# Patient Record
Sex: Female | Born: 1935 | Race: White | Hispanic: No | Smoking: Never smoker
Health system: Southern US, Community
[De-identification: ages and names within clinical notes are randomized; demographics above are authoritative.]

## PROBLEM LIST (undated history)

## (undated) DIAGNOSIS — I350 Nonrheumatic aortic (valve) stenosis: Secondary | ICD-10-CM

## (undated) DIAGNOSIS — R55 Syncope and collapse: Secondary | ICD-10-CM

## (undated) DIAGNOSIS — S72009A Fracture of unspecified part of neck of unspecified femur, initial encounter for closed fracture: Secondary | ICD-10-CM

## (undated) DIAGNOSIS — G609 Hereditary and idiopathic neuropathy, unspecified: Secondary | ICD-10-CM

## (undated) DIAGNOSIS — W19XXXA Unspecified fall, initial encounter: Secondary | ICD-10-CM

## (undated) DIAGNOSIS — F411 Generalized anxiety disorder: Secondary | ICD-10-CM

## (undated) DIAGNOSIS — R001 Bradycardia, unspecified: Secondary | ICD-10-CM

## (undated) DIAGNOSIS — K5900 Constipation, unspecified: Secondary | ICD-10-CM

## (undated) DIAGNOSIS — H547 Unspecified visual loss: Secondary | ICD-10-CM

## (undated) DIAGNOSIS — S72109A Unspecified trochanteric fracture of unspecified femur, initial encounter for closed fracture: Secondary | ICD-10-CM

## (undated) DIAGNOSIS — R5381 Other malaise: Secondary | ICD-10-CM

## (undated) DIAGNOSIS — I6529 Occlusion and stenosis of unspecified carotid artery: Secondary | ICD-10-CM

## (undated) DIAGNOSIS — N390 Urinary tract infection, site not specified: Secondary | ICD-10-CM

## (undated) DIAGNOSIS — F0390 Unspecified dementia without behavioral disturbance: Secondary | ICD-10-CM

## (undated) DIAGNOSIS — R5383 Other fatigue: Secondary | ICD-10-CM

## (undated) DIAGNOSIS — S72112A Displaced fracture of greater trochanter of left femur, initial encounter for closed fracture: Secondary | ICD-10-CM

## (undated) DIAGNOSIS — I10 Essential (primary) hypertension: Secondary | ICD-10-CM

## (undated) DIAGNOSIS — M810 Age-related osteoporosis without current pathological fracture: Secondary | ICD-10-CM

## (undated) DIAGNOSIS — E113419 Type 2 diabetes mellitus with severe nonproliferative diabetic retinopathy with macular edema, unspecified eye: Secondary | ICD-10-CM

## (undated) DIAGNOSIS — K625 Hemorrhage of anus and rectum: Secondary | ICD-10-CM

## (undated) DIAGNOSIS — S72102A Unspecified trochanteric fracture of left femur, initial encounter for closed fracture: Secondary | ICD-10-CM

## (undated) DIAGNOSIS — I4891 Unspecified atrial fibrillation: Secondary | ICD-10-CM

## (undated) DIAGNOSIS — E039 Hypothyroidism, unspecified: Secondary | ICD-10-CM

## (undated) DIAGNOSIS — Z950 Presence of cardiac pacemaker: Secondary | ICD-10-CM

## (undated) DIAGNOSIS — R627 Adult failure to thrive: Secondary | ICD-10-CM

## (undated) DIAGNOSIS — H548 Legal blindness, as defined in USA: Secondary | ICD-10-CM

## (undated) DIAGNOSIS — Z9181 History of falling: Secondary | ICD-10-CM

## (undated) DIAGNOSIS — R142 Eructation: Secondary | ICD-10-CM

## (undated) DIAGNOSIS — E785 Hyperlipidemia, unspecified: Secondary | ICD-10-CM

## (undated) DIAGNOSIS — F329 Major depressive disorder, single episode, unspecified: Secondary | ICD-10-CM

## (undated) DIAGNOSIS — K117 Disturbances of salivary secretion: Secondary | ICD-10-CM

## (undated) DIAGNOSIS — I495 Sick sinus syndrome: Secondary | ICD-10-CM

## (undated) DIAGNOSIS — S32009A Unspecified fracture of unspecified lumbar vertebra, initial encounter for closed fracture: Secondary | ICD-10-CM

## (undated) DIAGNOSIS — R634 Abnormal weight loss: Secondary | ICD-10-CM

## (undated) DIAGNOSIS — N6019 Diffuse cystic mastopathy of unspecified breast: Secondary | ICD-10-CM

## (undated) DIAGNOSIS — E507 Other ocular manifestations of vitamin A deficiency: Secondary | ICD-10-CM

## (undated) DIAGNOSIS — I341 Nonrheumatic mitral (valve) prolapse: Secondary | ICD-10-CM

## (undated) DIAGNOSIS — R143 Flatulence: Secondary | ICD-10-CM

## (undated) DIAGNOSIS — R269 Unspecified abnormalities of gait and mobility: Secondary | ICD-10-CM

## (undated) DIAGNOSIS — H3530 Unspecified macular degeneration: Secondary | ICD-10-CM

## (undated) DIAGNOSIS — R609 Edema, unspecified: Secondary | ICD-10-CM

## (undated) DIAGNOSIS — R32 Unspecified urinary incontinence: Secondary | ICD-10-CM

## (undated) DIAGNOSIS — R141 Gas pain: Secondary | ICD-10-CM

## (undated) HISTORY — DX: Unspecified urinary incontinence: R32

## (undated) HISTORY — DX: Bradycardia, unspecified: R00.1

## (undated) HISTORY — DX: Syncope and collapse: R55

## (undated) HISTORY — DX: Major depressive disorder, single episode, unspecified: F32.9

## (undated) HISTORY — DX: Unspecified abnormalities of gait and mobility: R26.9

## (undated) HISTORY — DX: Unspecified atrial fibrillation: I48.91

## (undated) HISTORY — DX: Displaced fracture of greater trochanter of left femur, initial encounter for closed fracture: S72.112A

## (undated) HISTORY — DX: Hyperlipidemia, unspecified: E78.5

## (undated) HISTORY — DX: Disturbances of salivary secretion: K11.7

## (undated) HISTORY — PX: TONSILLECTOMY: SHX5217

## (undated) HISTORY — DX: Unspecified dementia without behavioral disturbance: F03.90

## (undated) HISTORY — DX: Hypothyroidism, unspecified: E03.9

## (undated) HISTORY — DX: Other ocular manifestations of vitamin A deficiency: E50.7

## (undated) HISTORY — DX: Unspecified visual loss: H54.7

## (undated) HISTORY — DX: History of falling: Z91.81

## (undated) HISTORY — DX: Occlusion and stenosis of unspecified carotid artery: I65.29

## (undated) HISTORY — DX: Edema, unspecified: R60.9

## (undated) HISTORY — DX: Hemorrhage of anus and rectum: K62.5

## (undated) HISTORY — DX: Unspecified trochanteric fracture of left femur, initial encounter for closed fracture: S72.102A

## (undated) HISTORY — DX: Unspecified macular degeneration: H35.30

## (undated) HISTORY — DX: Diffuse cystic mastopathy of unspecified breast: N60.19

## (undated) HISTORY — DX: Other malaise: R53.81

## (undated) HISTORY — DX: Unspecified fall, initial encounter: W19.XXXA

## (undated) HISTORY — DX: Legal blindness, as defined in USA: H54.8

## (undated) HISTORY — DX: Unspecified fracture of unspecified lumbar vertebra, initial encounter for closed fracture: S32.009A

## (undated) HISTORY — DX: Other fatigue: R53.83

## (undated) HISTORY — DX: Sick sinus syndrome: I49.5

## (undated) HISTORY — DX: Essential (primary) hypertension: I10

## (undated) HISTORY — DX: Presence of cardiac pacemaker: Z95.0

## (undated) HISTORY — DX: Generalized anxiety disorder: F41.1

## (undated) HISTORY — DX: Abnormal weight loss: R63.4

## (undated) HISTORY — DX: Unspecified trochanteric fracture of unspecified femur, initial encounter for closed fracture: S72.109A

## (undated) HISTORY — DX: Gas pain: R14.1

## (undated) HISTORY — DX: Adult failure to thrive: R62.7

## (undated) HISTORY — DX: Type 2 diabetes mellitus with severe nonproliferative diabetic retinopathy with macular edema, unspecified eye: E11.3419

## (undated) HISTORY — DX: Urinary tract infection, site not specified: N39.0

## (undated) HISTORY — DX: Flatulence: R14.3

## (undated) HISTORY — DX: Eructation: R14.2

## (undated) HISTORY — DX: Age-related osteoporosis without current pathological fracture: M81.0

## (undated) HISTORY — DX: Nonrheumatic aortic (valve) stenosis: I35.0

## (undated) HISTORY — DX: Nonrheumatic mitral (valve) prolapse: I34.1

## (undated) HISTORY — DX: Constipation, unspecified: K59.00

## (undated) HISTORY — DX: Hereditary and idiopathic neuropathy, unspecified: G60.9

## (undated) HISTORY — DX: Fracture of unspecified part of neck of unspecified femur, initial encounter for closed fracture: S72.009A

---

## 1962-08-01 HISTORY — PX: TOTAL ABDOMINAL HYSTERECTOMY W/ BILATERAL SALPINGOOPHORECTOMY: SHX83

## 1968-08-01 HISTORY — PX: BREAST BIOPSY: SHX20

## 1999-03-20 DIAGNOSIS — H548 Legal blindness, as defined in USA: Secondary | ICD-10-CM

## 1999-03-20 HISTORY — DX: Legal blindness, as defined in USA: H54.8

## 2000-12-13 DIAGNOSIS — M810 Age-related osteoporosis without current pathological fracture: Secondary | ICD-10-CM

## 2000-12-13 HISTORY — DX: Age-related osteoporosis without current pathological fracture: M81.0

## 2001-12-12 DIAGNOSIS — H3530 Unspecified macular degeneration: Secondary | ICD-10-CM

## 2001-12-12 HISTORY — DX: Unspecified macular degeneration: H35.30

## 2002-11-05 ENCOUNTER — Emergency Department (HOSPITAL_COMMUNITY): Admission: EM | Admit: 2002-11-05 | Discharge: 2002-11-05 | Payer: Self-pay | Admitting: Emergency Medicine

## 2002-11-05 ENCOUNTER — Encounter: Payer: Self-pay | Admitting: Emergency Medicine

## 2003-08-02 HISTORY — PX: OTHER SURGICAL HISTORY: SHX169

## 2003-08-08 ENCOUNTER — Inpatient Hospital Stay (HOSPITAL_COMMUNITY): Admission: EM | Admit: 2003-08-08 | Discharge: 2003-08-13 | Payer: Self-pay | Admitting: Emergency Medicine

## 2003-08-18 DIAGNOSIS — R609 Edema, unspecified: Secondary | ICD-10-CM

## 2003-08-18 HISTORY — DX: Edema, unspecified: R60.9

## 2003-09-08 DIAGNOSIS — R269 Unspecified abnormalities of gait and mobility: Secondary | ICD-10-CM

## 2003-09-08 HISTORY — DX: Unspecified abnormalities of gait and mobility: R26.9

## 2004-06-10 ENCOUNTER — Ambulatory Visit: Payer: Self-pay | Admitting: Cardiology

## 2004-08-01 HISTORY — PX: PACEMAKER INSERTION: SHX728

## 2004-08-05 ENCOUNTER — Ambulatory Visit: Payer: Self-pay | Admitting: Internal Medicine

## 2004-08-18 ENCOUNTER — Ambulatory Visit (HOSPITAL_COMMUNITY): Admission: RE | Admit: 2004-08-18 | Discharge: 2004-08-19 | Payer: Self-pay | Admitting: Internal Medicine

## 2004-08-18 ENCOUNTER — Ambulatory Visit: Payer: Self-pay | Admitting: Internal Medicine

## 2004-09-01 ENCOUNTER — Ambulatory Visit: Payer: Self-pay | Admitting: Internal Medicine

## 2004-09-01 ENCOUNTER — Ambulatory Visit: Payer: Self-pay

## 2004-09-12 DIAGNOSIS — Z950 Presence of cardiac pacemaker: Secondary | ICD-10-CM

## 2004-09-12 HISTORY — DX: Presence of cardiac pacemaker: Z95.0

## 2004-12-08 ENCOUNTER — Ambulatory Visit: Payer: Self-pay | Admitting: Internal Medicine

## 2005-02-22 DIAGNOSIS — R32 Unspecified urinary incontinence: Secondary | ICD-10-CM

## 2005-02-22 HISTORY — DX: Unspecified urinary incontinence: R32

## 2005-09-08 ENCOUNTER — Ambulatory Visit: Payer: Self-pay

## 2005-09-26 ENCOUNTER — Ambulatory Visit: Payer: Self-pay | Admitting: Internal Medicine

## 2005-09-29 HISTORY — PX: OTHER SURGICAL HISTORY: SHX169

## 2005-10-29 ENCOUNTER — Inpatient Hospital Stay (HOSPITAL_COMMUNITY): Admission: EM | Admit: 2005-10-29 | Discharge: 2005-11-02 | Payer: Self-pay | Admitting: Emergency Medicine

## 2007-05-16 ENCOUNTER — Ambulatory Visit: Payer: Self-pay | Admitting: Internal Medicine

## 2007-09-13 ENCOUNTER — Ambulatory Visit: Payer: Self-pay

## 2007-10-19 ENCOUNTER — Ambulatory Visit: Payer: Self-pay | Admitting: Gastroenterology

## 2007-10-29 ENCOUNTER — Ambulatory Visit: Payer: Self-pay | Admitting: Gastroenterology

## 2007-10-29 LAB — CONVERTED CEMR LAB
ALT: 21 U/L
AST: 29 U/L
Albumin: 3.9 g/dL
Alkaline Phosphatase: 64 U/L
BUN: 16 mg/dL
Basophils Absolute: 0.1 K/uL
Basophils Relative: 0.7 %
Bilirubin, Direct: 0.1 mg/dL
CO2: 33 meq/L — ABNORMAL HIGH
Calcium: 9.5 mg/dL
Chloride: 103 meq/L
Creatinine, Ser: 0.8 mg/dL
Eosinophils Absolute: 0.2 K/uL
Eosinophils Relative: 2.1 %
GFR calc Af Amer: 87 mL/min
GFR calc non Af Amer: 72 mL/min
Glucose, Bld: 155 mg/dL — ABNORMAL HIGH
HCT: 45.1 %
Hemoglobin: 14.7 g/dL
Lymphocytes Relative: 20.8 %
MCHC: 32.6 g/dL
MCV: 90.1 fL
Monocytes Absolute: 0.5 K/uL
Monocytes Relative: 6.2 %
Neutro Abs: 5.1 K/uL
Neutrophils Relative %: 70.2 %
Platelets: 277 K/uL
Potassium: 3.7 meq/L
RBC: 5 M/uL
RDW: 11.8 %
Sodium: 143 meq/L
Total Bilirubin: 0.9 mg/dL
Total Protein: 7.2 g/dL
WBC: 7.4 10*3/microliter

## 2007-11-14 ENCOUNTER — Ambulatory Visit: Payer: Self-pay | Admitting: Gastroenterology

## 2007-11-28 DIAGNOSIS — R269 Unspecified abnormalities of gait and mobility: Secondary | ICD-10-CM

## 2007-12-06 DIAGNOSIS — R5381 Other malaise: Secondary | ICD-10-CM

## 2007-12-06 HISTORY — DX: Other malaise: R53.81

## 2007-12-12 ENCOUNTER — Ambulatory Visit: Payer: Self-pay | Admitting: Internal Medicine

## 2007-12-19 DIAGNOSIS — K649 Unspecified hemorrhoids: Secondary | ICD-10-CM | POA: Insufficient documentation

## 2007-12-19 DIAGNOSIS — E039 Hypothyroidism, unspecified: Secondary | ICD-10-CM

## 2007-12-19 DIAGNOSIS — E113419 Type 2 diabetes mellitus with severe nonproliferative diabetic retinopathy with macular edema, unspecified eye: Secondary | ICD-10-CM

## 2007-12-19 DIAGNOSIS — S72009A Fracture of unspecified part of neck of unspecified femur, initial encounter for closed fracture: Secondary | ICD-10-CM | POA: Insufficient documentation

## 2007-12-19 DIAGNOSIS — E785 Hyperlipidemia, unspecified: Secondary | ICD-10-CM | POA: Insufficient documentation

## 2007-12-19 DIAGNOSIS — I10 Essential (primary) hypertension: Secondary | ICD-10-CM

## 2007-12-19 DIAGNOSIS — H547 Unspecified visual loss: Secondary | ICD-10-CM

## 2007-12-19 HISTORY — DX: Type 2 diabetes mellitus with severe nonproliferative diabetic retinopathy with macular edema, unspecified eye: E11.3419

## 2007-12-19 HISTORY — DX: Hypothyroidism, unspecified: E03.9

## 2007-12-21 ENCOUNTER — Encounter (INDEPENDENT_AMBULATORY_CARE_PROVIDER_SITE_OTHER): Payer: Self-pay | Admitting: Emergency Medicine

## 2007-12-21 ENCOUNTER — Ambulatory Visit: Payer: Self-pay | Admitting: Surgery

## 2007-12-21 ENCOUNTER — Ambulatory Visit: Payer: Self-pay | Admitting: Gastroenterology

## 2007-12-21 ENCOUNTER — Ambulatory Visit: Payer: Self-pay | Admitting: Internal Medicine

## 2007-12-21 ENCOUNTER — Inpatient Hospital Stay (HOSPITAL_COMMUNITY): Admission: EM | Admit: 2007-12-21 | Discharge: 2007-12-23 | Payer: Self-pay | Admitting: Emergency Medicine

## 2007-12-21 DIAGNOSIS — K625 Hemorrhage of anus and rectum: Secondary | ICD-10-CM

## 2007-12-25 ENCOUNTER — Telehealth (INDEPENDENT_AMBULATORY_CARE_PROVIDER_SITE_OTHER): Payer: Self-pay | Admitting: *Deleted

## 2007-12-27 DIAGNOSIS — R55 Syncope and collapse: Secondary | ICD-10-CM

## 2007-12-27 DIAGNOSIS — I6529 Occlusion and stenosis of unspecified carotid artery: Secondary | ICD-10-CM

## 2007-12-27 HISTORY — DX: Syncope and collapse: R55

## 2007-12-27 HISTORY — DX: Occlusion and stenosis of unspecified carotid artery: I65.29

## 2007-12-31 ENCOUNTER — Telehealth: Payer: Self-pay | Admitting: Gastroenterology

## 2008-01-22 ENCOUNTER — Ambulatory Visit: Payer: Self-pay | Admitting: Gastroenterology

## 2008-03-12 ENCOUNTER — Ambulatory Visit: Payer: Self-pay | Admitting: Internal Medicine

## 2008-06-11 ENCOUNTER — Ambulatory Visit: Payer: Self-pay | Admitting: Internal Medicine

## 2008-09-12 ENCOUNTER — Ambulatory Visit: Payer: Self-pay | Admitting: Internal Medicine

## 2008-11-07 ENCOUNTER — Encounter: Payer: Self-pay | Admitting: Internal Medicine

## 2008-12-10 ENCOUNTER — Ambulatory Visit: Payer: Self-pay | Admitting: Internal Medicine

## 2008-12-19 ENCOUNTER — Encounter: Payer: Self-pay | Admitting: Internal Medicine

## 2009-01-20 ENCOUNTER — Encounter: Payer: Self-pay | Admitting: Gastroenterology

## 2009-01-29 DIAGNOSIS — Z9181 History of falling: Secondary | ICD-10-CM

## 2009-01-29 HISTORY — DX: History of falling: Z91.81

## 2009-03-11 ENCOUNTER — Ambulatory Visit: Payer: Self-pay | Admitting: Internal Medicine

## 2009-03-16 ENCOUNTER — Encounter: Payer: Self-pay | Admitting: Internal Medicine

## 2009-06-10 ENCOUNTER — Ambulatory Visit: Payer: Self-pay | Admitting: Internal Medicine

## 2009-06-24 DIAGNOSIS — S32009A Unspecified fracture of unspecified lumbar vertebra, initial encounter for closed fracture: Secondary | ICD-10-CM

## 2009-06-24 HISTORY — DX: Unspecified fracture of unspecified lumbar vertebra, initial encounter for closed fracture: S32.009A

## 2009-06-26 ENCOUNTER — Encounter: Payer: Self-pay | Admitting: Internal Medicine

## 2009-07-13 ENCOUNTER — Encounter: Payer: Self-pay | Admitting: Internal Medicine

## 2009-07-14 ENCOUNTER — Ambulatory Visit: Payer: Self-pay | Admitting: Internal Medicine

## 2009-07-14 DIAGNOSIS — I4891 Unspecified atrial fibrillation: Secondary | ICD-10-CM

## 2009-07-14 HISTORY — DX: Unspecified atrial fibrillation: I48.91

## 2009-09-01 ENCOUNTER — Telehealth: Payer: Self-pay | Admitting: Gastroenterology

## 2009-09-04 ENCOUNTER — Telehealth: Payer: Self-pay | Admitting: Gastroenterology

## 2009-10-14 ENCOUNTER — Ambulatory Visit: Payer: Self-pay | Admitting: Internal Medicine

## 2009-10-27 ENCOUNTER — Encounter: Payer: Self-pay | Admitting: Internal Medicine

## 2009-12-03 ENCOUNTER — Encounter: Payer: Self-pay | Admitting: Gastroenterology

## 2009-12-03 DIAGNOSIS — R141 Gas pain: Secondary | ICD-10-CM

## 2009-12-03 HISTORY — DX: Gas pain: R14.1

## 2009-12-23 DIAGNOSIS — N6019 Diffuse cystic mastopathy of unspecified breast: Secondary | ICD-10-CM

## 2009-12-23 HISTORY — DX: Diffuse cystic mastopathy of unspecified breast: N60.19

## 2010-01-13 ENCOUNTER — Ambulatory Visit: Payer: Self-pay | Admitting: Internal Medicine

## 2010-02-11 ENCOUNTER — Encounter: Payer: Self-pay | Admitting: Internal Medicine

## 2010-03-18 DIAGNOSIS — E039 Hypothyroidism, unspecified: Secondary | ICD-10-CM

## 2010-03-18 HISTORY — DX: Hypothyroidism, unspecified: E03.9

## 2010-04-15 ENCOUNTER — Ambulatory Visit: Payer: Self-pay | Admitting: Internal Medicine

## 2010-05-12 ENCOUNTER — Encounter: Payer: Self-pay | Admitting: Internal Medicine

## 2010-07-22 ENCOUNTER — Ambulatory Visit: Payer: Self-pay | Admitting: Internal Medicine

## 2010-07-22 ENCOUNTER — Encounter: Payer: Self-pay | Admitting: Internal Medicine

## 2010-08-06 ENCOUNTER — Ambulatory Visit
Admission: RE | Admit: 2010-08-06 | Discharge: 2010-08-06 | Payer: Self-pay | Source: Home / Self Care | Attending: Gastroenterology | Admitting: Gastroenterology

## 2010-08-19 DIAGNOSIS — F411 Generalized anxiety disorder: Secondary | ICD-10-CM

## 2010-08-19 HISTORY — DX: Generalized anxiety disorder: F41.1

## 2010-08-24 ENCOUNTER — Encounter
Admission: RE | Admit: 2010-08-24 | Discharge: 2010-08-24 | Payer: Self-pay | Source: Home / Self Care | Attending: Internal Medicine | Admitting: Internal Medicine

## 2010-08-31 NOTE — Progress Notes (Signed)
Summary: Medication  Medications Added ANALPRAM-HC 1-2.5 % CREA (HYDROCORTISONE ACE-PRAMOXINE) apply 1-2 times per day to rectum       Phone Note Call from Patient Call back at Home Phone [phone]   Caller: Patient Call For: Dr. [name] Reason for Call: Talk to Nurse Summary of Call: Pt did not want the Hydrocortisone that was called in. She is wanting Analpram for her hemorroids. Initial call taken by: Karna Christmas,  September 04, 2009 3:37 PM  Follow-up for Phone Call        analpram sent pt aware Follow-up by: Chales Abrahams CMA Duncan Dull),  September 04, 2009 3:49 PM    New/Updated Medications: ANALPRAM-HC 1-2.5 % CREA (HYDROCORTISONE ACE-PRAMOXINE) apply 1-2 times per day to rectum Prescriptions: ANALPRAM-HC 1-2.5 % CREA (HYDROCORTISONE ACE-PRAMOXINE) apply 1-2 times per day to rectum  #1 tube x 6   Entered by:   Chales Abrahams CMA (AAMA)   Authorized by:   Rachael Fee MD   Signed by:   Chales Abrahams CMA (AAMA) on 09/04/2009   Method used:   Electronically to        [facility]* (retail)       [address]       [place], [place]  [id number]       Ph: [phone]       Fax: [phone]   RxID:   [id number]

## 2010-08-31 NOTE — Letter (Signed)
Summary: [facility]   Imported By: Lanelle Bal 12/11/2009 09:04:37  _____________________________________________________________________  External Attachment:    Type:   Image     Comment:   External Document

## 2010-08-31 NOTE — Letter (Signed)
Summary: Remote Device Check  [facility], Main Office  [address]   [place], [place] [address]   Phone: [phone]  Fax: [phone]     October 27, 2009 MRN: [id number]   [facility] [address] [place], [place]  [address]   Dear Ms. Tantillo,   Your remote transmission was recieved and reviewed by your physician.  All diagnostics were within normal limits for you.  __X___Your next transmission is scheduled for:  January 13, 2010.  Please transmit at any time this day.  If you have a wireless device your transmission will be sent automatically.     Sincerely,  Proofreader

## 2010-08-31 NOTE — Progress Notes (Signed)
Summary: Medication Refill   Phone Note Call from Patient Call back at Home Phone [phone]   Caller: Patient Call For: Dr. [name] Reason for Call: Refill Medication Summary of Call: Pt is needing her Analpram refilled Initial call taken by: Karna Christmas,  September 01, 2009 10:37 AM    Prescriptions: HYDROCORTISONE 2.5 % RECTAL CREA (HYDROCORTISONE) apply to rectum 2 times daily  #30 gm x 2   Entered by:   Chales Abrahams CMA (AAMA)   Authorized by:   Rachael Fee MD   Signed by:   Chales Abrahams CMA (AAMA) on 09/01/2009   Method used:   Electronically to        [facility]* (retail)       [address]       [place], [place]  [id number]       Ph: [phone]       Fax: [phone]   RxID:   [id number]

## 2010-08-31 NOTE — Letter (Signed)
Summary: Remote Device Check  [facility], Main Office  [address]   [place], [place] [address]   Phone: [phone]  Fax: [phone]     May 12, 2010 MRN: [id number]   [facility] [place] [address] [place], [place]  [address]   Dear Ms. Cotugno,   Your remote transmission was recieved and reviewed by your physician.  All diagnostics were within normal limits for you.   __X____Your next office visit is scheduled for:  December 2011 with Dr [name]. Please call our office to schedule an appointment.    Sincerely,  Vella Kohler

## 2010-08-31 NOTE — Cardiovascular Report (Signed)
Summary: Office Visit Remote   Office Visit Remote   Imported By: Roderic Ovens 05/13/2010 11:04:00  _____________________________________________________________________  External Attachment:    Type:   Image     Comment:   External Document

## 2010-08-31 NOTE — Cardiovascular Report (Signed)
Summary: Office Visit Remote   Office Visit Remote   Imported By: Roderic Ovens 02/15/2010 12:29:03  _____________________________________________________________________  External Attachment:    Type:   Image     Comment:   External Document

## 2010-08-31 NOTE — Cardiovascular Report (Signed)
Summary: Office Visit Remote   Office Visit Remote   Imported By: Roderic Ovens 10/29/2009 11:51:13  _____________________________________________________________________  External Attachment:    Type:   Image     Comment:   External Document

## 2010-08-31 NOTE — Letter (Signed)
Summary: Remote Device Check  [facility], Main Office  [address]   [place], [place] [address]   Phone: [phone]  Fax: [phone]     February 11, 2010 MRN: [id number]   [facility] [address] [place], [place]  [address]   Dear Ms. Doolen,   Your remote transmission was recieved and reviewed by your physician.  All diagnostics were within normal limits for you.  __X___Your next transmission is scheduled for:  04-15-2010.  Please transmit at any time this day.  If you have a wireless device your transmission will be sent automatically.    Sincerely,  Vella Kohler

## 2010-09-02 NOTE — Assessment & Plan Note (Signed)
Review of gastrointestinal problems: 1. 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 Requesting Provider: n/a Chief Complaint: abdominal pain & bloating History of Present Illness:     very pleasant 77 year old woman whom I last saw 2 1/2 years ago.    she fell backwards 2-3 nights ago, had a knot on her back.  Put ice on it.  she has intermittent pains in epig and ruq.  Also has a lot of flatus.  She stopped citrucel, changed to miralax.  Then tried gas-ex.  She normally eats a lot of fruits, veggies, milk.  a little cheese and milk, not much bread.  the bulk of her diet, calories is from fruit and vegetables  No diarrhea.  no bleeding.  no weight changes.           Current Medications (verified): 1)  Adult Aspirin Low Strength 81 Mg  Tbdp (Aspirin) .Marland Kitchen.. 1 Once Daily 2)  Zocor 10 Mg Tabs (Simvastatin) .... Take One Tablet Once Daily 3)  Toprol Xl 50 Mg Xr24h-Tab (Metoprolol Succinate) .... Take One Tablet Every Morning 4)  Multi-Vitamin Menopausal  Tabs (Multiple Vitamins-Minerals) .... Once Daily 5)  Milk of Magnesia 400 Mg/41ml Susp (Magnesium Hydroxide) .... 30 Ml Every Other Day As Needed 6)  Promensil 500 Mg Tabs (Red Clover Leaf Extract) .... 200mg  Two Times A Day 7)  Vitacel  Tabs (Multiple Vitamins-Minerals) 8)  Miralax  Powd (Polyethylene Glycol 3350) .... As Needed 9)  Preservision Areds  Caps (Multiple Vitamins-Minerals) .... Two Times A Day 10)  Black Cohosh 540 Mg Caps (Black Cohosh) .... Once Daily 11)  Klb6 3.5-25-100-40 Mg Caps (Pyridoxine-Kelp-Lect-Vinegar) .... 3 Tablets Two Times A Day 12)  Soy Protein Shake  Powd (Nutritional Supplements) .... Once Daily 13)  Trust [employer] Dha 29-1-200 & 250 Mg Misc (Prenat-Febis-Fepro-Fa-Ca-Omega) .... Once Daily 14)  Levothroid 25 Mcg Tabs (Levothyroxine  Sodium) .... 1/2 Tablet Once Daily 15)  Oxybutynin Chloride 5 Mg Tabs (Oxybutynin Chloride) .... Once Daily  Allergies: 1)  ! Pcn 2)  ! Ibuprofen 3)  ! * Llisinopril  Vital Signs:  Patient profile:   77 year old female Height:      61 inches Weight:      145 pounds BMI:     27.50 Pulse rate:   84 / minute Pulse rhythm:   regular BP sitting:   126 / 60  (left arm) Cuff size:   regular  Vitals Entered By: June McMurray CMA Duncan Dull) (August 06, 2010 1:52 PM)  Physical Exam  Additional Exam:  Constitutional: generally well appearing Psychiatric: alert and oriented times 3 Abdomen: soft, non-tender, non-distended, normal bowel sounds    Impression & Recommendations:  Problem # 1:  excessive flatulence, intermittent abdominal pains the bulk of her diet is fruits and vegetables. These are much more gas-forming then other types of food. We went through her usual daily diet and I recommended she try cutting out her usual morning prunes and prune juice. She will call the office in 2-3 weeks to report on her symptoms  Patient Instructions: 1)  Cut out prunes and prune juice from your morning meal.  Replace this with more toast in the morning. 2)  Call Dr. [name]' office in 2 weeks to report on your symptoms. 3)  The medication list was reviewed and reconciled.  All changed /  newly prescribed medications were explained.  A complete medication list was provided to the patient / caregiver.

## 2010-09-02 NOTE — Assessment & Plan Note (Signed)
Summary: F1Y MEDTRONIC  Medications Added MILK OF MAGNESIA 400 MG/5ML SUSP (MAGNESIUM HYDROXIDE) 30 ml every other day as needed PROMENSIL 500 MG TABS (RED CLOVER LEAF EXTRACT) 200mg  two times a day VITACEL  TABS (MULTIPLE VITAMINS-MINERALS)  MIRALAX  POWD (POLYETHYLENE GLYCOL 3350) as needed PRESERVISION AREDS  CAPS (MULTIPLE VITAMINS-MINERALS) two times a day BLACK COHOSH 540 MG CAPS (BLACK COHOSH) once daily KLB6 3.5-25-100-40 MG CAPS (PYRIDOXINE-KELP-LECT-VINEGAR) 3 tablets two times a day SOY PROTEIN SHAKE  POWD (NUTRITIONAL SUPPLEMENTS) once daily TRUST NATAL DHA 29-1-200 & 250 MG MISC (PRENAT-FEBIS-FEPRO-FA-CA-OMEGA) once daily LEVOTHROID 25 MCG TABS (LEVOTHYROXINE SODIUM) 1/2 tablet once daily OXYBUTYNIN CHLORIDE 5 MG TABS (OXYBUTYNIN CHLORIDE) once daily      Allergies Added:    Visit Type:  medtronic Primary Provider:  Murray Hodgkins, M.D.  CC:  shortness of breath and some dizziness.  History of Present Illness:  .  [name] is seen in followup for sinus node dysfunction chronotropic incompetence which has a pacemaker implanted.  She is doing quite well    She does have a history of a cryptogenic stroke, she does have atri al fibrillation identified by her device..  She also has macular degeneration and she is concerned about the potential of anticoagulation to affect her vision  Anticoagulation Management History:      Positive risk factors for bleeding include an age of 75 years or older and presence of serious comorbidities.  The bleeding index is 'intermediate risk'.  Positive CHADS2 values include History of HTN, Age > 75 years old, and History of Diabetes.    Problems Prior to Update: 1)  Atrial Fibrillation  (ICD-427.31) 2)  Sinus Node Dysfunction  (ICD-427.81) 3)  Pacemaker, Permanent-medtronic In-pulse E2dr01  (ICD-V45.01) 4)  Hypertension  (ICD-401.9) 5)  Other and Unspecified Hyperlipidemia  (ICD-272.4) 6)  Dm  (ICD-250.00) 7)  Unspecified  Hypothyroidism  (ICD-244.9) 8)  Hip Fracture  (ICD-820.8) 9)  Hemorrhoids  (ICD-455.6) 10)  Rectal Bleeding  (ICD-569.3)  Current Medications (verified): 1)  Adult Aspirin Low Strength 81 Mg  Tbdp (Aspirin) .Marland Kitchen.. 1 Once Daily 2)  Zocor 10 Mg Tabs (Simvastatin) .... Take One Tablet Once Daily 3)  Toprol Xl 50 Mg Xr24h-Tab (Metoprolol Succinate) .... Take One Tablet Every Morning 4)  Multi-Vitamin Menopausal  Tabs (Multiple Vitamins-Minerals) .... Once Daily 5)  Milk of Magnesia 400 Mg/14ml Susp (Magnesium Hydroxide) .... 30 Ml Every Other Day As Needed 6)  Promensil 500 Mg Tabs (Red Clover Leaf Extract) .... 200mg  Two Times A Day 7)  Vitacel  Tabs (Multiple Vitamins-Minerals) 8)  Miralax  Powd (Polyethylene Glycol 3350) .... As Needed 9)  Preservision Areds  Caps (Multiple Vitamins-Minerals) .... Two Times A Day 10)  Black Cohosh 540 Mg Caps (Black Cohosh) .... Once Daily 11)  Klb6 3.5-25-100-40 Mg Caps (Pyridoxine-Kelp-Lect-Vinegar) .... 3 Tablets Two Times A Day 12)  Soy Protein Shake  Powd (Nutritional Supplements) .... Once Daily 13)  [employer] Dha 29-1-200 & 250 Mg Misc (Prenat-Febis-Fepro-Fa-Ca-Omega) .... Once Daily 14)  Levothroid 25 Mcg Tabs (Levothyroxine Sodium) .... 1/2 Tablet Once Daily 15)  Oxybutynin Chloride 5 Mg Tabs (Oxybutynin Chloride) .... Once Daily  Allergies (verified): 1)  ! Pcn 2)  ! 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  Alcohol Use - no Drug Use  - no  Risk Factors: Smoking Status: never (07/13/2009)  Vital Signs:  Patient profile:   77 year old female Height:      61 inches Weight:      139.25 pounds BMI:     26.41 Pulse rate:   88 / minute BP sitting:   179 / 76  (left arm) Cuff size:   regular  Vitals Entered By: Caralee Ates CMA (July 22, 2010 11:45 AM)  Physical Exam  General:  The patient was alert and oriented in no acute distress. HEENT Normal.  Neck veins were flat, carotids were brisk.  Lungs were clear.  Heart sounds were regular without murmurs or gallops.  Abdomen was soft with active bowel sounds. There is no clubbing cyanosis or edema. Skin Warm and dry \\par   EKG  Procedure date:  07/22/2010  Findings:      atrial paced at 60 Intervals 0.21/0.13/0.44 Right bundle branch block Otherwise normal  PPM Specifications Following MD:  Sherryl Manges, MD     PPM Vendor:  Medtronic     PPM Model Number:  [id number]     PPM Serial Number:  [id number] PPM DOI:  08/18/2004     PPM Implanting MD:  Sherryl Manges, MD  Lead 1    Location: RA     DOI: 08/18/2004     Model #: [id number]     Serial #: [id number]     Status: active Lead 2    Location: RV     DOI: 08/18/2004     Model #: 1646T     Serial #: [id number]     Status: active  Magnet Response Rate:  BOL 85 ERI 65  Indications:  SINUS NODE DYSFUNCTION   PPM Follow Up Remote Check?  No Battery Voltage:  2.77 V     Battery Est. Longevity:  5 YEARS     Pacer Dependent:  No       PPM Device Measurements Atrium  Amplitude: 1.4 mV, Impedance: 537 ohms, Threshold: 0.375 V at 0.4 msec Right Ventricle  Amplitude: 11.20 mV, Impedance: 703 ohms, Threshold: 0.625 V at 0.4 msec  Episodes MS Episodes:  3     Percent Mode Switch:  <0.1%     Coumadin:  No Atrial Pacing:  96.2%     Ventricular Pacing:  0.3%  Parameters Mode:  DDDR     Lower Rate Limit:  60     Upper Rate Limit:  110 Paced AV Delay:  280     Sensed AV Delay:  230 Next Remote Date:  10/21/2010     Next  Cardiology Appt Due:  07/02/2011 Tech Comments:  No parameter changes. Device function normal.  3 mode switch episode with the longest 2:29 hours.  Carelink transmissions every 3 months.   ROV 1 year with Dr. [name]. Altha Harm, LPN  July 22, 2010 12:12 PM   Impression & Recommendations:  Problem # 1:  ATRIAL FIBRILLATION (ICD-427.31) thethe patient had intermittent atrial fibrillation. She has high CHADS VASC score. Her concern has been any potential deleterious impact of anticoagulation on her vision which is quite poor. I have asked her to discuss with her ophthalmologist next month his risk assessment of anticoagulation. We will make the month after that to review this.  I have noted to her that aspirin is of little benefit in the elderly this regard  and at oral anticoagulation with warfarin would be preferred from an arrhythmia point of view Her updated medication list for this problem includes:    Adult Aspirin Low Strength 81 Mg Tbdp (Aspirin) .Marland Kitchen... 1 once daily    Toprol Xl 50 Mg Xr24h-tab (Metoprolol succinate) .Marland Kitchen... Take one tablet every morning  Problem # 2:  SINUS NODE DYSFUNCTION (ICD-427.81) stable post pacing Her updated medication list for this problem includes:    Adult Aspirin Low Strength 81 Mg Tbdp (Aspirin) .Marland Kitchen... 1 once daily    Toprol Xl 50 Mg Xr24h-tab (Metoprolol succinate) .Marland Kitchen... Take one tablet every morning  Problem # 3:  HYPERTENSION (ICD-401.9) modestly elevated. We'll hold off on further medications at this point however as she be followed weekly by Dr. Chilton Si The following medications were removed from the medication list:    Hydrochlorothiazide 25 Mg Tabs (Hydrochlorothiazide) .Marland Kitchen... 1 once daily Her updated medication list for this problem includes:    Adult Aspirin Low Strength 81 Mg Tbdp (Aspirin) .Marland Kitchen... 1 once daily    Toprol Xl 50 Mg Xr24h-tab (Metoprolol succinate) .Marland Kitchen... Take one tablet every morning  Problem # 4:  PACEMAKER, PERMANENT-[employer]TR[employer]IC  IN-PULSE E2DR01 (ICD-V45.01) Device parameters and data were reviewed and no changes were made  Anticoagulation Management Assessment/Plan:            Patient Instructions: 1)  Your physician recommends that you continue on your current medications as directed. Please refer to the Current Medication list given to you today. 2)  Your physician recommends that you schedule a follow-up appointment in: 2 months to discuss Coumadin

## 2010-09-27 ENCOUNTER — Encounter: Payer: Self-pay | Admitting: Internal Medicine

## 2010-09-27 ENCOUNTER — Encounter (INDEPENDENT_AMBULATORY_CARE_PROVIDER_SITE_OTHER): Payer: Medicare Other | Admitting: Internal Medicine

## 2010-09-27 DIAGNOSIS — I495 Sick sinus syndrome: Secondary | ICD-10-CM

## 2010-09-27 DIAGNOSIS — I4891 Unspecified atrial fibrillation: Secondary | ICD-10-CM

## 2010-09-27 DIAGNOSIS — I10 Essential (primary) hypertension: Secondary | ICD-10-CM

## 2010-10-07 ENCOUNTER — Telehealth: Payer: Self-pay | Admitting: Internal Medicine

## 2010-10-07 NOTE — Cardiovascular Report (Signed)
Summary: Office Visit   Office Visit   Imported By: Roderic Ovens 09/30/2010 16:10:30  _____________________________________________________________________  External Attachment:    Type:   Image     Comment:   External Document

## 2010-10-07 NOTE — Assessment & Plan Note (Signed)
Summary: f54m  Medications Added FISH OIL   OIL (FISH OIL) once daily SERTRALINE HCL 25 MG TABS (SERTRALINE HCL) once daily CLINDAMYCIN HCL 150 MG CAPS (CLINDAMYCIN HCL) 4 caps prior to dental appts      Allergies Added:   Visit Type:  Follow-up Primary Provider:  Murray Hodgkins, M.D.  CC:  no complaints.  History of Present Illness:  .  [name] is seen in followup for sinus node dysfunction chronotropic incompetence which has a pacemaker implanted.  She is doing quite well    She does have a history of a cryptogenic stroke, she does have atri al fibrillation identified by her device..  She also has macular degeneration and she is concerned about the potential of anticoagulation to affect her vision; she spoke with her ophthalmologist who felt that anticoagulation would put her other eye at significantly increased risk of loss of vision. She is blind in one eye  and her good eye is 20/800;  She had a fall a few weeks ago. She underwent scanning at the hospital that was apparently normal  Current Medications (verified): 1)  Adult Aspirin Low Strength 81 Mg  Tbdp (Aspirin) .Marland Kitchen.. 1 Once Daily 2)  Zocor 10 Mg Tabs (Simvastatin) .... Take One Tablet Once Daily 3)  Toprol Xl 50 Mg Xr24h-Tab (Metoprolol Succinate) .... Take One Tablet Every Morning 4)  Milk of Magnesia 400 Mg/77ml Susp (Magnesium Hydroxide) .... 30 Ml Every Other Day As Needed 5)  Promensil 500 Mg Tabs (Red Clover Leaf Extract) .... 200mg  Two Times A Day 6)  Vitacel  Tabs (Multiple Vitamins-Minerals) 7)  Miralax  Powd (Polyethylene Glycol 3350) .... As Needed 8)  Preservision Areds  Caps (Multiple Vitamins-Minerals) .... Two Times A Day 9)  Black Cohosh 540 Mg Caps (Black Cohosh) .... Once Daily 10)  Klb6 3.5-25-100-40 Mg Caps (Pyridoxine-Kelp-Lect-Vinegar) .... 3 Tablets Two Times A Day 11)  Soy Protein Shake  Powd (Nutritional Supplements) .... Once Daily 12)  Levothroid 25 Mcg Tabs (Levothyroxine Sodium) .... 1/2  Tablet Once Daily 13)  Oxybutynin Chloride 5 Mg Tabs (Oxybutynin Chloride) .... Once Daily 14)  Fish Oil   Oil (Fish Oil) .... Once Daily 15)  Sertraline Hcl 25 Mg Tabs (Sertraline Hcl) .... Once Daily 16)  Clindamycin Hcl 150 Mg Caps (Clindamycin Hcl) .... 4 Caps Prior To Dental Appts  Allergies (verified): 1)  ! Pcn 2)  ! Ibuprofen 3)  ! * Llisinopril  Vital Signs:  Patient profile:   77 year old female Height:      61 inches Weight:      136 pounds BMI:     25.79 Pulse rate:   80 / minute BP sitting:   174 / 66  (left arm) Cuff size:   regular  Vitals Entered By: Hardin Negus, RMA (September 27, 2010 10:10 AM)  Physical Exam  General:  The patient was alert and oriented in no acute distress. HEENT Normal.   Lungs were clear.  Heart sounds were regular without murmurs or gallops.  Abdomen was soft  There is no clubbing cyanosis or edema. Skin Warm and dry    PPM Specifications Following MD:  Sherryl Manges, MD     PPM Vendor:  Medtronic     PPM Model Number:  [id number]     PPM Serial Number:  [id number] PPM DOI:  08/18/2004     PPM Implanting MD:  Sherryl Manges, MD  Lead 1    Location: RA     DOI: 08/18/2004  Model #: [id number]     Serial #: [id number]     Status: active Lead 2    Location: RV     DOI: 08/18/2004     Model #: [id number]     Serial #: [id number]     Status: active  Magnet Response Rate:  BOL 85 ERI 65  Indications:  SINUS NODE DYSFUNCTION   PPM Follow Up Pacer Dependent:  No      Episodes Coumadin:  No  Parameters Mode:  DDDR     Lower Rate Limit:  60     Upper Rate Limit:  110 Paced AV Delay:  280     Sensed AV Delay:  230  Impression & Recommendations:  Problem # 1:  ATRIAL FIBRILLATION (ICD-427.31) Patient has had no intercurrent atrial fibrillation. The issue of thromboembolic risk reduction is very challenging given her high risk of thromboembolism but also the high risk to her vision. I think for her the latter is paramount. I would ask her to further  inquire of her ophthalmologist about the potential risks associated with aspirin. Recent data are quite disheartening about the risk benefit of aspirin. Her updated medication list for this problem includes:    Adult Aspirin Low Strength 81 Mg Tbdp (Aspirin) .Marland Kitchen... 1 once daily    Toprol Xl 50 Mg Xr24h-tab (Metoprolol succinate) .Marland Kitchen... Take one tablet every morning  Problem # 2:  PACEMAKER, PERMANENT-MEDTRONIC IN-PULSE E2DR01 (ICD-V45.01) Device parameters and data were reviewed and no changes were made  Problem # 3:  HYPERTENSION (ICD-401.9) her blood pressure is poorly controlled. I've asked her to follow up with Dr. Chilton Si regarding augmented therapy. Her updated medication list for this problem includes:    Adult Aspirin Low Strength 81 Mg Tbdp (Aspirin) .Marland Kitchen... 1 once daily    Toprol Xl 50 Mg Xr24h-tab (Metoprolol succinate) .Marland Kitchen... Take one tablet every morning  Patient Instructions: 1)  Your physician recommends that you continue on your current medications as directed. Please refer to the Current Medication list given to you today. 2)  Your physician wants you to follow-up in:  YEAR WITH DR Despina Hidden will receive a reminder letter in the mail two months in advance. If you don't receive a letter, please call our office to schedule the follow-up appointment.

## 2010-10-19 NOTE — Progress Notes (Signed)
Summary: stop asa   Phone Note Call from Patient Call back at [facility] Phone [phone]   Caller: Patient Reason for Call: Talk to Nurse Summary of Call: can pt stop taken asa.  Initial call taken by: Lorne Skeens,  October 07, 2010 10:58 AM  Follow-up for Phone Call        Ms. Crill spoke with her eye doctor regarding ASA and he said that ASA had only a small effect regarding the scar tissue in her eyes. She is wondering if she needs to continue the baby ASA. Will forward to Dr. [name] to review since they discussed this at her last OV. Whitney Maeola Sarah RN  October 07, 2010 11:14 AM  Follow-up by: Whitney Maeola Sarah RN,  October 07, 2010 11:15 AM  Additional Follow-up for Phone Call Additional follow up Details #1::        if safe to take asa it is our only protection against stroke with her af Additional Follow-up by: Nathen May, MD, [facility],  October 13, 2010 11:33 AM     Appended Document: stop asa PT AWARE./CY

## 2010-12-14 NOTE — Assessment & Plan Note (Signed)
[facility]                         GASTROENTEROLOGY OFFICE NOTE   ZIYAN, HILLMER                        MRN:          [id number]  DATE:10/19/2007                            DOB:          [birth year]    REASON FOR REFERRAL:  Dr. Chilton Si asked me to evaluate Ms. [name] in  consultation regarding recent rectal bleeding, family history of colon  cancer.   HISTORY OF PRESENT ILLNESS:  Ms. Shinsky is a very pleasant 77 year old  woman whose mother had colon cancer.  She herself had a colonoscopy in  the 1980s and tells me that it was normal.  She has not had a  colonoscopy since then.  She has rather chronic constipation and uses  magnesium pills to move her bowels at least every other day.  She  believes if she did not take that she would probably go several days  without moving her bowels.  In the past two to three months she has  noticed red blood on the he tissue paper as well as some spotting of  blood on her underwear.  She discussed this with Dr. Chilton Si who arranged  for her to be evaluated here.  Her last CBC was in October of 2008.  That showed a normal hemoglobin.  Recent basic metabolic profile last  month was normal.   REVIEW OF SYSTEMS:  Notable for stable weight and otherwise is  essentially normal, and is available on our history intake sheet.   PAST MEDICAL HISTORY:  1. Pacemaker.  2. Bilateral hip fractures.  3. Hypothyroidism.  4. Diabetes.  5. Elevated lipids.  6. Hypertension.   CURRENT MEDICATIONS:  Hydrochlorothiazide, aspirin, potassium, vitamin C  and E, PreserVision, lecithin, __________ , calcium, magnesium, enzymes,  B12, folic acid.   ALLERGIES:  PENICILLIN AND IBUPROFEN.   SOCIAL HISTORY:  Widowed.  Lives at extended care facility.  Retired  Insurance account manager.  Nonsmoker, nondrinker.  Two children.   FAMILY HISTORY:  Mother died of colon cancer.   PHYSICAL EXAMINATION:  5 feet 2 inches, 138 pounds.  Blood  pressure  134/62, pulse 68.  CONSTITUTIONAL:  Generally well appearing.  NEUROLOGIC:  Alert and oriented x3.  EYES:  She is legally blind but has good sense of macro vision.  MOUTH:  Oropharynx moist, no lesions.  NECK:  Supple.  No lymphadenopathy.  CARDIOVASCULAR:  Heart regular rate and rhythm.  LUNGS:  Clear to auscultation bilaterally.  ABDOMEN:  Soft, nontender, nondistended.  Normal bowel sounds.  EXTREMITIES:  No lower extremity edema.  SKIN:  No rashes or lesions or visible extremities.   ASSESSMENT/PLAN:  77 year old woman with family history of colon  cancer, recent rectal bleeding.   First, I think it is reasonable we proceed with a full colonoscopy at  her soonest convenience.  She does not appear to be anemic clinically,  but I will arrange for her also to have a CBC done to check to see if  that is indeed the case as she has not had a CBC since she began  bleeding.  I see no reason for  any further imaging studies or labs other  than that mentioned above.     Rachael Fee, MD  Electronically Signed    DPJ/MedQ  DD: 10/19/2007  DT: 10/19/2007  Job #: [id number]   cc:   [name], M.D.

## 2010-12-14 NOTE — Assessment & Plan Note (Signed)
[facility]                            CARDIOLOGY OFFICE NOTE   NAME:Farley, Crystal HOFFMEISTER                        MRN:          [id number]  DATE:05/16/2007                            DOB:          1915/07/25    [name] is status post pacemaker implantation for bradycardia and  chronotropic incompetence.  Her major complaint today is shortness of  breath with exertion, which has been stable or so over the last year or  two.  It is unaccompanied by chest pain.  She also has episodes where  she becomes abruptly dyspneic.  This lasts for just a second or two and  then passes.   Her medications are reviewed, and they are unchanged.   PHYSICAL EXAMINATION:  Blood pressure 145/59.  LUNGS:  Clear.  CARDIAC:  Heart sounds were regular.  EXTREMITIES:  Without edema.  SKIN:  Warm and dry.   Interrogation of her Medtronic pulse generator demonstrates a P wave of  1.4 with an impedance of 561, a threshold of 0.37 at 0.4.  The R wave  was 11 with an impedance of 764 and a threshold of 0.5 at 0.4.  She does  have approximately 0.5%  PVCs.   IMPRESSION:  1. Sinus node dysfunction with chronotropic incompetence.  2. Status post pacemaker for the above.  3. Dyspneic episodes that are brief, likely related to the premature      ventricular contractions.  4. Macular degeneration.   [name] is stable.  We will plan to see her again in one year's time.     Duke Salvia, MD, [facility]  Electronically Signed    SCK/MedQ  DD: 05/16/2007  DT: 05/17/2007  Job #: [id number]

## 2010-12-14 NOTE — Discharge Summary (Signed)
[name], [name]                  ACCOUNT NO.:  [id number]   MEDICAL RECORD NO.:  [id number]          PATIENT TYPE:  INP   LOCATION:  3703                         FACILITY:  [facility]   PHYSICIAN:  [name], M.D.    DATE OF BIRTH:  01-17-1915   DATE OF ADMISSION:  12/21/2007  DATE OF DISCHARGE:  12/23/2007                               DISCHARGE SUMMARY   DISCHARGE DIAGNOSES:  1. Right-sided paresthesias and presyncope.  2. Mild carotid artery stenosis with 40-60% proximal right internal      carotid artery stenosis and loss of diastolic flow of the right      vertebral artery per carotid Doppler study.  No internal carotid      artery stenosis on the left.  3. Uncontrolled hypertension.  4. Hyperlipidemia.  5. History of bradycardia, status post pacemaker in January 2006.  6. Limited code blue.   MEDICATIONS:  1. Zocor 5 mg q.h.s.  2. Norvasc 2.5 mg q.h.s.  3. Toprol-XL 25 mg each morning.  4. Hydrochlorothiazide 25 mg each morning.  5. Potassium chloride 20 mEq daily.  6. Multivitamin with iron once daily.  7. Calcium with vitamin D one tablet b.i.d.  8. Aspirin 81 mg daily.   CONSULTATIONS:  Pacemaker technician.   PROCEDURE PERFORMED:  1. Dual-chamber pacemaker check on Dec 21, 2007.  All program values      within normal limits.  2. CT scan of the head without contrast on Dec 21, 2007.  The results      revealed no intracranial hemorrhage.  Small-vessel-disease type      changes.  No CT evidence of large acute infarct.  Opacification of      the right sphenoid sinus air cell with air-fluid level, possibly      representing acute sinusitis.  3. Chest x-ray on Dec 21, 2007.  The results revealed no acute      cardiopulmonary process.  4. A 2-D echocardiogram performed on Dec 21, 2007.  The results      revealed an ejection fraction estimated to be 60-70%.  No      abnormalities with the exception of mild tricuspid valvular      regurgitation.  5. Carotid Doppler  study on Dec 21, 2007.  The results revealed      bilateral mild heterogeneous plaque noted at the bifurcation      extending into the proximal ICA and ECA.  Right 40-60% proximal ICA      stenosis.  Bilateral tortuosity of the mid to distal ICA and ECA.      Loss of diastolic flow in the right vertebral artery consistent      with a more distal obstruction.  Vertebral artery flow antegrade      bilaterally.  No significant left ICA stenosis noted.   HISTORY OF PRESENT ILLNESS:  The patient is a 77 year old woman with a  past medical history significant for status post pacemaker,  hypertension, hyperlipidemia, and legal blindness secondary to macular  degeneration.  She presented to the emergency department with a chief  complaint of right-sided  numbness, particularly of the face and arm, and  feelings as if she was going to pass out.  This episode occurred as she  was leaving Dr. Christella Farley' office.  It lasted for a few minutes.  Apparently, one of the nurses took her blood pressure during this  episode and the systolic blood pressure was above 190.  When the patient  presented to the emergency department, she was alert and oriented and  neurologically intact.  Her blood pressure was 169/66.  Her EKG revealed  normal sinus rhythm with a heart rate of 60 beats per minute and a right  bundle branch block.  The CT scan of her head revealed no acute  intracranial abnormalities.  The patient was admitted for further  evaluation and management.   For additional details, please see the dictated history and physical.   HOSPITAL COURSE:  1. Right-sided paresthesias/presyncope.  The patient was      neurologically intact at the time of the initial hospital      assessment.  She was hemodynamically stable although she was mildly      to moderately hypertensive.  The CT scan of her head revealed no      acute intracranial findings.  However, there was evidence of      chronic sinusitis.  The patient  gave no complaints of facial pain,      rhinorrhea, or subjective fever and chills.  Her urinalysis      revealed a few wbc's.  However, the patient had no complaints of      painful urination or urinary hesitancy/frequency.  The patient was      restarted on aspirin at 81 mg daily as well as hydrochlorothiazide      and potassium supplementation.  For further evaluation, a 2-D      echocardiogram, carotid Dopplers, TSH, vitamin B12, cardiac      enzymes, and a fasting lipid panel were ordered.  The patient's 2-D      echocardiogram revealed preserved LV function and no significant      abnormalities.  Her carotid Doppler study revealed 40-60% stenosis      of the right ICA and a loss of diastolic flow in the right      vertebral artery.  There was no significant ICA stenosis on the      left.  Her cardiac enzymes were within normal limits.  Her TSH was      within normal limits at 2.507.  Her vitamin B12 was greater than      2000.  Her fasting lipid panel revealed a total cholesterol of 179,      triglycerides of 205, HDL of 36, and LDL of 102.  The patient was      started on Zocor 5 mg each night.  The results of the studies were      discussed with the patient and the patient's son.  The patient was      given the option of medical treatment for the mild carotid stenosis      versus further evaluation with a vascular surgeon.  The patient did      not want aggressive intervention, particularly surgery and/or a      carotid endarterectomy given her age.  I agreed with the patient in      that it does not appear that there is an immediate intervention      needed beyond medical therapy.  The patient is treated chronically  with aspirin.  Starting Plavix was entertained.  However, it was      felt that adding Zocor and attempting to control the patient's      blood pressure better would probably decrease her symptoms and      improve mortality.  Because the patient's blood pressure  was      somewhat uncontrolled during the hospitalization, Toprol-XL and      Norvasc were added.  The patient was evaluated by the physical and      occupational therapists.  On the second hospital day, when she      ambulated with the therapists it was noted that her systolic blood      pressure went up to approximately 200.  The patient became      symptomatic, complaining of right arm numbness.  When she sat down,      her blood pressure was rechecked and it was recorded as 151/64.      Her symptoms quickly resolved when she sat down.  Orthostatic vital      signs were ordered and revealed that the patient was not      orthostatic.  It is unclear whether or not the hypertension with      ambulation is the cause or the effect of the patient's right-sided      paresthesias.  Nevertheless, the patient's hypertension was a      concern.  As stated above, Toprol-XL and Norvasc were added to      hydrochlorothiazide for better blood pressure management.  Today,      the patient ambulated with the nursing staff.  She had no      complaints of dizziness, right-sided facial numbness, or right arm      numbness.  Her blood pressure today following ambulation was      154/71.  The patient will therefore be discharged to home on a      combination of Zocor, hydrochlorothiazide, Toprol-XL, Norvasc, and      potassium chloride supplementation.  The decision to start Plavix      will be deferred to Dr. Chilton Si.  Of note, the patient was advised to      maintain antiplatelet therapy with aspirin.  2. Status post pacemaker in January 2006.  The patient's pacemaker was      interrogated.  There were no      abnormalities.  The patient's heart rate ranged from 60-86 beats      per minute during the hospital course.  3. Diet-controlled diabetes mellitus.  The patient's capillary blood      glucose was well controlled during the hospital course.  Her      hemoglobin A1c was 6.1.      [name],  M.D.  Electronically Signed     DF/MEDQ  D:  12/23/2007  T:  12/23/2007  Job:  [id number]   cc:   [name], M.D.  Duke Salvia, MD, [facility]

## 2010-12-14 NOTE — H&P (Signed)
[name], MONTANTE NO.:  [id number]   MEDICAL RECORD NO.:  [id number]          PATIENT TYPE:  EMS   LOCATION:  MAJO                         FACILITY:  [facility]   PHYSICIAN:  Elliot Cousin, M.D.    DATE OF BIRTH:  1915/07/16   DATE OF ADMISSION:  12/21/2007  DATE OF DISCHARGE:                              HISTORY & PHYSICAL   PRIMARY CARE PHYSICIAN:  [name], MD.   PRIMARY CARDIOLOGIST:  Duke Salvia, MD.   CHIEF COMPLAINT:  Right-sided facial numbness, right sided arm numbness,  I almost passed out.   HISTORY OF PRESENT ILLNESS:  The patient is a 77 year old woman with a  past medical history significant for hypertension, hyperlipidemia and  status post pacemaker.  She was visiting Dr. [name] today for follow-up  of a recent colonoscopy, which apparently revealed internal and external  hemorrhoids.  The patient was in her usual state of health; however,  when she left his office to walk down the hall to leave, she felt  strange.  The right side of her face became numb, followed by right arm  numbness.  She had no numbness of her right leg or the left side of her  body.  She began to walk; however, she thought that she was going to  pass out.  Her daughter-in-law was close by and as the patient was  beginning to sit in a chair, she almost fell.  The patient's daughter-in-  law braced the patient's fall and was able to get her into the chair  without any trauma.  There was no complete loss of consciousness.  The  patient says that she was aware of her surroundings at the time;  however, she just felt strange.  The patient also states that over the  past 4-5 weeks she has had a number of episodes when she felt as if she  was going to pass out but she did not.  She became lightheaded and off  balanced transiently became more alert and stronger shortly thereafter.  She has had a mild left-sided intermittent headache which she describes  as no big deal.   She is legally blind; however, she has not had any  worsening vision and no double vision.  She denies outright weakness on  the right side.  She denies any bladder incontinence or fecal  incontinence.  She denies any recent palpitations, chest pain or  shortness of breath.  She has not had any nausea or vomiting, fever,  chills or abdominal pain.   During the evaluation in the emergency department the patient is noted  to be hemodynamically stable although her blood pressure is moderately  elevated at 169/66.  CT scan of her head reveals no acute intracranial  findings although there is evidence of opacification of the right  sphenoid sinus.  Her lab data are virtually unremarkable.  Her EKG  reveals normal sinus rhythm with a heart rate of 60 beats per minute and  a right bundle branch block.  The patient will be admitted for further  evaluation and management.  PAST MEDICAL HISTORY:  1. Rectal bleeding secondary to internal and external hemorrhoids per      colonoscopy in April 2009 by Dr. [name].  2. Status post pacemaker in January 2006 by Dr. [name] secondary to      bradycardia.  3. Hypertension.  4. Hyperlipidemia (no pharmacological treatment yet).  5. History of mitral valve prolapse.  6. Prediabetes.  7. Legal blindness secondary to macular degeneration.  8. Hearing loss.  The patient wears hearing aids bilaterally.  9. Status post ORIF of the left hip in March 2007 by Dr. [name].  10.Status post right hip surgery in January 2005 by Dr. [name].  11.Status post hysterectomy and bilateral salpingo-oophorectomy at 77      years of age.   MEDICATIONS:  1. Hydrochlorothiazide 25 mg daily.  2. Potassium chloride (unknown dose) daily.  3. Multivitamin once daily.  4. Gingko supplement once daily.  5. Lecithin supplement once daily.  6. Red clover supplement once daily.  7. Black cohosh supplement once daily.  8. Cod liver oil once or twice daily.  9. Calcium with  vitamin D twice daily.  10.Aspirin 81 mg daily.   ALLERGIES:  The patient has allergies to IBUPROFEN and PENICILLINS.   SOCIAL HISTORY:  The patient is widowed.  She lives at [facility]  [place] (independent living).  She ambulates with a walker.  She has  two children.  She is a retired Runner, broadcasting/film/video.  She has no history of tobacco  or alcohol use.   FAMILY HISTORY:  Noncontributory secondary to her age.   REVIEW OF SYSTEMS:  The patient's review of systems is positive for  occasional lightheadedness as if she is going to pass out, difficulty  hearing, difficulty seeing, constipation.  Otherwise review of systems  is negative.   EXAM:  Temperature 97.7, blood pressure 169/66, pulse 69, respiratory  rate 18, oxygen saturation 94% on room air.  GENERAL:  The patient is a pleasant, alert, youthful-appearing 77-year-  old Caucasian woman who is currently sitting up in bed in no acute  distress.  HEENT:  Head is normocephalic, nontraumatic.  Pupils equal, round and  reactive to light.  Extraocular muscles are intact.  Conjunctivae are  clear.  Sclerae are white.  Tympanic membranes not examined.  There are,  however, bilateral hearing aids, not removed.  Nasal mucosa is dry.  No  sinus tenderness.  Oropharynx reveals fairly good dentition.  Mucous  membranes are mildly dry.  No posterior exudates or erythema.  NECK:  Supple.  No adenopathy, no thyromegaly, no bruit,  no JVD.  LUNGS:  Clear to auscultation bilaterally.  HEART:  S1 and S2 with a 2/6 systolic murmur.  CHEST WALL:  There is a palpable left upper chest wall pacemaker,  nontender.  ABDOMEN:  Positive bowel sounds, soft, nontender, nondistended.  No  hepatosplenomegaly, no masses palpated.  EXTREMITIES:  Pedal pulses 2+ bilaterally.  No pretibial edema and no  pedal edema.  GU:  Deferred.  RECTAL:  Deferred.  NEUROLOGIC:  The patient is alert and oriented x3.  Cranial nerves II-  XII are intact with the exception of a  decrease in hearing acuity and a  decrease in gross vision secondary to macular degeneration.  Gag reflex  is present.  Facial sensation is intact.  No facial droop.  Strength in  the sitting position is 5/5 of the upper and lower extremities  symmetrically.  No pronator drift.  Plantar reflexes questionable  downgoing bilaterally.  Sensation is grossly intact of the upper and  lower extremities.  Cerebellar with finger-to-nose intact.  Gait not  assessed.   ADMISSION LABORATORY DATA:  EKG reveals normal sinus rhythm with a heart  rate of 60 beats per minute and a right bundle branch block.  Question  presence of pacemaker spikes.  CT scan of the head reveals no  intracranial hemorrhage.  Small vessel disease-type changes.  No CT  evidence of large acute infarct.  Opacification of the right sphenoid  sinus air cell with air fluid-level, possibly representing acute  sinusitis.  Chest x-ray reveals no acute cardiopulmonary process.  Urinalysis negative except small leukocytes.  Sodium 140, potassium 3.6,  chloride 101, CO2 31, glucose 142, BUN 17, creatinine 0.73, total  bilirubin 0.4, alkaline phosphatase 78, SGOT 26, SGPT 19, total protein  6.8, albumin 3.8, calcium 9.5.  PTT 26, PT 12.4.  WBC 8.1, hemoglobin  14.1, platelets 268.   ASSESSMENT:  1. Right-sided paresthesias associated with presyncope.  The      differential diagnoses include transient ischemic attack, acute      resolving stroke, arrhythmia, orthostatic hypotension.  2. Mild pyuria without dysuria.  The patient has no complaints of      painful urination.  She is currently afebrile and her white blood      cell count is within normal limits.  3. Possible right sphenoid sinusitis.  The patient denies facial pain,      rhinorrhea or fever and chills.  Therefore, antibiotic therapy will      not be started.  4. Status post pacemaker secondary to bradycardia.  I do not detect      pacemaker spikes; however, Dr. [name] or  his colleagues will be      consulted to evaluate the patient's pacemaker.  5. Hypertension.  The patient's blood pressure is moderately elevated.      She is chronically treated with hydrochlorothiazide.  Would rather      monitor the patient's blood pressure over the next 24-48 hours      before modifying antihypertensive therapy.  6. Prediabetes.  The patient is treated with diet and lifestyle      changes alone.  Her venous glucose is currently 142.   PLAN:  1. The patient will be admitted for further evaluation and management.  2. Cannot obtain an MRI of the brain because of the pacemaker.      Therefore, will check bilateral carotid Dopplers, cardiac enzymes      and a 2-D echocardiogram.  3. We will also assess the patient's fasting lipid profile, thyroid      function and vitamin B12.  4. We will check orthostatic vital signs.  5. We will ask [facility] to assess the patient's pacemaker.  6. We will continue hydrochlorothiazide and potassium chloride.  We      will watch the patient's blood pressure for the next 1-2 days and      the assess whether or not she needs additional treatment.  7. We will start gentle IV fluids.      Elliot Cousin, M.D.  Electronically Signed     DF/MEDQ  D:  12/21/2007  T:  12/21/2007  Job:  [id number]   cc:   [name], M.D.  Rachael Fee, MD  Duke Salvia, MD, [facility]

## 2010-12-14 NOTE — Assessment & Plan Note (Signed)
[facility]                         ELECTROPHYSIOLOGY OFFICE NOTE   DAYLEN, HACK                        MRN:          [id number]  DATE:09/12/2008                            DOB:          [birth year]    Ms. [name] is seen in followup for bradycardia and chronotropic  incompetence.  She is status post pacemaker implantation.  Her ER  intercurrently has been notable for stroke that occurred last summer.  Interestingly, she has had no episodes of atrial fibrillation on her  pacemaker interrogation.  She was noted to be hypertensive and her  medications were adjusted (see below).  It was attributed to a right-  sided ICA stenosis in the setting of a left-sided cerebral perfusion  defects.  There are some discordance there.  I am not quite sure  understand the mechanism of her stroke.   She had no complaints of chest pain or shortness of breath.  She is  tired.  She has significant postprandial tiredness; this is particularly  after lunch which is a warm meal.   MEDICATIONS:  1. Hydrochlorothiazide 25.  2. Aspirin 81.  3. Potassium.  4. Amlodipine 2.5.  5. Metoprolol 25.  6. Simvastatin 10.   On examination, her blood pressure is 145/60 with a pulse of 69.  Her  weight was 134 which is stable.  Her lungs were clear.  Her heart sounds  were regular.  The abdomen was soft.  The extremities had no edema.   Interrogation of her Medtronic pulse generator demonstrates no atrial  fibrillation as noted.  She is 95% atrial paced.  Her P-wave was 1 with  impedance of 470, threshold 0.375 at 0.4.  The R-wave was 8 with  impendence of 766, a threshold of 0.65 at 0.4.  The battery voltage was  2.78.   IMPRESSION:  1. Sinus node dysfunction.  2. Status post pacemaker for the above.  3. Intercurrent stroke, question mechanism.  4. Hypertension with multiple medications.   I have taken the liberty of discontinuing her amlodipine and doubling  her  metoprolol.  We will get a prescription to her at [facility].  In the  event that she notices more fatigue, we can switch over the metoprolol  back to amlodipine at 5 and see how she does on that regime.   As relates to her postprandial sleepiness, this is to be related to  increase splanchnic blood flow.   I have just told her to be careful after meals.     Duke Salvia, MD, [facility]  Electronically Signed    SCK/MedQ  DD: 09/12/2008  DT: 09/13/2008  Job #: [id number]   cc:   [name], M.D.

## 2010-12-15 ENCOUNTER — Telehealth: Payer: Self-pay | Admitting: Internal Medicine

## 2010-12-17 NOTE — Discharge Summary (Signed)
NAMETIMOTHY, TOWNSEL NO.:  [id number]   MEDICAL RECORD NO.:  [id number]                   PATIENT TYPE:  INP   LOCATION:  0480                                 FACILITY:  [facility]   PHYSICIAN:  Elana Alm. [name], M.D.              DATE OF BIRTH:  [birth year]   DATE OF ADMISSION:  08/08/2003  DATE OF DISCHARGE:  08/12/2003                                 DISCHARGE SUMMARY   ADMISSION DIAGNOSES:  1. Right hip femoral neck fracture.  2. Uncontrolled hypertension.  3. Heart murmur.  4. Diet controlled diabetes.  5. Macular degeneration.   DISCHARGE DIAGNOSES:  1. Right hip femoral neck fracture status post right hemiarthroplasty.  2. Hypertension now under better control.  3. Heart murmur.  4. Diet controlled diabetes.  5. Uncontrolled macular degeneration with dry eyes.   HISTORY OF PRESENT ILLNESS:  The patient is an 77 year old female who is an  independent living resident of [facility]. She fell today at home  when the rubber of her shoe got caught on her carpet. She denies any loss of  consciousness, any dizziness and just complains of right hip pain.  Brought  to [facility] Emergency Room where x-rays showed right hip femoral neck  fracture.  Her blood pressure on admission was 220/80, after pain medicine  it was 197/78.  Dr. Elliot Gurney was consulted for medical management  and medical clearance before surgery.  He started her on Toprol XL 50 mg x1  dose and then clonidine 0.1 mg.  On August 08, 2002 the patient underwent a  right hip hemiarthroplasty bipolar prosthesis placement by Dr. [name], she  tolerated the procedure well.  Postoperatively she had a significant high  fever, temperature of 101, blood cultures were drawn, a UA was done and  chest x-ray was done.  Chest x-ray was clear, UA was clear and her blood  cultures still have no growth as of this date but they are still growing.  Physical therapy day one she was 2 plus  maximum assist bed to chair.  Postop  day two, we started Vicodin 5/500, 1 tablet q. 6h. scheduled. She was  significantly better on physical therapy postop day two ambulating 20 feet,  minimal to moderate assist. She still spiked a temperature of 102.  Her  surgical wound is well approximated, she had a small amount of serous  drainage.  Postoperative day three, the patient had been afebrile x24 hours.  Her blood pressure was 138/56, respirations 20, pulse 69.  Her CBG's had  been running between 116 and 227.  Her INR is 1.9.  She is being transferred  to the Skilled Nursing Facility at [facility] today in stable  condition.   DISCHARGE MEDICATIONS:  1. Toprol XL 50 mg, 1 tablet 1 time a day.  2. Colace 100 mg 1 tablet twice a day.  3. Senokot  1 tablet twice a day.  4. Trinsicon 1 caplet t.i.d.  5. Coumadin 5 mg alternating by 2.5 mg.  She had 2.5 mg on August 11, 2003.  6. She will discontinue her Lovenox today as she is almost therapeutic on     her Coumadin.  7. She is getting Vicodin 5/500 one tablet scheduled q. 6h.  She also can     get one tablet q. 6h. p.r.n. if she needs a second Vicodin to control her     pain.   She will need hemoglobin A1C to evaluate her diabetes. She will need CBG's  a.c. and h.s. to monitor her blood sugar to see if she needs p.o. medication  for this.  She will need to see Molly Maduro A. [name], M.D. back in the office on  August 25, 2003. Please call [phone] today to scheduled that appointment.  She is weightbearing as tolerated.  She needs to be on a diabetic diet.  She  needs to get physical therapy daily for ambulation. She needs to get  occupational therapy for bathing and dressing.  She needs to have daily  dressing changes, swab the wound with Betadine each day and apply dry 4 x  4's taped in place.  She will need to get her eye drops from her independent  living apartment so that she can have some moisture in her eyes so her  macular  degeneration does not bother her as much.     Kirstin Shepperson, P.A.                  Robert A. [name], M.D.    KS/MEDQ  D:  08/12/2003  T:  08/12/2003  Job:  [id number]   cc:   Lenon Curt Chilton Si, M.D.  [address]  [place]  [place] [address]  Fax: [phone]   [facility]

## 2010-12-17 NOTE — Discharge Summary (Signed)
[name], [name]                  ACCOUNT NO.:  [id number]   MEDICAL RECORD NO.:  [id number]          PATIENT TYPE:  OIB   LOCATION:  6531                         FACILITY:  [facility]   PHYSICIAN:  Duke Salvia, M.D.  DATE OF BIRTH:  1915-02-21   DATE OF ADMISSION:  08/18/2004  DATE OF DISCHARGE:  08/19/2004                                 DISCHARGE SUMMARY   DISCHARGE DIAGNOSES:  1.  Status post implantation of St. Jude EnPulse pacemaker.  2.  Sinus pauses.  3.  Chronotropic incompetence.  4.  Symptomatic premature ventricular contractions.  5.  History of right hip fracture after fall.  6.  Macular degeneration.  7.  Mild hypertension.  8.  Tonsillectomy.  9.  Hysterectomy.  10. Left breast biopsy.  11. Mild hypokalemia on hydrochlorothiazide.   PROCEDURE:  On August 18, 2004, implantation of St. Jude EnPulse pacemaker  for sinus node dysfunction.  The pacemaker has been interrogated post  procedure day #1, all within normal limits.  There is high impedance in the  right ventricular lead of 1049 ohms.  Chest x-ray has been examined.  Leads  are appropriate.  No pneumothorax.  The patient discharging post procedure  day #1.   DISPOSITION:  [name] is ready for discharge post procedure day #1.  She is in a pacing rhythm at the time of discharge, A pacing, V sensing.  She has no complaints.  She is alert and oriented.  She is achieving 96%  oxygen saturations on room air.  Blood pressure is 158/48 this morning.  She  is alert and oriented x3.  Her incision is healing nicely without swelling,  erythema or drainage.  Mobility issues have been discussed with the patient.  She has also been asked to keep her incision dry for the next 7 days.   DISCHARGE MEDICATIONS:  1.  Vitamin C 500 mg daily.  2.  Ocuvite two tablets daily.  3.  Os-Cal with Vitamin D 500 mg daily.  4.  Hydrochlorothiazide 25 mg daily.  5.  Potassium chloride 10 mEq daily.  This is a new medication and  she will      have a prescription for this.  6.  The patient is part of the [facility] trial and she will be seen by [facility]      research associates here at [facility] before discharge.  7.  Tylenol 325 mg one to two tablets every 4-6 hours as needed for pain.   DIET:  Low sodium, low cholesterol diet.   FOLLOW UP:  Follow-up appointment in clinic on Wednesday, September 01, 2004,  at 9:30 a.m.  She sees Dr. [name] on Wednesday, April 26, at 9:30 a.m.   HISTORY OF PRESENT ILLNESS:  The patient is an 77 year old female.  She has  a bradycardia.  This was largely asymptomatic except for exercise  intolerance.  She also may have some orthostatic intolerance.  She had  Holter monitor with a mean heart rate of 49, minimum heart rates in the  waking hours of 40.  There were a  number of pauses in the 2.5-3.0 second  range with occasional PVCs.  She also has an exercise protocol on August 05, 2004, to look at chronotropic incompetence.  She was only able to achieve a  maximum heart rate of 75 beats per minute.  This was a 65% of a chronotropic  minimum.  Now with these chronotropic incompetence and sinus node pauses, it  is thought exercise intolerance may be improved by atrial pacing.  There is  also some benefit that it may prevent pauses which may lead to falls.  The  patient will be electively admitted to [facility] on January 18.   HOSPITAL COURSE:  On admission to [facility], she underwent  successful implantation of St. Jude pacemaker by Dr. [name] that day.  She  has had no post procedure complications.  She was found to be mildly  hypokalemic on admission and started on low dose of potassium chloride.  She  has been given a prescription for this.  The patient discharges on January  19, with medications and followup as dictated.       GM/MEDQ  D:  08/19/2004  T:  08/19/2004  Job:  [id number]   cc:   [name], M.D.  [address]  [place]  [place] [address]  Fax:  [phone]   Jesse Sans. Wall, M.D.

## 2010-12-17 NOTE — Discharge Summary (Signed)
NAME:  [name], [name] NO.:  [id number]   MEDICAL RECORD NO.:  [id number]                   PATIENT TYPE:  INP   LOCATION:  0480                                 FACILITY:  [facility]   PHYSICIAN:  Kirstin Shepperson, P.A.            DATE OF BIRTH:  1914-11-19   DATE OF ADMISSION:  08/08/2003  DATE OF DISCHARGE:                                 DISCHARGE SUMMARY   ADDENDUM TO STAT DISCHARGE #244010:  Discontinue the clonidine and add  Accupril 10 mg 1 p.o. q.d. to her list of medications.                                               Julien Girt, P.A.    KS/MEDQ  D:  08/12/2003  T:  08/12/2003  Job:  [id number]

## 2010-12-17 NOTE — Op Note (Signed)
NAMEMERANDA, DECHAINE NO.:  [id number]   MEDICAL RECORD NO.:  [id number]                   PATIENT TYPE:  INP   LOCATION:  0480                                 FACILITY:  [facility]   PHYSICIAN:  Elana Alm. [name], M.D.              DATE OF BIRTH:  10-22-1914   DATE OF PROCEDURE:  08/09/2003  DATE OF DISCHARGE:                                 OPERATIVE REPORT   PREOPERATIVE DIAGNOSIS:  Right hip femoral neck fracture.   POSTOPERATIVE DIAGNOSIS:  Right hip femoral neck fracture.   PROCEDURE:  Right hip Osteonics bipolar prosthesis using #6 cemented femoral  component with +5 x 26 mm femoral head with 46 mm bipolar cup.   SURGEON:  Elana Alm. [name], M.D.   ASSISTANT:  Julien Girt, P.A.   ANESTHESIA:  General.   OPERATIVE TIME:  One hour.   ESTIMATED BLOOD LOSS:  250 mL.   COMPLICATIONS:  None.   DESCRIPTION OF PROCEDURE:  Ms. Kallstrom was brought to the operating room on  August 09, 2003, placed under general anesthesia on her own bed, and  carefully transferred to the operative table.  She was then placed in the  right lateral up decubitus position and secured on the bed with a Mark  frame.  She received vancomycin 1 g IV preoperatively for prophylaxis.  The  right hip and leg were prepped using sterile Duraprep and draped using  sterile technique.  Initially through a 15 mm posterolateral bridge  trochanteric incision, initial exposure was made.  The underlying  subcutaneous tissues were incised in line with the skin incision.  The  iliotibial band and gluteus maximus fascia was incised longitudinally  revealing the underlying sciatic nerve, which was carefully protected.  The  short external rotators of the hip and hip capsule were released off their  femoral neck insertion and tagged, and then the hip fracture was easily  identified and the femoral head was removed.  There were found to be minimal  degenerative changes.  The femoral head  measured 46 mm in diameter and a 46  mm trial was placed in the acetabulum and found to give an excellent fit.  It was then removed and the femoral neck was cut 1.5-2 cm above the lesser  trochanter in the appropriate amount of anteversion and abduction and  inclination.  Sequential axial reamers were then used to ream the femur up  to a 6.5 with broaches up to a 6.5 and with the #6 broach in place with a +5  neck length and a 46 mm bipolar head trial, the hip was reduced and taken  through a full range of motion and found to be stable and leg lengths equal.  It was then removed.  The cement plug was trialled.  A #3 was found to be  the appropriate trial, and this was then placed.  The femoral canal was then  jet lavage irrigated with 3 L of saline solution and then filled with  cement, and then the #6 femoral component with an 11 mm centralizer was  placed down the femoral canal with excess cement being removed from around  the edges.  After the cement hardened, a +5 x 28 mm head was hammered on to  the femoral neck with an excellent Morse taper fit, and then the 46 mm  bipolar cut was locked into position and then the hip reduced, taken through  a full range of motion and found to be stable and leg lengths equal.  The  wound was then irrigated and then the hip capsule and short external  rotators of the hip were reattached to their femoral neck insertion through  two drill holes in the greater trochanter.  The iliotibial band and gluteus  maximus fascia was closed with #1 Ethibond suture.  Subcutaneous tissue was  closed with 0 and 2-0 Vicryl, skin closed with skin staples, sterile  dressings were applied.  Hip abduction pillow applied.  The patient turned  supine, checked for leg lengths that were equal, rotation was equal,  vascular status was normal.  The patient then awakened, extubated, and taken  to the recovery room in stable condition.  Needle and sponge count was  correct x2 at  the end of the case.                                               Robert A. [name], M.D.    RAW/MEDQ  D:  08/09/2003  T:  08/09/2003  Job:  [id number]

## 2010-12-17 NOTE — Op Note (Signed)
[name], [name]                  ACCOUNT NO.:  [id number]   MEDICAL RECORD NO.:  [id number]          PATIENT TYPE:  OIB   LOCATION:  2899                         FACILITY:  [facility]   PHYSICIAN:  Duke Salvia, M.D.  DATE OF BIRTH:  10/27/1914   DATE OF PROCEDURE:  08/18/2004  DATE OF DISCHARGE:                                 OPERATIVE REPORT   PREOPERATIVE DIAGNOSIS:  Sinus node dysfunction with chronic tropic  incompetence.   POSTOPERATIVE DIAGNOSIS:  Sinus node dysfunction with chronic tropic  incompetence.   PROCEDURE:  Dual-chamber pacemaker implantation with contrast venography.   OPERATOR:  Duke Salvia, M.D.   DESCRIPTION OF PROCEDURE:  Following obtaining informed consent, the patient  was brought to the electrophysiology laboratory and placed on the  fluoroscopic  table in the supine position. After routine prep and drape of the left  upper chest, Lidocaine was infiltrated in the prepectoral, subclavicular  region.  Incision was made and carried down to the layer of the prepectoral  fascia using electrocautery and sharp dissection. A pocket was formed  similarly.  Hemostasis was achieved.   Thereafter, attention was turned to gaining access to the extrathoracic left  subclavian vein. This was accomplished without difficulty without the  aspiration of air or puncture of the artery, a contrast venogram having been  accomplished.  Two separate venepunctures were accomplished,  7 French sheaths were placed, and sequentially, Pacesetter passive fixation  leads, 1646T, 52 cm ventricular lead, serial #[id number], and a 1642, T-46 cm  passive fixation atrial lead, serial #[id number].   Under fluoroscopic guidance, they were manipulated into the right  ventricular apex and the right atrial appendage respectively where a bipolar  R-wave was 9.1 millivolts with a pacing impedence of 786 ohms and a  threshold of 0.5 msec and 0.4 volts. Current threshold was 0.3 MA and there  was no diaphragmatic pacing at 10 volts. The bipolar P wave was 2.7  millivolts, with a pacing impedance of 560 ohms and threshold of 0.5 volts,  and 0.5 msc, and current threshold of 1.0 MA.   With these acceptable parameters recorded, the leads were secured to the  prepectoral fascia and attached to a Medtronic In-Pulse E2DR01 pulse  generator, serial #[id number].  The leads in the pulse generator were then  placed in the pocket, secured to the prepectoral fascia. The pocket was  copiously irrigated with antibiotic containing saline solution.  Hemostasis  was assured.  The wound was closed in 3 layers in normal fashion. The wound  was  washed, dried and a Benzoin and Steri-Strip  dressing was applied.  Sponge, instrument and needle counts were correct at  the end of the procedure according to the staff. The patient tolerated the  procedure without apparent complication.       SCK/MEDQ  D:  08/18/2004  T:  08/18/2004  Job:  [id number]   cc:   electrophysiology laboratory   [facility]   [name], M.D.  [address]  [place]  [place] [address]  Fax: [phone]

## 2010-12-17 NOTE — Discharge Summary (Signed)
[name], [name]                  ACCOUNT NO.:  [id number]   MEDICAL RECORD NO.:  [id number]          PATIENT TYPE:  INP   LOCATION:  1511                         FACILITY:  [facility]   PHYSICIAN:  Dyke Brackett, M.D.    DATE OF BIRTH:  1915-08-01   DATE OF ADMISSION:  10/29/2005  DATE OF DISCHARGE:  11/02/2005                                 DISCHARGE SUMMARY   ADMITTING DIAGNOSES:  1.  Left intertrochanteric femur fracture.  2.  Hypertension.  3.  Mitral valve prolapse.  4.  History of pacemaker placement.  5.  Macular degeneration.  6.  Legally blind.  7.  Pre diabetes mellitus.   DISCHARGE DIAGNOSES:  1.  Status post left hip compression screw.  2.  Hypokalemia, resolved.  3.  Mild leukocytosis, resolved.  Patient afebrile.  4.  Acute blood loss anemia, asymptomatic.  5.  Slow progress with physical therapy.  6.  Macular degeneration.  7.  Legally blind.  8.  Hypertension.  9.  Mild mitral valve prolapse.  10. History of pacemaker placement.  11. Pre diabetes mellitus.   HISTORY OF PRESENT ILLNESS:  [name] is an 77 year old white female who  presented with left hip pain after tripping over a raised log.  Patient  landed on left side.  Denied any chest pain, shortness of breath, loss of  consciousness, or injury to her head.  Patient with immediately onset of  left leg pain.  No hypesthesia.  Patient was brought to [facility] ER where  she was found to have a left intertrochanteric femur fracture.   HOME MEDICATIONS:  1.  Multivitamin one p.o. daily.  2.  Calcium daily.  3.  Potassium 10 mEq p.o. daily.  4.  Cascara Sodara dosage unknown.  5.  Melatonin dosage unknown.  6.  Hydrochlorothiazide 25 mg p.o. daily.   SURGICAL PROCEDURE:  Patient was taken to the operating room on October 29, 2005 by Dr. [name], assisted by Arnoldo Morale, P.A.-C.  Patient was placed  under spinal anesthesia and underwent a left hip compression screw.  Patient  tolerated the procedure  well and returned to recovery in good stable  condition.   CONSULTS:  PT, OT, clinical social work, case management   HOSPITAL COURSE:  Postoperative day one patient afebrile.  Vital signs  stable.  H&H is 11.2 and 32.1.  White count was 13,200.  Patient without  shortness of breath, chest pain, calf pain.  Some left hip pain with  movement.  Postoperative day two patient denies chest pain, shortness of  breath, nausea, vomiting.  Denied any dizziness, lightheadedness.  H&H is  9.8, 29.2.  Tmax 100.8.  Vital signs stable.  White count was 9000.  Very  mild hypokalemia with a potassium of 3.4.  Patient was given 20 mEq of K-  Dur.  Postoperative day three patient without any complaints.  Tmax was  100.5.  Vital signs were stable.  H&H remained stable at 9.7 and 28.4.  Patient had no symptoms of anemia.  White count was 10,800.  Potassium had  dropped to 3.2 and therefore patient was placed on K-Dur 40 mEq p.o. b.i.d.  This was in addition to her regular scheduled potassium replacement.  Patient continued slow progress with physical therapy.  Was at [facility] assist  with transfers and ambulation.  Postoperative day four patient afebrile.  Vital signs stable.  H&H is stable at 10.1 and 29.7.  Patient without  symptoms of anemia.  White count was 9100.  Potassium was 3.9.  Patient  remained mod assist with transfers and ambulation.  Otherwise, patient  without any dizziness, shortness of breath, chest pain.  Only complaint was  left hip pain when out of bed or with any motion of the hip.  It was felt  that the patient could be discharged back to [facility] in stable  condition.   X-rays left hip four views shows acute nondisplaced fracture base of the  left femoral hip intertrochanteric region. One view chest x-ray dated October 29, 2005 showed pacemaker with no acute findings.  Left hip October 29, 2005  four intraoperative spot images showed placement of dynamic hip screw across  the  left femoral neck fracture.  Near anatomic alignment.  EKG on October 29, 2005 showed normal sinus rhythm with a right bundle branch block.  Heart  rate was 78 beats per minute, P-R interval 188 milliseconds, __________.   LABORATORIES:  Routine laboratories on admission:  CBC:  White count was  10,300, hemoglobin 13.9, hematocrit 41.5, platelets were low at 145.  Coags  on admission:  All values within normal limits.  Routine chemistries on  admission:  Sodium was 137, potassium was slightly low at 3.4, chloride 99,  bicarbonate 29, glucose 125 (high), BUN 21, creatinine 0.7, calcium 9.  Hepatic enzymes on admission were all within normal limits.  Urinalysis  dated October 29, 2005 showed small leukocyte esterase with 3-6 white blood  cells, otherwise negative.  Urine culture showed 70,000 colonies per mL,  multiple bacterial morphocytes present.  Suggest recollection if clinically  indicated.   MEDICATIONS ON THE FLOOR:  1.  Hydrochlorothiazide 25 mg p.o. daily.  2.  Multivitamin mineral one capsule p.o. daily.  3.  Lovenox 40 mg subcutaneous q.24h.  Start day was October 30, 2005 and      should be continued for total of 14 days.  4.  Colace 100 mg p.o. b.i.d.  5.  Senokot one tablet p.o. b.i.d. a.c.  6.  Phenergan 12.5-25 mg q.4h. p.o. p.r.n.  7.  Robaxin 500 mg p.o. q.6h. p.r.n. spasm.  8.  Percocet one tablet p.o. q.4-6h. p.r.n. pain.  9.  Tylenol 325-650 mg p.o. q.4h. p.r.n.  10. Reglan 10 mg p.o. q.8h. p.r.n.  11. Potassium chloride 40 mEq p.o. b.i.d., last dose on November 01, 2005.  12. Vitamin C daily.  13. Vitamin E daily.  14. Black cohosh daily.   DISCHARGE INSTRUCTIONS:   MEDICATIONS:  Patient may resume home medications.  Floor medications need  to be adjusted by skilled nursing facility physician.  Patient's PCP is Dr.  Murray Hodgkins.   DIET:  Regular diet.  Advance as tolerated.  WEIGHTBEARING ACTIVITY:  Patient is partial weightbearing 25-50% body weight  left leg  with walker.   SPECIAL INSTRUCTIONS:  Patient needs PT/OT.  Please evaluation and treat.   WOUND CARE:  Patient is to keep wound clean and dry.  Dressings to be  changed daily.  May shower after two days of no drainage from the wound  site.  FOLLOW-UP:  Patient needs follow up with Dr. [name] in the office  approximately 14 days from surgery for staple removal and x-rays.  Please  contact office at [phone] to make follow-up appointment.   SPECIAL INSTRUCTIONS:  Wound have CBC and BMET drawn to follow up on  patient's acute blood loss anemia and hypokalemia.   CONDITION ON DISCHARGE:  Patient was discharged back to skilled nursing  facility in good, stable condition.      Richardean Canal, Arnetha Courser, M.D.  Electronically Signed    GC/MEDQ  D:  11/02/2005  T:  11/02/2005  Job:  [id number]   cc:   [name], M.D.  Fax: [phone]

## 2010-12-17 NOTE — Op Note (Signed)
[name], [name]                  ACCOUNT NO.:  [id number]   MEDICAL RECORD NO.:  [id number]          PATIENT TYPE:  INP   LOCATION:  0102                         FACILITY:  [facility]   PHYSICIAN:  Dyke Brackett, M.D.    DATE OF BIRTH:  June 27, 1915   DATE OF PROCEDURE:  10/29/2005  DATE OF DISCHARGE:                                 OPERATIVE REPORT   PREOPERATIVE DIAGNOSIS:  Left basilar neck/high intertrochanteric hip  fracture.   POSTOPERATIVE DIAGNOSIS:  Left basilar neck/high intertrochanteric hip  fracture.   OPERATION:  Open reduction and internal fixation, left intertrochanteric hip  fracture, with four-hole 135-degree PK-2 DePuy compression screw, 135 degree  angle with 100 mm screw.   SURGEON:  Dyke Brackett, M.D.   ASSISTANT:  Legrand Pitts. Duffy, P.A.   ESTIMATED BLOOD LOSS:  Approximately 200.   DESCRIPTION OF PROCEDURE:  Sterile prep and drape on the fracture table with  slight internal rotation of the leg.  Lateral skin incision, splitting of  the iliotibial band, vastus lateralis, exposure of the vastus ridge,  insertion of a guide pin in the center of the head on the lateral and  slightly inferior on the AP, 135-degree angle.  Bone was actually of  reasonably good quality given the patient's age of 35.  A four-hole side  plate was placed on the side with excellent purchase of three out of the  four screws.  The compression screw was inserted and later removed with good  compression at the fracture site.  The wound was irrigated and closed with 0  Vicryl on the vastus lateralis, iliotibial band, 2-0 Vicryl on the  subcutaneous tissues and skin with a stapling device.  Marcaine without  epinephrine infiltrated in the skin.  A lightly compressive sterile dressing  applied.  Taken to the recovery room in stable condition.      Dyke Brackett, M.D.  Electronically Signed     WDC/MEDQ  D:  10/29/2005  T:  11/01/2005  Job:  [id number]

## 2010-12-30 ENCOUNTER — Other Ambulatory Visit: Payer: Self-pay

## 2010-12-30 ENCOUNTER — Ambulatory Visit (INDEPENDENT_AMBULATORY_CARE_PROVIDER_SITE_OTHER): Payer: Medicare Other | Admitting: *Deleted

## 2010-12-30 DIAGNOSIS — I495 Sick sinus syndrome: Secondary | ICD-10-CM

## 2011-01-05 NOTE — Progress Notes (Signed)
Pacer remote 

## 2011-01-30 ENCOUNTER — Encounter: Payer: Self-pay | Admitting: *Deleted

## 2011-03-31 ENCOUNTER — Other Ambulatory Visit: Payer: Self-pay | Admitting: Internal Medicine

## 2011-03-31 ENCOUNTER — Ambulatory Visit (INDEPENDENT_AMBULATORY_CARE_PROVIDER_SITE_OTHER): Payer: Medicare Other | Admitting: *Deleted

## 2011-03-31 DIAGNOSIS — I495 Sick sinus syndrome: Secondary | ICD-10-CM

## 2011-04-01 LAB — REMOTE PACEMAKER DEVICE
AL IMPEDENCE PM: 526 Ohm
BAMS-0001: 175 {beats}/min
BATTERY VOLTAGE: 2.77 V
RV LEAD AMPLITUDE: 22.4 mv
RV LEAD IMPEDENCE PM: 792 Ohm
VENTRICULAR PACING PM: 0

## 2011-04-15 NOTE — Progress Notes (Signed)
Pacer checked by remote 

## 2011-04-27 LAB — CK TOTAL AND CKMB (NOT AT ARMC)
CK, MB: 1
Relative Index: INVALID
Total CK: 33
Total CK: 35

## 2011-04-27 LAB — APTT: aPTT: 26

## 2011-04-27 LAB — VITAMIN B12: Vitamin B-12: >2000 — ABNORMAL HIGH (ref 211–911)

## 2011-04-27 LAB — COMPREHENSIVE METABOLIC PANEL
ALT: 19
Alkaline Phosphatase: 78
BUN: 17
CO2: 31
Calcium: 9.5
GFR calc non Af Amer: >60
Glucose, Bld: 142 — ABNORMAL HIGH
Potassium: 3.6
Sodium: 140
Total Protein: 6.8

## 2011-04-27 LAB — TROPONIN I: Troponin I: <0.01

## 2011-04-27 LAB — CBC
HCT: 40.8
Hemoglobin: 14.1
MCHC: 34.7
RBC: 4.62
RDW: 12.2

## 2011-04-27 LAB — URINALYSIS, ROUTINE W REFLEX MICROSCOPIC
Bilirubin Urine: NEGATIVE
Glucose, UA: NEGATIVE
Hgb urine dipstick: NEGATIVE
Specific Gravity, Urine: 1.016
pH: 7.5

## 2011-04-27 LAB — DIFFERENTIAL
Lymphs Abs: 1.6
Monocytes Relative: 7
Neutro Abs: 5.8
Neutrophils Relative %: 72

## 2011-04-27 LAB — URINE MICROSCOPIC-ADD ON

## 2011-04-27 LAB — HEMOGLOBIN A1C: Mean Plasma Glucose: 140

## 2011-04-27 LAB — LIPID PANEL
Cholesterol: 179
Total CHOL/HDL Ratio: 5

## 2011-04-27 LAB — URINE CULTURE

## 2011-04-27 LAB — PROTIME-INR: INR: 0.9

## 2011-04-28 ENCOUNTER — Encounter: Payer: Self-pay | Admitting: *Deleted

## 2011-05-02 ENCOUNTER — Other Ambulatory Visit: Payer: Self-pay | Admitting: Gastroenterology

## 2011-05-24 ENCOUNTER — Emergency Department (HOSPITAL_COMMUNITY): Payer: Medicare Other

## 2011-05-24 ENCOUNTER — Inpatient Hospital Stay (HOSPITAL_COMMUNITY)
Admission: EM | Admit: 2011-05-24 | Discharge: 2011-05-30 | DRG: 536 | Disposition: A | Discharge status: Skilled Nursing Facility | Payer: Medicare Other | Attending: Internal Medicine | Admitting: Internal Medicine

## 2011-05-24 DIAGNOSIS — H3530 Unspecified macular degeneration: Secondary | ICD-10-CM | POA: Diagnosis present

## 2011-05-24 DIAGNOSIS — I059 Rheumatic mitral valve disease, unspecified: Secondary | ICD-10-CM | POA: Diagnosis present

## 2011-05-24 DIAGNOSIS — Y9210 Unspecified residential institution as the place of occurrence of the external cause: Secondary | ICD-10-CM | POA: Diagnosis present

## 2011-05-24 DIAGNOSIS — E119 Type 2 diabetes mellitus without complications: Secondary | ICD-10-CM | POA: Diagnosis present

## 2011-05-24 DIAGNOSIS — W010XXA Fall on same level from slipping, tripping and stumbling without subsequent striking against object, initial encounter: Secondary | ICD-10-CM | POA: Diagnosis present

## 2011-05-24 DIAGNOSIS — E876 Hypokalemia: Secondary | ICD-10-CM | POA: Diagnosis present

## 2011-05-24 DIAGNOSIS — S0003XA Contusion of scalp, initial encounter: Secondary | ICD-10-CM | POA: Diagnosis present

## 2011-05-24 DIAGNOSIS — E039 Hypothyroidism, unspecified: Secondary | ICD-10-CM | POA: Diagnosis present

## 2011-05-24 DIAGNOSIS — H548 Legal blindness, as defined in USA: Secondary | ICD-10-CM | POA: Diagnosis present

## 2011-05-24 DIAGNOSIS — Z950 Presence of cardiac pacemaker: Secondary | ICD-10-CM

## 2011-05-24 DIAGNOSIS — I10 Essential (primary) hypertension: Secondary | ICD-10-CM | POA: Diagnosis present

## 2011-05-24 DIAGNOSIS — Z66 Do not resuscitate: Secondary | ICD-10-CM | POA: Diagnosis present

## 2011-05-24 DIAGNOSIS — B952 Enterococcus as the cause of diseases classified elsewhere: Secondary | ICD-10-CM | POA: Diagnosis present

## 2011-05-24 DIAGNOSIS — S72109A Unspecified trochanteric fracture of unspecified femur, initial encounter for closed fracture: Principal | ICD-10-CM | POA: Diagnosis present

## 2011-05-24 DIAGNOSIS — N390 Urinary tract infection, site not specified: Secondary | ICD-10-CM | POA: Diagnosis present

## 2011-05-24 LAB — URINALYSIS, ROUTINE W REFLEX MICROSCOPIC
Bilirubin Urine: NEGATIVE
Glucose, UA: NEGATIVE mg/dL
Ketones, ur: NEGATIVE mg/dL
Protein, ur: NEGATIVE mg/dL
pH: 7.5 (ref 5.0–8.0)

## 2011-05-24 LAB — DIFFERENTIAL
Basophils Absolute: 0 10*3/uL (ref 0.0–0.1)
Basophils Relative: 0 % (ref 0–1)
Eosinophils Absolute: 0 10*3/uL (ref 0.0–0.7)
Monocytes Absolute: 0.9 10*3/uL (ref 0.1–1.0)
Neutro Abs: 14.9 10*3/uL — ABNORMAL HIGH (ref 1.7–7.7)
Neutrophils Relative %: 88 % — ABNORMAL HIGH (ref 43–77)

## 2011-05-24 LAB — URINE MICROSCOPIC-ADD ON

## 2011-05-24 LAB — CBC
Hemoglobin: 14.5 g/dL (ref 12.0–15.0)
MCHC: 34.3 g/dL (ref 30.0–36.0)
Platelets: 269 10*3/uL (ref 150–400)
RDW: 12.2 % (ref 11.5–15.5)

## 2011-05-24 LAB — BASIC METABOLIC PANEL
BUN: 19 mg/dL (ref 6–23)
CO2: 27 mEq/L (ref 19–32)
Calcium: 10.1 mg/dL (ref 8.4–10.5)
Creatinine, Ser: 0.69 mg/dL (ref 0.50–1.10)
Glucose, Bld: 147 mg/dL — ABNORMAL HIGH (ref 70–99)

## 2011-05-24 LAB — POCT I-STAT TROPONIN I: Troponin i, poc: 0.02 ng/mL (ref 0.00–0.08)

## 2011-05-24 LAB — APTT: aPTT: 29 seconds (ref 24–37)

## 2011-05-24 LAB — CARDIAC PANEL(CRET KIN+CKTOT+MB+TROPI)
Relative Index: 3 — ABNORMAL HIGH (ref 0.0–2.5)
Troponin I: <0.3 ng/mL (ref <0.30)

## 2011-05-24 LAB — GLUCOSE, CAPILLARY: Glucose-Capillary: 156 mg/dL — ABNORMAL HIGH (ref 70–99)

## 2011-05-25 ENCOUNTER — Inpatient Hospital Stay (HOSPITAL_COMMUNITY): Payer: Medicare Other

## 2011-05-25 DIAGNOSIS — I369 Nonrheumatic tricuspid valve disorder, unspecified: Secondary | ICD-10-CM

## 2011-05-25 LAB — HEMOGLOBIN A1C
Hgb A1c MFr Bld: 6.4 % — ABNORMAL HIGH (ref <5.7)
Mean Plasma Glucose: 137 mg/dL — ABNORMAL HIGH (ref <117)

## 2011-05-25 LAB — CARDIAC PANEL(CRET KIN+CKTOT+MB+TROPI)
CK, MB: 3.6 ng/mL (ref 0.3–4.0)
Total CK: 116 U/L (ref 7–177)
Troponin I: <0.3 ng/mL (ref <0.30)
Troponin I: <0.3 ng/mL (ref <0.30)

## 2011-05-25 LAB — CBC
MCV: 87.1 fL (ref 78.0–100.0)
Platelets: 212 10*3/uL (ref 150–400)
RDW: 12.3 % (ref 11.5–15.5)
WBC: 10.4 10*3/uL (ref 4.0–10.5)

## 2011-05-25 LAB — BASIC METABOLIC PANEL
BUN: 14 mg/dL (ref 6–23)
Calcium: 9.7 mg/dL (ref 8.4–10.5)
Chloride: 104 mEq/L (ref 96–112)
Creatinine, Ser: 0.71 mg/dL (ref 0.50–1.10)
GFR calc Af Amer: 84 mL/min — ABNORMAL LOW (ref >90)

## 2011-05-25 LAB — MRSA PCR SCREENING: MRSA by PCR: NEGATIVE

## 2011-05-25 NOTE — H&P (Signed)
[name], [name] NO.:  [id number]  MEDICAL RECORD NO.:  [id number]  LOCATION:  WLED                         FACILITY:  [facility]  PHYSICIAN:  Crystal Blower, MD       DATE OF BIRTH:  1915-07-20  DATE OF ADMISSION:  05/24/2011 DATE OF DISCHARGE:                             HISTORY & PHYSICAL   PRIMARY CARE PHYSICIAN:  [name], M.D.  CHIEF COMPLAINT:  Fall at the assisted living facility.  HISTORY OF PRESENT ILLNESS:  [name] is a 77 year old Caucasian female with history of prediabetes/diabetes; macular degeneration leading to the patient being legally blind; aortic stenosis; mitral valve prolapse; hypertension; and history of bradycardia, status post pacemaker placement, who presents with the above complaints.  The patient reports that she had been in usual state of health; however, she has had increased number of falls recently.  Today at 10:00 am, she was getting ready to go to the bathroom and she reported that she fell.  She is not uncertain how long she fell.  She is uncertain if she tripped on anything.  She is not sure of the circumstances of the fall. She did report that she had landed on her right side and hit her head. She was brought to the emergency department with right forehead and scalp hematoma and right hip pain.  She had a CT of the head, which showed hematoma, no intracranial hemorrhage.  The x-ray of the hip did not show any acute osseous abnormality.  Given the patient has a significant pain on her head and her right hip, the hospitalist service was asked to admit the patient for further care and management as she is weak and can no longer take care of herself.  The patient denies any recent fevers or chills.  Denies any chest pain or shortness of breath. Denies any abdominal pain.  Did note diarrhea a few days ago.  Did have nausea earlier today, which resolved.  Currently does complain of a right-sided headache from the trauma  and denies any new vision changes.  REVIEW OF SYSTEMS:  All systems were reviewed with the patient, was positive as per HPI, otherwise all other systems are negative.  PAST MEDICAL HISTORY: 1. Diabetes/prediabetes, currently not on any antidiabetic     medications, may be diet controlled. 2. History of aortic stenosis. 3. Legally blind from macular degeneration. 4. Uncontrolled hypertension. 5. History of bradycardia, status post pacemaker placement in January,     2006. 6. History of mitral valve prolapse. 7. Hypothyroidism.  SOCIAL HISTORY:  The patient has never smoked.  Does not drink any alcohol.  Currently lives at [facility].  FAMILY HISTORY:  Significant for mother having colon cancer and father had a heart attack.  HOME MEDICATIONS: 1. Milk of magnesia 30 mL every other day as needed for constipation. 2. Black cohosh over-the-counter 1 tablet p.o. daily. 3. Total protein over-the-counter 1 tablet p.o. daily. 4. Os-Cal 2 tablets daily at bedtime. 5. Simvastatin 10 mg p.o. q.p.m. 6. Lecithin 1200 mg p.o. daily at bedtime. 7. AREDS Softgel p.o. 1 capsule p.o. twice daily. 8. Sertraline 25 mg p.o. q.a.m.  9. Polyethylene glycol 3350 17 g p.o. q.a.m. 10.Oxybutynin XL 5 mg p.o. q.a.m. 11.Metoprolol-XL 50 mg p.o. q.a.m. 12.Levothyroxine 12.5 mcg p.o. daily.  PHYSICAL EXAMINATION:  VITAL SIGNS:  Temperature 98.2, blood pressure 186/79, heart rate 61, respiration 14, and satting at 96% on room air. GENERAL:  The patient was alert and oriented.  Did not appear to be in acute distress, was lying in bed comfortably. HEENT:  Extraocular motions are intact.  Pupils equal and round.  Had moist mucous membranes.  The patient had right scalp and forehead hematoma with some induration and was painful to touch. NECK:  Supple. LUNGS:  Clear to auscultation bilaterally. HEART:  Regular with S1 and S2. ABDOMEN:  Soft, nontender, and nondistended.  Positive  bowel sounds. EXTREMITIES: The patient with good peripheral pulses with trace edema. Had pain with hip flexion.  No overt ecchymoses or bruising noted on right hip. NEUROLOGIC:  Cranial nerves II through XII grossly intact.  Had 5/5 motor strength in upper as well as lower extremities.  RADIOLOGIST/IMAGING:  The patient had a CT of the head without contrast, which showed a large right frontal subcutaneous hematoma without underlying fracture or intracranial hemorrhage.  Moderate small-vessel disease type changes.  Atrophy noted.  The patient had x-ray of the right hip, which shows no definite acute osseous abnormality.  Cortical thinning and irregularity in the right obturator ring most likely represents healed obturator ring fracture. No acute displaced fracture is identified.  Uncomplicated right bipolar hip hemiarthroplasty and left dynamic hip screw.  LABORATORY DATA:  CBC shows a white count of 17.0, hemoglobin 14.5, hematocrit 42.3, and platelet count 269.  Electrolytes normal except potassium is 3.4 with a creatinine of 0.69.  UA is negative for nitrites, had trace leukocytes and many bacteria noted.  ASSESSMENT AND PLAN:1. Fall with right scalp hematoma and right hip pain.  Uncertain if     the patient had a near syncope or it was a mechanical fall.  We     will admit the patient to tele and trend her troponins.  Not     complaining of any chest pain.  The patient does report that she     did not lose any consciousness.  We will have Physical Therapy and     Occupational Therapy evaluated the patient and given the fall to     see if the patient has any underlying weakness that contributed to     her fall. 2. Leukocytosis, likely reactive.  Urinalysis is negative for     nitrites, but has many bacteria.  Will start antibiotics for     possible urinary tract infection. 3. Hypokalemia replaced as needed. 4. Near syncope as indicated above.  We will have the patient on      telemetry and trend her troponins.  Electrocardiogram  shows right     bundle branch block.  No prior electrocardiograms for comparison.     The patient not complaining of any chest pain. 5. Possible urinary tract infection.  Urinalysis is negative for     nitrites.  There are trace leukocytes and many bacteria are noted     with rare squamous epithelial cells.  We will treat her for a short     course of Cipro.  We will send for a urine culture and if it grows     positive, may need a longer course of antibiotics. 6. Prediabetes versus possible diabetes, currently not on any diabetic     medications.  The patient may be diet controlled.  We will send for     hemoglobin A1c. 7. Malignant hypertension, not well controlled.  May be due to pain     from the fall.  We will add amlodipine to her regimen.  We will     have p.r.n. hydralazine for systolic greater than 160 as needed     q 6 hours. 8. Prophylaxis.  We will have the patient on sequential compression     devices for deep venous thrombosis prophylaxis. 9. Hypothyroidism, continue levothyroxine.  CODE STATUS:  The patient is DNR/DNI, this was discussed with the patient's daughter at the time of admission.  DISPOSITION:  Pending PT/OT evaluation and clinical course  Time spent on admission talking to the patient, family, and coordinating care was 50 minutes.     Crystal Blower, MD     SR/MEDQ  D:  05/24/2011  T:  05/24/2011  Job:  [id number]  Electronically Signed by Wardell Heath Benjermin Korber  on 05/25/2011 12:10:08 AM

## 2011-05-26 ENCOUNTER — Inpatient Hospital Stay (HOSPITAL_COMMUNITY): Payer: Medicare Other

## 2011-05-26 LAB — GLUCOSE, CAPILLARY: Glucose-Capillary: 118 mg/dL — ABNORMAL HIGH (ref 70–99)

## 2011-05-26 LAB — CBC
MCHC: 32.8 g/dL (ref 30.0–36.0)
Platelets: 202 10*3/uL (ref 150–400)
RDW: 12.2 % (ref 11.5–15.5)

## 2011-05-27 LAB — URINE CULTURE
Colony Count: 40000
Culture  Setup Time: 201210240148

## 2011-05-27 LAB — CBC
Hemoglobin: 12.1 g/dL (ref 12.0–15.0)
MCHC: 33.9 g/dL (ref 30.0–36.0)
RBC: 4.06 MIL/uL (ref 3.87–5.11)

## 2011-05-27 LAB — GLUCOSE, CAPILLARY: Glucose-Capillary: 121 mg/dL — ABNORMAL HIGH (ref 70–99)

## 2011-05-28 LAB — COMPREHENSIVE METABOLIC PANEL
AST: 19 U/L (ref 0–37)
Alkaline Phosphatase: 70 U/L (ref 39–117)
BUN: 17 mg/dL (ref 6–23)
CO2: 28 mEq/L (ref 19–32)
Chloride: 103 mEq/L (ref 96–112)
Creatinine, Ser: 0.67 mg/dL (ref 0.50–1.10)
GFR calc non Af Amer: 73 mL/min — ABNORMAL LOW (ref >90)
Total Bilirubin: 0.8 mg/dL (ref 0.3–1.2)

## 2011-05-28 LAB — GLUCOSE, CAPILLARY
Glucose-Capillary: 145 mg/dL — ABNORMAL HIGH (ref 70–99)
Glucose-Capillary: 141 mg/dL — ABNORMAL HIGH (ref 70–99)

## 2011-05-29 DIAGNOSIS — S72109A Unspecified trochanteric fracture of unspecified femur, initial encounter for closed fracture: Secondary | ICD-10-CM

## 2011-05-29 HISTORY — DX: Unspecified trochanteric fracture of unspecified femur, initial encounter for closed fracture: S72.109A

## 2011-05-29 LAB — GLUCOSE, CAPILLARY: Glucose-Capillary: 123 mg/dL — ABNORMAL HIGH (ref 70–99)

## 2011-05-30 LAB — GLUCOSE, CAPILLARY: Glucose-Capillary: 133 mg/dL — ABNORMAL HIGH (ref 70–99)

## 2011-05-30 LAB — BASIC METABOLIC PANEL
BUN: 21 mg/dL (ref 6–23)
CO2: 26 mEq/L (ref 19–32)
Calcium: 10.3 mg/dL (ref 8.4–10.5)
Creatinine, Ser: 0.69 mg/dL (ref 0.50–1.10)
Glucose, Bld: 130 mg/dL — ABNORMAL HIGH (ref 70–99)

## 2011-06-03 NOTE — Discharge Summary (Signed)
  NAMECARRIE, USERY                  ACCOUNT NO.:  [id number]  MEDICAL RECORD NO.:  [id number]  LOCATION:  1445                         FACILITY:  [facility]  PHYSICIAN:  Richarda Overlie, MD       DATE OF BIRTH:  03/11/1915  DATE OF ADMISSION:  05/24/2011 DATE OF DISCHARGE:  05/30/2011                              DISCHARGE SUMMARY   Kindly see the discharge summary from May 26, 2011, for further details of the patient's current discharge diagnoses, discharge medications, and hospital course.  ADDITIONAL DISCHARGE MEDICATION:  Lovenox 40 mg subcutaneously daily. This can be discontinued once the patient is fully ambulating.  INTERIM HOSPITAL COURSE:  The patient unfortunately did not have transportation on Friday and therefore could not be discharged.  She has been hemodynamically stable throughout the weekend.  She has been somewhat hypokalemic and her potassium was repleted yesterday.  Today, the patient's potassium is within the normal range of 4.3.  She would need close outpatient monitoring of her electrolytes and a CBC.  The patient is otherwise hemodynamically stable to be discharged.  PHYSICAL EXAMINATION:  VITAL SIGNS:  Prior to discharge blood pressure 153/71, pulse 65, respirations 18, temperature 98, and 96% on room air. GENERAL:  Currently comfortable, in no acute cardiopulmonary distress. HEENT:  Pupils are equal and reactive.  Extraocular movements intact. NECK:  Supple.  No JVD. LUNGS:  Clear to auscultation bilaterally.  No wheezes.  No crackles. No rhonchi. CARDIOVASCULAR:  Regular rate and rhythm.  No murmurs, rubs, or gallops. ABDOMEN:  Soft, nontender, and nondistended. NEUROLOGIC:  Cranial nerves II through XII grossly intact.     Richarda Overlie, MD     NA/MEDQ  D:  05/30/2011  T:  05/30/2011  Job:  [id number]  Electronically Signed by Richarda Overlie MD on 06/03/2011 10:42:14 AM

## 2011-06-03 NOTE — Discharge Summary (Signed)
NAMELACHELL, [name]                  ACCOUNT NO.:  [id number]  MEDICAL RECORD NO.:  [id number]  LOCATION:  1445                         FACILITY:  [facility]  PHYSICIAN:  Richarda Overlie, MD       DATE OF BIRTH:  1914-09-20  DATE OF ADMISSION:  05/24/2011 DATE OF DISCHARGE:  05/27/2011                        DISCHARGE SUMMARY - REFERRING   PRIMARY CARE PHYSICIAN:  [name], M.D.  DISCHARGE DIAGNOSES: 1. Fall resulting in a nondisplaced right greater trochanteric     fracture with good range of motion per Orthopedics. 2. Borderline diabetes. 3. Legally blind secondary to macular degeneration. 4. Uncontrolled hypertension. 5. History of bradycardia status post pacemaker placement in January     2006, with interrogation. 6. History of mitral valve prolapse. 7. Hypothyroidism. 8. Enterococcal urinary tract infection.  DISCHARGE MEDICATIONS: 1. Tylenol 650 p.o. q.4 p.r.n. 2. Norvasc 10 mg p.o. daily. 3. Doxycycline 100 mg p.o. twice daily for 7 days. 4. Zofran 4 mg p.o. q.6 p.r.n. 5. OxyIR 5 mg p.o. q.4 p.r.n. 6. Ultram 50 mg p.o. q.6 p.r.n. 7. Black cohosh 1 capsule p.o. daily. 8. Lecithin 1200 mg p.o. daily. 9. Levothyroxine 12.5 p.o. daily. 10.Metoprolol-XL 50 mg p.o. every morning. 11.Milk of magnesia 30 mL p.o. every other day. 12.Os-Cal 500 two tablets p.o. at bedtime. 13.Oxybutynin XL 5 mg 1 tablet p.o. in the morning. 14.Softgel 1 capsule p.o. twice daily. 15.MiraLAX 17 g p.o. in the morning. 16.Zoloft 25 mg p.o. daily. 17.Simvastatin 10 mg p.o. in the morning. 18.Soy Protein 1 tablet p.o. daily at breakfast.  CONSULTATIONS:  Dyke Brackett, M.D. because of hairline fracture.  PROCEDURES:  CT of the head without contrast shows a large right frontal subcutaneous hematoma without any underlying fracture or intracranial hemorrhage.  Chest x-ray shows no acute cardiopulmonary disease.  CT scan of the abdomen and pelvis shows proximal right femur fracture, but repeat  x-rays of the femur shows right hip hemiarthroplasty anatomically aligned without any evidence of breakage or loosening of hardware.  SUBJECTIVE:  A 77 year old female, who presented after a fall at around 10 a.m. when the patient was getting up to go to the bathroom.  The patient does not recall the circumstances of the fall.  She has fallen before.  The patient initially had a CT of the head without contrast that showed a subcutaneous hematoma, but no intracranial bleed or CVA. The patient had difficulty weightbearing that prompted further testing, including a CT scan of the abdomen and pelvis that was suggestive of a right proximal femur fracture.  The patient has already had a right hip hemiarthroplasty by Dr. Deniece Portela in 2005.  Dr. [name] was consulted.  Repeat films did not show any evidence of fracture.  Per Dr. [name], the patient does have a hairline fracture and is okay to bear weight as tolerated, and needs to work with physical and occupational therapy to heal from this.  No surgical intervention is required.  Fall could be secondary to enterococcal UTI.  Based on the culture and sensitivity, the patient will be started on doxycycline, which she will continue for 7 days.  Other causes of the fall have been evaluated.  The  pacemaker will be interrogated on the Telemetry.  She has a pacemaker and seems to be functioning without any pauses or bradycardia.  There is no obvious documented arrhythmias on telemetry.  We have cycled the cardiac enzymes.  We have also done a 2-D echo to evaluate her for her aortic stenosis/mitral valve prolapse.  The patient has mild LVH with an EF of 60%.  There is no significant sclerosis or regurgitation.  PLAN:  The patient will be discharged to a skilled nursing facility when bed is available.  PHYSICAL EXAMINATION:  VITAL SIGNS:  Blood pressure 176/69, respirations 24, pulse 64, temperature 98.0, 97% on room air. GENERAL:  Currently  comfortable in no acute cardiopulmonary distress. HEENT:  Pupils equal and reactive.  Extraocular movements intact. NECK:  Supple.  No JVD. LUNGS:  Clear to auscultation bilaterally.  No wheezes, no crackles, no rhonchi. CARDIOVASCULAR:  Regular rate and rhythm.  No murmurs, rubs, or gallops. ABDOMEN:  Soft, nontender, nondistended. EXTREMITIES:  Without cyanosis, clubbing, or edema. NEUROLOGIC:  Cranial nerves II through XII grossly intact. PSYCHIATRIC:  Appropriate mood and affect.  FOLLOW UP APPOINTMENT:  With Dr. Tamala Bari in 10-14 days, with PCP in 5-7 days.     Richarda Overlie, MD     NA/MEDQ  D:  05/27/2011  T:  05/27/2011  Job:  [id number]  Electronically Signed by Richarda Overlie MD on 06/03/2011 10:42:10 AM

## 2011-06-30 ENCOUNTER — Encounter: Payer: Medicare Other | Admitting: *Deleted

## 2011-07-05 ENCOUNTER — Encounter: Payer: Self-pay | Admitting: *Deleted

## 2011-07-25 ENCOUNTER — Encounter: Payer: Self-pay | Admitting: Internal Medicine

## 2011-08-02 DIAGNOSIS — F0390 Unspecified dementia without behavioral disturbance: Secondary | ICD-10-CM

## 2011-08-02 HISTORY — DX: Unspecified dementia, unspecified severity, without behavioral disturbance, psychotic disturbance, mood disturbance, and anxiety: F03.90

## 2011-09-06 ENCOUNTER — Encounter: Payer: Self-pay | Admitting: Internal Medicine

## 2011-09-07 ENCOUNTER — Encounter: Payer: Self-pay | Admitting: Internal Medicine

## 2011-09-07 ENCOUNTER — Ambulatory Visit (INDEPENDENT_AMBULATORY_CARE_PROVIDER_SITE_OTHER): Payer: Medicare Other | Admitting: Internal Medicine

## 2011-09-07 VITALS — BP 118/52 | HR 60 | Ht 62.0 in | Wt 119.0 lb

## 2011-09-07 DIAGNOSIS — I4891 Unspecified atrial fibrillation: Secondary | ICD-10-CM

## 2011-09-07 DIAGNOSIS — Z950 Presence of cardiac pacemaker: Secondary | ICD-10-CM

## 2011-09-07 DIAGNOSIS — I495 Sick sinus syndrome: Secondary | ICD-10-CM

## 2011-09-07 HISTORY — DX: Presence of cardiac pacemaker: Z95.0

## 2011-09-07 LAB — PACEMAKER DEVICE OBSERVATION
ATRIAL PACING PM: 97.2
BAMS-0001: 175 {beats}/min
BAMS-0002: 20 ms
BATTERY VOLTAGE: 2.77 V
RV LEAD THRESHOLD: 0.75 V
VENTRICULAR PACING PM: 0.3

## 2011-09-07 NOTE — Progress Notes (Signed)
  HPI  [name]. [name] is a 78 y.o. female seen in followup for sinus node dysfunction chronotropic incompetence which has a pacemaker implanted.    She is doing quite well  She is in a wheel chair She hurt her back.  She also has macular degeneration and she is concerned about the potential of anticoagulation to affect her vision  The patient denies chest pain, shortness of breath, nocturnal dyspnea, orthopnea or peripheral edema.  There have been no palpitations, lightheadedness or syncope.    Past Medical History  Diagnosis Date  . HTN (hypertension)   . Other and unspecified hyperlipidemia   . DM (diabetes mellitus)   . Hyperthyroidism   . Hemorrhoids   . Legally blind     secondary to  macular degeneration  . AF (atrial fibrillation)   . Rectal bleeding   . Sinus node dysfunction   . Hip fracture   . Bradycardia   . Aortic stenosis   . Mitral valve prolapse   . Hearing loss     Past Surgical History  Procedure Date  . Pacemaker insertion 2006    Medtronic In-Pulse  . Left hip surgery 09/2005  . Right hip surgery 08/2003  . Total abdominal hysterectomy w/ bilateral salpingoophorectomy     age 12    Current Outpatient Prescriptions  Medication Sig Dispense Refill  . acetaminophen (TYLENOL) 325 MG tablet Take 650 mg by mouth every 6 (six) hours as needed.      Marland Kitchen amLODipine (NORVASC) 10 MG tablet Take 10 mg by mouth Daily.      . calcium-vitamin D (OSCAL WITH D) 500-200 MG-UNIT per tablet Take 1 tablet by mouth 2 (two) times daily.      . Lecithin 1200 MG CAPS Take by mouth daily.      Marland Kitchen levothyroxine (SYNTHROID, LEVOTHROID) 25 MCG tablet Take 12.5 mg by mouth Daily.      . magnesium hydroxide (MILK OF MAGNESIA) 400 MG/5ML suspension Take by mouth daily as needed.      . metoprolol succinate (TOPROL-XL) 50 MG 24 hr tablet Take 50 mg by mouth Daily.      . Multiple Vitamins-Minerals (ICAPS AREDS FORMULA PO) Take by mouth 2 (two) times daily.      Marland Kitchen oxybutynin  (DITROPAN-XL) 5 MG 24 hr tablet Take 5 mg by mouth Daily.      Marland Kitchen oxycodone (OXY-IR) 5 MG capsule Take 5 mg by mouth every 4 (four) hours as needed.      . polyethylene glycol powder (GLYCOLAX/MIRALAX) powder Take 17 g by mouth Daily.      . sertraline (ZOLOFT) 25 MG tablet Take 25 mg by mouth Daily.      . simvastatin (ZOCOR) 10 MG tablet Take 10 mg by mouth Daily.        Allergies  Allergen Reactions  . Ibuprofen   . Lisinopril   . Penicillins   . [place] (Trospium Chloride)     Review of Systems negative except from HPI and PMH  Physical Exam BP 118/52  Pulse 60  Ht 5\' 2"  (1.575 m)  Wt 119 lb (53.978 kg)  BMI 21.77 kg/m2 Well developed and well nourished sitting in wheel chair no acute distress HENT normal Neck Supple Clear to ausculation Regular rate and rhythm, no murmurs gallops or rub Soft with active bowel sounds No clubbing cyanosis none Edema Alert and oriented, grossly normal motor and sensory function Skin Warm and Dry   Assessment and  Plan

## 2011-09-07 NOTE — Patient Instructions (Addendum)
Your physician wants you to follow-up in: 6 months with device clinic & 1 year with Dr. Klein. You will receive a reminder letter in the mail two months in advance. If you don't receive a letter, please call our office to schedule the follow-up appointment.  Your physician recommends that you continue on your current medications as directed. Please refer to the Current Medication list given to you today.   

## 2011-09-07 NOTE — Assessment & Plan Note (Signed)
Stable post pacing 

## 2011-09-07 NOTE — Assessment & Plan Note (Signed)
The patient's device was interrogated.  The information was reviewed. No changes were made in the programming.    

## 2012-02-11 LAB — CBC AND DIFFERENTIAL
HCT: 43 % (ref 36–46)
Hemoglobin: 14.5 g/dL (ref 12.0–16.0)
Platelets: 279 10*3/uL (ref 150–399)
WBC: 7.8 10^3/mL

## 2012-02-11 LAB — TSH: TSH: 2.93 u[IU]/mL (ref 0.41–5.90)

## 2012-02-11 LAB — LIPID PANEL
Cholesterol: 157 mg/dL (ref 0–200)
HDL: 52 mg/dL (ref 35–70)
LDL Cholesterol: 77 mg/dL
Triglycerides: 141 mg/dL (ref 40–160)

## 2012-06-07 ENCOUNTER — Telehealth: Payer: Self-pay | Admitting: Internal Medicine

## 2012-06-07 NOTE — Telephone Encounter (Signed)
06-07-12 lmm at 433pm for pt to set up past due pacer ck with device, was due in august/mt

## 2012-06-20 ENCOUNTER — Encounter: Payer: Self-pay | Admitting: Internal Medicine

## 2012-06-20 ENCOUNTER — Ambulatory Visit (INDEPENDENT_AMBULATORY_CARE_PROVIDER_SITE_OTHER): Payer: Medicare Other | Admitting: *Deleted

## 2012-06-20 DIAGNOSIS — I495 Sick sinus syndrome: Secondary | ICD-10-CM

## 2012-06-20 LAB — PACEMAKER DEVICE OBSERVATION
AL AMPLITUDE: 1.4 mv
BAMS-0001: 175 {beats}/min
RV LEAD AMPLITUDE: 11.2 mv
RV LEAD IMPEDENCE PM: 803 Ohm
RV LEAD THRESHOLD: 0.75 V

## 2012-06-20 NOTE — Progress Notes (Signed)
PPM check 

## 2012-12-19 ENCOUNTER — Non-Acute Institutional Stay: Payer: 59 | Admitting: Nurse Practitioner

## 2012-12-19 DIAGNOSIS — W19XXXA Unspecified fall, initial encounter: Secondary | ICD-10-CM

## 2012-12-19 DIAGNOSIS — S0093XA Contusion of unspecified part of head, initial encounter: Secondary | ICD-10-CM

## 2012-12-19 DIAGNOSIS — I4891 Unspecified atrial fibrillation: Secondary | ICD-10-CM

## 2012-12-19 DIAGNOSIS — S1093XA Contusion of unspecified part of neck, initial encounter: Secondary | ICD-10-CM

## 2012-12-19 DIAGNOSIS — E785 Hyperlipidemia, unspecified: Secondary | ICD-10-CM

## 2012-12-19 DIAGNOSIS — E039 Hypothyroidism, unspecified: Secondary | ICD-10-CM

## 2012-12-19 DIAGNOSIS — K5900 Constipation, unspecified: Secondary | ICD-10-CM | POA: Insufficient documentation

## 2012-12-19 DIAGNOSIS — N318 Other neuromuscular dysfunction of bladder: Secondary | ICD-10-CM

## 2012-12-19 DIAGNOSIS — I10 Essential (primary) hypertension: Secondary | ICD-10-CM

## 2012-12-19 DIAGNOSIS — Z66 Do not resuscitate: Secondary | ICD-10-CM

## 2012-12-19 HISTORY — DX: Unspecified fall, initial encounter: W19.XXXA

## 2012-12-19 NOTE — Assessment & Plan Note (Signed)
Right parietal hematoma presents. No focal neurological deficit noted. May CT head if AMS or focal neurological symptoms develop. Continue Neuro check.

## 2012-12-19 NOTE — Assessment & Plan Note (Signed)
Rate controlled, has Visual merchandiser.

## 2012-12-19 NOTE — Progress Notes (Signed)
Patient ID: [name]. Leugers, female   DOB: 1914/12/26, 79 y.o.   MRN: [id number]  Chief Complaint:  Chief Complaint  Patient presents with  . Medical Managment of Chronic Issues    s/p fall, right parietal hematoma.      HPI:   Problem List Items Addressed This Visit   UNSPECIFIED HYPOTHYROIDISM     Takes Levothyroxine 12.64mcg, last TSH 2.926 02/20/13, update TSH     OTHER AND UNSPECIFIED HYPERLIPIDEMIA     Takes Simvastatin, last LDL 77 02/20/13, update lipid panel.     HYPERTENSION     Controlled on Metoprolol 50mg  bid and Amlodipine 10mg  daily, check CMP    ATRIAL FIBRILLATION     Rate controlled, has Visual merchandiser.     Head contusion - Primary     Right parietal hematoma presents. No focal neurological deficit noted. May CT head if AMS or focal neurological symptoms develop. Continue Neuro check.     Fall     Stated she felt weak then fell in her room, right parietal hematoma, no focal neurological deficit presently, under neuro check in AL setting, UA and CBC pending. May CT head if AMS develops.     DNR (do not resuscitate)   Hypertonicity of bladder     Better with Oxybutynin 5mg  daily.     Unspecified constipation     Stable on MiraLax daily. Prn MOM       Review of Systems:  Review of Systems  Constitutional: Positive for malaise/fatigue. Negative for fever, chills, weight loss and diaphoresis.  HENT: Positive for hearing loss (severe with hearing aids. ). Negative for ear pain, nosebleeds, congestion, sore throat, neck pain, tinnitus and ear discharge.   Eyes: Positive for blurred vision (macular degeneration ). Negative for pain, discharge and redness.  Respiratory: Negative for cough, hemoptysis, sputum production, shortness of breath, wheezing and stridor.   Cardiovascular: Negative for chest pain, palpitations, orthopnea, claudication, leg swelling and PND.  Gastrointestinal: Negative for heartburn, nausea, vomiting, abdominal pain, diarrhea, constipation, blood  in stool and melena.  Genitourinary: Positive for frequency (chronic). Negative for dysuria, urgency, hematuria and flank pain.  Musculoskeletal: Positive for falls. Negative for myalgias, back pain and joint pain.  Skin: Negative for itching and rash.       Right parietal hematoma.   Neurological: Negative for dizziness, tingling, tremors, sensory change, speech change, focal weakness, seizures, loss of consciousness, weakness and headaches.  Endo/Heme/Allergies: Negative for environmental allergies and polydipsia. Does not bruise/bleed easily.  Psychiatric/Behavioral: Positive for depression and memory loss. Negative for hallucinations. The patient is not nervous/anxious and does not have insomnia.      Medications: Patient's Medications  New Prescriptions   No medications on file  Previous Medications   ACETAMINOPHEN (TYLENOL) 325 MG TABLET    Take 650 mg by mouth every 6 (six) hours as needed.   AMLODIPINE (NORVASC) 10 MG TABLET    Take 10 mg by mouth Daily.   CALCIUM-VITAMIN D (OSCAL WITH D) 500-200 MG-UNIT PER TABLET    Take 1 tablet by mouth 2 (two) times daily.   LECITHIN 1200 MG CAPS    Take by mouth daily.   LEVOTHYROXINE (SYNTHROID, LEVOTHROID) 25 MCG TABLET    Take 12.5 mg by mouth Daily.   MAGNESIUM HYDROXIDE (MILK OF MAGNESIA) 400 MG/5ML SUSPENSION    Take by mouth daily as needed.   METOPROLOL SUCCINATE (TOPROL-XL) 50 MG 24 HR TABLET    Take 50 mg by mouth Daily.  MULTIPLE VITAMINS-MINERALS (ICAPS AREDS FORMULA PO)    Take by mouth 2 (two) times daily.   OXYBUTYNIN (DITROPAN-XL) 5 MG 24 HR TABLET    Take 5 mg by mouth Daily.   OXYCODONE (OXY-IR) 5 MG CAPSULE    Take 5 mg by mouth every 4 (four) hours as needed.   POLYETHYLENE GLYCOL POWDER (GLYCOLAX/MIRALAX) POWDER    Take 17 g by mouth Daily.   SERTRALINE (ZOLOFT) 25 MG TABLET    Take 25 mg by mouth Daily.   SIMVASTATIN (ZOCOR) 10 MG TABLET    Take 10 mg by mouth Daily.  Modified Medications   No medications on file   Discontinued Medications   No medications on file     Physical Exam: Physical Exam  Constitutional: She is oriented to person, place, and time. She appears well-developed and well-nourished.  HENT:  Head: Normocephalic and atraumatic.  Right Ear: External ear normal.  Left Ear: External ear normal.  Nose: Nose normal.  Mouth/Throat: Oropharynx is clear and moist. No oropharyngeal exudate.  Eyes: Conjunctivae and EOM are normal. Pupils are equal, round, and reactive to light. Right eye exhibits no discharge. Left eye exhibits no discharge. No scleral icterus.  Neck: Normal range of motion. Neck supple. No JVD present. No thyromegaly present.  Cardiovascular: Normal rate.   No murmur heard. Pacemaker.   Pulmonary/Chest: Effort normal and breath sounds normal. No respiratory distress. She has no wheezes. She has no rales. She exhibits no tenderness.  Abdominal: Soft. Bowel sounds are normal. She exhibits no distension. There is no tenderness. There is no rebound.  Musculoskeletal: Normal range of motion. She exhibits no edema and no tenderness.  Lymphadenopathy:    She has no cervical adenopathy.  Neurological: She is alert and oriented to person, place, and time. She has normal reflexes. She displays normal reflexes. No cranial nerve deficit. She exhibits normal muscle tone. Coordination normal.  Skin: Skin is warm and dry. No rash noted. She is not diaphoretic. No erythema. No pallor.  Right parietal hematoma.   Psychiatric: Thought content normal. Her mood appears not anxious. Her affect is not angry, not blunt, not labile and not inappropriate. Her speech is not rapid and/or pressured, not delayed and not slurred. She is slowed. She is not agitated, not aggressive, not hyperactive, not withdrawn, not actively hallucinating and not combative. Thought content is not paranoid and not delusional. Cognition and memory are impaired. She expresses impulsivity and inappropriate judgment. She  exhibits a depressed mood. She exhibits abnormal recent memory.  Flat affect She is attentive.     Filed Vitals:   12/19/12 1535  BP: 120/60  Pulse: 60  Temp: 98.1 F (36.7 C)  TempSrc: Tympanic  Resp: 20      Labs reviewed: Basic Metabolic Panel:  Recent Labs  19/14/78  NA 141  K 3.7  BUN 26*  CREATININE 0.8  TSH 2.93    Liver Function Tests: No results found for this basename: AST, ALT, ALKPHOS, BILITOT, PROT, ALBUMIN,  in the last 8760 hours  CBC:  Recent Labs  02/11/12  WBC 7.8  HGB 14.5  HCT 43  PLT 279    Anemia Panel: No results found for this basename: IRON, FOLATE, VITAMINB12,  in the last 8760 hours  Significant Diagnostic Results:     Assessment/Plan Fall Stated she felt weak then fell in her room, right parietal hematoma, no focal neurological deficit presently, under neuro check in AL setting, UA and CBC pending. May CT head  if AMS develops.   Head contusion Right parietal hematoma presents. No focal neurological deficit noted. May CT head if AMS or focal neurological symptoms develop. Continue Neuro check.   UNSPECIFIED HYPOTHYROIDISM Takes Levothyroxine 12.57mcg, last TSH 2.926 02/20/13, update TSH   HYPERTENSION Controlled on Metoprolol 50mg  bid and Amlodipine 10mg  daily, check CMP  ATRIAL FIBRILLATION Rate controlled, has Visual merchandiser.   Hypertonicity of bladder Better with Oxybutynin 5mg  daily.   Unspecified constipation Stable on MiraLax daily. Prn MOM  OTHER AND UNSPECIFIED HYPERLIPIDEMIA Takes Simvastatin, last LDL 77 02/20/13, update lipid panel.       Family/ staff Communication: safety   Goals of care: AL   Labs/tests ordered CBC, UA, CMP, TSH, lipid panel.

## 2012-12-19 NOTE — Assessment & Plan Note (Signed)
Stable on MiraLax daily. Prn MOM   

## 2012-12-19 NOTE — Assessment & Plan Note (Signed)
Controlled on Metoprolol 50mg  bid and Amlodipine 10mg  daily, check CMP

## 2012-12-19 NOTE — Assessment & Plan Note (Addendum)
Stated she felt weak then fell in her room, right parietal hematoma, no focal neurological deficit presently, under neuro check in AL setting, UA and CBC pending. May CT head if AMS develops.

## 2012-12-19 NOTE — Assessment & Plan Note (Signed)
Takes Simvastatin, last LDL 77 02/20/13, update lipid panel.

## 2012-12-19 NOTE — Assessment & Plan Note (Signed)
Takes Levothyroxine 12.53mcg, last TSH 2.926 02/20/13, update TSH

## 2012-12-19 NOTE — Assessment & Plan Note (Signed)
Better with Oxybutynin 5mg daily.    

## 2012-12-20 LAB — LIPID PANEL
Cholesterol: 140 mg/dL (ref 0–200)
HDL: 38 mg/dL (ref 35–70)
LDL CALC: 57 mg/dL
TRIGLYCERIDES: 227 mg/dL — AB (ref 40–160)

## 2012-12-20 LAB — BASIC METABOLIC PANEL
BUN: 22 mg/dL — AB (ref 4–21)
Creatinine: 0.9 mg/dL (ref 0.5–1.1)
GLUCOSE: 163 mg/dL
Potassium: 4 mmol/L (ref 3.4–5.3)
Sodium: 138 mmol/L (ref 137–147)

## 2012-12-20 LAB — HEPATIC FUNCTION PANEL
ALK PHOS: 88 U/L (ref 25–125)
ALT: 20 U/L (ref 7–35)
AST: 24 U/L (ref 13–35)
BILIRUBIN, TOTAL: 0.8 mg/dL

## 2012-12-20 LAB — CBC AND DIFFERENTIAL
HEMATOCRIT: 41 % (ref 36–46)
Hemoglobin: 14.1 g/dL (ref 12.0–16.0)
PLATELETS: 273 10*3/uL (ref 150–399)
WBC: 9.5 10*3/mL

## 2012-12-20 LAB — TSH: TSH: 2.99 u[IU]/mL (ref 0.41–5.90)

## 2013-01-24 ENCOUNTER — Encounter: Payer: Self-pay | Admitting: Internal Medicine

## 2013-01-24 DIAGNOSIS — H542X22 Low vision right eye category 2, low vision left eye category 2: Secondary | ICD-10-CM | POA: Insufficient documentation

## 2013-01-24 DIAGNOSIS — H3530 Unspecified macular degeneration: Secondary | ICD-10-CM

## 2013-01-24 HISTORY — DX: Unspecified macular degeneration: H35.30

## 2013-02-26 ENCOUNTER — Encounter: Payer: Self-pay | Admitting: *Deleted

## 2013-05-13 ENCOUNTER — Encounter: Payer: Self-pay | Admitting: Internal Medicine

## 2013-05-13 ENCOUNTER — Telehealth: Payer: Self-pay | Admitting: Internal Medicine

## 2013-05-13 NOTE — Telephone Encounter (Signed)
05-13-13 sent past due letter, pt not seen [name] since 09-2011 and device since 06-2012/mt

## 2013-08-02 NOTE — Telephone Encounter (Signed)
No other info °

## 2013-08-22 ENCOUNTER — Encounter: Payer: Self-pay | Admitting: Nurse Practitioner

## 2013-08-22 ENCOUNTER — Non-Acute Institutional Stay: Payer: 59 | Admitting: Nurse Practitioner

## 2013-08-22 DIAGNOSIS — I10 Essential (primary) hypertension: Secondary | ICD-10-CM

## 2013-08-22 DIAGNOSIS — Z950 Presence of cardiac pacemaker: Secondary | ICD-10-CM

## 2013-08-22 DIAGNOSIS — K5900 Constipation, unspecified: Secondary | ICD-10-CM

## 2013-08-22 DIAGNOSIS — E785 Hyperlipidemia, unspecified: Secondary | ICD-10-CM

## 2013-08-22 DIAGNOSIS — R413 Other amnesia: Secondary | ICD-10-CM

## 2013-08-22 DIAGNOSIS — I4891 Unspecified atrial fibrillation: Secondary | ICD-10-CM

## 2013-08-22 DIAGNOSIS — E039 Hypothyroidism, unspecified: Secondary | ICD-10-CM

## 2013-08-22 DIAGNOSIS — N318 Other neuromuscular dysfunction of bladder: Secondary | ICD-10-CM

## 2013-08-22 NOTE — Assessment & Plan Note (Signed)
Stable on MiraLax daily. Prn MOM

## 2013-08-22 NOTE — Assessment & Plan Note (Signed)
Left upper chest-f/u device clinic.

## 2013-08-22 NOTE — Assessment & Plan Note (Signed)
Takes Levothyroxine 12.505mcg, last TSH 2.991 12/20/12, update TSH

## 2013-08-22 NOTE — Assessment & Plan Note (Signed)
Better with Oxybutynin 5mg daily.    

## 2013-08-22 NOTE — Assessment & Plan Note (Signed)
Rate controlled 

## 2013-08-22 NOTE — Assessment & Plan Note (Signed)
Confused time, place, person, will obtain MMSE

## 2013-08-22 NOTE — Progress Notes (Signed)
Patient ID: [name]. [place], female   DOB: [birth year], 80 y.o.   MRN: [id number]     Chief Complaint:  Chief Complaint  Patient presents with  . Medical Managment of Chronic Issues    memory.      HPI:   Problem List Items Addressed This Visit   ATRIAL FIBRILLATION     Rate controlled.     HYPERTENSION     Controlled on Metoprolol 50mg  daily and Amlodipine 10mg  daily, check CMP      Hypertonicity of bladder     Better with Oxybutynin 5mg  daily.       Memory deficits - Primary     Confused time, place, person, will obtain MMSE    OTHER AND UNSPECIFIED HYPERLIPIDEMIA     Takes Simvastatin, last LDL 77 02/20/13       Pacemaker-dual  medtronic     Left upper chest-f/u device clinic.     Unspecified constipation     Stable on MiraLax daily. Prn MOM      UNSPECIFIED HYPOTHYROIDISM     Takes Levothyroxine 12.53mcg, last TSH 2.991 12/20/12, update TSH       Review of Systems:  Review of Systems  Constitutional: Positive for malaise/fatigue. Negative for fever, chills, weight loss and diaphoresis.  HENT: Positive for hearing loss (severe with hearing aids. ). Negative for congestion, ear discharge, ear pain, nosebleeds, sore throat and tinnitus.   Eyes: Positive for blurred vision (macular degeneration ). Negative for pain, discharge and redness.  Respiratory: Negative for cough, hemoptysis, sputum production, shortness of breath, wheezing and stridor.   Cardiovascular: Negative for chest pain, palpitations, orthopnea, claudication, leg swelling and PND.  Gastrointestinal: Negative for heartburn, nausea, vomiting, abdominal pain, diarrhea, constipation, blood in stool and melena.  Genitourinary: Positive for frequency (chronic). Negative for dysuria, urgency, hematuria and flank pain.  Musculoskeletal: Positive for falls. Negative for back pain, joint pain, myalgias and neck pain.  Skin: Negative for itching and rash.  Neurological: Negative for dizziness, tingling,  tremors, sensory change, speech change, focal weakness, seizures, loss of consciousness, weakness and headaches.  Endo/Heme/Allergies: Negative for environmental allergies and polydipsia. Does not bruise/bleed easily.  Psychiatric/Behavioral: Positive for depression and memory loss. Negative for hallucinations. The patient is not nervous/anxious and does not have insomnia.      Medications: Patient's Medications  New Prescriptions   No medications on file  Previous Medications   ACETAMINOPHEN (TYLENOL) 325 MG TABLET    Take 650 mg by mouth every 4 (four) hours as needed.    AMLODIPINE (NORVASC) 10 MG TABLET    Take 10 mg by mouth Daily.   CALCIUM-VITAMIN D (OSCAL WITH D) 500-200 MG-UNIT PER TABLET    Take 1 tablet by mouth 2 (two) times daily.   LECITHIN 1200 MG CAPS    Take by mouth daily.   LEVOTHYROXINE (SYNTHROID, LEVOTHROID) 25 MCG TABLET    Take 12.5 mg by mouth Daily.   MAGNESIUM HYDROXIDE (MILK OF MAGNESIA) 400 MG/5ML SUSPENSION    Take by mouth. Take 30ml every other day as needed for constipation   METOPROLOL SUCCINATE (TOPROL-XL) 50 MG 24 HR TABLET    Take 50 mg by mouth Daily.   MULTIPLE VITAMINS-MINERALS (PRESERVISION AREDS PO)    Take by mouth. One capsule twice daily   OXYBUTYNIN (DITROPAN-XL) 5 MG 24 HR TABLET    Take 5 mg by mouth Daily.   POLYETHYLENE GLYCOL POWDER (GLYCOLAX/MIRALAX) POWDER    Take 17 g by mouth Daily.  SIMVASTATIN (ZOCOR) 10 MG TABLET    Take 10 mg by mouth Daily.  Modified Medications   No medications on file  Discontinued Medications   No medications on file     Physical Exam: Physical Exam  Constitutional: She is oriented to person, place, and time. She appears well-developed and well-nourished.  HENT:  Head: Normocephalic and atraumatic.  Right Ear: External ear normal.  Left Ear: External ear normal.  Nose: Nose normal.  Mouth/Throat: Oropharynx is clear and moist. No oropharyngeal exudate.  Eyes: Conjunctivae and EOM are normal. Pupils  are equal, round, and reactive to light. Right eye exhibits no discharge. Left eye exhibits no discharge. No scleral icterus.  Neck: Normal range of motion. Neck supple. No JVD present. No thyromegaly present.  Cardiovascular: Normal rate.   Murmur heard. Pacemaker. Systolic ejection murmur 3/6  Pulmonary/Chest: Effort normal and breath sounds normal. No respiratory distress. She has no wheezes. She has no rales. She exhibits no tenderness.  Abdominal: Soft. Bowel sounds are normal. She exhibits no distension. There is no tenderness. There is no rebound.  Musculoskeletal: Normal range of motion. She exhibits no edema and no tenderness.  Lymphadenopathy:    She has no cervical adenopathy.  Neurological: She is alert and oriented to person, place, and time. She has normal reflexes. No cranial nerve deficit. She exhibits normal muscle tone. Coordination normal.  Skin: Skin is warm and dry. No rash noted. She is not diaphoretic. No erythema. No pallor.  Psychiatric: Thought content normal. Her mood appears not anxious. Her affect is not angry, not blunt, not labile and not inappropriate. Her speech is not rapid and/or pressured, not delayed and not slurred. She is slowed. She is not agitated, not aggressive, not hyperactive, not withdrawn, not actively hallucinating and not combative. Thought content is not paranoid and not delusional. Cognition and memory are impaired. She expresses impulsivity and inappropriate judgment. She exhibits a depressed mood. She exhibits abnormal recent memory.  Flat affect. Confusion-going to bed before dinner frequently.  She is attentive.     Filed Vitals:   08/22/13 1622  BP: 130/64  Pulse: 64  Temp: 97.8 F (36.6 C)  TempSrc: Tympanic  Resp: 18      Labs reviewed: Basic Metabolic Panel:  Recent Labs  29/56/2105/22/14  NA 138  K 4.0  BUN 22*  CREATININE 0.9  TSH 2.99    Liver Function Tests:  Recent Labs  12/20/12  AST 24  ALT 20  ALKPHOS 88     CBC:  Recent Labs  12/20/12  WBC 9.5  HGB 14.1  HCT 41  PLT 273    Assessment/Plan Memory deficits Confused time, place, person, will obtain MMSE  UNSPECIFIED HYPOTHYROIDISM Takes Levothyroxine 12.765mcg, last TSH 2.991 12/20/12, update TSH  HYPERTENSION Controlled on Metoprolol 50mg  daily and Amlodipine 10mg  daily, check CMP    Unspecified constipation Stable on MiraLax daily. Prn MOM    Pacemaker-dual  medtronic Left upper chest-f/u device clinic.   ATRIAL FIBRILLATION Rate controlled.   Hypertonicity of bladder Better with Oxybutynin 5mg  daily.     OTHER AND UNSPECIFIED HYPERLIPIDEMIA Takes Simvastatin, last LDL 77 02/20/13         Family/ staff Communication: safety   Goals of care: AL   Labs/tests ordered CBC, CMP, TSH

## 2013-08-22 NOTE — Assessment & Plan Note (Signed)
Takes Simvastatin, last LDL 77 02/20/13

## 2013-08-22 NOTE — Assessment & Plan Note (Signed)
Controlled on Metoprolol 50mg  daily and Amlodipine 10mg  daily, check CMP

## 2013-08-26 ENCOUNTER — Telehealth: Payer: Self-pay | Admitting: Internal Medicine

## 2013-08-26 NOTE — Telephone Encounter (Signed)
New message     Pt has not had pacemaker checked in a while.  Is is still on our schedule to have it checked?

## 2013-08-26 NOTE — Telephone Encounter (Signed)
Pt scheduled for 09-19-13 @ 1015 with SK. Pt overdue to see SK. [facility]. Last check Nov 2013. Per Roseanne wanted appt x 2-3 weeks/kwm

## 2013-08-27 LAB — BASIC METABOLIC PANEL
BUN: 18 mg/dL (ref 4–21)
CREATININE: 0.9 mg/dL (ref 0.5–1.1)
GLUCOSE: 171 mg/dL
Potassium: 3.9 mmol/L (ref 3.4–5.3)
Sodium: 141 mmol/L (ref 137–147)

## 2013-08-27 LAB — HEPATIC FUNCTION PANEL
ALT: 23 U/L (ref 7–35)
AST: 37 U/L — AB (ref 13–35)
Alkaline Phosphatase: 82 U/L (ref 25–125)
Bilirubin, Total: 0.7 mg/dL

## 2013-08-27 LAB — CBC AND DIFFERENTIAL
HCT: 44 % (ref 36–46)
Hemoglobin: 15.2 g/dL (ref 12.0–16.0)
PLATELETS: 296 10*3/uL (ref 150–399)
WBC: 8.3 10^3/mL

## 2013-08-27 LAB — TSH: TSH: 2.5 u[IU]/mL (ref 0.41–5.90)

## 2013-08-27 LAB — HEMOGLOBIN A1C: Hgb A1c MFr Bld: 8 % — AB (ref 4.0–6.0)

## 2013-09-19 ENCOUNTER — Encounter: Payer: Medicare Other | Admitting: Internal Medicine

## 2013-09-25 ENCOUNTER — Ambulatory Visit (INDEPENDENT_AMBULATORY_CARE_PROVIDER_SITE_OTHER): Payer: Medicare PPO | Admitting: Internal Medicine

## 2013-09-25 ENCOUNTER — Encounter: Payer: Self-pay | Admitting: Internal Medicine

## 2013-09-25 VITALS — BP 137/72 | HR 64 | Ht 63.0 in

## 2013-09-25 DIAGNOSIS — I495 Sick sinus syndrome: Secondary | ICD-10-CM

## 2013-09-25 DIAGNOSIS — Z950 Presence of cardiac pacemaker: Secondary | ICD-10-CM

## 2013-09-25 LAB — MDC_IDC_ENUM_SESS_TYPE_INCLINIC
Brady Statistic AP VS Percent: 94 %
Brady Statistic AS VP Percent: 0 %
Brady Statistic AS VS Percent: 6 %
Date Time Interrogation Session: 20150225154458
Lead Channel Impedance Value: 778 Ohm
Lead Channel Pacing Threshold Amplitude: 0.5 V
Lead Channel Pacing Threshold Amplitude: 0.5 V
Lead Channel Pacing Threshold Pulse Width: 0.4 ms
Lead Channel Pacing Threshold Pulse Width: 0.4 ms
Lead Channel Sensing Intrinsic Amplitude: 11.2 mV
Lead Channel Setting Pacing Amplitude: 1.5 V
Lead Channel Setting Pacing Amplitude: 2 V
Lead Channel Setting Sensing Sensitivity: 4 mV
MDC IDC MSMT BATTERY IMPEDANCE: 1997 Ohm
MDC IDC MSMT BATTERY REMAINING LONGEVITY: 27 mo
MDC IDC MSMT BATTERY VOLTAGE: 2.73 V
MDC IDC MSMT LEADCHNL RA IMPEDANCE VALUE: 560 Ohm
MDC IDC MSMT LEADCHNL RA SENSING INTR AMPL: 2 mV
MDC IDC SET LEADCHNL RV PACING PULSEWIDTH: 0.4 ms
MDC IDC STAT BRADY AP VP PERCENT: 0 %

## 2013-09-25 NOTE — Progress Notes (Signed)
Patient Care Team: Kimber RelicArthur G Green, MD as PCP - General (Internal Medicine) Man Johnney OuX Mast, NP as Nurse Practitioner (Nurse Practitioner) [facility] [place] [facility] Harle StanfordKimbrough Jr., MD as Consulting Physician (Urology) Rachael Feeaniel P Jacobs, MD as Consulting Physician (Gastroenterology) Nilda Simmerobert A Wainer, MD as Consulting Physician (Orthopedic Surgery) Noralee StainLaura L Lomax, MD as Consulting Physician (Dermatology)   HPI  Crystal CaoSara F. Glendell Farley is a 80 y.o. female  seen in followup for sinus node dysfunction chronotropic incompetence which has a pacemaker implanted.  She is doing quite well She is in a wheel chair She hurt her back.  She also has macular degeneration and she is concerned about the potential of anticoagulation to affect her vision  The patient denies chest pain, shortness of breath, nocturnal dyspnea, orthopnea or peripheral edema. There have been no palpitations, lightheadedness or syncope.    Past Medical History  Diagnosis Date  . HTN (hypertension)   . Other and unspecified hyperlipidemia   . DM (diabetes mellitus)   . Hyperthyroidism   . Hemorrhoids   . Legally blind     secondary to  macular degeneration  . AF (atrial fibrillation)   . Rectal bleeding   . Sinus node dysfunction   . Hip fracture   . Bradycardia   . Aortic stenosis   . Mitral valve prolapse   . Hearing loss   . Memory loss 02/16/2012  . Disturbance of salivary secretion [id number]  . Unspecified hereditary and idiopathic peripheral neuropathy 10/28/202012  . Closed fracture of unspecified trochanteric section of femur 05/29/2011  . Anxiety state, unspecified 08/19/2010  . Diffuse cystic mastopathy 12/23/2009  . Flatulence, eructation, and gas pain 12/03/2009  . Closed fracture of lumbar vertebra without mention of spinal cord injury [id number]  . Personal history of fall 01/29/2009  . Occlusion and stenosis of carotid artery without mention of cerebral infarction 12/27/2007  . Syncope and collapse 12/27/2007    . Other malaise and fatigue 12/06/2007  . Unspecified urinary incontinence 02/22/2005  . Cardiac pacemaker in situ 09/12/2004  . Unspecified hypothyroidism 03/18/2010  . Abnormality of gait 09/08/2003  . Edema 08/18/2003  . Macular degeneration (senile) of retina, unspecified 12/12/2001  . Undiagnosed cardiac murmurs 04/11/2001  . Senile osteoporosis 12/13/2000  . [place] blindness, [place] 03/20/1999    Past Surgical History  Procedure Laterality Date  . Pacemaker insertion  2006    Medtronic In-Pulse  . Left hip surgery  09/2005  . Right hip surgery  08/2003  . 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 Tonsillectomy    . Breast biopsy Left 1970    benign    Current Outpatient Prescriptions  Medication Sig Dispense Refill  . acetaminophen (TYLENOL) 325 MG tablet Take 650 mg by mouth every 4 (four) hours as needed.       Marland Kitchen. amLODipine (NORVASC) 10 MG tablet Take 10 mg by mouth Daily.      . calcium-vitamin D (OSCAL WITH D) 500-200 MG-UNIT per tablet Take 1 tablet by mouth 2 (two) times daily.      . Lecithin 1200 MG CAPS Take by mouth daily.      Marland Kitchen. levothyroxine (SYNTHROID, LEVOTHROID) 25 MCG tablet Take 12.5 mg by mouth Daily.      . metFORMIN (GLUCOPHAGE) 500 MG tablet 2 (two) times daily.      . metoprolol succinate (TOPROL-XL) 50 MG 24 hr tablet Take 50  mg by mouth Daily.      . Multiple Vitamins-Minerals (PRESERVISION AREDS PO) Take by mouth. One capsule twice daily      . oxybutynin (DITROPAN-XL) 5 MG 24 hr tablet Take 5 mg by mouth Daily.      . polyethylene glycol powder (GLYCOLAX/MIRALAX) powder Take 17 g by mouth Daily.      . simvastatin (ZOCOR) 10 MG tablet Take 10 mg by mouth Daily.       No current facility-administered medications for this visit.    Allergies  Allergen Reactions  . Codeine   . Ibuprofen   . Lisinopril   . Penicillins   . [place] [Trospium Chloride]     Review of Systems  negative except from HPI and PMH  Physical Exam BP 137/72  Pulse 64  Ht 5\' 3"  (1.6 m) Well developed and well nourished in no acute distress HENT normal E scleral and icterus clear Neck Supple JVP flat; carotids brisk and full Clear to ausculation  Regular rate and rhythm, no murmurs gallops or rub Soft with active bowel sounds No clubbing cyanosis none Edema Alert and oriented, grossly normal motor and sensory function Skin Warm and Dry    Assessment and  Plan  SINUS NODE dysfunction  Stable   Pacemaker- Medtronic  ,The patient's device was interrogated.  The information was reviewed. No changes were made in the programming.

## 2013-09-25 NOTE — Patient Instructions (Signed)
Your physician recommends that you continue on your current medications as directed. Please refer to the Current Medication list given to you today.  Remote monitoring is used to monitor your Pacemaker of ICD from home. This monitoring reduces the number of office visits required to check your device to one time per year. It allows us to keep an eye on the functioning of your device to ensure it is working properly. You are scheduled for a device check from home on 12/30/13. You may send your transmission at any time that day. If you have a wireless device, the transmission will be sent automatically. After your physician reviews your transmission, you will receive a postcard with your next transmission date.  Your physician wants you to follow-up in: 1 year with Dr. Klein.  You will receive a reminder letter in the mail two months in advance. If you don't receive a letter, please call our office to schedule the follow-up appointment.  

## 2013-09-26 ENCOUNTER — Encounter: Payer: Self-pay | Admitting: Nurse Practitioner

## 2013-10-10 ENCOUNTER — Encounter: Payer: Self-pay | Admitting: Nurse Practitioner

## 2013-10-10 ENCOUNTER — Non-Acute Institutional Stay: Payer: 59 | Admitting: Nurse Practitioner

## 2013-10-10 VITALS — BP 152/64 | HR 74 | Wt 132.0 lb

## 2013-10-10 DIAGNOSIS — I10 Essential (primary) hypertension: Secondary | ICD-10-CM

## 2013-10-10 DIAGNOSIS — E039 Hypothyroidism, unspecified: Secondary | ICD-10-CM

## 2013-10-10 DIAGNOSIS — K5900 Constipation, unspecified: Secondary | ICD-10-CM

## 2013-10-10 DIAGNOSIS — R413 Other amnesia: Secondary | ICD-10-CM

## 2013-10-10 DIAGNOSIS — N318 Other neuromuscular dysfunction of bladder: Secondary | ICD-10-CM

## 2013-10-10 DIAGNOSIS — E119 Type 2 diabetes mellitus without complications: Secondary | ICD-10-CM

## 2013-10-10 NOTE — Assessment & Plan Note (Signed)
Fasting CBGs 130s-continue Metformin 500mg  bid, update Hgb A1c in 3 months.

## 2013-10-10 NOTE — Progress Notes (Signed)
Patient ID: [name]. Planck, female   DOB: 1914/08/24, 80 y.o.   MRN: [id number]     Chief Complaint:  Chief Complaint  Patient presents with  . Medical Managment of Chronic Issues    increase anxiety, requires more ADL assist with hygine. With daughter-in-law Elita Quick     HPI: seen today in the clinic @ [facility] for evaluation of her increased anxiety, decline of ADL function, and chronic condition  Problem List Items Addressed This Visit   Unspecified hypothyroidism     Takes Levothyroxine 12.9mcg, last TSH 2.991 12/20/12 and 2.5 08/27/13     Unspecified constipation     Loose stools daily, will change MiraLax to qod.     Memory deficits     Confused time, place, person-more anxious and requires more assistance with ADLs. Progression of memory lapses, impaired vision and hearing, increased frailty all contribute to her mood and decline in her ADL function. May needs SNF in future.      Hypertonicity of bladder     Better with Oxybutynin 5mg  daily.       HYPERTENSION     Controlled on Metoprolol 50mg  daily and Amlodipine 10mg  daily    DM - Primary     Fasting CBGs 130s-continue Metformin 500mg  bid, update Hgb A1c in 3 months.        Review of Systems:  Review of Systems  Constitutional: Positive for malaise/fatigue. Negative for fever, chills, weight loss and diaphoresis.  HENT: Positive for hearing loss (severe with hearing aids. ). Negative for congestion, ear discharge, ear pain, nosebleeds, sore throat and tinnitus.   Eyes: Positive for blurred vision (macular degeneration ). Negative for pain, discharge and redness.  Respiratory: Negative for cough, hemoptysis, sputum production, shortness of breath, wheezing and stridor.   Cardiovascular: Negative for chest pain, palpitations, orthopnea, claudication, leg swelling and PND.  Gastrointestinal: Negative for heartburn, nausea, vomiting, abdominal pain, diarrhea, constipation, blood in stool and melena.  Genitourinary: Positive  for frequency (chronic). Negative for dysuria, urgency, hematuria and flank pain.  Musculoskeletal: Positive for falls. Negative for back pain, joint pain, myalgias and neck pain.  Skin: Negative for itching and rash.  Neurological: Negative for dizziness, tingling, tremors, sensory change, speech change, focal weakness, seizures, loss of consciousness, weakness and headaches.  Endo/Heme/Allergies: Negative for environmental allergies and polydipsia. Does not bruise/bleed easily.  Psychiatric/Behavioral: Positive for depression and memory loss. Negative for hallucinations. The patient is not nervous/anxious and does not have insomnia.      Medications: Patient's Medications  New Prescriptions   No medications on file  Previous Medications   ACETAMINOPHEN (TYLENOL) 325 MG TABLET    Take 650 mg by mouth every 4 (four) hours as needed.    AMLODIPINE (NORVASC) 10 MG TABLET    Take 10 mg by mouth Daily.   CALCIUM-VITAMIN D (OSCAL WITH D) 500-200 MG-UNIT PER TABLET    Take 1 tablet by mouth 2 (two) times daily.   LECITHIN 1200 MG CAPS    Take by mouth daily.   LEVOTHYROXINE (SYNTHROID, LEVOTHROID) 25 MCG TABLET    Take 12.5 mg by mouth Daily.   MAGNESIUM HYDROXIDE (MILK OF MAGNESIA) 400 MG/5ML SUSPENSION    Take 30 mLs by mouth. Every other day for constipation   METFORMIN (GLUCOPHAGE) 500 MG TABLET    2 (two) times daily.   METOPROLOL SUCCINATE (TOPROL-XL) 50 MG 24 HR TABLET    Take 50 mg by mouth Daily.   MULTIPLE VITAMINS-MINERALS (PRESERVISION AREDS PO)  Take by mouth. One capsule twice daily   OXYBUTYNIN (DITROPAN-XL) 5 MG 24 HR TABLET    Take 5 mg by mouth Daily.   POLYETHYLENE GLYCOL POWDER (GLYCOLAX/MIRALAX) POWDER    Take 17 g by mouth Daily.   SIMVASTATIN (ZOCOR) 10 MG TABLET    Take 10 mg by mouth Daily.  Modified Medications   No medications on file  Discontinued Medications   No medications on file     Physical Exam: Physical Exam  Constitutional: She is oriented to  person, place, and time. She appears well-developed and well-nourished.  HENT:  Head: Normocephalic and atraumatic.  Right Ear: External ear normal.  Left Ear: External ear normal.  Nose: Nose normal.  Mouth/Throat: Oropharynx is clear and moist. No oropharyngeal exudate.  Eyes: Conjunctivae and EOM are normal. Pupils are equal, round, and reactive to light. Right eye exhibits no discharge. Left eye exhibits no discharge. No scleral icterus.  Neck: Normal range of motion. Neck supple. No JVD present. No thyromegaly present.  Cardiovascular: Normal rate.   Murmur heard. Pacemaker. Systolic ejection murmur 3/6  Pulmonary/Chest: Effort normal and breath sounds normal. No respiratory distress. She has no wheezes. She has no rales. She exhibits no tenderness.  Abdominal: Soft. Bowel sounds are normal. She exhibits no distension. There is no tenderness. There is no rebound.  Musculoskeletal: Normal range of motion. She exhibits no edema and no tenderness.  Lymphadenopathy:    She has no cervical adenopathy.  Neurological: She is alert and oriented to person, place, and time. She has normal reflexes. No cranial nerve deficit. She exhibits normal muscle tone. Coordination normal.  Skin: Skin is warm and dry. No rash noted. She is not diaphoretic. No erythema. No pallor.  Psychiatric: Thought content normal. Her mood appears not anxious. Her affect is not angry, not blunt, not labile and not inappropriate. Her speech is not rapid and/or pressured, not delayed and not slurred. She is slowed. She is not agitated, not aggressive, not hyperactive, not withdrawn, not actively hallucinating and not combative. Thought content is not paranoid and not delusional. Cognition and memory are impaired. She expresses impulsivity and inappropriate judgment. She exhibits a depressed mood. She exhibits abnormal recent memory.  Flat affect. Confusion-going to bed before dinner frequently.  She is attentive.     Filed  Vitals:   10/10/13 1417  BP: 152/64  Pulse: 74  Weight: 132 lb (59.875 kg)      Labs reviewed: Basic Metabolic Panel:  Recent Labs  16/05/9604/22/14 08/27/13  NA 138 141  K 4.0 3.9  BUN 22* 18  CREATININE 0.9 0.9  TSH 2.99 2.50    Liver Function Tests:  Recent Labs  12/20/12 08/27/13  AST 24 37*  ALT 20 23  ALKPHOS 88 82    CBC:  Recent Labs  12/20/12 08/27/13  WBC 9.5 8.3  HGB 14.1 15.2  HCT 41 44  PLT 273 296    Assessment/Plan DM Fasting CBGs 130s-continue Metformin 500mg  bid, update Hgb A1c in 3 months.   Unspecified constipation Loose stools daily, will change MiraLax to qod.   Unspecified hypothyroidism Takes Levothyroxine 12.635mcg, last TSH 2.991 12/20/12 and 2.5 08/27/13   HYPERTENSION Controlled on Metoprolol 50mg  daily and Amlodipine 10mg  daily  Hypertonicity of bladder Better with Oxybutynin 5mg  daily.     Memory deficits Confused time, place, person-more anxious and requires more assistance with ADLs. Progression of memory lapses, impaired vision and hearing, increased frailty all contribute to her mood and decline in  her ADL function. May needs SNF in future.        Family/ staff Communication: safety   Goals of care: AL   Labs/tests ordered Hgb A1c.

## 2013-10-10 NOTE — Assessment & Plan Note (Signed)
Loose stools daily, will change MiraLax to qod.

## 2013-10-12 NOTE — Assessment & Plan Note (Addendum)
Takes Levothyroxine 12.25mcg, last TSH 2.991 12/20/12 and 2.5 08/27/13

## 2013-10-12 NOTE — Assessment & Plan Note (Signed)
Better with Oxybutynin 5mg daily.    

## 2013-10-12 NOTE — Assessment & Plan Note (Signed)
Confused time, place, person-more anxious and requires more assistance with ADLs. Progression of memory lapses, impaired vision and hearing, increased frailty all contribute to her mood and decline in her ADL function. May needs SNF in future.

## 2013-10-12 NOTE — Assessment & Plan Note (Signed)
Controlled on Metoprolol 50mg daily and Amlodipine 10mg daily  

## 2013-11-27 ENCOUNTER — Encounter: Payer: Self-pay | Admitting: Internal Medicine

## 2013-12-30 ENCOUNTER — Ambulatory Visit (INDEPENDENT_AMBULATORY_CARE_PROVIDER_SITE_OTHER): Payer: Medicare PPO | Admitting: *Deleted

## 2013-12-30 ENCOUNTER — Telehealth: Payer: Self-pay | Admitting: Cardiology

## 2013-12-30 ENCOUNTER — Encounter: Payer: Self-pay | Admitting: Internal Medicine

## 2013-12-30 DIAGNOSIS — I495 Sick sinus syndrome: Secondary | ICD-10-CM

## 2013-12-30 NOTE — Progress Notes (Signed)
Remote pacemaker transmission.   

## 2013-12-30 NOTE — Telephone Encounter (Signed)
Spoke with pt daughter in Social worker. Pt daughter in law stated that she had been called by facility that pt is in and they are unable to send transmission. I gave her the customer support number for [facility] to call.

## 2013-12-31 LAB — MDC_IDC_ENUM_SESS_TYPE_REMOTE
Battery Impedance: 2346 Ohm
Battery Voltage: 2.73 V
Brady Statistic AP VS Percent: 93 %
Brady Statistic AS VS Percent: 7 %
Lead Channel Impedance Value: 754 Ohm
Lead Channel Pacing Threshold Amplitude: 0.625 V
Lead Channel Pacing Threshold Pulse Width: 0.4 ms
Lead Channel Pacing Threshold Pulse Width: 0.4 ms
Lead Channel Sensing Intrinsic Amplitude: 8 mV
Lead Channel Setting Pacing Amplitude: 2 V
Lead Channel Setting Pacing Pulse Width: 0.4 ms
Lead Channel Setting Sensing Sensitivity: 4 mV
MDC IDC MSMT BATTERY REMAINING LONGEVITY: 23 mo
MDC IDC MSMT LEADCHNL RA IMPEDANCE VALUE: 503 Ohm
MDC IDC MSMT LEADCHNL RA PACING THRESHOLD AMPLITUDE: 0.375 V
MDC IDC SESS DTM: 20150601192412
MDC IDC SET LEADCHNL RA PACING AMPLITUDE: 1.5 V
MDC IDC STAT BRADY AP VP PERCENT: 0 %
MDC IDC STAT BRADY AS VP PERCENT: 0 %

## 2014-01-02 ENCOUNTER — Non-Acute Institutional Stay: Payer: 59 | Admitting: Nurse Practitioner

## 2014-01-02 ENCOUNTER — Encounter: Payer: Self-pay | Admitting: Nurse Practitioner

## 2014-01-02 DIAGNOSIS — K5900 Constipation, unspecified: Secondary | ICD-10-CM

## 2014-01-02 DIAGNOSIS — I10 Essential (primary) hypertension: Secondary | ICD-10-CM

## 2014-01-02 DIAGNOSIS — E039 Hypothyroidism, unspecified: Secondary | ICD-10-CM

## 2014-01-02 DIAGNOSIS — E119 Type 2 diabetes mellitus without complications: Secondary | ICD-10-CM

## 2014-01-02 DIAGNOSIS — N318 Other neuromuscular dysfunction of bladder: Secondary | ICD-10-CM

## 2014-01-02 DIAGNOSIS — N390 Urinary tract infection, site not specified: Secondary | ICD-10-CM

## 2014-01-02 NOTE — Assessment & Plan Note (Signed)
MiraLax qod. Stable.

## 2014-01-02 NOTE — Assessment & Plan Note (Signed)
Takes Levothyroxine 12.100mcg, last TSH 2.991 12/20/12 and 2.5 08/27/13. Update TSH

## 2014-01-02 NOTE — Progress Notes (Signed)
Patient ID: [name]. [name], female   DOB: July 12, 1915, 80 y.o.   MRN: [id number]     Chief Complaint:  Chief Complaint  Patient presents with  . Medical Management of Chronic Issues  . Acute Visit    UTI     HPI: seen today in the [facility]  @ [facility] for evaluation of UTI and chronic condition  Problem List Items Addressed This Visit   Urinary tract infection, site not specified - Primary     12/27/13 [name] treated with 7 day course of Cipro 250mg  bid.      Unspecified hypothyroidism     Takes Levothyroxine 12.22mcg, last TSH 2.991 12/20/12 and 2.5 08/27/13. Update TSH     Unspecified constipation     MiraLax qod. Stable.      Hypertonicity of bladder     Better with Oxybutynin 5mg  daily.        HYPERTENSION     Controlled on Metoprolol 50mg  daily and Amlodipine 10mg  daily     DM     takes Metformin 500mg  bid, update Hgb A1c         Review of Systems:  Review of Systems  Constitutional: Positive for malaise/fatigue. Negative for fever, chills, weight loss and diaphoresis.  HENT: Positive for hearing loss (severe with hearing aids. ). Negative for congestion, ear discharge, ear pain, nosebleeds, sore throat and tinnitus.   Eyes: Positive for blurred vision (macular degeneration ). Negative for pain, discharge and redness.  Respiratory: Negative for cough, hemoptysis, sputum production, shortness of breath, wheezing and stridor.   Cardiovascular: Negative for chest pain, palpitations, orthopnea, claudication, leg swelling and PND.  Gastrointestinal: Negative for heartburn, nausea, vomiting, abdominal pain, diarrhea, constipation, blood in stool and melena.  Genitourinary: Positive for frequency (chronic). Negative for dysuria, urgency, hematuria and flank pain.  Musculoskeletal: Positive for falls. Negative for back pain, joint pain, myalgias and neck pain.  Skin: Negative for itching and rash.  Neurological: Negative for dizziness, tingling, tremors, sensory change,  speech change, focal weakness, seizures, loss of consciousness, weakness and headaches.  Endo/Heme/Allergies: Negative for environmental allergies and polydipsia. Does not bruise/bleed easily.  Psychiatric/Behavioral: Positive for depression and memory loss. Negative for hallucinations. The patient is not nervous/anxious and does not have insomnia.      Medications: Patient's Medications  New Prescriptions   No medications on file  Previous Medications   ACETAMINOPHEN (TYLENOL) 325 MG TABLET    Take 650 mg by mouth every 4 (four) hours as needed.    AMLODIPINE (NORVASC) 10 MG TABLET    Take 10 mg by mouth Daily.   CALCIUM-VITAMIN D (OSCAL WITH D) 500-200 MG-UNIT PER TABLET    Take 1 tablet by mouth 2 (two) times daily.   LECITHIN 1200 MG CAPS    Take by mouth daily.   LEVOTHYROXINE (SYNTHROID, LEVOTHROID) 25 MCG TABLET    Take 12.5 mg by mouth Daily.   MAGNESIUM HYDROXIDE (MILK OF MAGNESIA) 400 MG/5ML SUSPENSION    Take 30 mLs by mouth. Every other day for constipation   METFORMIN (GLUCOPHAGE) 500 MG TABLET    2 (two) times daily.   METOPROLOL SUCCINATE (TOPROL-XL) 50 MG 24 HR TABLET    Take 50 mg by mouth Daily.   MULTIPLE VITAMINS-MINERALS (PRESERVISION AREDS PO)    Take by mouth. One capsule twice daily   OXYBUTYNIN (DITROPAN-XL) 5 MG 24 HR TABLET    Take 5 mg by mouth Daily.   POLYETHYLENE GLYCOL POWDER (GLYCOLAX/MIRALAX) POWDER  Take 17 g by mouth Daily.   SIMVASTATIN (ZOCOR) 10 MG TABLET    Take 10 mg by mouth Daily.  Modified Medications   No medications on file  Discontinued Medications   No medications on file     Physical Exam: Physical Exam  Constitutional: She is oriented to person, place, and time. She appears well-developed and well-nourished.  HENT:  Head: Normocephalic and atraumatic.  Right Ear: External ear normal.  Left Ear: External ear normal.  Nose: Nose normal.  Mouth/Throat: Oropharynx is clear and moist. No oropharyngeal exudate.  Eyes: Conjunctivae  and EOM are normal. Pupils are equal, round, and reactive to light. Right eye exhibits no discharge. Left eye exhibits no discharge. No scleral icterus.  Neck: Normal range of motion. Neck supple. No JVD present. No thyromegaly present.  Cardiovascular: Normal rate.   Murmur heard. Pacemaker. Systolic ejection murmur 3/6  Pulmonary/Chest: Effort normal and breath sounds normal. No respiratory distress. She has no wheezes. She has no rales. She exhibits no tenderness.  Abdominal: Soft. Bowel sounds are normal. She exhibits no distension. There is no tenderness. There is no rebound.  Musculoskeletal: Normal range of motion. She exhibits no edema and no tenderness.  Lymphadenopathy:    She has no cervical adenopathy.  Neurological: She is alert and oriented to person, place, and time. She has normal reflexes. No cranial nerve deficit. She exhibits normal muscle tone. Coordination normal.  Skin: Skin is warm and dry. No rash noted. She is not diaphoretic. No erythema. No pallor.  Psychiatric: Thought content normal. Her mood appears not anxious. Her affect is not angry, not blunt, not labile and not inappropriate. Her speech is not rapid and/or pressured, not delayed and not slurred. She is slowed. She is not agitated, not aggressive, not hyperactive, not withdrawn, not actively hallucinating and not combative. Thought content is not paranoid and not delusional. Cognition and memory are impaired. She expresses impulsivity and inappropriate judgment. She exhibits a depressed mood. She exhibits abnormal recent memory.  Flat affect. Confusion-going to bed before dinner frequently.  She is attentive.     Filed Vitals:   01/02/14 1112  BP: 115/55  Pulse: 61  Temp: 98 F (36.7 C)  TempSrc: Tympanic  Resp: 17      Labs reviewed: Basic Metabolic Panel:  Recent Labs  40/98/1101/27/15  NA 141  K 3.9  BUN 18  CREATININE 0.9  TSH 2.50    Liver Function Tests:  Recent Labs  08/27/13  AST 37*    ALT 23  ALKPHOS 82    CBC:  Recent Labs  08/27/13  WBC 8.3  HGB 15.2  HCT 44  PLT 296    Assessment/Plan Urinary tract infection, site not specified 12/27/13 [name] treated with 7 day course of Cipro 250mg  bid.    DM takes Metformin 500mg  bid, update Hgb A1c    Unspecified constipation MiraLax qod. Stable.    Unspecified hypothyroidism Takes Levothyroxine 12.105mcg, last TSH 2.991 12/20/12 and 2.5 08/27/13. Update TSH   HYPERTENSION Controlled on Metoprolol 50mg  daily and Amlodipine 10mg  daily   Hypertonicity of bladder Better with Oxybutynin 5mg  daily.          Family/ staff Communication: safety   Goals of care: AL   Labs/tests ordered urine culture done 12/27/13. Hgb A1c and TSH

## 2014-01-02 NOTE — Assessment & Plan Note (Signed)
takes Metformin 500mg  bid, update Hgb A1c

## 2014-01-02 NOTE — Assessment & Plan Note (Signed)
12/27/13 C. Koseri treated with 7 day course of Cipro 250mg  bid.

## 2014-01-02 NOTE — Assessment & Plan Note (Signed)
Better with Oxybutynin 5mg daily.    

## 2014-01-02 NOTE — Assessment & Plan Note (Signed)
Controlled on Metoprolol 50mg  daily and Amlodipine 10mg  daily

## 2014-01-09 LAB — HEMOGLOBIN A1C: Hgb A1c MFr Bld: 6.5 % — AB (ref 4.0–6.0)

## 2014-01-16 ENCOUNTER — Other Ambulatory Visit: Payer: Self-pay | Admitting: Nurse Practitioner

## 2014-01-16 DIAGNOSIS — E119 Type 2 diabetes mellitus without complications: Secondary | ICD-10-CM

## 2014-01-16 LAB — TSH: TSH: 2.47 u[IU]/mL (ref 0.41–5.90)

## 2014-01-16 LAB — HEMOGLOBIN A1C: HEMOGLOBIN A1C: 6.5 % — AB (ref 4.0–6.0)

## 2014-01-23 ENCOUNTER — Other Ambulatory Visit: Payer: Self-pay | Admitting: Nurse Practitioner

## 2014-01-23 DIAGNOSIS — E119 Type 2 diabetes mellitus without complications: Secondary | ICD-10-CM

## 2014-01-23 DIAGNOSIS — E039 Hypothyroidism, unspecified: Secondary | ICD-10-CM

## 2014-01-29 ENCOUNTER — Encounter: Payer: Self-pay | Admitting: Cardiology

## 2014-03-06 ENCOUNTER — Non-Acute Institutional Stay: Payer: 59 | Admitting: Internal Medicine

## 2014-03-06 ENCOUNTER — Encounter: Payer: Self-pay | Admitting: Internal Medicine

## 2014-03-06 VITALS — BP 142/58 | HR 60 | Temp 98.9°F

## 2014-03-06 DIAGNOSIS — R413 Other amnesia: Secondary | ICD-10-CM

## 2014-03-06 DIAGNOSIS — W19XXXA Unspecified fall, initial encounter: Secondary | ICD-10-CM

## 2014-03-06 DIAGNOSIS — S0093XA Contusion of unspecified part of head, initial encounter: Secondary | ICD-10-CM

## 2014-03-06 DIAGNOSIS — H542X22 Low vision right eye category 2, low vision left eye category 2: Secondary | ICD-10-CM

## 2014-03-06 DIAGNOSIS — E039 Hypothyroidism, unspecified: Secondary | ICD-10-CM

## 2014-03-06 DIAGNOSIS — E119 Type 2 diabetes mellitus without complications: Secondary | ICD-10-CM

## 2014-03-06 DIAGNOSIS — E785 Hyperlipidemia, unspecified: Secondary | ICD-10-CM

## 2014-03-06 DIAGNOSIS — S1093XA Contusion of unspecified part of neck, initial encounter: Secondary | ICD-10-CM

## 2014-03-06 DIAGNOSIS — S0083XA Contusion of other part of head, initial encounter: Secondary | ICD-10-CM

## 2014-03-06 DIAGNOSIS — R102 Pelvic and perineal pain: Secondary | ICD-10-CM

## 2014-03-06 DIAGNOSIS — S0003XA Contusion of scalp, initial encounter: Secondary | ICD-10-CM

## 2014-03-06 DIAGNOSIS — N949 Unspecified condition associated with female genital organs and menstrual cycle: Secondary | ICD-10-CM

## 2014-03-06 DIAGNOSIS — Z299 Encounter for prophylactic measures, unspecified: Secondary | ICD-10-CM

## 2014-03-06 DIAGNOSIS — I10 Essential (primary) hypertension: Secondary | ICD-10-CM

## 2014-03-06 NOTE — Progress Notes (Signed)
Patient ID: [name] DockerCooke, female   DOB: 1914-11-06, 80 y.o.   MRN: [id number]    Location:  [facility]   Place of Service: Clinic (12)  Extended Emergency Contact Information Primary Emergency Contact: Cook,[name] Address: [address]          [place], [place] Home Phone: [phone] Mobile Phone: [phone] Relation: Son Secondary Emergency Contact: Jorene Guestooke,[name]          [place], [place] [address] [place] Home Phone: [phone] Relation: None   Chief Complaint  Patient presents with  . Medical Management of Chronic Issues    Comprehensive exam: blood pressure, blood sugar, thyroid. Here with daughter-in-law Elita Quickam  . Fall    this morning c/o left hip joint pain, can't stand up to weight, upper leg hurts, has a bump back top of head.     HPI:   Preventive measure - Plan: DNR (Do Not Resuscitate)  Fall, initial encounter: Larey SeatFell today and has been having pain in the left ischium area. Bumped her occiput as well.  Pain in female pelvis: left ischium area  Head contusion, initial encounter: neurologically unchanged  DM: controlled  HYPERLIPIDEMIA: controlled  HYPERTENSION: controlled  Unspecified hypothyroidism: compensated  Severe vision impairment of both eyes: legally blind  Memory deficits: slow progression in the past. Unchanged since last visit.    Past Medical History  Diagnosis Date  . HTN (hypertension)   . Other and unspecified hyperlipidemia   . DM (diabetes mellitus)   . Hyperthyroidism   . Hemorrhoids   . Legally blind     secondary to  macular degeneration  . AF (atrial fibrillation)   . Rectal bleeding   . Sinus node dysfunction   . Hip fracture   . Bradycardia   . Aortic stenosis   . Mitral valve prolapse   . Hearing loss   . Memory loss 02/16/2012  . Disturbance of salivary secretion [id number]  . Unspecified hereditary and idiopathic peripheral neuropathy 10/28/202012  . Closed fracture of  unspecified trochanteric section of femur 05/29/2011  . Anxiety state, unspecified 08/19/2010  . Diffuse cystic mastopathy 12/23/2009  . Flatulence, eructation, and gas pain 12/03/2009  . Closed fracture of lumbar vertebra without mention of spinal cord injury 1Apr 13, 202010  . Personal history of fall 01/29/2009  . Occlusion and stenosis of carotid artery without mention of cerebral infarction 12/27/2007  . Syncope and collapse 12/27/2007  . Other malaise and fatigue 12/06/2007  . Unspecified urinary incontinence 02/22/2005  . Cardiac pacemaker in situ 09/12/2004  . Unspecified hypothyroidism 03/18/2010  . Abnormality of gait 09/08/2003  . Edema 08/18/2003  . Macular degeneration (senile) of retina, unspecified 12/12/2001  . Undiagnosed cardiac murmurs 04/11/2001  . Senile osteoporosis 12/13/2000  . Legal blindness, as defined in [place] 03/20/1999    Past Surgical History  Procedure Laterality Date  . Pacemaker insertion  2006    Medtronic In-Pulse  . Left hip surgery  09/2005  . Right hip surgery  08/2003  . 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 Tonsillectomy    . Breast biopsy Left 1970    benign    History   Social History  . Marital Status: Widowed    Spouse Name: N/A    Number of Children: N/A  . Years of Education: N/A   Occupational History  . Not on file.   Social History Main Topics  .  Smoking status: Never Smoker   . Smokeless tobacco: Never Used  . Alcohol Use: No  . Drug Use: No  . Sexual Activity: No   Other Topics Concern  . Not on file   Social History Narrative   Lives at [facility] since 2010 [facility]   Widowed   Never smoked   Alcohol none   Exercise none   Walks with walker   DNR/ Living Will     reports that she has never smoked. She has never used smokeless tobacco. She reports that she does not drink alcohol or use illicit drugs.  Immunization History  Administered Date(s)  Administered  . Influenza-Unspecified 06/06/2013  . PPD Test 07/23/2011  . Pneumococcal Polysaccharide-23 03/14/2009  . Td 03/14/2009  . Zoster 06/29/2006    Allergies  Allergen Reactions  . Codeine   . Ibuprofen   . Lisinopril   . Penicillins   . Sanctura [Trospium Chloride]     Medications: Patient's Medications  New Prescriptions   No medications on file  Previous Medications   ACETAMINOPHEN (TYLENOL) 325 MG TABLET    Take 650 mg by mouth every 4 (four) hours as needed.    AMLODIPINE (NORVASC) 10 MG TABLET    Take 10 mg by mouth Daily.   CALCIUM-VITAMIN D (OSCAL WITH D) 500-200 MG-UNIT PER TABLET    Take 1 tablet by mouth 2 (two) times daily.   LECITHIN 1200 MG CAPS    Take by mouth daily.   LEVOTHYROXINE (SYNTHROID, LEVOTHROID) 25 MCG TABLET    Take 12.5 mg by mouth Daily.   MAGNESIUM HYDROXIDE (MILK OF MAGNESIA) 400 MG/5ML SUSPENSION    Take 30 mLs by mouth. Every other day for constipation   METFORMIN (GLUCOPHAGE) 500 MG TABLET    2 (two) times daily.   METOPROLOL SUCCINATE (TOPROL-XL) 50 MG 24 HR TABLET    Take 50 mg by mouth Daily.   MULTIPLE VITAMINS-MINERALS (PRESERVISION AREDS PO)    Take by mouth. One capsule twice daily   OXYBUTYNIN (DITROPAN-XL) 5 MG 24 HR TABLET    Take 5 mg by mouth Daily.   POLYETHYLENE GLYCOL POWDER (GLYCOLAX/MIRALAX) POWDER    Take 17 g by mouth Daily.   SIMVASTATIN (ZOCOR) 10 MG TABLET    Take 10 mg by mouth Daily.  Modified Medications   No medications on file  Discontinued Medications   No medications on file     Review of Systems  Filed Vitals:   03/06/14 1450  BP: 142/58  Pulse: 60  Temp: 98.9 F (37.2 C)  TempSrc: Oral   Body mass index is 0.00 kg/(m^2).  Physical Exam  Constitutional: She is oriented to person, place, and time. She appears well-developed and well-nourished.  HENT:  Head: Normocephalic and atraumatic.  Right Ear: External ear normal.  Left Ear: External ear normal.  Nose: Nose normal.  Mouth/Throat:  Oropharynx is clear and moist. No oropharyngeal exudate.  Eyes: Conjunctivae and EOM are normal. Pupils are equal, round, and reactive to light. Right eye exhibits no discharge. Left eye exhibits no discharge. No scleral icterus.  Neck: Normal range of motion. Neck supple. No JVD present. No thyromegaly present.  Cardiovascular: Normal rate.   Murmur heard. Pacemaker. Systolic ejection murmur 3/6  Pulmonary/Chest: Effort normal and breath sounds normal. No respiratory distress. She has no wheezes. She has no rales. She exhibits no tenderness.  Abdominal: Soft. Bowel sounds are normal. She exhibits no distension. There is no tenderness. There is no rebound.  Musculoskeletal: Normal range of motion. She exhibits tenderness (left ischium). She exhibits no edema.  Unable to bear weight and stand. Tender at occiput.  Lymphadenopathy:    She has no cervical adenopathy.  Neurological: She is alert and oriented to person, place, and time. She has normal reflexes. No cranial nerve deficit. She exhibits normal muscle tone. Coordination normal.  Skin: Skin is warm and dry. No rash noted. She is not diaphoretic. No erythema. No pallor.  Psychiatric: Thought content normal. Her mood appears not anxious. Her affect is not angry, not blunt, not labile and not inappropriate. Her speech is not rapid and/or pressured, not delayed and not slurred. She is slowed. She is not agitated, not aggressive, not hyperactive, not withdrawn, not actively hallucinating and not combative. Thought content is not paranoid and not delusional. Cognition and memory are impaired. She expresses impulsivity and inappropriate judgment. She exhibits a depressed mood. She exhibits abnormal recent memory.  Flat affect. Confusion-going to bed before dinner frequently.  She is attentive.     Labs reviewed: Lab on 01/23/2014  Component Date Value Ref Range Status  . Hemoglobin A1C 01/16/2014 6.5* 4.0 - 6.0 % Final  . TSH 01/16/2014 2.47   0.41 - 5.90 uIU/mL Final  Lab on 01/16/2014  Component Date Value Ref Range Status  . Hemoglobin A1C 01/09/2014 6.5* 4.0 - 6.0 % Final  Clinical Support on 12/30/2013  Component Date Value Ref Range Status  . Date Time Interrogation Session 12/31/2013 [id number]   Final  . Pulse Generator Manufacturer 12/31/2013 Medtronic   Final  . Pulse Gen Model 12/31/2013 [id number] EnPulse   Final  . Pulse Gen Serial Number 12/31/2013 [id number]   Final  . RV Sense Sensitivity 12/31/2013 4   Final  . RA Pace Amplitude 12/31/2013 1.5   Final  . RV Pace PulseWidth 12/31/2013 0.4   Final  . RV Pace Amplitude 12/31/2013 2   Final  . RA Impedance 12/31/2013 503   Final  . RA Pacing Amplitude 12/31/2013 0.375   Final  . RA Pacing PulseWidth 12/31/2013 0.4   Final  . RV IMPEDANCE 12/31/2013 754   Final  . RV Amplitude 12/31/2013 8.0   Final  . RV Pacing Amplitude 12/31/2013 0.625   Final  . RV Pacing PulseWidth 12/31/2013 0.4   Final  . Battery Status 12/31/2013 Unknown   Final  . Battery Longevity 12/31/2013 23   Final  . Battery Voltage 12/31/2013 2.73   Final  . Battery Impedance 12/31/2013 2346   Final  . Huston Foley AP VP Percent 12/31/2013 0   Final  . Huston Foley AS VP Percent 12/31/2013 0   Final  . Huston Foley AP VS Percent 12/31/2013 93   Final  . Brady AS VS Percent 12/31/2013 7   Final  . Eval Rhythm 12/31/2013 Ap/Vs w/ 1st degree block   Final  . Miscellaneous Comment 12/31/2013    Final                   Value:Pacemaker remote check. Device function reviewed. Impedance, sensing, auto capture thresholds consistent with previous measurements. Histograms appropriate for patient and level of activity. All other diagnostic data reviewed and is appropriate and                          stable for patient. Real time/magnet EGM shows appropriate sensing and capture. No mode switch or ventricular high rate episodes. Estimated longevity 23months. Carelink 04/02/14 & ROV  w/ SK 2/16.      Assessment/Plan  1.  Preventive measure - DNR (Do Not Resuscitate)  2. Fall, initial encounter At risk for falls  3. Pain in female pelvis Left ischium area. No bruise. -xray pelvis  4. Head contusion, initial encounter obseerve  5. DM controlled  6. HYPERLIPIDEMIA controlled  7. HYPERTENSION controlled  8. Unspecified hypothyroidism compensated  9. Severe vision impairment of both eyes Legally blind. Unchanged  10. Memory deficits Unchanged.

## 2014-03-08 ENCOUNTER — Emergency Department (HOSPITAL_COMMUNITY): Payer: Medicare PPO

## 2014-03-08 ENCOUNTER — Inpatient Hospital Stay (HOSPITAL_COMMUNITY)
Admission: EM | Admit: 2014-03-08 | Discharge: 2014-03-11 | DRG: 536 | Disposition: A | Discharge status: Home / Self Care | Payer: Medicare PPO | Attending: Internal Medicine | Admitting: Internal Medicine

## 2014-03-08 ENCOUNTER — Encounter (HOSPITAL_COMMUNITY): Payer: Self-pay | Admitting: Emergency Medicine

## 2014-03-08 DIAGNOSIS — N318 Other neuromuscular dysfunction of bladder: Secondary | ICD-10-CM | POA: Diagnosis present

## 2014-03-08 DIAGNOSIS — K5900 Constipation, unspecified: Secondary | ICD-10-CM | POA: Diagnosis present

## 2014-03-08 DIAGNOSIS — E0590 Thyrotoxicosis, unspecified without thyrotoxic crisis or storm: Secondary | ICD-10-CM | POA: Diagnosis present

## 2014-03-08 DIAGNOSIS — I4891 Unspecified atrial fibrillation: Secondary | ICD-10-CM | POA: Diagnosis present

## 2014-03-08 DIAGNOSIS — E039 Hypothyroidism, unspecified: Secondary | ICD-10-CM | POA: Diagnosis present

## 2014-03-08 DIAGNOSIS — Z8249 Family history of ischemic heart disease and other diseases of the circulatory system: Secondary | ICD-10-CM

## 2014-03-08 DIAGNOSIS — S0003XA Contusion of scalp, initial encounter: Secondary | ICD-10-CM | POA: Diagnosis present

## 2014-03-08 DIAGNOSIS — S0083XA Contusion of other part of head, initial encounter: Secondary | ICD-10-CM | POA: Diagnosis present

## 2014-03-08 DIAGNOSIS — F0390 Unspecified dementia without behavioral disturbance: Secondary | ICD-10-CM | POA: Diagnosis present

## 2014-03-08 DIAGNOSIS — H9190 Unspecified hearing loss, unspecified ear: Secondary | ICD-10-CM | POA: Diagnosis present

## 2014-03-08 DIAGNOSIS — S72112A Displaced fracture of greater trochanter of left femur, initial encounter for closed fracture: Secondary | ICD-10-CM

## 2014-03-08 DIAGNOSIS — S72109A Unspecified trochanteric fracture of unspecified femur, initial encounter for closed fracture: Secondary | ICD-10-CM | POA: Diagnosis not present

## 2014-03-08 DIAGNOSIS — K625 Hemorrhage of anus and rectum: Secondary | ICD-10-CM

## 2014-03-08 DIAGNOSIS — E119 Type 2 diabetes mellitus without complications: Secondary | ICD-10-CM

## 2014-03-08 DIAGNOSIS — Z9181 History of falling: Secondary | ICD-10-CM

## 2014-03-08 DIAGNOSIS — Z79899 Other long term (current) drug therapy: Secondary | ICD-10-CM

## 2014-03-08 DIAGNOSIS — I059 Rheumatic mitral valve disease, unspecified: Secondary | ICD-10-CM | POA: Diagnosis present

## 2014-03-08 DIAGNOSIS — S1093XA Contusion of unspecified part of neck, initial encounter: Secondary | ICD-10-CM | POA: Diagnosis present

## 2014-03-08 DIAGNOSIS — Z800 Family history of malignant neoplasm of digestive organs: Secondary | ICD-10-CM

## 2014-03-08 DIAGNOSIS — M25559 Pain in unspecified hip: Secondary | ICD-10-CM | POA: Diagnosis present

## 2014-03-08 DIAGNOSIS — I359 Nonrheumatic aortic valve disorder, unspecified: Secondary | ICD-10-CM | POA: Diagnosis present

## 2014-03-08 DIAGNOSIS — E785 Hyperlipidemia, unspecified: Secondary | ICD-10-CM | POA: Diagnosis present

## 2014-03-08 DIAGNOSIS — S72002A Fracture of unspecified part of neck of left femur, initial encounter for closed fracture: Secondary | ICD-10-CM

## 2014-03-08 DIAGNOSIS — R32 Unspecified urinary incontinence: Secondary | ICD-10-CM | POA: Diagnosis present

## 2014-03-08 DIAGNOSIS — Z880 Allergy status to penicillin: Secondary | ICD-10-CM | POA: Diagnosis not present

## 2014-03-08 DIAGNOSIS — Z66 Do not resuscitate: Secondary | ICD-10-CM

## 2014-03-08 DIAGNOSIS — Z885 Allergy status to narcotic agent status: Secondary | ICD-10-CM | POA: Diagnosis not present

## 2014-03-08 DIAGNOSIS — S72009A Fracture of unspecified part of neck of unspecified femur, initial encounter for closed fracture: Secondary | ICD-10-CM

## 2014-03-08 DIAGNOSIS — G608 Other hereditary and idiopathic neuropathies: Secondary | ICD-10-CM | POA: Diagnosis present

## 2014-03-08 DIAGNOSIS — Z950 Presence of cardiac pacemaker: Secondary | ICD-10-CM | POA: Diagnosis not present

## 2014-03-08 DIAGNOSIS — F411 Generalized anxiety disorder: Secondary | ICD-10-CM | POA: Diagnosis present

## 2014-03-08 DIAGNOSIS — W19XXXA Unspecified fall, initial encounter: Secondary | ICD-10-CM | POA: Diagnosis present

## 2014-03-08 DIAGNOSIS — H548 Legal blindness, as defined in USA: Secondary | ICD-10-CM | POA: Diagnosis present

## 2014-03-08 DIAGNOSIS — R413 Other amnesia: Secondary | ICD-10-CM

## 2014-03-08 DIAGNOSIS — Z888 Allergy status to other drugs, medicaments and biological substances status: Secondary | ICD-10-CM | POA: Diagnosis not present

## 2014-03-08 DIAGNOSIS — I10 Essential (primary) hypertension: Secondary | ICD-10-CM | POA: Diagnosis present

## 2014-03-08 DIAGNOSIS — E113419 Type 2 diabetes mellitus with severe nonproliferative diabetic retinopathy with macular edema, unspecified eye: Secondary | ICD-10-CM | POA: Diagnosis present

## 2014-03-08 DIAGNOSIS — H3530 Unspecified macular degeneration: Secondary | ICD-10-CM | POA: Diagnosis present

## 2014-03-08 DIAGNOSIS — H547 Unspecified visual loss: Secondary | ICD-10-CM

## 2014-03-08 DIAGNOSIS — Y9210 Unspecified residential institution as the place of occurrence of the external cause: Secondary | ICD-10-CM | POA: Diagnosis present

## 2014-03-08 DIAGNOSIS — S72001A Fracture of unspecified part of neck of right femur, initial encounter for closed fracture: Secondary | ICD-10-CM

## 2014-03-08 DIAGNOSIS — F0391 Unspecified dementia with behavioral disturbance: Secondary | ICD-10-CM

## 2014-03-08 HISTORY — DX: Displaced fracture of greater trochanter of left femur, initial encounter for closed fracture: S72.112A

## 2014-03-08 LAB — URINALYSIS, ROUTINE W REFLEX MICROSCOPIC
BILIRUBIN URINE: NEGATIVE
Glucose, UA: NEGATIVE mg/dL
HGB URINE DIPSTICK: NEGATIVE
Ketones, ur: NEGATIVE mg/dL
Leukocytes, UA: NEGATIVE
Nitrite: NEGATIVE
PH: 5.5 (ref 5.0–8.0)
Protein, ur: NEGATIVE mg/dL
Specific Gravity, Urine: 1.019 (ref 1.005–1.030)
Urobilinogen, UA: 0.2 mg/dL (ref 0.0–1.0)

## 2014-03-08 LAB — BASIC METABOLIC PANEL
Anion gap: 11 (ref 5–15)
BUN: 17 mg/dL (ref 6–23)
CO2: 27 meq/L (ref 19–32)
CREATININE: 0.84 mg/dL (ref 0.50–1.10)
Calcium: 9.8 mg/dL (ref 8.4–10.5)
Chloride: 100 mEq/L (ref 96–112)
GFR calc Af Amer: 66 mL/min — ABNORMAL LOW (ref >90)
GFR calc non Af Amer: 57 mL/min — ABNORMAL LOW (ref >90)
Glucose, Bld: 152 mg/dL — ABNORMAL HIGH (ref 70–99)
POTASSIUM: 3.9 meq/L (ref 3.7–5.3)
SODIUM: 138 meq/L (ref 137–147)

## 2014-03-08 LAB — CBC
HCT: 39.6 % (ref 36.0–46.0)
Hemoglobin: 13.6 g/dL (ref 12.0–15.0)
MCH: 29.6 pg (ref 26.0–34.0)
MCHC: 34.3 g/dL (ref 30.0–36.0)
MCV: 86.1 fL (ref 78.0–100.0)
Platelets: 206 10*3/uL (ref 150–400)
RBC: 4.6 MIL/uL (ref 3.87–5.11)
RDW: 12.6 % (ref 11.5–15.5)
WBC: 11.8 10*3/uL — ABNORMAL HIGH (ref 4.0–10.5)

## 2014-03-08 MED ORDER — CALCIUM CARBONATE-VITAMIN D 500-200 MG-UNIT PO TABS
1.0000 | ORAL_TABLET | Freq: Two times a day (BID) | ORAL | Status: DC
  Administered 2014-03-09 – 2014-03-11 (×5): 1 via ORAL
  Filled 2014-03-08 (×7): qty 1

## 2014-03-08 MED ORDER — METOPROLOL SUCCINATE ER 50 MG PO TB24
50.0000 mg | ORAL_TABLET | Freq: Every day | ORAL | Status: DC
  Administered 2014-03-08 – 2014-03-11 (×4): 50 mg via ORAL
  Filled 2014-03-08 (×4): qty 1

## 2014-03-08 MED ORDER — MAGNESIUM HYDROXIDE 400 MG/5ML PO SUSP
30.0000 mL | ORAL | Status: DC
  Administered 2014-03-10: 30 mL via ORAL
  Filled 2014-03-08: qty 30

## 2014-03-08 MED ORDER — METFORMIN HCL 850 MG PO TABS
850.0000 mg | ORAL_TABLET | Freq: Two times a day (BID) | ORAL | Status: DC
  Administered 2014-03-08 – 2014-03-11 (×5): 850 mg via ORAL
  Filled 2014-03-08 (×8): qty 1

## 2014-03-08 MED ORDER — OXYBUTYNIN CHLORIDE ER 5 MG PO TB24
5.0000 mg | ORAL_TABLET | Freq: Every day | ORAL | Status: DC
  Administered 2014-03-09 – 2014-03-10 (×2): 5 mg via ORAL
  Filled 2014-03-08 (×4): qty 1

## 2014-03-08 MED ORDER — HYDROCODONE-ACETAMINOPHEN 5-325 MG PO TABS
2.0000 | ORAL_TABLET | Freq: Once | ORAL | Status: AC
  Administered 2014-03-08: 2 via ORAL
  Filled 2014-03-08: qty 2

## 2014-03-08 MED ORDER — SIMVASTATIN 10 MG PO TABS
10.0000 mg | ORAL_TABLET | Freq: Every day | ORAL | Status: DC
  Administered 2014-03-08 – 2014-03-10 (×3): 10 mg via ORAL
  Filled 2014-03-08 (×4): qty 1

## 2014-03-08 MED ORDER — ACETAMINOPHEN 325 MG PO TABS: 650.0000 mg | ORAL_TABLET | ORAL | Status: DC | PRN

## 2014-03-08 MED ORDER — MORPHINE SULFATE 2 MG/ML IJ SOLN: 0.5000 mg | INTRAMUSCULAR | Status: DC | PRN

## 2014-03-08 MED ORDER — LEVOTHYROXINE SODIUM 25 MCG PO TABS
25.0000 ug | ORAL_TABLET | Freq: Every day | ORAL | Status: DC
  Administered 2014-03-09 – 2014-03-11 (×3): 25 ug via ORAL
  Filled 2014-03-08 (×4): qty 1

## 2014-03-08 MED ORDER — HYDROCODONE-ACETAMINOPHEN 5-325 MG PO TABS
1.0000 | ORAL_TABLET | Freq: Four times a day (QID) | ORAL | Status: DC | PRN
  Administered 2014-03-09 – 2014-03-11 (×7): 1 via ORAL
  Filled 2014-03-08 (×7): qty 1

## 2014-03-08 MED ORDER — AMLODIPINE BESYLATE 10 MG PO TABS
10.0000 mg | ORAL_TABLET | Freq: Every day | ORAL | Status: DC
  Administered 2014-03-08 – 2014-03-11 (×4): 10 mg via ORAL
  Filled 2014-03-08 (×4): qty 1

## 2014-03-08 MED ORDER — LECITHIN 1200 MG PO CAPS: 1200.0000 mg | ORAL_CAPSULE | Freq: Every day | ORAL | Status: DC

## 2014-03-08 MED ORDER — ENOXAPARIN SODIUM 40 MG/0.4ML ~~LOC~~ SOLN
40.0000 mg | SUBCUTANEOUS | Status: DC
  Administered 2014-03-08 – 2014-03-10 (×3): 40 mg via SUBCUTANEOUS
  Filled 2014-03-08 (×4): qty 0.4

## 2014-03-08 NOTE — ED Notes (Signed)
Bed: [id number] Expected date: 03/08/14 Expected time: 7:54 AM Means of arrival: Ambulance Comments: Hip Pain

## 2014-03-08 NOTE — ED Notes (Addendum)
Per EMS, pt fell 03/06/14. A mobile xray was done, but there was not anything found. She fell again yesterday. She woke up this morning and c/o LT hip pain and lower back pain. Pt also had a skin tear on LT forearm that was present upon arrival. [facility] [place] [phone]

## 2014-03-08 NOTE — ED Notes (Signed)
Pt to Xray.

## 2014-03-08 NOTE — ED Notes (Signed)
Pt returned from CT °

## 2014-03-08 NOTE — ED Notes (Signed)
Dr. Gwendolyn GrantWalden informed family patient would be admitted.

## 2014-03-08 NOTE — Progress Notes (Signed)
PHARMACIST - PHYSICIAN ORDER COMMUNICATION  CONCERNING: P&T Medication Policy on Herbal Medications  DESCRIPTION:  This patient's order for:  lecithin  has been noted.  This product(s) is classified as an "herbal" or natural product. Due to a lack of definitive safety studies or FDA approval, nonstandard manufacturing practices, plus the potential risk of unknown drug-drug interactions while on inpatient medications, the Pharmacy and Therapeutics Committee does not permit the use of "herbal" or natural products of this type within [facility].   ACTION TAKEN: The pharmacy department is unable to verify this order at this time and your patient has been informed of this safety policy. Please reevaluate patient's clinical condition at discharge and address if the herbal or natural product(s) should be resumed at that time.   Loma BostonLaura Emmabelle Fear, PharmD Pager: [phone] 03/08/2014 3:54 PM

## 2014-03-08 NOTE — ED Notes (Signed)
Assumed care of patient from C. Margo AyeHall, Charity fundraiserN.  Pt's sats dropping on 2 L [place].  Increased to 4.  Pt's son at bedside.  Pt seems concerned about walking and falling.  Pt has hx of falls.

## 2014-03-08 NOTE — ED Provider Notes (Signed)
CSN: [id number]     Arrival date & time 03/08/14  [id number] History   First MD Initiated Contact with Patient 03/08/14 [phone]     Chief Complaint  Patient presents with  . Post fall    . Back Pain    lower      (Consider location/radiation/quality/duration/timing/severity/associated sxs/prior Treatment) Patient is a 80 y.o. female presenting with back pain. The history is provided by the patient.  Back Pain Location:  Lumbar spine Quality:  Aching Radiates to:  Does not radiate Pain severity:  Moderate Pain is:  Same all the time Onset quality:  Gradual Duration:  2 days Timing:  Constant Progression:  Worsening Chronicity:  New Context: falling (2 days over past 2 days)   Relieved by:  Nothing Worsened by:  Nothing tried Associated symptoms: bladder incontinence and leg pain (R hip)   Associated symptoms: no abdominal pain, no bowel incontinence, no chest pain, no dysuria and no fever     Past Medical History  Diagnosis Date  . HTN (hypertension)   . Other and unspecified hyperlipidemia   . DM (diabetes mellitus)   . Hyperthyroidism   . Hemorrhoids   . Legally blind     secondary to  macular degeneration  . AF (atrial fibrillation)   . Rectal bleeding   . Sinus node dysfunction   . Hip fracture   . Bradycardia   . Aortic stenosis   . Mitral valve prolapse   . Hearing loss   . Memory loss 02/16/2012  . Disturbance of salivary secretion [id number]  . Unspecified hereditary and idiopathic peripheral neuropathy 10/28/202012  . Closed fracture of unspecified trochanteric section of femur 05/29/2011  . Anxiety state, unspecified 08/19/2010  . Diffuse cystic mastopathy 12/23/2009  . Flatulence, eructation, and gas pain 12/03/2009  . Closed fracture of lumbar vertebra without mention of spinal cord injury 1November 26, 202010  . Personal history of fall 01/29/2009  . Occlusion and stenosis of carotid artery without mention of cerebral infarction 12/27/2007  . Syncope and collapse 12/27/2007  .  Other malaise and fatigue 12/06/2007  . Unspecified urinary incontinence 02/22/2005  . Cardiac pacemaker in situ 09/12/2004  . Unspecified hypothyroidism 03/18/2010  . Abnormality of gait 09/08/2003  . Edema 08/18/2003  . Macular degeneration (senile) of retina, unspecified 12/12/2001  . Undiagnosed cardiac murmurs 04/11/2001  . Senile osteoporosis 12/13/2000  . [place] blindness, as defined in [place] 03/20/1999   Past Surgical History  Procedure Laterality Date  . Pacemaker insertion  2006    Medtronic In-Pulse  . Left hip surgery  09/2005  . Right hip surgery  08/2003  . 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 Tonsillectomy    . Breast biopsy Left 1970    benign   Family History  Problem Relation Age of Onset  . Colon cancer Mother   . Cancer Mother     colon  . Heart attack Father   . Heart disease Father     MI  . Heart disease Sister     MI   History  Substance Use Topics  . Smoking status: Never Smoker   . Smokeless tobacco: Never Used  . Alcohol Use: No   OB History   Grav Para Term Preterm Abortions TAB SAB Ect Mult Living                 Review of Systems  Constitutional: Negative for fever.  Respiratory: Negative  for cough and shortness of breath.   Cardiovascular: Negative for chest pain.  Gastrointestinal: Negative for vomiting, abdominal pain and bowel incontinence.  Genitourinary: Positive for bladder incontinence. Negative for dysuria.  Musculoskeletal: Positive for back pain.  All other systems reviewed and are negative.     Allergies  Codeine; Ibuprofen; Lisinopril; Penicillins; and Sanctura  Home Medications   Prior to Admission medications   Medication Sig Start Date End Date Taking? Authorizing Provider  acetaminophen (TYLENOL) 325 MG tablet Take 650 mg by mouth every 4 (four) hours as needed.     Historical Provider, MD  amLODipine (NORVASC) 10 MG tablet Take 10 mg by mouth Daily.  08/25/11   Historical Provider, MD  calcium-vitamin D (OSCAL WITH D) 500-200 MG-UNIT per tablet Take 1 tablet by mouth 2 (two) times daily.    Historical Provider, MD  Lecithin 1200 MG CAPS Take by mouth daily.    Historical Provider, MD  levothyroxine (SYNTHROID, LEVOTHROID) 25 MCG tablet Take 12.5 mg by mouth Daily. 08/22/11   Historical Provider, MD  magnesium hydroxide (MILK OF MAGNESIA) 400 MG/5ML suspension Take 30 mLs by mouth. Every other day for constipation    Historical Provider, MD  metFORMIN (GLUCOPHAGE) 500 MG tablet 2 (two) times daily. 09/24/13   Historical Provider, MD  metoprolol succinate (TOPROL-XL) 50 MG 24 hr tablet Take 50 mg by mouth Daily. 08/12/11   Historical Provider, MD  Multiple Vitamins-Minerals (PRESERVISION AREDS PO) Take by mouth. One capsule twice daily    Historical Provider, MD  oxybutynin (DITROPAN-XL) 5 MG 24 hr tablet Take 5 mg by mouth Daily. 09/05/11   Historical Provider, MD  polyethylene glycol powder (GLYCOLAX/MIRALAX) powder Take 17 g by mouth Daily. 09/05/11   Historical Provider, MD  simvastatin (ZOCOR) 10 MG tablet Take 10 mg by mouth Daily. 08/29/11   Historical Provider, MD   BP 152/58  Pulse 74  Temp(Src) 98.9 F (37.2 C) (Oral)  SpO2 94% Physical Exam  Nursing note and vitals reviewed. Constitutional: She is oriented to person, place, and time. She appears well-developed and well-nourished. No distress.  HENT:  Head: Normocephalic and atraumatic.  Eyes: EOM are normal. Pupils are equal, round, and reactive to light.  Neck: Normal range of motion. Neck supple.  Cardiovascular: Normal rate and regular rhythm.  Exam reveals no friction rub.   No murmur heard. Pulmonary/Chest: Effort normal and breath sounds normal. No respiratory distress. She has no wheezes. She has no rales.  Abdominal: Soft. She exhibits no distension. There is no tenderness. There is no rebound.  Musculoskeletal: Normal range of motion. She exhibits no edema.  Neurological:  She is alert and oriented to person, place, and time. No cranial nerve deficit or sensory deficit. She exhibits normal muscle tone. Coordination normal. GCS eye subscore is 4. GCS verbal subscore is 5. GCS motor subscore is 6.  Reflex Scores:      Patellar reflexes are 2+ on the right side and 2+ on the left side. Skin: She is not diaphoretic.    ED Course  Procedures (including critical care time) Labs Review Labs Reviewed  CBC  BASIC METABOLIC PANEL  URINALYSIS, ROUTINE W REFLEX MICROSCOPIC    Imaging Review Ct Abdomen Pelvis Wo Contrast  03/08/2014   CLINICAL DATA:  Low back pain.  Left hip pain.  EXAM: CT ABDOMEN AND PELVIS WITHOUT CONTRAST  TECHNIQUE: Multidetector CT imaging of the abdomen and pelvis was performed following the standard protocol without IV contrast.  COMPARISON:  CT abdomen and  pelvis 05/25/2011. Lumbar spine radiographs 03/08/2014.  FINDINGS: The lung bases are clear without focal nodule, mass, or airspace disease. Coronary artery calcifications are present. Pacing wires are in place. Heart size is normal. No significant pleural or pericardial effusion is present.  The liver and spleen are within normal limits. A small hiatal hernia is noted. Stomach, duodenum, and pancreas are otherwise unremarkable. The common bile duct and gallbladder are normal. The adrenal glands are normal bilaterally. The kidneys and ureters are within normal limits.  The rectosigmoid colon is within normal limits. The remainder the colon is unremarkable. The appendix is not discretely visualized and may be surgically absent. Small bowel is within normal limits.  Patient is status post right total hip arthroplasty. ORIF for intertrochanteric fracture is noted on the left.  A nondisplaced fracture is noted along the left greater trochanter. This is new since prior exam. This may be a chronic injury.  A remote L5 compression fracture is present. No acute fracture or traumatic subluxation is present within  the thoracolumbar spine.  IMPRESSION: 1. Nondisplaced fracture along the left greater trochanter is new since prior exam. It is unclear if this is acute or chronic injury. 2. Postoperative changes of both hips. 3. Remote L5 compression fracture. 4. Extensive atherosclerotic changes including coronary artery disease. 5. Small hiatal hernia. 6. No acute intra-abdominal lesion to explain the patient's symptoms.   Electronically Signed   By: Gennette Pac M.D.   On: 03/08/2014 12:40   Dg Chest 1 View  03/08/2014   CLINICAL DATA:  Fall.  EXAM: CHEST - 1 VIEW  COMPARISON:  05/25/2011  FINDINGS: Stable appearance of dual-chamber pacemaker. The heart size is stable and within normal limits. Lung volumes are low bilaterally. There is no evidence of pulmonary edema, consolidation, pneumothorax, nodule or pleural fluid.  IMPRESSION: Low lung volumes.  No active disease.   Electronically Signed   By: Irish Lack M.D.   On: 03/08/2014 09:39   Dg Lumbar Spine Complete  03/08/2014   CLINICAL DATA:  Fall on Thursday and yesterday. Back pain. Bilateral hip pain.  EXAM: LUMBAR SPINE - COMPLETE 4+ VIEW  COMPARISON:  03/08/2014  FINDINGS: There is chronic wedge compression fracture of L5, T11, and T10. No acute fracture identified. No suspicious lytic or blastic lesions are identified. There is moderate atherosclerotic calcification of the aorta and its branches. Patient has had ORIF of the left hip and right hip arthroplasty.  IMPRESSION: 1. Chronic wedge compression fractures. 2.  No evidence for acute  abnormality.   Electronically Signed   By: Rosalie Gums M.D.   On: 03/08/2014 09:51   Dg Pelvis 1-2 Views  03/08/2014   CLINICAL DATA:  Fall today. Hip pain. History of bilateral hip surgery and hip fracture.  EXAM: PELVIS - 1-2 VIEW  COMPARISON:  CT of the abdomen and pelvis on 05/25/2011  FINDINGS: Patient has had left hip ORIF and right hip arthroplasty. Bones appear radiolucent. There is no evidence for acute fracture or  subluxation. Visualized bowel gas pattern is nonobstructive. There is atherosclerotic calcification of the femoral arteries.  IMPRESSION: 1. Postoperative changes. 2.  No evidence for acute  abnormality.   Electronically Signed   By: Rosalie Gums M.D.   On: 03/08/2014 09:36   Ct Head Wo Contrast  03/08/2014   CLINICAL DATA:  falls, posterior scalp hematoma.  Recent falls.  EXAM: CT HEAD WITHOUT CONTRAST  TECHNIQUE: Contiguous axial images were obtained from the base of the skull through  the vertex without intravenous contrast.  COMPARISON:  05/24/2011  FINDINGS: There is moderate central and cortical atrophy. Periventricular white matter changes are consistent with small vessel disease. There is no evidence for hemorrhage, mass lesion, or acute infarction.  Right posterior parietal scalp hematoma is noted. There is no underlying calvarial fracture. Small fluid level is identified within the right sphenoid sinus. No sinus wall fracture identified. There is atherosclerotic calcification of the internal carotid arteries.  IMPRESSION: 1. Atrophy and small vessel disease. 2.  No evidence for acute intracranial abnormality. 3. Right posterior parietal scalp hematoma without underlying calvarial fracture. 4. Sinusitis involving the right sphenoid air cell. No evidence for sinus wall fracture   Electronically Signed   By: Rosalie Gums M.D.   On: 03/08/2014 09:25     EKG Interpretation None      MDM   Final diagnoses:  Hip fracture, right, closed, initial encounter  Fall, initial encounter  Memory deficits  Unspecified essential hypertension    26F here s/p 2 falls. Fell 2 days ago and yesterday. Fall 2 days ago - no preceding symptoms. Had hematoma on posterior R scalp and hip pain - portable xrays at her assisted living facility normal. Fell again yesterday - similar to previous, no preceding symptoms. Here with back pain, R hip pain. Son states some urinary incontinence that developed between the 2 falls.  No fevers or vomiting. Not demented, has macular degeneration and difficulty hearing. Here with normal reflexes, normal ROM of hips, but pain with R hip ROM. Son reports a fair amount of coughing. Will check CXR, urine, basic labs, head CT, pelvis xrays. Xrays ok, no fractures. Normal rectal tone, no saddle anesthesia. I spoke with Lovell Sheehan with [facility] who said isolated urinary incontinence is unlikely to have neurologic problem in her spinal cord - likely will have many more other deficits. Cannot MRI due to pacemaker, will not pursue further imaging with normal reflexes, normal strength & sensation. Patient cannot ambulate due to L hip pain. CT shows L greater trochanter fracture - acute vs chronic. With 2 falls and new pain, likely new. Ortho states non-op. Someone from [facility] will see patient. Admitted by Dr. Elisabeth Pigeon. I have reviewed all labs and imaging and considered them in my medical decision making.   Elwin Mocha, MD 03/08/14 [phone]

## 2014-03-08 NOTE — H&P (Signed)
[facility] History and Physical  [name]. [name] ZOX:[id number] DOB: November 07, 1914 DOA: 03/08/2014  Referring physician: ER physician PCP: Kimber Relic, MD   Chief Complaint: fall  HPI:  80 year old female with past medical history of hypertension, dyslipidemia, diabetes, hypothyroidism who presented to [facility] ED 8/82015 status post fall at ALF. SHe fell 2 days ago and then yesterday. Pt reported having back pain and left and right hip pain, 5-6/10 in intensity, nom radiating. Pain was relieved with analgesia given in ED. No prodromal symptoms prior to the fall. No chest pain, shortness of breath, palpitations. No abdominal pain, nausea or vomiting. No reports of blood in stool or urine. No fevers or chills. In ED, vitals are stable. Pelvic and spine x ray did not reveal acute fracture. CXR did not reveal acute cardiopulmonary process. CT scan did however reveal acute fracture in greater left trochanteric area.    Assessment & Plan    Principal Problem:   Hip fracture / Fall  Pt has sustained left greater trochanteric fracture seen on CT scan. No acute fractures identified on pelvic x ray or lumbar spine x ray  Will see if ortho plan for surgery vs non operative management  Continue pain control PRN  PT once pt able to participate  Active Problems:   Unspecified hypothyroidism  continue levothyroxine    DM  Continue metformin    HYPERLIPIDEMIA  Continue statin therapy   HYPERTENSION  Continue Norvasc and metoprolol   Hypertonicity of bladder  Continue ditropan    DVT prophylaxis   Lovenox subQ while pt is hospital   Radiological Exams on Admission:  Ct Abdomen Pelvis Wo Contrast  03/08/2014  1. Nondisplaced fracture along the left greater trochanter is new since prior exam. It is unclear if this is acute or chronic injury. 2. Postoperative changes of both hips. 3. Remote L5 compression fracture. 4. Extensive atherosclerotic changes including coronary artery disease. 5.  Small hiatal hernia. 6. No acute intra-abdominal lesion to explain the patient's symptoms.    Dg Chest 1 View  03/08/2014    Low lung volumes.  No active disease.     Dg Lumbar Spine Complete  03/08/2014 1. Chronic wedge compression fractures. 2.  No evidence for acute  abnormality.     Dg Pelvis 1-2 Views  03/08/2014 1. Postoperative changes. 2.  No evidence for acute  abnormality.     Ct Head Wo Contrast  03/08/2014 1. Atrophy and small vessel disease. 2.  No evidence for acute intracranial abnormality. 3. Right posterior parietal scalp hematoma without underlying calvarial fracture. 4. Sinusitis involving the right sphenoid air cell. No evidence for sinus wall fracture     Code Status: Full Family Communication: Family not at the bedside  Disposition Plan: Admit for further evaluation  Manson Passey, MD  [facility] Pager [phone]  Review of Systems:  Constitutional: Negative for fever, chills and malaise/fatigue. Negative for diaphoresis.  HENT: Negative for hearing loss, ear pain, nosebleeds, congestion, sore throat, neck pain, tinnitus and ear discharge.   Eyes: Negative for blurred vision, double vision, photophobia, pain, discharge and redness.  Respiratory: Negative for cough, hemoptysis, sputum production, shortness of breath, wheezing and stridor.   Cardiovascular: Negative for chest pain, palpitations, orthopnea, claudication and leg swelling.  Gastrointestinal: Negative for nausea, vomiting and abdominal pain. Negative for heartburn, constipation, blood in stool and melena.  Genitourinary: Negative for dysuria, urgency, frequency, hematuria and flank pain.  Musculoskeletal: per HPI Skin: Negative for itching and rash.  Neurological: Negative for dizziness and weakness. Negative for tingling, tremors, sensory change, speech change, focal weakness, loss of consciousness and headaches.  Endo/Heme/Allergies: Negative for environmental allergies and polydipsia. Does not  bruise/bleed easily.  Psychiatric/Behavioral: Negative for suicidal ideas. The patient is not nervous/anxious.      Past Medical History  Diagnosis Date  . HTN (hypertension)   . Other and unspecified hyperlipidemia   . DM (diabetes mellitus)   . Hyperthyroidism   . Hemorrhoids   . Legally blind     secondary to  macular degeneration  . AF (atrial fibrillation)   . Rectal bleeding   . Sinus node dysfunction   . Hip fracture   . Bradycardia   . Aortic stenosis   . Mitral valve prolapse   . Hearing loss   . Memory loss 02/16/2012  . Disturbance of salivary secretion [id number]  . Unspecified hereditary and idiopathic peripheral neuropathy 10/28/202012  . Closed fracture of unspecified trochanteric section of femur 05/29/2011  . Anxiety state, unspecified 08/19/2010  . Diffuse cystic mastopathy 12/23/2009  . Flatulence, eructation, and gas pain 12/03/2009  . Closed fracture of lumbar vertebra without mention of spinal cord injury 12020/09/1608  . Personal history of fall 01/29/2009  . Occlusion and stenosis of carotid artery without mention of cerebral infarction 12/27/2007  . Syncope and collapse 12/27/2007  . Other malaise and fatigue 12/06/2007  . Unspecified urinary incontinence 02/22/2005  . Cardiac pacemaker in situ 09/12/2004  . Unspecified hypothyroidism 03/18/2010  . Abnormality of gait 09/08/2003  . Edema 08/18/2003  . Macular degeneration (senile) of retina, unspecified 12/12/2001  . Undiagnosed cardiac murmurs 04/11/2001  . Senile osteoporosis 12/13/2000  . [place] blindness, [place] in [place] 03/20/1999   Past Surgical History  Procedure Laterality Date  . Pacemaker insertion  2006    [employer]tronic In-Pulse  . Left hip surgery  09/2005  . Right hip surgery  08/2003  . 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 Tonsillectomy    . Breast biopsy Left 1970    benign   Social History:  reports that she has never smoked. She  has never used smokeless tobacco. She reports that she does not drink alcohol or use illicit drugs.  Allergies  Allergen Reactions  . Codeine     unknown  . Ibuprofen     unknown  . Lisinopril     unknown  . Penicillins     unknown  . Sanctura [Trospium Chloride]     unknown    Family History:  Family History  Problem Relation Age of Onset  . Colon cancer Mother   . Cancer Mother     colon  . Heart attack Father   . Heart disease Father     MI  . Heart disease Sister     MI     Prior to Admission medications   Medication Sig Start Date End Date Taking? Authorizing Provider  acetaminophen (TYLENOL) 325 MG tablet Take 650 mg by mouth every 4 (four) hours as needed for mild pain or moderate pain.    Yes Historical Provider, MD  amLODipine (NORVASC) 10 MG tablet Take 10 mg by mouth Daily. 08/25/11  Yes Historical Provider, MD  calcium-vitamin D (OSCAL WITH D) 500-200 MG-UNIT per tablet Take 1 tablet by mouth 2 (two) times daily.   Yes Historical Provider, MD  Lecithin 1200 MG CAPS Take 1,200 mg by mouth daily.  Yes Historical Provider, MD  levothyroxine (SYNTHROID, LEVOTHROID) 25 MCG tablet Take 12.5 mg by mouth Daily. 08/22/11  Yes Historical Provider, MD  metFORMIN (GLUCOPHAGE) 500 MG tablet 2 (two) times daily. 09/24/13  Yes Historical Provider, MD  metoprolol succinate (TOPROL-XL) 50 MG 24 hr tablet Take 50 mg by mouth Daily. 08/12/11  Yes Historical Provider, MD  Multiple Vitamins-Minerals (PRESERVISION AREDS PO) Take 1 tablet by mouth 2 (two) times daily.    Yes Historical Provider, MD  oxybutynin (DITROPAN-XL) 5 MG 24 hr tablet Take 5 mg by mouth Daily. 09/05/11  Yes Historical Provider, MD  simvastatin (ZOCOR) 10 MG tablet Take 10 mg by mouth Daily. 08/29/11  Yes Historical Provider, MD  magnesium hydroxide (MILK OF MAGNESIA) 400 MG/5ML suspension Take 30 mLs by mouth every other day. Every other day as needed for constipation    Historical Provider, MD   Physical  Exam: Filed Vitals:   03/08/14 1128 03/08/14 1141 03/08/14 1247 03/08/14 1332  BP: 146/78 157/54 144/52   Pulse: 63 78 59 61  Temp:  97.9 F (36.6 C)    TempSrc:  Oral    Resp: 17 17 16 18   SpO2: 92% 96% 96% 92%    Physical Exam  Constitutional: Appears well-developed and well-nourished. No distress.  HENT: Normocephalic. No tonsillar erythema or exudates Eyes: Conjunctivae and EOM are normal. PERRLA, no scleral icterus.  Neck: Normal ROM. Neck supple. No JVD. No tracheal deviation. No thyromegaly.  CVS: RRR, S1/S2 +, no murmurs, no gallops, no carotid bruit.  Pulmonary: Effort and breath sounds normal, no stridor, rhonchi, wheezes, rales.  Abdominal: Soft. BS +,  no distension, tenderness, rebound or guarding.  Musculoskeletal: Normal range of motion. No edema and no tenderness.  Lymphadenopathy: No lymphadenopathy noted, cervical, inguinal. Neuro: Alert. Normal reflexes, muscle tone coordination. No focal neurologic deficits. Skin: Skin is warm and dry. No rash noted. Not diaphoretic. No erythema. No pallor.  Psychiatric: Normal mood and affect. Behavior, judgment, thought content normal.   Labs on Admission:  Basic Metabolic Panel:  Recent Labs Lab 03/08/14 0849  NA 138  K 3.9  CL 100  CO2 27  GLUCOSE 152*  BUN 17  CREATININE 0.84  CALCIUM 9.8   Liver Function Tests: No results found for this basename: AST, ALT, ALKPHOS, BILITOT, PROT, ALBUMIN,  in the last 168 hours No results found for this basename: LIPASE, AMYLASE,  in the last 168 hours No results found for this basename: AMMONIA,  in the last 168 hours CBC:  Recent Labs Lab 03/08/14 0849  WBC 11.8*  HGB 13.6  HCT 39.6  MCV 86.1  PLT 206   Cardiac Enzymes: No results found for this basename: CKTOTAL, CKMB, CKMBINDEX, TROPONINI,  in the last 168 hours BNP: No components found with this basename: POCBNP,  CBG: No results found for this basename: GLUCAP,  in the last 168 hours  If 7PM-7AM, please  contact night-coverage [email or web address] Password [email or web address] 03/08/2014, 1:33 PM

## 2014-03-08 NOTE — Consult Note (Signed)
ORTHOPAEDIC CONSULTATION  REQUESTING PHYSICIAN: Robbie Lis, MD  Chief Complaint: Left inf rami fx.   HPI: [name] is a 80 y.o. female who complains of  S/p mechanical fall  Past Medical History  Diagnosis Date  . HTN (hypertension)   . Other and unspecified hyperlipidemia   . DM (diabetes mellitus)   . Hyperthyroidism   . Hemorrhoids   . Legally blind     secondary to  macular degeneration  . AF (atrial fibrillation)   . Rectal bleeding   . Sinus node dysfunction   . Hip fracture   . Bradycardia   . Aortic stenosis   . Mitral valve prolapse   . Hearing loss   . Memory loss 02/16/2012  . Disturbance of salivary secretion [id number]  . Unspecified hereditary and idiopathic peripheral neuropathy 10/28/202012  . Closed fracture of unspecified trochanteric section of femur 05/29/2011  . Anxiety state, unspecified 08/19/2010  . Diffuse cystic mastopathy 12/23/2009  . Flatulence, eructation, and gas pain 12/03/2009  . Closed fracture of lumbar vertebra without mention of spinal cord injury [id number]  . Personal history of fall 01/29/2009  . Occlusion and stenosis of carotid artery without mention of cerebral infarction 12/27/2007  . Syncope and collapse 12/27/2007  . Other malaise and fatigue 12/06/2007  . Unspecified urinary incontinence 02/22/2005  . Cardiac pacemaker in situ 09/12/2004  . Unspecified hypothyroidism 03/18/2010  . Abnormality of gait 09/08/2003  . Edema 08/18/2003  . Macular degeneration (senile) of retina, unspecified 12/12/2001  . Undiagnosed cardiac murmurs 04/11/2001  . Senile osteoporosis 12/13/2000  . Legal blindness, as defined in [place] 03/20/1999   Past Surgical History  Procedure Laterality Date  . Pacemaker insertion  2006    Medtronic In-Pulse  . Left hip surgery  09/2005  . Right hip surgery  08/2003  . 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 Tonsillectomy    . Breast biopsy  Left 1970    benign   History   Social History  . Marital Status: Widowed    Spouse Name: N/A    Number of Children: N/A  . Years of Education: N/A   Social History Main Topics  . Smoking status: Never Smoker   . Smokeless tobacco: Never Used  . Alcohol Use: No  . Drug Use: No  . Sexual Activity: No   Other Topics Concern  . None   Social History Narrative   Lives at [facility] since 2010 AL   Widowed   Never smoked   Alcohol none   Exercise none   Walks with walker   DNR/ Living Will   Family History  Problem Relation Age of Onset  . Colon cancer Mother   . Cancer Mother     colon  . Heart attack Father   . Heart disease Father     MI  . Heart disease Sister     MI   Allergies  Allergen Reactions  . Codeine     unknown  . Ibuprofen     unknown  . Lisinopril     unknown  . Penicillins     unknown  . Sanctura [Trospium Chloride]     unknown   Prior to Admission medications   Medication Sig Start Date End Date Taking? Authorizing Provider  acetaminophen (TYLENOL) 325 MG tablet Take 650 mg by mouth every 4 (four) hours as needed for mild pain  or moderate pain.    Yes Historical Provider, MD  amLODipine (NORVASC) 10 MG tablet Take 10 mg by mouth Daily. 08/25/11  Yes Historical Provider, MD  calcium-vitamin D (OSCAL WITH D) 500-200 MG-UNIT per tablet Take 1 tablet by mouth 2 (two) times daily.   Yes Historical Provider, MD  Lecithin 1200 MG CAPS Take 1,200 mg by mouth daily.    Yes Historical Provider, MD  levothyroxine (SYNTHROID, LEVOTHROID) 25 MCG tablet Take 12.5 mg by mouth Daily. 08/22/11  Yes Historical Provider, MD  metFORMIN (GLUCOPHAGE) 500 MG tablet 2 (two) times daily. 09/24/13  Yes Historical Provider, MD  metoprolol succinate (TOPROL-XL) 50 MG 24 hr tablet Take 50 mg by mouth Daily. 08/12/11  Yes Historical Provider, MD  Multiple Vitamins-Minerals (PRESERVISION AREDS PO) Take 1 tablet by mouth 2 (two) times daily.    Yes Historical  Provider, MD  oxybutynin (DITROPAN-XL) 5 MG 24 hr tablet Take 5 mg by mouth Daily. 09/05/11  Yes Historical Provider, MD  simvastatin (ZOCOR) 10 MG tablet Take 10 mg by mouth Daily. 08/29/11  Yes Historical Provider, MD  magnesium hydroxide (MILK OF MAGNESIA) 400 MG/5ML suspension Take 30 mLs by mouth every other day. Every other day as needed for constipation    Historical Provider, MD   Ct Abdomen Pelvis Wo Contrast  03/08/2014   CLINICAL DATA:  Low back pain.  Left hip pain.  EXAM: CT ABDOMEN AND PELVIS WITHOUT CONTRAST  TECHNIQUE: Multidetector CT imaging of the abdomen and pelvis was performed following the standard protocol without IV contrast.  COMPARISON:  CT abdomen and pelvis 05/25/2011. Lumbar spine radiographs 03/08/2014.  FINDINGS: The lung bases are clear without focal nodule, mass, or airspace disease. Coronary artery calcifications are present. Pacing wires are in place. Heart size is normal. No significant pleural or pericardial effusion is present.  The liver and spleen are within normal limits. A small hiatal hernia is noted. Stomach, duodenum, and pancreas are otherwise unremarkable. The common bile duct and gallbladder are normal. The adrenal glands are normal bilaterally. The kidneys and ureters are within normal limits.  The rectosigmoid colon is within normal limits. The remainder the colon is unremarkable. The appendix is not discretely visualized and may be surgically absent. Small bowel is within normal limits.  Patient is status post right total hip arthroplasty. ORIF for intertrochanteric fracture is noted on the left.  A nondisplaced fracture is noted along the left greater trochanter. This is new since prior exam. This may be a chronic injury.  A remote L5 compression fracture is present. No acute fracture or traumatic subluxation is present within the thoracolumbar spine.  IMPRESSION: 1. Nondisplaced fracture along the left greater trochanter is new since prior exam. It is unclear  if this is acute or chronic injury. 2. Postoperative changes of both hips. 3. Remote L5 compression fracture. 4. Extensive atherosclerotic changes including coronary artery disease. 5. Small hiatal hernia. 6. No acute intra-abdominal lesion to explain the patient's symptoms.   Electronically Signed   By: Lawrence Santiago M.D.   On: 03/08/2014 12:40   Dg Chest 1 View  03/08/2014   CLINICAL DATA:  Fall.  EXAM: CHEST - 1 VIEW  COMPARISON:  05/25/2011  FINDINGS: Stable appearance of dual-chamber pacemaker. The heart size is stable and within normal limits. Lung volumes are low bilaterally. There is no evidence of pulmonary edema, consolidation, pneumothorax, nodule or pleural fluid.  IMPRESSION: Low lung volumes.  No active disease.   Electronically Signed   By: Eulas Post  Kathlene Cote M.D.   On: 03/08/2014 09:39   Dg Lumbar Spine Complete  03/08/2014   CLINICAL DATA:  Fall on Thursday and yesterday. Back pain. Bilateral hip pain.  EXAM: LUMBAR SPINE - COMPLETE 4+ VIEW  COMPARISON:  03/08/2014  FINDINGS: There is chronic wedge compression fracture of L5, T11, and T10. No acute fracture identified. No suspicious lytic or blastic lesions are identified. There is moderate atherosclerotic calcification of the aorta and its branches. Patient has had ORIF of the left hip and right hip arthroplasty.  IMPRESSION: 1. Chronic wedge compression fractures. 2.  No evidence for acute  abnormality.   Electronically Signed   By: Shon Hale M.D.   On: 03/08/2014 09:51   Dg Pelvis 1-2 Views  03/08/2014   CLINICAL DATA:  Fall today. Hip pain. History of bilateral hip surgery and hip fracture.  EXAM: PELVIS - 1-2 VIEW  COMPARISON:  CT of the abdomen and pelvis on 05/25/2011  FINDINGS: Patient has had left hip ORIF and right hip arthroplasty. Bones appear radiolucent. There is no evidence for acute fracture or subluxation. Visualized bowel gas pattern is nonobstructive. There is atherosclerotic calcification of the femoral arteries.   IMPRESSION: 1. Postoperative changes. 2.  No evidence for acute  abnormality.   Electronically Signed   By: Shon Hale M.D.   On: 03/08/2014 09:36   Ct Head Wo Contrast  03/08/2014   CLINICAL DATA:  falls, posterior scalp hematoma.  Recent falls.  EXAM: CT HEAD WITHOUT CONTRAST  TECHNIQUE: Contiguous axial images were obtained from the base of the skull through the vertex without intravenous contrast.  COMPARISON:  05/24/2011  FINDINGS: There is moderate central and cortical atrophy. Periventricular white matter changes are consistent with small vessel disease. There is no evidence for hemorrhage, mass lesion, or acute infarction.  Right posterior parietal scalp hematoma is noted. There is no underlying calvarial fracture. Small fluid level is identified within the right sphenoid sinus. No sinus wall fracture identified. There is atherosclerotic calcification of the internal carotid arteries.  IMPRESSION: 1. Atrophy and small vessel disease. 2.  No evidence for acute intracranial abnormality. 3. Right posterior parietal scalp hematoma without underlying calvarial fracture. 4. Sinusitis involving the right sphenoid air cell. No evidence for sinus wall fracture   Electronically Signed   By: Shon Hale M.D.   On: 03/08/2014 09:25    Positive ROS: All other systems have been reviewed and were otherwise negative with the exception of those mentioned in the HPI and as above.  Labs cbc  Recent Labs  03/08/14 0849  WBC 11.8*  HGB 13.6  HCT 39.6  PLT 206    Labs inflam No results found for this basename: ESR, CRP,  in the last 72 hours  Labs coag No results found for this basename: INR, PT, PTT,  in the last 72 hours   Recent Labs  03/08/14 0849  NA 138  K 3.9  CL 100  CO2 27  GLUCOSE 152*  BUN 17  CREATININE 0.84  CALCIUM 9.8    Physical Exam: Filed Vitals:   03/08/14 1612  BP: 144/54  Pulse: 59  Temp: 98 F (36.7 C)  Resp: 17   General: Alert, no acute  distress Cardiovascular: No pedal edema Respiratory: No cyanosis, no use of accessory musculature GI: No organomegaly, abdomen is soft and non-tender Skin: No lesions in the area of chief complaint Neurologic: Sensation intact distally Lymphatic: No axillary or cervical lymphadenopathy  MUSCULOSKELETAL:  LLE: pain with large  arc motion, distally SILT, +TA/GS/EHL, 2+DP Other extremities are atraumatic with painless ROM and NVI.  Assessment: Left inf rami fx  Plan: nono-op Weight Bearing Status: WBAT Mobilize with PT   I will give my number to her son and they may set up a f/u in 2-4wks but this is not vital as long as she is able to mobilize  I will sign off at this time.    Edmonia Lynch, D, MD Cell [phone]   03/08/2014 4:59 PM

## 2014-03-08 NOTE — ED Notes (Signed)
Pt attempted to ambulate with walker with A. Maple HudsonMoser and Rosana HoesW. Wright, NTs.  Pt c/o left hip pain.  Dr. Gwendolyn GrantWalden informed.

## 2014-03-08 NOTE — Progress Notes (Signed)
Brief Pharmacy note: Lovenox for VTE prophylaxis  Renal function > 30 ml/min, Lovenox 40mg  daily for VTE prophylaxis  Thank you,  Otho BellowsGreen, Garnie Borchardt L PharmD Pager [phone] 03/08/2014, 3:39 PM

## 2014-03-09 DIAGNOSIS — F03918 Unspecified dementia, unspecified severity, with other behavioral disturbance: Secondary | ICD-10-CM

## 2014-03-09 DIAGNOSIS — F0391 Unspecified dementia with behavioral disturbance: Secondary | ICD-10-CM

## 2014-03-09 DIAGNOSIS — E119 Type 2 diabetes mellitus without complications: Secondary | ICD-10-CM

## 2014-03-09 DIAGNOSIS — F0390 Unspecified dementia without behavioral disturbance: Secondary | ICD-10-CM | POA: Diagnosis present

## 2014-03-09 DIAGNOSIS — E039 Hypothyroidism, unspecified: Secondary | ICD-10-CM

## 2014-03-09 LAB — BASIC METABOLIC PANEL
Anion gap: 14 (ref 5–15)
BUN: 19 mg/dL (ref 6–23)
CO2: 24 mEq/L (ref 19–32)
Calcium: 9.1 mg/dL (ref 8.4–10.5)
Chloride: 103 mEq/L (ref 96–112)
Creatinine, Ser: 0.75 mg/dL (ref 0.50–1.10)
GFR calc non Af Amer: 69 mL/min — ABNORMAL LOW (ref >90)
GFR, EST AFRICAN AMERICAN: 80 mL/min — AB (ref >90)
Glucose, Bld: 110 mg/dL — ABNORMAL HIGH (ref 70–99)
POTASSIUM: 3.9 meq/L (ref 3.7–5.3)
Sodium: 141 mEq/L (ref 137–147)

## 2014-03-09 LAB — CBC
HEMATOCRIT: 37.6 % (ref 36.0–46.0)
HEMOGLOBIN: 12.9 g/dL (ref 12.0–15.0)
MCH: 29.9 pg (ref 26.0–34.0)
MCHC: 34.3 g/dL (ref 30.0–36.0)
MCV: 87.2 fL (ref 78.0–100.0)
Platelets: 207 10*3/uL (ref 150–400)
RBC: 4.31 MIL/uL (ref 3.87–5.11)
RDW: 12.6 % (ref 11.5–15.5)
WBC: 10.7 10*3/uL — ABNORMAL HIGH (ref 4.0–10.5)

## 2014-03-09 LAB — VITAMIN B12: VITAMIN B 12: 431 pg/mL (ref 211–911)

## 2014-03-09 NOTE — Clinical Social Work Psychosocial (Signed)
Clinical Social Work Department BRIEF PSYCHOSOCIAL ASSESSMENT 03/09/2014  Patient:  [name], [name].     Account Number:  [id number]     Admit date:  03/08/2014  Clinical Social Worker:  Daiva Huge  Date/Time:  03/09/2014 11:23 AM  Referred by:  Physician  Date Referred:  03/08/2014 Referred for  SNF Placement   Other Referral:   Interview type:  Other - See comment Other interview type:   CSW met with patient and son/POA at bedside    PSYCHOSOCIAL DATA Living Status:  FACILITY Admitted from facility:  [facility] Level of care:  Assisted Living Primary support name:  Hulan Fess [phone] Primary support relationship to patient:  FAMILY Degree of support available:   good    CURRENT CONCERNS Current Concerns  Post-Acute Placement   Other Concerns:    SOCIAL WORK ASSESSMENT / PLAN CSW met with patient and her son at bedside to discuss possible discharge needs. Per son, patient has been residing at [facility] for [facility] years- the last 3 in the ALF unit. Patient has had multiple falls and fractures  (this is her 4th hip fracture per son)  We have discussed the possible need for SNF at d/c- awaiting PT/OT recommendations at this time. Either way, we anticipate her return to [facility] (ALF-vs- SNF).  Both patient and son are in agreement with these plans-   Assessment/plan status:  Other - See comment Other assessment/ plan:   FL2 and PASARR for placement at d/c   Information/referral to community resources:   SNF    PATIENT'S/FAMILY'S RESPONSE TO PLAN OF CARE: Son reports patient is much better than she was on yesterday- they are both pleased with care received in ED as well as on the unit- Support and encouragement offered to patient-        Eduard Clos, MSW, [place] Weekend coverage  [phone]

## 2014-03-09 NOTE — Progress Notes (Signed)
[facility] PROGRESS NOTE  [name]. [name] ZOX:[id number] DOB: Jan 05, 1915 DOA: 03/08/2014 PCP: Kimber Relic, MD  Assessment/Plan:  Principal Problem:   Hip fracture, likely nonoperative management. D/w Dr. Wandra Feinstein. To see patient today for recs regarding activity, expected clinical course. Continue dvt prophylaxis. Long discussion with son. Active Problems:   Unspecified hypothyroidism: TSH ok in June   DM   HYPERLIPIDEMIA   HYPERTENSION   Pacemaker-dual  medtronic   Fall   Hypertonicity of bladder   Macular degeneration of both eyes   Dementia. CT brain ok. Son concerned about periods of agitation and confusion. Will also check B12, folate.   Code Status:  full Family Communication:  Son at bedside Disposition Plan:  SNF  Consultants:  ortho  Procedures:     Antibiotics:    HPI/Subjective: Pain only with movement. Son reports severe agitation yesterday. Better today  Objective: Filed Vitals:   03/09/14 0419  BP: 132/65  Pulse: 58  Temp: 98.4 F (36.9 C)  Resp: 18    Intake/Output Summary (Last 24 hours) at 03/09/14 1339 Last data filed at 03/09/14 1208  Gross per 24 hour  Intake    480 ml  Output    900 ml  Net   -420 ml   There were no vitals filed for this visit.  Exam:   General:  Calm, cooperative, forgetful  Cardiovascular: RRR without MGR  Respiratory: CTA without WRR  Abdomen: S, NT, ND  Ext: no CCE  Basic Metabolic Panel:  Recent Labs Lab 03/08/14 0849 03/09/14 0425  NA 138 141  K 3.9 3.9  CL 100 103  CO2 27 24  GLUCOSE 152* 110*  BUN 17 19  CREATININE 0.84 0.75  CALCIUM 9.8 9.1   Liver Function Tests: No results found for this basename: AST, ALT, ALKPHOS, BILITOT, PROT, ALBUMIN,  in the last 168 hours No results found for this basename: LIPASE, AMYLASE,  in the last 168 hours No results found for this basename: AMMONIA,  in the last 168 hours CBC:  Recent Labs Lab 03/08/14 0849 03/09/14 0425  WBC  11.8* 10.7*  HGB 13.6 12.9  HCT 39.6 37.6  MCV 86.1 87.2  PLT 206 207   Cardiac Enzymes: No results found for this basename: CKTOTAL, CKMB, CKMBINDEX, TROPONINI,  in the last 168 hours BNP (last 3 results) No results found for this basename: PROBNP,  in the last 8760 hours CBG: No results found for this basename: GLUCAP,  in the last 168 hours  No results found for this or any previous visit (from the past 240 hour(s)).   Studies: Ct Abdomen Pelvis Wo Contrast  03/08/2014   CLINICAL DATA:  Low back pain.  Left hip pain.  EXAM: CT ABDOMEN AND PELVIS WITHOUT CONTRAST  TECHNIQUE: Multidetector CT imaging of the abdomen and pelvis was performed following the standard protocol without IV contrast.  COMPARISON:  CT abdomen and pelvis 05/25/2011. Lumbar spine radiographs 03/08/2014.  FINDINGS: The lung bases are clear without focal nodule, mass, or airspace disease. Coronary artery calcifications are present. Pacing wires are in place. Heart size is normal. No significant pleural or pericardial effusion is present.  The liver and spleen are within normal limits. A small hiatal hernia is noted. Stomach, duodenum, and pancreas are otherwise unremarkable. The common bile duct and gallbladder are normal. The adrenal glands are normal bilaterally. The kidneys and ureters are within normal limits.  The rectosigmoid colon is within normal limits. The remainder the colon is unremarkable.  The appendix is not discretely visualized and may be surgically absent. Small bowel is within normal limits.  Patient is status post right total hip arthroplasty. ORIF for intertrochanteric fracture is noted on the left.  A nondisplaced fracture is noted along the left greater trochanter. This is new since prior exam. This may be a chronic injury.  A remote L5 compression fracture is present. No acute fracture or traumatic subluxation is present within the thoracolumbar spine.  IMPRESSION: 1. Nondisplaced fracture along the left  greater trochanter is new since prior exam. It is unclear if this is acute or chronic injury. 2. Postoperative changes of both hips. 3. Remote L5 compression fracture. 4. Extensive atherosclerotic changes including coronary artery disease. 5. Small hiatal hernia. 6. No acute intra-abdominal lesion to explain the patient's symptoms.   Electronically Signed   By: Gennette Pachris  Mattern M.D.   On: 03/08/2014 12:40   Dg Chest 1 View  03/08/2014   CLINICAL DATA:  Fall.  EXAM: CHEST - 1 VIEW  COMPARISON:  05/25/2011  FINDINGS: Stable appearance of dual-chamber pacemaker. The heart size is stable and within normal limits. Lung volumes are low bilaterally. There is no evidence of pulmonary edema, consolidation, pneumothorax, nodule or pleural fluid.  IMPRESSION: Low lung volumes.  No active disease.   Electronically Signed   By: Irish LackGlenn  Yamagata M.D.   On: 03/08/2014 09:39   Dg Lumbar Spine Complete  03/08/2014   CLINICAL DATA:  Fall on Thursday and yesterday. Back pain. Bilateral hip pain.  EXAM: LUMBAR SPINE - COMPLETE 4+ VIEW  COMPARISON:  03/08/2014  FINDINGS: There is chronic wedge compression fracture of L5, T11, and T10. No acute fracture identified. No suspicious lytic or blastic lesions are identified. There is moderate atherosclerotic calcification of the aorta and its branches. Patient has had ORIF of the left hip and right hip arthroplasty.  IMPRESSION: 1. Chronic wedge compression fractures. 2.  No evidence for acute  abnormality.   Electronically Signed   By: Rosalie GumsBeth  Brown M.D.   On: 03/08/2014 09:51   Dg Pelvis 1-2 Views  03/08/2014   CLINICAL DATA:  Fall today. Hip pain. History of bilateral hip surgery and hip fracture.  EXAM: PELVIS - 1-2 VIEW  COMPARISON:  CT of the abdomen and pelvis on 05/25/2011  FINDINGS: Patient has had left hip ORIF and right hip arthroplasty. Bones appear radiolucent. There is no evidence for acute fracture or subluxation. Visualized bowel gas pattern is nonobstructive. There is  atherosclerotic calcification of the femoral arteries.  IMPRESSION: 1. Postoperative changes. 2.  No evidence for acute  abnormality.   Electronically Signed   By: Rosalie GumsBeth  Brown M.D.   On: 03/08/2014 09:36   Ct Head Wo Contrast  03/08/2014   CLINICAL DATA:  falls, posterior scalp hematoma.  Recent falls.  EXAM: CT HEAD WITHOUT CONTRAST  TECHNIQUE: Contiguous axial images were obtained from the base of the skull through the vertex without intravenous contrast.  COMPARISON:  05/24/2011  FINDINGS: There is moderate central and cortical atrophy. Periventricular white matter changes are consistent with small vessel disease. There is no evidence for hemorrhage, mass lesion, or acute infarction.  Right posterior parietal scalp hematoma is noted. There is no underlying calvarial fracture. Small fluid level is identified within the right sphenoid sinus. No sinus wall fracture identified. There is atherosclerotic calcification of the internal carotid arteries.  IMPRESSION: 1. Atrophy and small vessel disease. 2.  No evidence for acute intracranial abnormality. 3. Right posterior parietal scalp hematoma without  underlying calvarial fracture. 4. Sinusitis involving the right sphenoid air cell. No evidence for sinus wall fracture   Electronically Signed   By: Rosalie Gums M.D.   On: 03/08/2014 09:25    Scheduled Meds: . amLODipine  10 mg Oral Daily  . calcium-vitamin D  1 tablet Oral BID  . enoxaparin (LOVENOX) injection  40 mg Subcutaneous Q24H  . levothyroxine  25 mcg Oral QAC breakfast  . magnesium hydroxide  30 mL Oral QODAY  . metFORMIN  850 mg Oral BID WC  . metoprolol succinate  50 mg Oral Daily  . oxybutynin  5 mg Oral QHS  . simvastatin  10 mg Oral q1800   Continuous Infusions:   Time spent: 35 minutes  Charlesa Ehle L  [facility] Pager [phone]. If 7PM-7AM, please contact night-coverage at [email or web address], password [facility] 03/09/2014, 1:39 PM  LOS: 1 day

## 2014-03-09 NOTE — Progress Notes (Signed)
Discussed plan of care with son and daughter in law at bedside re: bed alarm and sitter if pt becomes agitated. Realized pt is a DNR. Will call NP on call for order update.

## 2014-03-09 NOTE — Progress Notes (Signed)
Brief Pharmacy Note: Lovenox protocol for VTE prophylaxis  Renal function stable, patient not obese   Lovenox 40mg  daily appropriate, will adjust if needed  Discontinued Protocol, thank you  Otho BellowsGreen, Madison Albea L PharmD Pager [phone] 03/09/2014, 8:55 AM

## 2014-03-10 DIAGNOSIS — S72112A Displaced fracture of greater trochanter of left femur, initial encounter for closed fracture: Secondary | ICD-10-CM

## 2014-03-10 DIAGNOSIS — S72109A Unspecified trochanteric fracture of unspecified femur, initial encounter for closed fracture: Principal | ICD-10-CM

## 2014-03-10 LAB — FOLATE RBC: RBC Folate: 622 ng/mL — ABNORMAL HIGH (ref >280)

## 2014-03-10 MED ORDER — SENNOSIDES-DOCUSATE SODIUM 8.6-50 MG PO TABS
1.0000 | ORAL_TABLET | Freq: Two times a day (BID) | ORAL | Status: DC
  Administered 2014-03-10 – 2014-03-11 (×3): 1 via ORAL
  Filled 2014-03-10 (×4): qty 1

## 2014-03-10 NOTE — Progress Notes (Signed)
CSW assisting with d/c planning. [facility] once pt is stable for d/c and [employer] has provided authorization. SNF is working with [employer] . A decision is pending. Family is aware that  [facility] does not have a SNF opening at this time and are in agreement with plan to d/c [facility]. Family contact is Alonza BogusPam Cook [phone]( [phone] ). CSW will continue to follow to assist with d/c planning to SNF.  Cori RazorJamie Kamdon Reisig LCSW [phone]

## 2014-03-10 NOTE — Progress Notes (Signed)
  [facility] PROGRESS NOTE  [name] AOZ:[id number]:[id number] DOB: [birth year] DOA: 03/08/2014 PCP: Kimber RelicGREEN, ARTHUR G, MD  Assessment/Plan:  Principal Problem:   Greater trochanter fracture, left: nonoperative management. Continue PT/OT, pain control, bowel regimen, WBAT. Long discussion with son 8/9. Appreciate Dr. Eulah PontMurphy. To SNF when disposition arranged. Active Problems:   Unspecified hypothyroidism: TSH ok in June   DM   HYPERLIPIDEMIA   HYPERTENSION   Pacemaker-dual  medtronic   Fall   Hypertonicity of bladder   Macular degeneration of both eyes   Dementia. CT brain ok. Son concerned about periods of agitation and confusion. B12 normal. Folate pending   Code Status:  full Family Communication:  Son at bedside 8/9 Disposition Plan:  SNF  Consultants:  ortho  Procedures:     Antibiotics:    HPI/Subjective: Pain with movement.  No reported problems  Objective: Filed Vitals:   03/10/14 1102  BP:   Pulse:   Temp:   Resp: 18    Intake/Output Summary (Last 24 hours) at 03/10/14 1327 Last data filed at 03/10/14 1233  Gross per 24 hour  Intake    360 ml  Output      0 ml  Net    360 ml   There were no vitals filed for this visit.  Exam:   General:  Calm, cooperative, forgetful. In chair  Cardiovascular: RRR without MGR  Respiratory: CTA without WRR  Abdomen: S, NT, ND  Ext: no CCE  Basic Metabolic Panel:  Recent Labs Lab 03/08/14 0849 03/09/14 0425  NA 138 141  K 3.9 3.9  CL 100 103  CO2 27 24  GLUCOSE 152* 110*  BUN 17 19  CREATININE 0.84 0.75  CALCIUM 9.8 9.1   Liver Function Tests: No results found for this basename: AST, ALT, ALKPHOS, BILITOT, PROT, ALBUMIN,  in the last 168 hours No results found for this basename: LIPASE, AMYLASE,  in the last 168 hours No results found for this basename: AMMONIA,  in the last 168 hours CBC:  Recent Labs Lab 03/08/14 0849 03/09/14 0425  WBC 11.8* 10.7*  HGB 13.6 12.9  HCT 39.6 37.6   MCV 86.1 87.2  PLT 206 207   Cardiac Enzymes: No results found for this basename: CKTOTAL, CKMB, CKMBINDEX, TROPONINI,  in the last 168 hours BNP (last 3 results) No results found for this basename: PROBNP,  in the last 8760 hours CBG: No results found for this basename: GLUCAP,  in the last 168 hours  No results found for this or any previous visit (from the past 240 hour(s)).   Studies: No results found.  Scheduled Meds: . amLODipine  10 mg Oral Daily  . calcium-vitamin D  1 tablet Oral BID  . enoxaparin (LOVENOX) injection  40 mg Subcutaneous Q24H  . levothyroxine  25 mcg Oral QAC breakfast  . magnesium hydroxide  30 mL Oral QODAY  . metFORMIN  850 mg Oral BID WC  . metoprolol succinate  50 mg Oral Daily  . oxybutynin  5 mg Oral QHS  . senna-docusate  1 tablet Oral BID  . simvastatin  10 mg Oral q1800   Continuous Infusions:   Time spent: 15 minutes  [name]  [facility] Pager [phone]. If 7PM-7AM, please contact night-coverage at [email or web address], password [email or web address] 03/10/2014, 1:27 PM  LOS: 2 days

## 2014-03-10 NOTE — Progress Notes (Addendum)
INITIAL NUTRITION ASSESSMENT  DOCUMENTATION CODES Per approved criteria  -Not Applicable   INTERVENTION: - Recommend nurse tech assist pt with all meals - Pt not interested in nutritional supplements at this time, encouraged increased meal intake - RD to continue to monitor   NUTRITION DIAGNOSIS: Inadequate oral intake related to difficulty self feeding as evidenced by nurse tech report.   Goal: Pt to consume >90% of meals  Monitor:  Weights, labs, intake  Reason for Assessment: Consult for assessment, low braden  80 y.o. female  Admitting Dx: Hip fracture  ASSESSMENT: Pt with past medical history of hypertension, dyslipidemia, diabetes, hypothyroidism [facility] status post fall at ALF. Pt has sustained left greater trochanteric fracture seen on CT scan.  - Pt alone in room - States she has a good appetite, eats 3 meals/day with no changes in weight - Pt legally blind and nurse tech reports most of pt's breakfast ended up on pt this morning however nurse tech said she will assist pt with lunch - Pt not on any nutritional supplements PTA and is not interested in being on any - Weight down 13 pounds in the past 5 months  - Unable to perform full nutrition focused physical exam r/t pt having a hard time following directions   Nutrition Focused Physical Exam:  Subcutaneous Fat:  Orbital Region: wnl Upper Arm Region: mild wasting Thoracic and Lumbar Region: n/a  Muscle:  Temple Region: wnl Clavicle Bone Region: wnl Clavicle and Acromion Bone Region: wnl Scapular Bone Region: n/a Dorsal Hand: wnl Patellar Region: n/a Anterior Thigh Region: n/a Posterior Calf Region: n/a  Edema: None noted    Height: Ht Readings from Last 1 Encounters:  09/25/13 5\' 3"  (1.6 m)    Weight: 03/10/14         119 lb 14.9 oz (54.5 kg)  Ideal Body Weight: 115 lbs   % Ideal Body Weight: 103%  Wt Readings from Last 10 Encounters:  10/10/13 132 lb (59.875 kg)   09/07/11 119 lb (53.978 kg)  09/27/10 136 lb (61.689 kg)  08/06/10 145 lb (65.772 kg)  07/22/10 139 lb 4 oz (63.163 kg)  07/22/10 137 lb (62.143 kg)  07/14/09 137 lb (62.143 kg)  01/22/08 133 lb 2.1 oz (60.388 kg)  12/21/07 134 lb 2.1 oz (60.842 kg)    Usual Body Weight: 132 lbs in March 2015  % Usual Body Weight: 90%  BMI:  21.2 kg/(m^2)  Estimated Nutritional Needs: Kcal: 1350-1550 Protein: 60-75g Fluid: per MD  Skin: Lower left arm laceration   Diet Order: General  EDUCATION NEEDS: -No education needs identified at this time   Intake/Output Summary (Last 24 hours) at 03/10/14 0956 Last data filed at 03/09/14 2120  Gross per 24 hour  Intake    240 ml  Output    300 ml  Net    -60 ml    Last BM: PTA  Labs:   Recent Labs Lab 03/08/14 0849 03/09/14 0425  NA 138 141  K 3.9 3.9  CL 100 103  CO2 27 24  BUN 17 19  CREATININE 0.84 0.75  CALCIUM 9.8 9.1  GLUCOSE 152* 110*    CBG (last 3)  No results found for this basename: GLUCAP,  in the last 72 hours  Scheduled Meds: . amLODipine  10 mg Oral Daily  . calcium-vitamin D  1 tablet Oral BID  . enoxaparin (LOVENOX) injection  40 mg Subcutaneous Q24H  . levothyroxine  25 mcg Oral  QAC breakfast  . magnesium hydroxide  30 mL Oral QODAY  . metFORMIN  850 mg Oral BID WC  . metoprolol succinate  50 mg Oral Daily  . oxybutynin  5 mg Oral QHS  . simvastatin  10 mg Oral q1800    Continuous Infusions:   Past Medical History  Diagnosis Date  . HTN (hypertension)   . Other and unspecified hyperlipidemia   . DM (diabetes mellitus)   . Hyperthyroidism   . Hemorrhoids   . Legally blind     secondary to  macular degeneration  . AF (atrial fibrillation)   . Rectal bleeding   . Sinus node dysfunction   . Hip fracture   . Bradycardia   . Aortic stenosis   . Mitral valve prolapse   . Hearing loss   . Memory loss 02/16/2012  . Disturbance of salivary secretion [id number]  . Unspecified hereditary and  idiopathic peripheral neuropathy 10/28/202012  . Closed fracture of unspecified trochanteric section of femur 05/29/2011  . Anxiety state, unspecified 08/19/2010  . Diffuse cystic mastopathy 12/23/2009  . Flatulence, eructation, and gas pain 12/03/2009  . Closed fracture of lumbar vertebra without mention of spinal cord injury [id number]  . Personal history of fall 01/29/2009  . Occlusion and stenosis of carotid artery without mention of cerebral infarction 12/27/2007  . Syncope and collapse 12/27/2007  . Other malaise and fatigue 12/06/2007  . Unspecified urinary incontinence 02/22/2005  . Cardiac pacemaker in situ 09/12/2004  . Unspecified hypothyroidism 03/18/2010  . Abnormality of gait 09/08/2003  . Edema 08/18/2003  . Macular degeneration (senile) of retina, unspecified 12/12/2001  . Undiagnosed cardiac murmurs 04/11/2001  . Senile osteoporosis 12/13/2000  . Legal blindness, as defined in [place] 03/20/1999    Past Surgical History  Procedure Laterality Date  . Pacemaker insertion  2006    Medtronic In-Pulse  . Left hip surgery  09/2005  . Right hip surgery  08/2003  . 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 Tonsillectomy    . Breast biopsy Left 1970    benign    Charlott Rakes MS, RD, LDN [phone] Pager [phone] Weekend/After Hours Pager

## 2014-03-10 NOTE — Evaluation (Signed)
Physical Therapy Evaluation Patient Details Name: Crystal CaoSara F. [name] MRN: [id number] DOB: 30-Jul-1915 Today's Date: 03/10/2014   History of Present Illness  L greater trochanter fx 2* fall, non-operative  Clinical Impression  *Pt admitted with L greater trochanter fx 2* fall*. Pt currently with functional limitations due to the deficits listed below (see PT Problem List).  Pt will benefit from skilled PT to increase their independence and safety with mobility to allow discharge to the venue listed below.   +2 total assist for bed to recliner transfer. Pt anxious, in pain,  and confused which limited activity tolerance. Max encouragement required for participation. Pt incontinent of urine and is legally blind.  SNF recommended.   **    Follow Up Recommendations SNF;Supervision/Assistance - 24 hour    Equipment Recommendations  None recommended by PT    Recommendations for Other Services       Precautions / Restrictions Precautions Precautions: Fall;Other (comment) Precaution Comments: per chart this is pt's 4th hip fx, h/o multiple falls; legally blind Restrictions Other Position/Activity Restrictions: WBAT      Mobility  Bed Mobility Overal bed mobility: +2 for physical assistance;Needs Assistance Bed Mobility: Supine to Sit     Supine to sit: +2 for physical assistance;Total assist     General bed mobility comments: assist to raise trunk and advance BLEs, pt 15%  Transfers Overall transfer level: Needs assistance   Transfers: Stand Pivot Transfers   Stand pivot transfers: +2 physical assistance;Total assist       General transfer comment: pt 20%, assist to rise and to pivot  Ambulation/Gait                Stairs            Wheelchair Mobility    Modified Rankin (Stroke Patients Only)       Balance Overall balance assessment: Needs assistance Sitting-balance support: Feet supported;Bilateral upper extremity supported Sitting balance-Leahy  Scale: Poor Sitting balance - Comments: poor initially with posterior lean, then fair      Standing balance-Leahy Scale: Zero                               Pertinent Vitals/Pain Pain Assessment: Faces Faces Pain Scale: Hurts even more Pain Location: L hip Pain Intervention(s): Monitored during session;Premedicated before session;Repositioned;Ice applied    Home Living Family/patient expects to be discharged to:: Skilled nursing facility                      Prior Function           Comments: per chart pt walked with a walker, pt confused so not able to give detailed info on this     Hand Dominance        Extremity/Trunk Assessment   Upper Extremity Assessment: Overall WFL for tasks assessed           Lower Extremity Assessment: LLE deficits/detail   LLE Deficits / Details: able to actively PF/DF ankle, assisted with ROM to L hip  Cervical / Trunk Assessment: Kyphotic  Communication   Communication: No difficulties  Cognition Arousal/Alertness: Awake/alert Behavior During Therapy: Anxious Overall Cognitive Status: No family/caregiver present to determine baseline cognitive functioning (pt oriented to self and location, not to situation)       Memory: Decreased short-term memory              General Comments  Exercises General Exercises - Lower Extremity Ankle Circles/Pumps: AROM;Both;10 reps;Supine Heel Slides: AAROM;Left;10 reps;Supine Hip ABduction/ADduction: AAROM;Left;10 reps;Supine      Assessment/Plan    PT Assessment Patient needs continued PT services  PT Diagnosis Difficulty walking;Acute pain;Generalized weakness   PT Problem List Decreased strength;Decreased activity tolerance;Decreased balance;Pain;Decreased knowledge of use of DME;Decreased mobility  PT Treatment Interventions DME instruction;Gait training;Functional mobility training;Therapeutic activities;Patient/family education;Balance  training;Therapeutic exercise   PT Goals (Current goals can be found in the Care Plan section) Acute Rehab PT Goals Patient Stated Goal: none stated PT Goal Formulation: Patient unable to participate in goal setting Potential to Achieve Goals: Fair    Frequency Min 5X/week   Barriers to discharge        Co-evaluation               End of Session Equipment Utilized During Treatment: Gait belt Activity Tolerance: Patient limited by pain;Other (comment) (anxiety) Patient left: in chair;with call bell/phone within reach;with chair alarm set Nurse Communication: Mobility status;Other (comment) (pt saturated in urine at start of tx, pt needs soft touch call bell due to poor vision)         Time: 1610-9604 PT Time Calculation (min): 12 min   Charges:   PT Evaluation $Initial PT Evaluation Tier I: 1 Procedure PT Treatments $Therapeutic Activity: 8-22 mins   PT G Codes:          Tamala Ser 03/10/2014, 10:23 AM [phone]

## 2014-03-11 ENCOUNTER — Non-Acute Institutional Stay (SKILLED_NURSING_FACILITY): Payer: 59 | Admitting: Nurse Practitioner

## 2014-03-11 ENCOUNTER — Encounter: Payer: Self-pay | Admitting: Nurse Practitioner

## 2014-03-11 DIAGNOSIS — I10 Essential (primary) hypertension: Secondary | ICD-10-CM

## 2014-03-11 DIAGNOSIS — E119 Type 2 diabetes mellitus without complications: Secondary | ICD-10-CM

## 2014-03-11 DIAGNOSIS — K5900 Constipation, unspecified: Secondary | ICD-10-CM

## 2014-03-11 DIAGNOSIS — S72009S Fracture of unspecified part of neck of unspecified femur, sequela: Secondary | ICD-10-CM

## 2014-03-11 DIAGNOSIS — E039 Hypothyroidism, unspecified: Secondary | ICD-10-CM

## 2014-03-11 DIAGNOSIS — S72142S Displaced intertrochanteric fracture of left femur, sequela: Secondary | ICD-10-CM

## 2014-03-11 DIAGNOSIS — N318 Other neuromuscular dysfunction of bladder: Secondary | ICD-10-CM

## 2014-03-11 DIAGNOSIS — F0390 Unspecified dementia without behavioral disturbance: Secondary | ICD-10-CM

## 2014-03-11 MED ORDER — FLEET ENEMA 7-19 GM/118ML RE ENEM: 1.0000 | ENEMA | Freq: Every day | RECTAL | Status: DC | PRN

## 2014-03-11 MED ORDER — BISACODYL 10 MG RE SUPP: 10.0000 mg | Freq: Every day | RECTAL | Status: DC | PRN

## 2014-03-11 MED ORDER — ENOXAPARIN SODIUM 40 MG/0.4ML ~~LOC~~ SOLN
40.0000 mg | SUBCUTANEOUS | Status: DC
Start: 2014-03-11 — End: 2014-09-24

## 2014-03-11 MED ORDER — HYDROCODONE-ACETAMINOPHEN 5-325 MG PO TABS: 1.0000 | ORAL_TABLET | Freq: Four times a day (QID) | ORAL | Status: DC | PRN

## 2014-03-11 MED ORDER — BISACODYL 10 MG RE SUPP
10.0000 mg | Freq: Every day | RECTAL | Status: DC | PRN
  Filled 2014-03-11: qty 1

## 2014-03-11 MED ORDER — MAGNESIUM HYDROXIDE 400 MG/5ML PO SUSP
30.0000 mL | Freq: Every day | ORAL | Status: DC
  Administered 2014-03-11: 30 mL via ORAL
  Filled 2014-03-11: qty 30

## 2014-03-11 MED ORDER — POLYETHYLENE GLYCOL 3350 17 GM/SCOOP PO POWD: 17.0000 g | Freq: Every day | ORAL | Status: DC

## 2014-03-11 NOTE — Progress Notes (Signed)
Pt / daughter in -law, [name], in agreement with d.c to SNF today via [facility] transport. Nsg reviewed d/c summary, scripts, avs. Scripts are included in d/c packet.  Cori RazorJamie Aracelia Brinson LCSW [phone]

## 2014-03-11 NOTE — Discharge Summary (Signed)
Physician Discharge Summary  [name]. [name] ZOX:[id number] DOB: 12/20/1914 DOA: 03/08/2014  PCP: Kimber Relic, MD  Admit date: 03/08/2014 Discharge date: 03/11/2014  Time spent: greater than 30 minutes  Recommendations for Outpatient Follow-up:  1. Continue PT, OT  Discharge Diagnoses:  Principal Problem:   Fracture of greater trochanter of left femur Active Problems:   Unspecified hypothyroidism   DM 2   HYPERLIPIDEMIA   HYPERTENSION   Pacemaker-dual  medtronic   Fall   Hypertonicity of bladder   Macular degeneration of both eyes   Dementia Chronic constipation  Discharge Condition: stable  Filed Weights   03/10/14 1500  Weight: 54.4 kg (119 lb 14.9 oz)    History of present illness:  80 year old female with past medical history of hypertension, dyslipidemia, diabetes, hypothyroidism who presented to [facility] ED 8/82015 status post fall at ALF. SHe fell 2 days ago and then yesterday. Pt reported having back pain and left and right hip pain, 5-6/10 in intensity, nom radiating. Pain was relieved with analgesia given in ED. No prodromal symptoms prior to the fall. No chest pain, shortness of breath, palpitations. No abdominal pain, nausea or vomiting. No reports of blood in stool or urine. No fevers or chills.  In ED, vitals are stable. Pelvic and spine x ray did not reveal acute fracture. CXR did not reveal acute cardiopulmonary process. CT scan did however reveal acute fracture in greater left trochanteric.   Hospital Course:  Patient was admitted to the hospitalist service. Dr. Margarita Rana consulted. Recommended nonoperative management and weightbearing as tolerated. He reports patient may followup with him in 2-4 weeks, though not essential. Bowel regimen was continued. Patient has been out of bed with physical therapy. Her pain is controlled. Her other medical problems have remained stable. She will go to skilled nursing facility. I recommend another 3 weeks of Lovenox for DVT  prophylaxis.  Procedures:  none  Consultations:  Orthopedics: T. Murphy  Discharge Exam: Filed Vitals:   03/11/14 0800  BP:   Pulse:   Temp:   Resp: 16    General: in chair, alert. Pleasantly confused. Cardiovascular: regular rate rhythm without murmurs gallops rubs Respiratory: clear to auscultation bilaterally without wheeze rhonchi or rales Abdomen: Soft nontender nondistended. Bowel sounds present. Extremities: No clubbing cyanosis or edema  Discharge Instructions   Diet general    Complete by:  As directed      Walk with assistance    Complete by:  As directed   WBAT            Medication List         amLODipine 10 MG tablet  Commonly known as:  NORVASC  Take 10 mg by mouth Daily.     bisacodyl 10 MG suppository  Commonly known as:  DULCOLAX  Place 1 suppository (10 mg total) rectally daily as needed for moderate constipation.     calcium-vitamin D 500-200 MG-UNIT per tablet  Commonly known as:  OSCAL WITH D  Take 1 tablet by mouth 2 (two) times daily.     enoxaparin 40 MG/0.4ML injection  Commonly known as:  LOVENOX  Inject 0.4 mLs (40 mg total) into the skin daily. For 3 weeks     HYDROcodone-acetaminophen 5-325 MG per tablet  Commonly known as:  NORCO/VICODIN  Take 1-2 tablets by mouth every 6 (six) hours as needed for moderate pain.     Lecithin 1200 MG Caps  Take 1,200 mg by mouth daily.  levothyroxine 25 MCG tablet  Commonly known as:  SYNTHROID, LEVOTHROID  Take 12.5 mg by mouth Daily.     metFORMIN 500 MG tablet  Commonly known as:  GLUCOPHAGE  2 (two) times daily.     metoprolol succinate 50 MG 24 hr tablet  Commonly known as:  TOPROL-XL  Take 50 mg by mouth Daily.     MILK OF MAGNESIA 400 MG/5ML suspension  Generic drug:  magnesium hydroxide  Take 30 mLs by mouth every other day. Every other day as needed for constipation     oxybutynin 5 MG 24 hr tablet  Commonly known as:  DITROPAN-XL  Take 5 mg by mouth Daily.      polyethylene glycol powder powder  Commonly known as:  GLYCOLAX/MIRALAX  Take 17 g by mouth daily.     PRESERVISION AREDS PO  Take 1 tablet by mouth 2 (two) times daily.     simvastatin 10 MG tablet  Commonly known as:  ZOCOR  Take 10 mg by mouth Daily.     sodium phosphate 7-19 GM/118ML Enem  Place 133 mLs (1 enema total) rectally daily as needed for severe constipation.     TYLENOL 325 MG tablet  Generic drug:  acetaminophen  Take 650 mg by mouth every 4 (four) hours as needed for mild pain or moderate pain.       Allergies  Allergen Reactions  . Codeine     unknown  . Ibuprofen     unknown  . Lisinopril     unknown  . Penicillins     unknown  . Philis NettleSanctura [Trospium Chloride]     unknown       Follow-up Information   Follow up with MURPHY, TIMOTHY, D, MD. (As needed 2-4 weeks)    Specialty:  Orthopedic Surgery   Contact information:   [address] [place] [address] [phone]       Follow up with GREEN, Lenon CurtARTHUR G, MD In 1 week.   Specialty:  Internal Medicine   Contact information:   [address] [place] [address] [phone]        The results of significant diagnostics from this hospitalization (including imaging, microbiology, ancillary and laboratory) are listed below for reference.    Significant Diagnostic Studies: Ct Abdomen Pelvis Wo Contrast  03/08/2014   CLINICAL DATA:  Low back pain.  Left hip pain.  EXAM: CT ABDOMEN AND PELVIS WITHOUT CONTRAST  TECHNIQUE: Multidetector CT imaging of the abdomen and pelvis was performed following the standard protocol without IV contrast.  COMPARISON:  CT abdomen and pelvis 05/25/2011. Lumbar spine radiographs 03/08/2014.  FINDINGS: The lung bases are clear without focal nodule, mass, or airspace disease. Coronary artery calcifications are present. Pacing wires are in place. Heart size is normal. No significant pleural or pericardial effusion is present.  The liver and spleen are  within normal limits. A small hiatal hernia is noted. Stomach, duodenum, and pancreas are otherwise unremarkable. The common bile duct and gallbladder are normal. The adrenal glands are normal bilaterally. The kidneys and ureters are within normal limits.  The rectosigmoid colon is within normal limits. The remainder the colon is unremarkable. The appendix is not discretely visualized and may be surgically absent. Small bowel is within normal limits.  Patient is status post right total hip arthroplasty. ORIF for intertrochanteric fracture is noted on the left.  A nondisplaced fracture is noted along the left greater trochanter. This is new since prior exam. This  may be a chronic injury.  A remote L5 compression fracture is present. No acute fracture or traumatic subluxation is present within the thoracolumbar spine.  IMPRESSION: 1. Nondisplaced fracture along the left greater trochanter is new since prior exam. It is unclear if this is acute or chronic injury. 2. Postoperative changes of both hips. 3. Remote L5 compression fracture. 4. Extensive atherosclerotic changes including coronary artery disease. 5. Small hiatal hernia. 6. No acute intra-abdominal lesion to explain the patient's symptoms.   Electronically Signed   By: Gennette Pac M.D.   On: 03/08/2014 12:40   Dg Chest 1 View  03/08/2014   CLINICAL DATA:  Fall.  EXAM: CHEST - 1 VIEW  COMPARISON:  05/25/2011  FINDINGS: Stable appearance of dual-chamber pacemaker. The heart size is stable and within normal limits. Lung volumes are low bilaterally. There is no evidence of pulmonary edema, consolidation, pneumothorax, nodule or pleural fluid.  IMPRESSION: Low lung volumes.  No active disease.   Electronically Signed   By: Irish Lack M.D.   On: 03/08/2014 09:39   Dg Lumbar Spine Complete  03/08/2014   CLINICAL DATA:  Fall on Thursday and yesterday. Back pain. Bilateral hip pain.  EXAM: LUMBAR SPINE - COMPLETE 4+ VIEW  COMPARISON:  03/08/2014  FINDINGS:  There is chronic wedge compression fracture of L5, T11, and T10. No acute fracture identified. No suspicious lytic or blastic lesions are identified. There is moderate atherosclerotic calcification of the aorta and its branches. Patient has had ORIF of the left hip and right hip arthroplasty.  IMPRESSION: 1. Chronic wedge compression fractures. 2.  No evidence for acute  abnormality.   Electronically Signed   By: Rosalie Gums M.D.   On: 03/08/2014 09:51   Dg Pelvis 1-2 Views  03/08/2014   CLINICAL DATA:  Fall today. Hip pain. History of bilateral hip surgery and hip fracture.  EXAM: PELVIS - 1-2 VIEW  COMPARISON:  CT of the abdomen and pelvis on 05/25/2011  FINDINGS: Patient has had left hip ORIF and right hip arthroplasty. Bones appear radiolucent. There is no evidence for acute fracture or subluxation. Visualized bowel gas pattern is nonobstructive. There is atherosclerotic calcification of the femoral arteries.  IMPRESSION: 1. Postoperative changes. 2.  No evidence for acute  abnormality.   Electronically Signed   By: Rosalie Gums M.D.   On: 03/08/2014 09:36   Ct Head Wo Contrast  03/08/2014   CLINICAL DATA:  falls, posterior scalp hematoma.  Recent falls.  EXAM: CT HEAD WITHOUT CONTRAST  TECHNIQUE: Contiguous axial images were obtained from the base of the skull through the vertex without intravenous contrast.  COMPARISON:  05/24/2011  FINDINGS: There is moderate central and cortical atrophy. Periventricular white matter changes are consistent with small vessel disease. There is no evidence for hemorrhage, mass lesion, or acute infarction.  Right posterior parietal scalp hematoma is noted. There is no underlying calvarial fracture. Small fluid level is identified within the right sphenoid sinus. No sinus wall fracture identified. There is atherosclerotic calcification of the internal carotid arteries.  IMPRESSION: 1. Atrophy and small vessel disease. 2.  No evidence for acute intracranial abnormality. 3. Right  posterior parietal scalp hematoma without underlying calvarial fracture. 4. Sinusitis involving the right sphenoid air cell. No evidence for sinus wall fracture   Electronically Signed   By: Rosalie Gums M.D.   On: 03/08/2014 09:25    Microbiology: No results found for this or any previous visit (from the past 240 hour(s)).  Labs: Basic Metabolic Panel:  Recent Labs Lab 03/08/14 0849 03/09/14 0425  NA 138 141  K 3.9 3.9  CL 100 103  CO2 27 24  GLUCOSE 152* 110*  BUN 17 19  CREATININE 0.84 0.75  CALCIUM 9.8 9.1   Liver Function Tests: No results found for this basename: AST, ALT, ALKPHOS, BILITOT, PROT, ALBUMIN,  in the last 168 hours No results found for this basename: LIPASE, AMYLASE,  in the last 168 hours No results found for this basename: AMMONIA,  in the last 168 hours CBC:  Recent Labs Lab 03/08/14 0849 03/09/14 0425  WBC 11.8* 10.7*  HGB 13.6 12.9  HCT 39.6 37.6  MCV 86.1 87.2  PLT 206 207   Cardiac Enzymes: No results found for this basename: CKTOTAL, CKMB, CKMBINDEX, TROPONINI,  in the last 168 hours BNP: BNP (last 3 results) No results found for this basename: PROBNP,  in the last 8760 hours CBG: No results found for this basename: GLUCAP,  in the last 168 hours   Signed:  Jadis Mika L  [facility] 03/11/2014, 11:00 AM

## 2014-03-11 NOTE — Progress Notes (Signed)
Report called to nurse godwin, at [facility]... D Actuaryranklin RN

## 2014-03-11 NOTE — Progress Notes (Signed)
Physical Therapy Treatment Patient Details Name: [name] MRN: [id number] DOB: 17-May-1915 Today's Date: 03/11/2014    History of Present Illness L greater trochanter fx 2* fall, non-operative    PT Comments    *+2 total assist for bed to recliner, pt with limited participation due to confusion, anxiety, poor vision. SNF recommended. **  Follow Up Recommendations  SNF;Supervision/Assistance - 24 hour     Equipment Recommendations  None recommended by PT    Recommendations for Other Services       Precautions / Restrictions Precautions Precautions: Fall;Other (comment) Precaution Comments: per chart this is pt's 4th hip fx, h/o multiple falls; legally blind Restrictions Other Position/Activity Restrictions: WBAT    Mobility  Bed Mobility Overal bed mobility: +2 for physical assistance;Needs Assistance Bed Mobility: Supine to Sit     Supine to sit: +2 for physical assistance;Total assist     General bed mobility comments: assist to raise trunk and advance BLEs, pt 15%  Transfers Overall transfer level: Needs assistance   Transfers: Stand Pivot Transfers   Stand pivot transfers: +2 physical assistance;Total assist       General transfer comment: pt 20%, assist to rise and to pivot  Ambulation/Gait                 Stairs            Wheelchair Mobility    Modified Rankin (Stroke Patients Only)       Balance     Sitting balance-Leahy Scale: Poor Sitting balance - Comments: poor balance with posterior lean requiring mod assist     Standing balance-Leahy Scale: Zero                      Cognition Arousal/Alertness: Lethargic Behavior During Therapy: Anxious Overall Cognitive Status: No family/caregiver present to determine baseline cognitive functioning (pt oriented to self and location, not to situation)       Memory: Decreased short-term memory              Exercises      General Comments         Pertinent Vitals/Pain Pain Assessment: Faces Faces Pain Scale: Hurts even more Pain Location: L hip Pain Intervention(s): Monitored during session;Repositioned;Ice applied    Home Living                      Prior Function            PT Goals (current goals can now be found in the care plan section) Acute Rehab PT Goals Patient Stated Goal: none stated PT Goal Formulation: Patient unable to participate in goal setting Potential to Achieve Goals: Fair Progress towards PT goals: Not progressing toward goals - comment (pt confused, not oriented)    Frequency  Min 3X/week    PT Plan Current plan remains appropriate    Co-evaluation PT/OT/SLP Co-Evaluation/Treatment: Yes Reason for Co-Treatment: Complexity of the patient's impairments (multi-system involvement);For patient/therapist safety PT goals addressed during session: Mobility/safety with mobility;Balance       End of Session Equipment Utilized During Treatment: Gait belt Activity Tolerance: Patient limited by pain;Other (comment) (anxiety) Patient left: in chair;with call bell/phone within reach;with chair alarm set     Time: [phone] PT Time Calculation (min): 23 min  Charges:  $Therapeutic Activity: 8-22 mins                    G Codes:  Ralene BatheUhlenberg, [name] 03/11/2014, 9:58 AM [phone]

## 2014-03-11 NOTE — Evaluation (Signed)
Occupational Therapy Evaluation Patient Details Name: [name] MRN: [id number] DOB: 1914/09/30 Today's Date: 03/11/2014    History of Present Illness L greater trochanter fx 2* fall, non-operative   Clinical Impression   Pt lethargic and difficult to arouse at start of session. Pt able to interact some during session and participated with simple grooming task of washing her face with assist to initiate and for thoroghness. Up to chair with +2 assist. Will see pt for trial of OT to progress ADL independence for next venue.     Follow Up Recommendations  SNF;Supervision/Assistance - 24 hour    Equipment Recommendations  3 in 1 bedside comode    Recommendations for Other Services       Precautions / Restrictions Precautions Precautions: Fall;Other (comment) Precaution Comments: per chart this is pt's 4th hip fx, h/o multiple falls; legally blind Restrictions Weight Bearing Restrictions: No Other Position/Activity Restrictions: WBAT      Mobility Bed Mobility Overal bed mobility: +2 for physical assistance;Needs Assistance Bed Mobility: Supine to Sit     Supine to sit: +2 for physical assistance;Total assist     General bed mobility comments: assist to raise trunk and advance BLEs, pt 15%  Transfers Overall transfer level: Needs assistance   Transfers: Stand Pivot Transfers   Stand pivot transfers: +2 physical assistance;Total assist       General transfer comment: pt 20%, assist to rise and to pivot    Balance     Sitting balance-Leahy Scale: Poor Sitting balance - Comments: poor balance with posterior lean requiring mod assist     Standing balance-Leahy Scale: Zero                              ADL Overall ADL's : Needs assistance/impaired Eating/Feeding: Moderate assistance;Sitting Eating/Feeding Details (indicate cue type and reason): to hold cup and bring to her mouth. Grooming: Wash/dry face;Sitting;Moderate assistance Grooming  Details (indicate cue type and reason): for thoroughness. pt doesnt initiate well and needs hand over hand to start motion and then she washed cheeks and chin.  Upper Body Bathing: Total assistance;Sitting Upper Body Bathing Details (indicate cue type and reason): supported Lower Body Bathing: +2 for physical assistance;Total assistance;Sit to/from stand   Upper Body Dressing : Total assistance;Sitting Upper Body Dressing Details (indicate cue type and reason): supported Lower Body Dressing: +2 for physical assistance;Total assistance;Sit to/from stand   Toilet Transfer: +2 for physical assistance;Total assistance;Stand-pivot   Toileting- Clothing Manipulation and Hygiene: +2 for physical assistance;Total assistance;Sit to/from stand         General ADL Comments: Pt asleep upon arrival and difficult to arouse. Pt opened eyes and started to verbalize a little, stating she didnt want to get up. Explained benefits of OOB activitiy. Assisted pt via stand pivot to chair. Worked on grooming task in chair with washing her face. She doesnt initiate so needs hand over hand to start washing face. She stated she didnt want to comb her hair.      Vision                     Perception     Praxis      Pertinent Vitals/Pain Pain Assessment: Faces Faces Pain Scale: Hurts even more Pain Location: L hip Pain Intervention(s): Monitored during session;Repositioned;Ice applied     Hand Dominance     Extremity/Trunk Assessment Upper Extremity Assessment Upper Extremity Assessment: RUE deficits/detail;LUE deficits/detail RUE Deficits /  Details: AAROM shoulder flexion WFL. Pt didnt initiate on her own to raise arm but did help once therapist assisted. Able to grip washcloth and bring to her face.  LUE Deficits / Details: AAROM pt stating L UE with discomfort with shoulder flexion.            Communication Communication Communication: No difficulties   Cognition Arousal/Alertness:  Lethargic Behavior During Therapy: Anxious Overall Cognitive Status: No family/caregiver present to determine baseline cognitive functioning       Memory: Decreased short-term memory             General Comments       Exercises       Shoulder Instructions      Home Living Family/patient expects to be discharged to:: Skilled nursing facility                                        Prior Functioning/Environment          Comments: per chart pt walked with a walker, pt confused so not able to give detailed info on this    OT Diagnosis: Generalized weakness;Acute pain   OT Problem List: Decreased strength;Decreased knowledge of use of DME or AE;Pain   OT Treatment/Interventions: Self-care/ADL training;Patient/family education;Therapeutic activities;DME and/or AE instruction    OT Goals(Current goals can be found in the care plan section) Acute Rehab OT Goals Patient Stated Goal: none stated OT Goal Formulation: Patient unable to participate in goal setting Time For Goal Achievement: 03/18/14 Potential to Achieve Goals: Good  OT Frequency: Min 2X/week   Barriers to D/C:            Co-evaluation PT/OT/SLP Co-Evaluation/Treatment: Yes Reason for Co-Treatment: For patient/therapist safety PT goals addressed during session: Mobility/safety with mobility;Balance OT goals addressed during session: ADL's and self-care;Proper use of Adaptive equipment and DME      End of Session Equipment Utilized During Treatment: Gait belt  Activity Tolerance: Patient limited by pain Patient left: in chair;with call bell/phone within reach;with chair alarm set   Time: [phone] OT Time Calculation (min): 27 min Charges:  OT General Charges $OT Visit: 1 Procedure OT Evaluation $Initial OT Evaluation Tier I: 1 Procedure OT Treatments $Self Care/Home Management : 8-22 mins G-Codes:    [name] [phone] 03/11/2014, 10:08 AM

## 2014-03-11 NOTE — Progress Notes (Addendum)
CSW has contacted [facility] to check status of [employer] insurance authorization. A message has been left requesting sw to contact CSW with an update.  Cori RazorJamie Dajae Kizer LCSW [phone]  [phone] : [facility] is able to admit pt today if stable for d/c. CSW will assist with d/c planning to SNF.  Cori RazorJamie Godson Pollan LCSW [phone]

## 2014-03-11 NOTE — Progress Notes (Signed)
Patient ID: [name]. [name], female   DOB: 02/16/1915, 80 y.o.   MRN: [id number]   Code Status: DNR  Chief Complaint:  Chief Complaint  Patient presents with  . Medical Management of Chronic Issues  . Hospitalization Follow-up     HPI: seen today in the SNF  @ [facility] for evaluation of Fracture of greater trochanter of left femur and chronic condition   Hospitalized 03/08/2014-03/11/2014 for Fracture of greater trochanter of left femur. She is at [facility] for PT/OT. She presented to ED with fell 2 days ago and then yesterday. Pt reported having back pain and left and right hip pain. Pain was relieved with analgesia given in ED. In ED, Pelvic and spine x ray did not reveal acute fracture. CXR did not reveal acute cardiopulmonary process. CT scan did however reveal acute fracture in greater left trochanteric. Dr. Margarita Rana consulted. Recommended nonoperative management and weightbearing as tolerated and another 3 weeks of Lovenox for DVT prophylaxis.       Problem List Items Addressed This Visit   Unspecified hypothyroidism     Takes Levothyroxine 12.85mcg, 01/16/14 TSH 2.474        DM - Primary     takes Metformin 500mg  bid, CBG ac breakfast: 118, 128, 129 since admitted to SNF [facility]    HYPERTENSION     Controlled on Metoprolol 50mg  daily and Amlodipine 10mg  daily      Hypertonicity of bladder     Better with Oxybutynin 5mg  daily.      Dementia     Confused time, place, person-more anxious and requires more assistance with ADLs prior to SNF. Progression of memory lapses, impaired vision and hearing, increased frailty all contribute to her mood and decline in her ADL function. Will continue to monitor weight-daily, food intake, and mood. Will obtain Memory Test. Staff reported patient made statement-just want to die x1 and refusing to eat very much.       Unspecified constipation     Stable, continue MiraLax daily.     Trochanteric fracture of left femur     CT scan did however  reveal acute fracture in greater left trochanteric. Dr. Margarita Rana consulted. Recommended nonoperative management and weightbearing as tolerated and another 3 weeks of Lovenox for DVT prophylaxis.           Review of Systems:  Review of Systems  Constitutional: Positive for malaise/fatigue. Negative for fever, chills, weight loss and diaphoresis.  HENT: Positive for hearing loss (severe with hearing aids. ). Negative for congestion, ear discharge, ear pain, nosebleeds, sore throat and tinnitus.   Eyes: Positive for blurred vision (macular degeneration ). Negative for pain, discharge and redness.  Respiratory: Negative for cough, hemoptysis, sputum production, shortness of breath, wheezing and stridor.   Cardiovascular: Negative for chest pain, palpitations, orthopnea, claudication, leg swelling and PND.  Gastrointestinal: Negative for heartburn, nausea, vomiting, abdominal pain, diarrhea, constipation, blood in stool and melena.  Genitourinary: Positive for frequency (chronic). Negative for dysuria, urgency, hematuria and flank pain.  Musculoskeletal: Positive for falls and joint pain. Negative for back pain, myalgias and neck pain.       Left hip pain with ROM and weight bearing.   Skin: Negative for itching and rash.  Neurological: Negative for dizziness, tingling, tremors, sensory change, speech change, focal weakness, seizures, loss of consciousness, weakness and headaches.  Endo/Heme/Allergies: Negative for environmental allergies and polydipsia. Does not bruise/bleed easily.  Psychiatric/Behavioral: Positive for depression and memory loss. Negative for hallucinations. The  patient is not nervous/anxious and does not have insomnia.      Medications: Patient's Medications  New Prescriptions   No medications on file  Previous Medications   ACETAMINOPHEN (TYLENOL) 325 MG TABLET    Take 650 mg by mouth every 4 (four) hours as needed for mild pain or moderate pain.    AMLODIPINE  (NORVASC) 10 MG TABLET    Take 10 mg by mouth Daily.   BISACODYL (DULCOLAX) 10 MG SUPPOSITORY    Place 1 suppository (10 mg total) rectally daily as needed for moderate constipation.   CALCIUM-VITAMIN D (OSCAL WITH D) 500-200 MG-UNIT PER TABLET    Take 1 tablet by mouth 2 (two) times daily.   ENOXAPARIN (LOVENOX) 40 MG/0.4ML INJECTION    Inject 0.4 mLs (40 mg total) into the skin daily. For 3 weeks   HYDROCODONE-ACETAMINOPHEN (NORCO/VICODIN) 5-325 MG PER TABLET    Take 1-2 tablets by mouth every 6 (six) hours as needed for moderate pain.   LECITHIN 1200 MG CAPS    Take 1,200 mg by mouth daily.    LEVOTHYROXINE (SYNTHROID, LEVOTHROID) 25 MCG TABLET    Take 12.5 mg by mouth Daily.   MAGNESIUM HYDROXIDE (MILK OF MAGNESIA) 400 MG/5ML SUSPENSION    Take 30 mLs by mouth every other day. Every other day as needed for constipation   METFORMIN (GLUCOPHAGE) 500 MG TABLET    2 (two) times daily.   METOPROLOL SUCCINATE (TOPROL-XL) 50 MG 24 HR TABLET    Take 50 mg by mouth Daily.   MULTIPLE VITAMINS-MINERALS (PRESERVISION AREDS PO)    Take 1 tablet by mouth 2 (two) times daily.    OXYBUTYNIN (DITROPAN-XL) 5 MG 24 HR TABLET    Take 5 mg by mouth Daily.   POLYETHYLENE GLYCOL POWDER (GLYCOLAX/MIRALAX) POWDER    Take 17 g by mouth daily.   SIMVASTATIN (ZOCOR) 10 MG TABLET    Take 10 mg by mouth Daily.   SODIUM PHOSPHATE (FLEET) 7-19 GM/118ML ENEM    Place 133 mLs (1 enema total) rectally daily as needed for severe constipation.  Modified Medications   No medications on file  Discontinued Medications   No medications on file     Physical Exam: Physical Exam  Constitutional: She is oriented to person, place, and time. She appears well-developed and well-nourished.  HENT:  Head: Normocephalic and atraumatic.  Right Ear: External ear normal.  Left Ear: External ear normal.  Nose: Nose normal.  Mouth/Throat: Oropharynx is clear and moist. No oropharyngeal exudate.  Eyes: Conjunctivae and EOM are normal.  Pupils are equal, round, and reactive to light. Right eye exhibits no discharge. Left eye exhibits no discharge. No scleral icterus.  Neck: Normal range of motion. Neck supple. No JVD present. No thyromegaly present.  Cardiovascular: Normal rate.   Murmur heard. Pacemaker. Systolic ejection murmur 3/6  Pulmonary/Chest: Effort normal and breath sounds normal. No respiratory distress. She has no wheezes. She has no rales. She exhibits no tenderness.  Abdominal: Soft. Bowel sounds are normal. She exhibits no distension. There is no tenderness. There is no rebound.  Musculoskeletal: Normal range of motion. She exhibits no edema and no tenderness.  Lymphadenopathy:    She has no cervical adenopathy.  Neurological: She is alert and oriented to person, place, and time. She has normal reflexes. No cranial nerve deficit. She exhibits normal muscle tone. Coordination normal.  Skin: Skin is warm and dry. No rash noted. She is not diaphoretic. No erythema. No pallor.  Psychiatric: Thought content  normal. Her mood appears not anxious. Her affect is not angry, not blunt, not labile and not inappropriate. Her speech is not rapid and/or pressured, not delayed and not slurred. She is slowed. She is not agitated, not aggressive, not hyperactive, not withdrawn, not actively hallucinating and not combative. Thought content is not paranoid and not delusional. Cognition and memory are impaired. She expresses impulsivity and inappropriate judgment. She exhibits a depressed mood. She exhibits abnormal recent memory.  Flat affect. Confusion-going to bed before dinner frequently.  She is attentive.     Filed Vitals:   03/11/14 1646  BP: 142/64  Pulse: 60  Temp: 98.5 F (36.9 C)  TempSrc: Tympanic  Resp: 18      Labs reviewed: Basic Metabolic Panel:  Recent Labs  16/10/96 01/16/14 03/08/14 0849 03/09/14 0425  NA 141  --  138 141  K 3.9  --  3.9 3.9  CL  --   --  100 103  CO2  --   --  27 24  GLUCOSE  --    --  152* 110*  BUN 18  --  17 19  CREATININE 0.9  --  0.84 0.75  CALCIUM  --   --  9.8 9.1  TSH 2.50 2.47  --   --     Liver Function Tests:  Recent Labs  08/27/13  AST 37*  ALT 23  ALKPHOS 82    CBC:  Recent Labs  08/27/13 03/08/14 0849 03/09/14 0425  WBC 8.3 11.8* 10.7*  HGB 15.2 13.6 12.9  HCT 44 39.6 37.6  MCV  --  86.1 87.2  PLT 296 206 207   Past Procedure:  03/06/14 X-ray pelvis([facility]) no acute osseous abnormality. Chronic degenerative and postsurgical findings(s/p R hip arthroplasty and left femoral neck compression screw with associated proximal femoral sideplate and fixation screws-no hardware loosening or migration visible at imaged components. R inferior ischial and inferior right pubic ramus osseous deformities consistent with previously identified remote fracture injuries)   03/08/14 X-ray lumbar spine:  IMPRESSION: 1. Chronic wedge compression fractures. 2.  No evidence for acute  abnormality.  03/08/14 CXR  IMPRESSION: Low lung volumes.  No active disease.  03/08/14 X-ray pelvis  IMPRESSION: 1. Postoperative changes. 2.  No evidence for acute  abnormality.  03/08/14 CT head w/o contrast:  IMPRESSION: 1. Atrophy and small vessel disease. 2.  No evidence for acute intracranial abnormality. 3. Right posterior parietal scalp hematoma without underlying calvarial fracture. 4. Sinusitis involving the right sphenoid air cell. No evidence for sinus wall fracture   03/08/14 CT pelvis and abd w/o contrast:  IMPRESSION: 1. Nondisplaced fracture along the left greater trochanter is new since prior exam. It is unclear if this is acute or chronic injury. 2. Postoperative changes of both hips. 3. Remote L5 compression fracture. 4. Extensive atherosclerotic changes including coronary artery disease. 5. Small hiatal hernia. 6. No acute intra-abdominal lesion to explain the patient's symptoms. Assessment/Plan DM takes Metformin 500mg  bid, CBG ac breakfast:  118, 128, 129 since admitted to SNF [facility]  Dementia Confused time, place, person-more anxious and requires more assistance with ADLs prior to SNF. Progression of memory lapses, impaired vision and hearing, increased frailty all contribute to her mood and decline in her ADL function. Will continue to monitor weight-daily, food intake, and mood. Will obtain Memory Test. Staff reported patient made statement-just want to die x1 and refusing to eat very much.     HYPERTENSION Controlled on Metoprolol 50mg  daily and Amlodipine 10mg   daily    Unspecified hypothyroidism Takes Levothyroxine 12.675mcg, 01/16/14 TSH 2.474      Hypertonicity of bladder Better with Oxybutynin 5mg  daily.    Unspecified constipation Stable, continue MiraLax daily.   Trochanteric fracture of left femur CT scan did however reveal acute fracture in greater left trochanteric. Dr. Margarita Ranaimothy Murphy consulted. Recommended nonoperative management and weightbearing as tolerated and another 3 weeks of Lovenox for DVT prophylaxis.          Family/ staff Communication: safety   Goals of care: SNF   Labs/tests ordered: none

## 2014-03-11 NOTE — Addendum Note (Signed)
Addended by: Kimber RelicGREEN, Keaston Pile G on: 03/11/2014 01:35 PM   Modules accepted: Level of Service

## 2014-03-13 ENCOUNTER — Non-Acute Institutional Stay (SKILLED_NURSING_FACILITY): Payer: 59 | Admitting: Internal Medicine

## 2014-03-13 ENCOUNTER — Encounter: Payer: Self-pay | Admitting: Internal Medicine

## 2014-03-13 DIAGNOSIS — E039 Hypothyroidism, unspecified: Secondary | ICD-10-CM

## 2014-03-13 DIAGNOSIS — I4891 Unspecified atrial fibrillation: Secondary | ICD-10-CM

## 2014-03-13 DIAGNOSIS — F0390 Unspecified dementia without behavioral disturbance: Secondary | ICD-10-CM

## 2014-03-13 DIAGNOSIS — E119 Type 2 diabetes mellitus without complications: Secondary | ICD-10-CM

## 2014-03-13 DIAGNOSIS — I482 Chronic atrial fibrillation, unspecified: Secondary | ICD-10-CM

## 2014-03-13 DIAGNOSIS — I10 Essential (primary) hypertension: Secondary | ICD-10-CM

## 2014-03-13 DIAGNOSIS — Z950 Presence of cardiac pacemaker: Secondary | ICD-10-CM

## 2014-03-13 DIAGNOSIS — S72109A Unspecified trochanteric fracture of unspecified femur, initial encounter for closed fracture: Secondary | ICD-10-CM

## 2014-03-13 DIAGNOSIS — W19XXXA Unspecified fall, initial encounter: Secondary | ICD-10-CM

## 2014-03-13 DIAGNOSIS — S72112A Displaced fracture of greater trochanter of left femur, initial encounter for closed fracture: Secondary | ICD-10-CM

## 2014-03-13 NOTE — Progress Notes (Signed)
Patient ID: [name]. Garguilo, female   DOB: [birth year], 80 y.o.   MRN: [id number]    Location:  [facility]   Place of Service: SNF (31)  Extended Emergency Contact Information Primary Emergency Contact: Cook,[name] Address: [place], [place] Phone: [phone] Mobile Phone: [phone] Relation: Son Secondary Emergency Contact: Cottie Banda, Ken C[place] [address] [place] Phone: [phone] Relation: None   Chief Complaint  Patient presents with  . Hospitalization Follow-up    New admit to SNF    HPI:  New admit to SNF 03/08/14 from hospital wher she was transferred to from [facility] on 03/08/14 for pain in the left greater trochanter. She has a fracture. Ortho, Dr. Alain Marion has elected non-operative management. She is to be weight bearing as tolerated. She remains a high fall risk. She had several falls prior to the one that caused her fracture. Her dementia makes her actions impusive at times.  She has multiple co-morbidities that are noted below.  Past Medical History  Diagnosis Date  . HTN (hypertension)   . Other and unspecified hyperlipidemia   . DM (diabetes mellitus)   . Hyperthyroidism   . Hemorrhoids   . AF (atrial fibrillation)   . Rectal bleeding   . Sinus node dysfunction   . Hip fracture   . Bradycardia   . Aortic stenosis   . Mitral valve prolapse   . Hearing loss   . Memory loss 02/16/2012  . Disturbance of salivary secretion [id number]  . Unspecified hereditary and idiopathic peripheral neuropathy 10/28/202012  . Closed fracture of unspecified trochanteric section of femur 05/29/2011  . Anxiety state, unspecified 08/19/2010  . Diffuse cystic mastopathy 12/23/2009  . Flatulence, eructation, and gas pain 12/03/2009  . Closed fracture of lumbar vertebra without mention of spinal cord injury [id number]  . Personal history of fall 01/29/2009  . Occlusion and stenosis of carotid artery  without mention of cerebral infarction 12/27/2007  . Syncope and collapse 12/27/2007  . Other malaise and fatigue 12/06/2007  . Unspecified urinary incontinence 02/22/2005  . Cardiac pacemaker in situ 09/12/2004  . Unspecified hypothyroidism 03/18/2010  . Abnormality of gait 09/08/2003  . Edema 08/18/2003  . Macular degeneration (senile) of retina, unspecified 12/12/2001  . Undiagnosed cardiac murmurs 04/11/2001  . Senile osteoporosis 12/13/2000  . Legal blindness, as defined in [place] 03/20/1999    secondary to macular degeneration  . Fracture of greater trochanter of left femur 03/08/14    Past Surgical History  Procedure Laterality Date  . Pacemaker insertion  2006    Medtronic In-Pulse  . Left hip surgery  09/2005  . Right hip surgery  08/2003  . 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 Tonsillectomy    . Breast biopsy Left 1970    benign    History   Social History  . Marital Status: Widowed    Spouse Name: N/A    Number of Children: N/A  . Years of Education: N/A   Occupational History  . Not on file.   Social History Main Topics  . Smoking status: Never Smoker   . Smokeless tobacco: Never Used  . Alcohol Use: No  . Drug Use: No  . Sexual Activity: No   Other Topics Concern  . Not on file  Social History Narrative   Lives at [facility] since 2010 [facility]   Widowed   Never smoked   Alcohol none   Exercise none   Walks with walker   DNR/ Living Will     reports that she has never smoked. She has never used smokeless tobacco. She reports that she does not drink alcohol or use illicit drugs.  Immunization History  Administered Date(s) Administered  . Influenza-Unspecified 06/06/2013  . PPD Test 07/23/2011  . Pneumococcal Polysaccharide-23 03/14/2009  . Td 03/14/2009  . Zoster 06/29/2006    Allergies  Allergen Reactions  . Codeine     unknown  . Ibuprofen     unknown  . Lisinopril      unknown  . Penicillins     unknown  . Sanctura [Trospium Chloride]     unknown    Medications: Patient's Medications  New Prescriptions   No medications on file  Previous Medications   ACETAMINOPHEN (TYLENOL) 325 MG TABLET    Take 650 mg by mouth every 4 (four) hours as needed for mild pain or moderate pain.    AMLODIPINE (NORVASC) 10 MG TABLET    Take 10 mg by mouth Daily.   BISACODYL (DULCOLAX) 10 MG SUPPOSITORY    Place 1 suppository (10 mg total) rectally daily as needed for moderate constipation.   CALCIUM-VITAMIN D (OSCAL WITH D) 500-200 MG-UNIT PER TABLET    Take 1 tablet by mouth 2 (two) times daily.   ENOXAPARIN (LOVENOX) 40 MG/0.4ML INJECTION    Inject 0.4 mLs (40 mg total) into the skin daily. For 3 weeks   HYDROCODONE-ACETAMINOPHEN (NORCO/VICODIN) 5-325 MG PER TABLET    Take 1-2 tablets by mouth every 6 (six) hours as needed for moderate pain.   LECITHIN 1200 MG CAPS    Take 1,200 mg by mouth daily.    LEVOTHYROXINE (SYNTHROID, LEVOTHROID) 25 MCG TABLET    Take 12.5 mg by mouth Daily.   MAGNESIUM HYDROXIDE (MILK OF MAGNESIA) 400 MG/5ML SUSPENSION    Take 30 mLs by mouth every other day. Every other day as needed for constipation   METFORMIN (GLUCOPHAGE) 500 MG TABLET    2 (two) times daily.   METOPROLOL SUCCINATE (TOPROL-XL) 50 MG 24 HR TABLET    Take 50 mg by mouth Daily.   MULTIPLE VITAMINS-MINERALS (PRESERVISION AREDS PO)    Take 1 tablet by mouth 2 (two) times daily.    OXYBUTYNIN (DITROPAN-XL) 5 MG 24 HR TABLET    Take 5 mg by mouth Daily.   POLYETHYLENE GLYCOL POWDER (GLYCOLAX/MIRALAX) POWDER    Take 17 g by mouth daily.   SIMVASTATIN (ZOCOR) 10 MG TABLET    Take 10 mg by mouth Daily.   SODIUM PHOSPHATE (FLEET) 7-19 GM/118ML ENEM    Place 133 mLs (1 enema total) rectally daily as needed for severe constipation.  Modified Medications   No medications on file  Discontinued Medications   No medications on file     Review of Systems  Constitutional: Positive for  fatigue and unexpected weight change. Negative for fever, chills, diaphoresis, activity change and appetite change.  HENT: Positive for hearing loss. Negative for congestion, ear discharge, ear pain, postnasal drip, rhinorrhea, sore throat, tinnitus, trouble swallowing and voice change.   Eyes: Positive for visual disturbance (legally blind. Severe macular degeneration). Negative for pain, redness and itching.  Respiratory: Negative for cough, choking, shortness of breath and wheezing.   Cardiovascular: Negative for chest pain, palpitations and leg swelling.  Gastrointestinal:  Negative for nausea, abdominal pain, diarrhea, constipation and abdominal distention.  Endocrine: Negative for cold intolerance, heat intolerance, polydipsia, polyphagia and polyuria.  Genitourinary: Positive for frequency. Negative for dysuria, urgency, hematuria, flank pain, vaginal discharge, difficulty urinating and pelvic pain.  Musculoskeletal: Positive for gait problem. Negative for arthralgias, back pain, myalgias, neck pain and neck stiffness.       Multiple falls Fracture of the left greater trochanter 03/08/14. Painful to stand or move.  Skin: Negative for color change, pallor and rash.  Allergic/Immunologic: Negative.   Neurological: Negative for dizziness, tremors, seizures, syncope, weakness, numbness and headaches.  Hematological: Negative for adenopathy. Bruises/bleeds easily.  Psychiatric/Behavioral: Positive for confusion and dysphoric mood. Negative for suicidal ideas, hallucinations, behavioral problems, sleep disturbance and agitation. The patient is nervous/anxious. The patient is not hyperactive.     Filed Vitals:   03/13/14 1642  BP: 147/52  Pulse: 68  Temp: 99.3 F (37.4 C)  Resp: 20  Height: 5' 2"  (1.575 m)  Weight: 123 lb (55.792 kg)  SpO2: 92%   Body mass index is 22.49 kg/(m^2).  Physical Exam  Constitutional: She is oriented to person, place, and time. She appears well-nourished.    frail  HENT:  Head: Normocephalic and atraumatic.  Right Ear: External ear normal.  Left Ear: External ear normal.  Nose: Nose normal.  Mouth/Throat: Oropharynx is clear and moist. No oropharyngeal exudate.  Eyes: Conjunctivae and EOM are normal. Pupils are equal, round, and reactive to light. Right eye exhibits no discharge. Left eye exhibits no discharge. No scleral icterus.  Legally blind  Neck: Normal range of motion. Neck supple. No JVD present. No thyromegaly present.  Cardiovascular: Normal rate.   Murmur heard. Pacemaker. Systolic ejection murmur 3/6  Pulmonary/Chest: Effort normal and breath sounds normal. No respiratory distress. She has no wheezes. She has no rales. She exhibits no tenderness.  Abdominal: Soft. Bowel sounds are normal. She exhibits no distension and no mass. There is no tenderness. There is no rebound.  Musculoskeletal: Normal range of motion. She exhibits no edema and no tenderness.  Pain left hip with standing or movement.  Lymphadenopathy:    She has no cervical adenopathy.  Neurological: She is alert and oriented to person, place, and time. She has normal reflexes. No cranial nerve deficit. She exhibits normal muscle tone. Coordination normal.  Dementia  Skin: Skin is warm and dry. No rash noted. She is not diaphoretic. No erythema. No pallor.  Psychiatric: Thought content normal. Her mood appears not anxious. Her affect is not angry, not blunt, not labile and not inappropriate. Her speech is not rapid and/or pressured, not delayed and not slurred. She is slowed. She is not agitated, not aggressive, not hyperactive, not withdrawn, not actively hallucinating and not combative. Thought content is not paranoid and not delusional. Cognition and memory are impaired. She expresses impulsivity and inappropriate judgment. She exhibits a depressed mood. She exhibits abnormal recent memory.  Flat affect. Confusion-going to bed before dinner frequently.  She is  attentive.     Labs reviewed: Admission on 03/08/2014, Discharged on 03/11/2014  Component Date Value Ref Range Status  . WBC 03/08/2014 11.8* 4.0 - 10.5 K/uL Final  . RBC 03/08/2014 4.60  3.87 - 5.11 MIL/uL Final  . Hemoglobin 03/08/2014 13.6  12.0 - 15.0 g/dL Final  . HCT 03/08/2014 39.6  36.0 - 46.0 % Final  . MCV 03/08/2014 86.1  78.0 - 100.0 fL Final  . MCH 03/08/2014 29.6  26.0 - 34.0 pg Final  .  MCHC 03/08/2014 34.3  30.0 - 36.0 g/dL Final  . RDW 03/08/2014 12.6  11.5 - 15.5 % Final  . Platelets 03/08/2014 206  150 - 400 K/uL Final  . Sodium 03/08/2014 138  137 - 147 mEq/L Final  . Potassium 03/08/2014 3.9  3.7 - 5.3 mEq/L Final  . Chloride 03/08/2014 100  96 - 112 mEq/L Final  . CO2 03/08/2014 27  19 - 32 mEq/L Final  . Glucose, Bld 03/08/2014 152* 70 - 99 mg/dL Final  . BUN 03/08/2014 17  6 - 23 mg/dL Final  . Creatinine, Ser 03/08/2014 0.84  0.50 - 1.10 mg/dL Final  . Calcium 03/08/2014 9.8  8.4 - 10.5 mg/dL Final  . GFR calc non Af Amer 03/08/2014 57* >90 mL/min Final  . GFR calc Af Amer 03/08/2014 66* >90 mL/min Final   Comment: (NOTE)                          The eGFR has been calculated using the CKD EPI equation.                          This calculation has not been validated in all clinical situations.                          eGFR's persistently <90 mL/min signify possible Chronic Kidney                          Disease.  . Anion gap 03/08/2014 11  5 - 15 Final  . Color, Urine 03/08/2014 YELLOW  YELLOW Final  . APPearance 03/08/2014 CLEAR  CLEAR Final  . Specific Gravity, Urine 03/08/2014 1.019  1.005 - 1.030 Final  . pH 03/08/2014 5.5  5.0 - 8.0 Final  . Glucose, UA 03/08/2014 NEGATIVE  NEGATIVE mg/dL Final  . Hgb urine dipstick 03/08/2014 NEGATIVE  NEGATIVE Final  . Bilirubin Urine 03/08/2014 NEGATIVE  NEGATIVE Final  . Ketones, ur 03/08/2014 NEGATIVE  NEGATIVE mg/dL Final  . Protein, ur 03/08/2014 NEGATIVE  NEGATIVE mg/dL Final  . Urobilinogen, UA  03/08/2014 0.2  0.0 - 1.0 mg/dL Final  . Nitrite 03/08/2014 NEGATIVE  NEGATIVE Final  . Leukocytes, UA 03/08/2014 NEGATIVE  NEGATIVE Final   MICROSCOPIC NOT DONE ON URINES WITH NEGATIVE PROTEIN, BLOOD, LEUKOCYTES, NITRITE, OR GLUCOSE <1000 mg/dL.  Marland Kitchen Sodium 03/09/2014 141  137 - 147 mEq/L Final  . Potassium 03/09/2014 3.9  3.7 - 5.3 mEq/L Final  . Chloride 03/09/2014 103  96 - 112 mEq/L Final  . CO2 03/09/2014 24  19 - 32 mEq/L Final  . Glucose, Bld 03/09/2014 110* 70 - 99 mg/dL Final  . BUN 03/09/2014 19  6 - 23 mg/dL Final  . Creatinine, Ser 03/09/2014 0.75  0.50 - 1.10 mg/dL Final  . Calcium 03/09/2014 9.1  8.4 - 10.5 mg/dL Final  . GFR calc non Af Amer 03/09/2014 69* >90 mL/min Final  . GFR calc Af Amer 03/09/2014 80* >90 mL/min Final   Comment: (NOTE)                          The eGFR has been calculated using the CKD EPI equation.                          This calculation  has not been validated in all clinical situations.                          eGFR's persistently <90 mL/min signify possible Chronic Kidney                          Disease.  . Anion gap 03/09/2014 14  5 - 15 Final  . WBC 03/09/2014 10.7* 4.0 - 10.5 K/uL Final  . RBC 03/09/2014 4.31  3.87 - 5.11 MIL/uL Final  . Hemoglobin 03/09/2014 12.9  12.0 - 15.0 g/dL Final  . HCT 03/09/2014 37.6  36.0 - 46.0 % Final  . MCV 03/09/2014 87.2  78.0 - 100.0 fL Final  . MCH 03/09/2014 29.9  26.0 - 34.0 pg Final  . MCHC 03/09/2014 34.3  30.0 - 36.0 g/dL Final  . RDW 03/09/2014 12.6  11.5 - 15.5 % Final  . Platelets 03/09/2014 207  150 - 400 K/uL Final  . Vitamin B-12 03/09/2014 431  211 - 911 pg/mL Final   Performed at [facility]  . RBC Folate 03/09/2014 622* >280 ng/mL Final   Comment: Reference range not established for pediatric patients.                          Performed at [facility] on 01/23/2014  Component Date Value Ref Range Status  . Hemoglobin A1C 01/16/2014 6.5* 4.0 - 6.0 % Final  . TSH  01/16/2014 2.47  0.41 - 5.90 uIU/mL Final  Lab on 01/16/2014  Component Date Value Ref Range Status  . Hemoglobin A1C 01/09/2014 6.5* 4.0 - 6.0 % Final  Clinical Support on 12/30/2013  Component Date Value Ref Range Status  . Date Time Interrogation Session 12/31/2013 [id number]   Final  . Pulse Generator Manufacturer 12/31/2013 Medtronic   Final  . Pulse Gen Model 12/31/2013 [id number] EnPulse   Final  . Pulse Gen Serial Number 12/31/2013 [id number]   Final  . RV Sense Sensitivity 12/31/2013 4   Final  . RA Pace Amplitude 12/31/2013 1.5   Final  . RV Pace PulseWidth 12/31/2013 0.4   Final  . RV Pace Amplitude 12/31/2013 2   Final  . RA Impedance 12/31/2013 503   Final  . RA Pacing Amplitude 12/31/2013 0.375   Final  . RA Pacing PulseWidth 12/31/2013 0.4   Final  . RV IMPEDANCE 12/31/2013 754   Final  . RV Amplitude 12/31/2013 8.0   Final  . RV Pacing Amplitude 12/31/2013 0.625   Final  . RV Pacing PulseWidth 12/31/2013 0.4   Final  . Battery Status 12/31/2013 Unknown   Final  . Battery Longevity 12/31/2013 23   Final  . Battery Voltage 12/31/2013 2.73   Final  . Battery Impedance 12/31/2013 2346   Final  . Loletha Grayer AP VP Percent 12/31/2013 0   Final  . Loletha Grayer AS VP Percent 12/31/2013 0   Final  . Loletha Grayer AP VS Percent 12/31/2013 93   Final  . Brady AS VS Percent 12/31/2013 7   Final  . Eval Rhythm 12/31/2013 Ap/Vs w/ 1st degree block   Final  . Miscellaneous Comment 12/31/2013    Final                   Value:Pacemaker remote check. Device function reviewed. Impedance, sensing, auto capture thresholds consistent with previous measurements. Histograms appropriate for  patient and level of activity. All other diagnostic data reviewed and is appropriate and                          stable for patient. Real time/magnet EGM shows appropriate sensing and capture. No mode switch or ventricular high rate episodes. Estimated longevity 4month. [facility]link 04/02/14 & ROV w/ SK 2/16.       Assessment/Plan 1. Fracture of greater trochanter of left femur, closed, initial encounter Her for PT and OT to strengthen, establish safe mobility, protect from further falls during the time of pain in the left hip.  2. Fall, initial encounter Known risk for further falls  3. Dementia, without behavioral disturbance Contributes to impulsive behaviors that increase her risk for falls  4. Chronic atrial fibrillation Stable and rate controlled  5. DM controlled  6. HYPERTENSION controlled  7. Pacemaker-dual  medtronic functioning  8. Unspecified hypothyroidism compensated

## 2014-03-14 DIAGNOSIS — S72102A Unspecified trochanteric fracture of left femur, initial encounter for closed fracture: Secondary | ICD-10-CM

## 2014-03-14 DIAGNOSIS — K5900 Constipation, unspecified: Secondary | ICD-10-CM | POA: Insufficient documentation

## 2014-03-14 HISTORY — DX: Constipation, unspecified: K59.00

## 2014-03-14 HISTORY — DX: Unspecified trochanteric fracture of left femur, initial encounter for closed fracture: S72.102A

## 2014-03-14 NOTE — Assessment & Plan Note (Signed)
CT scan did however reveal acute fracture in greater left trochanteric. Dr. Timothy Murphy consulted. Recommended nonoperative management and weightbearing as tolerated and another 3 weeks of Lovenox for DVT prophylaxis.    

## 2014-03-14 NOTE — Assessment & Plan Note (Signed)
Takes Levothyroxine 12.445mcg, 01/16/14 TSH 2.474

## 2014-03-14 NOTE — Assessment & Plan Note (Signed)
Stable, continue MiraLax daily.  

## 2014-03-14 NOTE — Assessment & Plan Note (Signed)
Confused time, place, person-more anxious and requires more assistance with ADLs prior to SNF. Progression of memory lapses, impaired vision and hearing, increased frailty all contribute to her mood and decline in her ADL function. Will continue to monitor weight-daily, food intake, and mood. Will obtain Memory Test. Staff reported patient made statement-just want to die x1 and refusing to eat very much.

## 2014-03-14 NOTE — Assessment & Plan Note (Signed)
Better with Oxybutynin 5mg daily.    

## 2014-03-14 NOTE — Assessment & Plan Note (Signed)
Controlled on Metoprolol 50mg daily and Amlodipine 10mg daily  

## 2014-03-14 NOTE — Assessment & Plan Note (Signed)
takes Metformin 500mg  bid, CBG ac breakfast: 118, 128, 129 [facility]

## 2014-03-21 ENCOUNTER — Non-Acute Institutional Stay (SKILLED_NURSING_FACILITY): Payer: 59 | Admitting: Nurse Practitioner

## 2014-03-21 ENCOUNTER — Encounter: Payer: Self-pay | Admitting: Nurse Practitioner

## 2014-03-21 DIAGNOSIS — F0390 Unspecified dementia without behavioral disturbance: Secondary | ICD-10-CM

## 2014-03-21 DIAGNOSIS — I480 Paroxysmal atrial fibrillation: Secondary | ICD-10-CM

## 2014-03-21 DIAGNOSIS — E119 Type 2 diabetes mellitus without complications: Secondary | ICD-10-CM

## 2014-03-21 DIAGNOSIS — N318 Other neuromuscular dysfunction of bladder: Secondary | ICD-10-CM

## 2014-03-21 DIAGNOSIS — I4891 Unspecified atrial fibrillation: Secondary | ICD-10-CM

## 2014-03-21 DIAGNOSIS — I10 Essential (primary) hypertension: Secondary | ICD-10-CM

## 2014-03-21 DIAGNOSIS — E039 Hypothyroidism, unspecified: Secondary | ICD-10-CM

## 2014-03-21 DIAGNOSIS — S72009S Fracture of unspecified part of neck of unspecified femur, sequela: Secondary | ICD-10-CM

## 2014-03-21 DIAGNOSIS — S72102S Unspecified trochanteric fracture of left femur, sequela: Secondary | ICD-10-CM

## 2014-03-21 DIAGNOSIS — K5900 Constipation, unspecified: Secondary | ICD-10-CM

## 2014-03-21 NOTE — Assessment & Plan Note (Addendum)
takes Metformin 500mg  bid, CBG ac breakfast: 118, 128, 129 [facility]. Update Hgb A1c

## 2014-03-21 NOTE — Assessment & Plan Note (Signed)
Confused time, place, person-more anxious and requires more assistance with ADLs prior to SNF-better due to initial difficulty of adjusting. Progression of memory lapses, impaired vision and hearing, increased frailty all contribute to her mood and decline in her ADL function. Will continue to monitor weight-daily, food intake, and mood. Staff reported patient made statement-just want to die x1-isolated episode. SLUMS 11/30(1-20 dementia). May consider Memory preserving med.

## 2014-03-21 NOTE — Assessment & Plan Note (Addendum)
Takes Levothyroxine 12.775mcg, 01/16/14 TSH 2.474. Update TSH

## 2014-03-21 NOTE — Assessment & Plan Note (Signed)
Rate controlled 

## 2014-03-21 NOTE — Assessment & Plan Note (Signed)
Stable, continue MiraLax daily.  

## 2014-03-21 NOTE — Assessment & Plan Note (Signed)
Better with Oxybutynin 5mg daily.    

## 2014-03-21 NOTE — Assessment & Plan Note (Addendum)
Mid elevated Sbp. takes Metoprolol 50mg  daily and Amlodipine 10mg  daily. Update CMP

## 2014-03-21 NOTE — Progress Notes (Signed)
Patient ID: [name], female   DOB: [birth year], 80 y.o.   MRN: [id number]   Code Status: DNR  Chief Complaint:  Chief Complaint  Patient presents with  . Medical Management of Chronic Issues     HPI: seen today in the SNF  @ [facility] for evaluation of Fracture of greater trochanter of left femur and chronic condition   Problem List Items Addressed This Visit   ATRIAL FIBRILLATION     Rate controlled.      Dementia     Confused time, place, person-more anxious and requires more assistance with ADLs prior to SNF-better due to initial difficulty of adjusting. Progression of memory lapses, impaired vision and hearing, increased frailty all contribute to her mood and decline in her ADL function. Will continue to monitor weight-daily, food intake, and mood. Staff reported patient made statement-just want to die x1-isolated episode. SLUMS 11/30(1-20 dementia). May consider Memory preserving med.        DM     takes Metformin 500mg  bid, CBG ac breakfast: 118, 128, 129 since admitted to SNF [facility]. Update Hgb A1c     HYPERTENSION - Primary     Mid elevated Sbp. takes Metoprolol 50mg  daily and Amlodipine 10mg  daily. Update CMP       Hypertonicity of bladder     Better with Oxybutynin 5mg  daily.       Trochanteric fracture of left femur     CT scan did however reveal acute fracture in greater left trochanteric. Dr. Margarita Rana consulted. Recommended nonoperative management and weightbearing as tolerated and another 3 weeks of Lovenox for DVT prophylaxis. Discharge plan: functional test for level of care. Check CBC      Unspecified constipation     Stable, continue MiraLax daily.      Unspecified hypothyroidism     Takes Levothyroxine 12.3mcg, 01/16/14 TSH 2.474. Update TSH        Review of Systems:  Review of Systems  Constitutional: Positive for malaise/fatigue. Negative for fever, chills, weight loss and diaphoresis.  HENT: Positive for hearing loss (severe with  hearing aids. ). Negative for congestion, ear discharge, ear pain, nosebleeds, sore throat and tinnitus.   Eyes: Positive for blurred vision (macular degeneration ). Negative for pain, discharge and redness.  Respiratory: Negative for cough, hemoptysis, sputum production, shortness of breath, wheezing and stridor.   Cardiovascular: Negative for chest pain, palpitations, orthopnea, claudication, leg swelling and PND.  Gastrointestinal: Negative for heartburn, nausea, vomiting, abdominal pain, diarrhea, constipation, blood in stool and melena.  Genitourinary: Positive for frequency (chronic). Negative for dysuria, urgency, hematuria and flank pain.  Musculoskeletal: Positive for falls and joint pain. Negative for back pain, myalgias and neck pain.       Left hip pain with ROM and weight bearing.   Skin: Negative for itching and rash.  Neurological: Negative for dizziness, tingling, tremors, sensory change, speech change, focal weakness, seizures, loss of consciousness, weakness and headaches.  Endo/Heme/Allergies: Negative for environmental allergies and polydipsia. Does not bruise/bleed easily.  Psychiatric/Behavioral: Positive for depression and memory loss. Negative for hallucinations. The patient is not nervous/anxious and does not have insomnia.      Medications: Patient's Medications  New Prescriptions   No medications on file  Previous Medications   ACETAMINOPHEN (TYLENOL) 325 MG TABLET    Take 650 mg by mouth every 4 (four) hours as needed for mild pain or moderate pain.    AMLODIPINE (NORVASC) 10 MG TABLET    Take 10 mg by  mouth Daily.   BISACODYL (DULCOLAX) 10 MG SUPPOSITORY    Place 1 suppository (10 mg total) rectally daily as needed for moderate constipation.   CALCIUM-VITAMIN D (OSCAL WITH D) 500-200 MG-UNIT PER TABLET    Take 1 tablet by mouth 2 (two) times daily.   ENOXAPARIN (LOVENOX) 40 MG/0.4ML INJECTION    Inject 0.4 mLs (40 mg total) into the skin daily. For 3 weeks    HYDROCODONE-ACETAMINOPHEN (NORCO/VICODIN) 5-325 MG PER TABLET    Take 1-2 tablets by mouth every 6 (six) hours as needed for moderate pain.   LECITHIN 1200 MG CAPS    Take 1,200 mg by mouth daily.    LEVOTHYROXINE (SYNTHROID, LEVOTHROID) 25 MCG TABLET    Take 12.5 mg by mouth Daily.   MAGNESIUM HYDROXIDE (MILK OF MAGNESIA) 400 MG/5ML SUSPENSION    Take 30 mLs by mouth every other day. Every other day as needed for constipation   METFORMIN (GLUCOPHAGE) 500 MG TABLET    2 (two) times daily.   METOPROLOL SUCCINATE (TOPROL-XL) 50 MG 24 HR TABLET    Take 50 mg by mouth Daily.   MULTIPLE VITAMINS-MINERALS (PRESERVISION AREDS PO)    Take 1 tablet by mouth 2 (two) times daily.    OXYBUTYNIN (DITROPAN-XL) 5 MG 24 HR TABLET    Take 5 mg by mouth Daily.   POLYETHYLENE GLYCOL POWDER (GLYCOLAX/MIRALAX) POWDER    Take 17 g by mouth daily.   SIMVASTATIN (ZOCOR) 10 MG TABLET    Take 10 mg by mouth Daily.   SODIUM PHOSPHATE (FLEET) 7-19 GM/118ML ENEM    Place 133 mLs (1 enema total) rectally daily as needed for severe constipation.  Modified Medications   No medications on file  Discontinued Medications   No medications on file     Physical Exam: Physical Exam  Constitutional: She is oriented to person, place, and time. She appears well-developed and well-nourished.  HENT:  Head: Normocephalic and atraumatic.  Right Ear: External ear normal.  Left Ear: External ear normal.  Nose: Nose normal.  Mouth/Throat: Oropharynx is clear and moist. No oropharyngeal exudate.  Eyes: Conjunctivae and EOM are normal. Pupils are equal, round, and reactive to light. Right eye exhibits no discharge. Left eye exhibits no discharge. No scleral icterus.  Neck: Normal range of motion. Neck supple. No JVD present. No thyromegaly present.  Cardiovascular: Normal rate.   Murmur heard. Pacemaker. Systolic ejection murmur 3/6  Pulmonary/Chest: Effort normal and breath sounds normal. No respiratory distress. She has no wheezes.  She has no rales. She exhibits no tenderness.  Abdominal: Soft. Bowel sounds are normal. She exhibits no distension. There is no tenderness. There is no rebound.  Musculoskeletal: Normal range of motion. She exhibits no edema and no tenderness.  Lymphadenopathy:    She has no cervical adenopathy.  Neurological: She is alert and oriented to person, place, and time. She has normal reflexes. No cranial nerve deficit. She exhibits normal muscle tone. Coordination normal.  Skin: Skin is warm and dry. No rash noted. She is not diaphoretic. No erythema. No pallor.  Psychiatric: Thought content normal. Her mood appears not anxious. Her affect is not angry, not blunt, not labile and not inappropriate. Her speech is not rapid and/or pressured, not delayed and not slurred. She is slowed. She is not agitated, not aggressive, not hyperactive, not withdrawn, not actively hallucinating and not combative. Thought content is not paranoid and not delusional. Cognition and memory are impaired. She expresses impulsivity and inappropriate judgment. She exhibits a  depressed mood. She exhibits abnormal recent memory.  Flat affect. Confusion-going to bed before dinner frequently.  She is attentive.     Filed Vitals:   03/21/14 1058  BP: 154/64  Pulse: 60  Temp: 97.6 F (36.4 C)  TempSrc: Tympanic  Resp: 16      Labs reviewed: Basic Metabolic Panel:  Recent Labs  29/52/8401/27/15 01/16/14 03/08/14 0849 03/09/14 0425  NA 141  --  138 141  K 3.9  --  3.9 3.9  CL  --   --  100 103  CO2  --   --  27 24  GLUCOSE  --   --  152* 110*  BUN 18  --  17 19  CREATININE 0.9  --  0.84 0.75  CALCIUM  --   --  9.8 9.1  TSH 2.50 2.47  --   --     Liver Function Tests:  Recent Labs  08/27/13  AST 37*  ALT 23  ALKPHOS 82    CBC:  Recent Labs  08/27/13 03/08/14 0849 03/09/14 0425  WBC 8.3 11.8* 10.7*  HGB 15.2 13.6 12.9  HCT 44 39.6 37.6  MCV  --  86.1 87.2  PLT 296 206 207   Past Procedure:  03/06/14  X-ray pelvis([facility]) no acute osseous abnormality. Chronic degenerative and postsurgical findings(s/p R hip arthroplasty and left femoral neck compression screw with associated proximal femoral sideplate and fixation screws-no hardware loosening or migration visible at imaged components. R inferior ischial and inferior right pubic ramus osseous deformities consistent with previously identified remote fracture injuries)   03/08/14 X-ray lumbar spine:  IMPRESSION: 1. Chronic wedge compression fractures. 2.  No evidence for acute  abnormality.  03/08/14 CXR  IMPRESSION: Low lung volumes.  No active disease.  03/08/14 X-ray pelvis  IMPRESSION: 1. Postoperative changes. 2.  No evidence for acute  abnormality.  03/08/14 CT head w/o contrast:  IMPRESSION: 1. Atrophy and small vessel disease. 2.  No evidence for acute intracranial abnormality. 3. Right posterior parietal scalp hematoma without underlying calvarial fracture. 4. Sinusitis involving the right sphenoid air cell. No evidence for sinus wall fracture   03/08/14 CT pelvis and abd w/o contrast:  IMPRESSION: 1. Nondisplaced fracture along the left greater trochanter is new since prior exam. It is unclear if this is acute or chronic injury. 2. Postoperative changes of both hips. 3. Remote L5 compression fracture. 4. Extensive atherosclerotic changes including coronary artery disease. 5. Small hiatal hernia. 6. No acute intra-abdominal lesion to explain the patient's symptoms. Assessment/Plan Fracture of greater trochanter of left femur CT scan did however reveal acute fracture in greater left trochanteric. Dr. Margarita Ranaimothy Murphy consulted. Recommended nonoperative management and weightbearing as tolerated and another 3 weeks of Lovenox for DVT prophylaxis.     HYPERTENSION Mid elevated Sbp. takes Metoprolol 50mg  daily and Amlodipine 10mg  daily. Update CMP     Hypertonicity of bladder Better with Oxybutynin 5mg  daily.      Unspecified hypothyroidism Takes Levothyroxine 12.45mcg, 01/16/14 TSH 2.474. Update TSH   Unspecified constipation Stable, continue MiraLax daily.    DM takes Metformin 500mg  bid, CBG ac breakfast: 118, 128, 129 [facility]. Update Hgb A1c   ATRIAL FIBRILLATION Rate controlled.    Trochanteric fracture of left femur CT scan did however reveal acute fracture in greater left trochanteric. Dr. Margarita Ranaimothy Murphy consulted. Recommended nonoperative management and weightbearing as tolerated and another 3 weeks of Lovenox for DVT prophylaxis. Discharge plan: functional test for level of care.  Check CBC    Dementia Confused time, place, person-more anxious and requires more assistance with ADLs prior to SNF-better due to initial difficulty of adjusting. Progression of memory lapses, impaired vision and hearing, increased frailty all contribute to her mood and decline in her ADL function. Will continue to monitor weight-daily, food intake, and mood. Staff reported patient made statement-just want to die x1-isolated episode. SLUMS 11/30(1-20 dementia). May consider Memory preserving med.          Family/ staff Communication: safety   Goals of care: SNF-functional assessment for level of care 03/21/14 73(SNF 45+)   Labs/tests ordered: CBC, CMP, TSH, Hgb A1c

## 2014-03-21 NOTE — Assessment & Plan Note (Addendum)
CT scan did however reveal acute fracture in greater left trochanteric. Dr. Margarita Ranaimothy Murphy consulted. Recommended nonoperative management and weightbearing as tolerated and another 3 weeks of Lovenox for DVT prophylaxis. Discharge plan: functional test for level of care. Check CBC

## 2014-03-21 NOTE — Assessment & Plan Note (Signed)
CT scan did however reveal acute fracture in greater left trochanteric. Dr. Margarita Ranaimothy Murphy consulted. Recommended nonoperative management and weightbearing as tolerated and another 3 weeks of Lovenox for DVT prophylaxis.

## 2014-03-24 LAB — BASIC METABOLIC PANEL
BUN: 22 mg/dL — AB (ref 4–21)
Creatinine: 0.9 mg/dL (ref 0.5–1.1)
GLUCOSE: 108 mg/dL
Potassium: 4.2 mmol/L (ref 3.4–5.3)
Sodium: 140 mmol/L (ref 137–147)

## 2014-03-24 LAB — CBC AND DIFFERENTIAL
HEMATOCRIT: 40 % (ref 36–46)
HEMOGLOBIN: 14 g/dL (ref 12.0–16.0)
Platelets: 386 10*3/uL (ref 150–399)
WBC: 8.5 10^3/mL

## 2014-03-24 LAB — HEPATIC FUNCTION PANEL
ALT: 9 U/L (ref 7–35)
AST: 18 U/L (ref 13–35)
Alkaline Phosphatase: 198 U/L — AB (ref 25–125)
BILIRUBIN, TOTAL: 0.7 mg/dL

## 2014-03-24 LAB — HEMOGLOBIN A1C: Hgb A1c MFr Bld: 6.4 % — AB (ref 4.0–6.0)

## 2014-03-24 LAB — TSH: TSH: 2.58 u[IU]/mL (ref 0.41–5.90)

## 2014-03-25 ENCOUNTER — Other Ambulatory Visit: Payer: Self-pay | Admitting: Nurse Practitioner

## 2014-03-25 DIAGNOSIS — E039 Hypothyroidism, unspecified: Secondary | ICD-10-CM

## 2014-03-25 DIAGNOSIS — E119 Type 2 diabetes mellitus without complications: Secondary | ICD-10-CM

## 2014-04-02 ENCOUNTER — Telehealth: Payer: Self-pay | Admitting: Cardiology

## 2014-04-02 ENCOUNTER — Non-Acute Institutional Stay (SKILLED_NURSING_FACILITY): Payer: 59 | Admitting: Nurse Practitioner

## 2014-04-02 ENCOUNTER — Encounter: Payer: Self-pay | Admitting: Nurse Practitioner

## 2014-04-02 ENCOUNTER — Encounter: Payer: Medicare PPO | Admitting: *Deleted

## 2014-04-02 DIAGNOSIS — N318 Other neuromuscular dysfunction of bladder: Secondary | ICD-10-CM

## 2014-04-02 DIAGNOSIS — I480 Paroxysmal atrial fibrillation: Secondary | ICD-10-CM

## 2014-04-02 DIAGNOSIS — E039 Hypothyroidism, unspecified: Secondary | ICD-10-CM

## 2014-04-02 DIAGNOSIS — I4891 Unspecified atrial fibrillation: Secondary | ICD-10-CM

## 2014-04-02 DIAGNOSIS — K5900 Constipation, unspecified: Secondary | ICD-10-CM

## 2014-04-02 DIAGNOSIS — I10 Essential (primary) hypertension: Secondary | ICD-10-CM

## 2014-04-02 DIAGNOSIS — F0390 Unspecified dementia without behavioral disturbance: Secondary | ICD-10-CM

## 2014-04-02 DIAGNOSIS — E119 Type 2 diabetes mellitus without complications: Secondary | ICD-10-CM

## 2014-04-02 NOTE — Assessment & Plan Note (Signed)
Better with Oxybutynin 5mg daily.    

## 2014-04-02 NOTE — Assessment & Plan Note (Signed)
Stable, continue MiraLax daily.  

## 2014-04-02 NOTE — Assessment & Plan Note (Signed)
Mild elevated Sbp. takes Metoprolol 50mg daily and Amlodipine 10mg daily.    

## 2014-04-02 NOTE — Progress Notes (Signed)
Patient ID: [name]. Stalder, female   DOB: 06-Mar-1915, 80 y.o.   MRN: [id number]   Code Status: DNR  Allergies  Allergen Reactions  . Codeine     unknown  . Ibuprofen     unknown  . Lisinopril     unknown  . Penicillins     unknown  . Philis Nettle [Trospium Chloride]     unknown    Chief Complaint  Patient presents with  . Medical Management of Chronic Issues    HPI: Patient is a 80 y.o. female seen in the SNF at [facility] today for evaluation of chronic medical conditions.  Problem List Items Addressed This Visit   Unspecified hypothyroidism     Takes Levothyroxine 12.80mcg, 01/16/14 TSH 2.474. 03/24/14 TSH 2.576      Unspecified constipation     Stable, continue MiraLax daily.       Hypertonicity of bladder     Better with Oxybutynin  daily.       HYPERTENSION     Mild elevated Sbp. takes Metoprolol  daily and Amlodipine  daily.        DM     takes Metformin  bid, 01/16/14 Hgb A1c 6.5. 03/24/14 Hgb 6.4      Dementia - Primary     Confused time, place, person-more anxious and requires more assistance with ADLs prior to SN. Progression of memory lapses, impaired vision and hearing, increased frailty all contribute to her mood and decline in her ADL function. SLUMS 11/30(1-20 dementia). May consider Memory preserving med. Repeat MMSE     ATRIAL FIBRILLATION     Rate controlled.          Review of Systems:  Review of Systems  Constitutional: Positive for malaise/fatigue. Negative for fever, chills, weight loss and diaphoresis.  HENT: Positive for hearing loss (severe with hearing aids. ). Negative for congestion, ear discharge, ear pain, nosebleeds, sore throat and tinnitus.        Severe HOH R+L  Eyes: Positive for blurred vision (macular degeneration ). Negative for pain, discharge and redness.       Only sees objects.   Respiratory: Negative for cough, hemoptysis, sputum production, shortness of breath, wheezing and stridor.     Cardiovascular: Negative for chest pain, palpitations, orthopnea, claudication, leg swelling and PND.  Gastrointestinal: Negative for heartburn, nausea, vomiting, abdominal pain, diarrhea, constipation, blood in stool and melena.  Genitourinary: Positive for frequency (chronic). Negative for dysuria, urgency, hematuria and flank pain.  Musculoskeletal: Positive for falls and joint pain. Negative for back pain, myalgias and neck pain.       Left hip pain with ROM and weight bearing.   Skin: Negative for itching and rash.  Neurological: Negative for dizziness, tingling, tremors, sensory change, speech change, focal weakness, seizures, loss of consciousness, weakness and headaches.  Endo/Heme/Allergies: Negative for environmental allergies and polydipsia. Does not bruise/bleed easily.  Psychiatric/Behavioral: Positive for depression and memory loss. Negative for hallucinations. The patient is not nervous/anxious and does not have insomnia.      Past Medical History  Diagnosis Date  . HTN (hypertension)   . Other and unspecified hyperlipidemia   . DM (diabetes mellitus)   . Hyperthyroidism   . Hemorrhoids   . AF (atrial fibrillation)   . Rectal bleeding   . Sinus node dysfunction   . Hip fracture   . Bradycardia   . Aortic stenosis   . Mitral valve prolapse   . Hearing loss   . Memory  loss 02/16/2012  . Disturbance of salivary secretion [id number]  . Unspecified hereditary and idiopathic peripheral neuropathy 10/28/202012  . Closed fracture of unspecified trochanteric section of femur 05/29/2011  . Anxiety state, unspecified 08/19/2010  . Diffuse cystic mastopathy 12/23/2009  . Flatulence, eructation, and gas pain 12/03/2009  . Closed fracture of lumbar vertebra without mention of spinal cord injury 1November 05, 202010  . Personal history of fall 01/29/2009  . Occlusion and stenosis of carotid artery without mention of cerebral infarction 12/27/2007  . Syncope and collapse 12/27/2007  . Other malaise  and fatigue 12/06/2007  . Unspecified urinary incontinence 02/22/2005  . Cardiac pacemaker in situ 09/12/2004  . Unspecified hypothyroidism 03/18/2010  . Abnormality of gait 09/08/2003  . Edema 08/18/2003  . Macular degeneration (senile) of retina, unspecified 12/12/2001  . Undiagnosed cardiac murmurs 04/11/2001  . Senile osteoporosis 12/13/2000  . Legal blindness, as defined in [place] 03/20/1999    secondary to macular degeneration  . Fracture of greater trochanter of left femur 03/08/14   Past Surgical History  Procedure Laterality Date  . Pacemaker insertion  2006    [employer] In-Pulse  . Left hip surgery  09/2005  . Right hip surgery  08/2003  . 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 Tonsillectomy    . Breast biopsy Left 1970    benign   Social History:   reports that she has never smoked. She has never used smokeless tobacco. She reports that she does not drink alcohol or use illicit drugs.  Family History  Problem Relation Age of Onset  . Colon cancer Mother   . Cancer Mother     colon  . Heart attack Father   . Heart disease Father     MI  . Heart disease Sister     MI    Medications: Patient's Medications  New Prescriptions   No medications on file  Previous Medications   ACETAMINOPHEN (TYLENOL) 325 MG TABLET    Take 650 mg by mouth every 4 (four) hours as needed for mild pain or moderate pain.    AMLODIPINE (NORVASC) 10 MG TABLET    Take 10 mg by mouth Daily.   BISACODYL (DULCOLAX) 10 MG SUPPOSITORY    Place 1 suppository (10 mg total) rectally daily as needed for moderate constipation.   CALCIUM-VITAMIN D (OSCAL WITH D) 500-200 MG-UNIT PER TABLET    Take 1 tablet by mouth 2 (two) times daily.   ENOXAPARIN (LOVENOX) 40 MG/0.4ML INJECTION    Inject 0.4 mLs (40 mg total) into the skin daily. For 3 weeks   HYDROCODONE-ACETAMINOPHEN (NORCO/VICODIN) 5-325 MG PER TABLET    Take 1-2 tablets by mouth every 6 (six)  hours as needed for moderate pain.   LECITHIN 1200 MG CAPS    Take 1,200 mg by mouth daily.    LEVOTHYROXINE (SYNTHROID, LEVOTHROID) 25 MCG TABLET    Take 12.5 mg by mouth Daily.   MAGNESIUM HYDROXIDE (MILK OF MAGNESIA) 400 MG/5ML SUSPENSION    Take 30 mLs by mouth every other day. Every other day as needed for constipation   METFORMIN (GLUCOPHAGE) 500 MG TABLET    2 (two) times daily.   METOPROLOL SUCCINATE (TOPROL-XL) 50 MG 24 HR TABLET    Take 50 mg by mouth Daily.   MULTIPLE VITAMINS-MINERALS (PRESERVISION AREDS PO)    Take 1 tablet by mouth 2 (two) times daily.    OXYBUTYNIN (DITROPAN-XL) 5 MG 24 HR TABLET  Take 5 mg by mouth Daily.   POLYETHYLENE GLYCOL POWDER (GLYCOLAX/MIRALAX) POWDER    Take 17 g by mouth daily.   SIMVASTATIN (ZOCOR) 10 MG TABLET    Take 10 mg by mouth Daily.   SODIUM PHOSPHATE (FLEET) 7-19 GM/118ML ENEM    Place 133 mLs (1 enema total) rectally daily as needed for severe constipation.  Modified Medications   No medications on file  Discontinued Medications   No medications on file     Physical Exam: Physical Exam  Constitutional: She is oriented to person, place, and time. She appears well-developed and well-nourished.  HENT:  Head: Normocephalic and atraumatic.  Right Ear: External ear normal.  Left Ear: External ear normal.  Nose: Nose normal.  Mouth/Throat: Oropharynx is clear and moist. No oropharyngeal exudate.  Eyes: Conjunctivae and EOM are normal. Pupils are equal, round, and reactive to light. Right eye exhibits no discharge. Left eye exhibits no discharge. No scleral icterus.  Neck: Normal range of motion. Neck supple. No JVD present. No thyromegaly present.  Cardiovascular: Normal rate.   Murmur heard. Pacemaker. Systolic ejection murmur 3/6  Pulmonary/Chest: Effort normal and breath sounds normal. No respiratory distress. She has no wheezes. She has no rales. She exhibits no tenderness.  Abdominal: Soft. Bowel sounds are normal. She exhibits no  distension. There is no tenderness. There is no rebound.  Musculoskeletal: Normal range of motion. She exhibits no edema and no tenderness.  Lymphadenopathy:    She has no cervical adenopathy.  Neurological: She is alert and oriented to person, place, and time. She has normal reflexes. No cranial nerve deficit. She exhibits normal muscle tone. Coordination normal.  Skin: Skin is warm and dry. No rash noted. She is not diaphoretic. No erythema. No pallor.  Psychiatric: Thought content normal. Her mood appears not anxious. Her affect is not angry, not blunt, not labile and not inappropriate. Her speech is not rapid and/or pressured, not delayed and not slurred. She is slowed. She is not agitated, not aggressive, not hyperactive, not withdrawn, not actively hallucinating and not combative. Thought content is not paranoid and not delusional. Cognition and memory are impaired. She expresses impulsivity and inappropriate judgment. She exhibits a depressed mood. She exhibits abnormal recent memory.  Flat affect. Confusion-going to bed before dinner frequently.  She is attentive.    Filed Vitals:   04/02/14 1508  BP: 138/60  Pulse: 67  Temp: 97.4 F (36.3 C)  TempSrc: Tympanic  Resp: 18      Labs reviewed: Basic Metabolic Panel:  Recent Labs  16/10/96 01/16/14 03/08/14 0849 03/09/14 0425 03/24/14  NA 141  --  138 141 140  K 3.9  --  3.9 3.9 4.2  CL  --   --  100 103  --   CO2  --   --  27 24  --   GLUCOSE  --   --  152* 110*  --   BUN 18  --  17 19 22*  CREATININE 0.9  --  0.84 0.75 0.9  CALCIUM  --   --  9.8 9.1  --   TSH 2.50 2.47  --   --  2.58   Liver Function Tests:  Recent Labs  08/27/13 03/24/14  AST 37* 18  ALT 23 9  ALKPHOS 82 198*   No results found for this basename: LIPASE, AMYLASE,  in the last 8760 hours No results found for this basename: AMMONIA,  in the last 8760 hours CBC:  Recent Labs  03/08/14 0849 03/09/14 0425 03/24/14  WBC 11.8* 10.7* 8.5  HGB  13.6 12.9 14.0  HCT 39.6 37.6 40  MCV 86.1 87.2  --   PLT 206 207 386   Lipid Panel: No results found for this basename: CHOL, HDL, LDLCALC, TRIG, CHOLHDL, LDLDIRECT,  in the last 8760 hours  Past Procedures:  Past Procedure:  03/06/14 X-ray pelvis([facility]) no acute osseous abnormality. Chronic degenerative and postsurgical findings(s/p R hip arthroplasty and left femoral neck compression screw with associated proximal femoral sideplate and fixation screws-no hardware loosening or migration visible at imaged components. R inferior ischial and inferior right pubic ramus osseous deformities consistent with previously identified remote fracture injuries)   03/08/14 X-ray lumbar spine:  IMPRESSION: 1. Chronic wedge compression fractures. 2.  No evidence for acute  abnormality.  03/08/14 CXR  IMPRESSION: Low lung volumes.  No active disease.  03/08/14 X-ray pelvis  IMPRESSION: 1. Postoperative changes. 2.  No evidence for acute  abnormality.  03/08/14 CT head w/o contrast:  IMPRESSION: 1. Atrophy and small vessel disease. 2.  No evidence for acute intracranial abnormality. 3. Right posterior parietal scalp hematoma without underlying calvarial fracture. 4. Sinusitis involving the right sphenoid air cell. No evidence for sinus wall fracture   03/08/14 CT pelvis and abd w/o contrast:  IMPRESSION: 1. Nondisplaced fracture along the left greater trochanter is new since prior exam. It is unclear if this is acute or chronic injury. 2. Postoperative changes of both hips. 3. Remote L5 compression fracture. 4. Extensive atherosclerotic changes including coronary artery disease. 5. Small hiatal hernia. 6. No acute intra-abdominal lesion to explain the patient's symptoms.    Assessment/Plan HYPERTENSION Mild elevated Sbp. takes Metoprolol  daily and Amlodipine  daily.      Unspecified hypothyroidism Takes Levothyroxine 12.26mcg, 01/16/14 TSH 2.474. 03/24/14 TSH  2.576    DM takes Metformin  bid, 01/16/14 Hgb A1c 6.5. 03/24/14 Hgb 6.4    Hypertonicity of bladder Better with Oxybutynin  daily.     Unspecified constipation Stable, continue MiraLax daily.     Dementia Confused time, place, person-more anxious and requires more assistance with ADLs prior to SN. Progression of memory lapses, impaired vision and hearing, increased frailty all contribute to her mood and decline in her ADL function. SLUMS 11/30(1-20 dementia). May consider Memory preserving med. Repeat MMSE   ATRIAL FIBRILLATION Rate controlled.     Fracture of greater trochanter of left femur CT scan did however reveal acute fracture in greater left trochanteric. Dr. Margarita Rana consulted. Recommended nonoperative management and weightbearing as tolerated and another 3 weeks of Lovenox for DVT prophylaxis(stop date 04/01/14). Continue PT for ambulation.        Family/ Staff Communication: observe the patient  Goals of Care: SNF  Labs/tests ordered: none

## 2014-04-02 NOTE — Assessment & Plan Note (Signed)
Takes Levothyroxine 12.5mcg, 01/16/14 TSH 2.474. 03/24/14 TSH 2.576  

## 2014-04-02 NOTE — Assessment & Plan Note (Signed)
takes Metformin 500mg bid, 01/16/14 Hgb A1c 6.5. 03/24/14 Hgb 6.4  

## 2014-04-02 NOTE — Assessment & Plan Note (Addendum)
CT scan did however reveal acute fracture in greater left trochanteric. Dr. Margarita Rana consulted. Recommended nonoperative management and weightbearing as tolerated and another 3 weeks of Lovenox for DVT prophylaxis(stop date 04/01/14). Continue PT for ambulation.

## 2014-04-02 NOTE — Assessment & Plan Note (Signed)
Rate controlled 

## 2014-04-02 NOTE — Assessment & Plan Note (Signed)
Confused time, place, person-more anxious and requires more assistance with ADLs prior to SN. Progression of memory lapses, impaired vision and hearing, increased frailty all contribute to her mood and decline in her ADL function. SLUMS 11/30(1-20 dementia). May consider Memory preserving med. Repeat MMSE

## 2014-04-02 NOTE — Telephone Encounter (Signed)
LMOVM reminding pt to send remote transmission.   

## 2014-04-03 ENCOUNTER — Encounter: Payer: Self-pay | Admitting: Cardiology

## 2014-04-10 ENCOUNTER — Encounter: Payer: Self-pay | Admitting: Nurse Practitioner

## 2014-04-10 ENCOUNTER — Non-Acute Institutional Stay (SKILLED_NURSING_FACILITY): Payer: 59 | Admitting: Nurse Practitioner

## 2014-04-10 DIAGNOSIS — I10 Essential (primary) hypertension: Secondary | ICD-10-CM

## 2014-04-10 DIAGNOSIS — E119 Type 2 diabetes mellitus without complications: Secondary | ICD-10-CM

## 2014-04-10 DIAGNOSIS — I482 Chronic atrial fibrillation, unspecified: Secondary | ICD-10-CM

## 2014-04-10 DIAGNOSIS — F32A Depression, unspecified: Secondary | ICD-10-CM

## 2014-04-10 DIAGNOSIS — F329 Major depressive disorder, single episode, unspecified: Secondary | ICD-10-CM

## 2014-04-10 DIAGNOSIS — F0390 Unspecified dementia without behavioral disturbance: Secondary | ICD-10-CM

## 2014-04-10 DIAGNOSIS — N390 Urinary tract infection, site not specified: Secondary | ICD-10-CM

## 2014-04-10 DIAGNOSIS — I4891 Unspecified atrial fibrillation: Secondary | ICD-10-CM

## 2014-04-10 DIAGNOSIS — F3289 Other specified depressive episodes: Secondary | ICD-10-CM

## 2014-04-10 DIAGNOSIS — N318 Other neuromuscular dysfunction of bladder: Secondary | ICD-10-CM

## 2014-04-10 DIAGNOSIS — E039 Hypothyroidism, unspecified: Secondary | ICD-10-CM

## 2014-04-10 DIAGNOSIS — E113299 Type 2 diabetes mellitus with mild nonproliferative diabetic retinopathy without macular edema, unspecified eye: Secondary | ICD-10-CM | POA: Insufficient documentation

## 2014-04-10 DIAGNOSIS — F418 Other specified anxiety disorders: Secondary | ICD-10-CM | POA: Insufficient documentation

## 2014-04-10 HISTORY — DX: Depression, unspecified: F32.A

## 2014-04-10 HISTORY — DX: Urinary tract infection, site not specified: N39.0

## 2014-04-10 NOTE — Assessment & Plan Note (Signed)
Mild elevated Sbp. takes Metoprolol 50mg daily and Amlodipine 10mg daily.    

## 2014-04-10 NOTE — Assessment & Plan Note (Signed)
04/05/14 Urine culture C. Koseri >100,000c/ml, total 7 day course of Nitrofurantoin  bid since 04/05/14-observe the patient.

## 2014-04-10 NOTE — Assessment & Plan Note (Signed)
04/03/14 MMSE 22/30. May consider memory preserving meds if no better after adjusting to SNF and better managing mood.   

## 2014-04-10 NOTE — Assessment & Plan Note (Signed)
takes Metformin 500mg bid, 01/16/14 Hgb A1c 6.5. 03/24/14 Hgb 6.4  

## 2014-04-10 NOTE — Assessment & Plan Note (Signed)
Rate controlled 

## 2014-04-10 NOTE — Assessment & Plan Note (Addendum)
04/05/14 her appetite was reportedly poor-weights #116Ibs 04/02/14 and # 117Ibds 04/09/14. Will add Mirtazapine 7.5 mg since the patient c/o feeling depressed per SW 04/08/14( feeling depressed, loss of interests, restlessness, and feeling like she just wants out of here. Noted flat affect. Said she felt she soul be better off dead but would not hurt herself)

## 2014-04-10 NOTE — Assessment & Plan Note (Signed)
Takes Levothyroxine 12.5mcg, 01/16/14 TSH 2.474. 03/24/14 TSH 2.576  

## 2014-04-10 NOTE — Progress Notes (Signed)
Patient ID: [name]. Opfer, female   DOB: 01-14-1915, 80 y.o.   MRN: [id number]   Code Status: DNR  Allergies  Allergen Reactions  . Codeine     unknown  . Ibuprofen     unknown  . Lisinopril     unknown  . Penicillins     unknown  . Philis Nettle [Trospium Chloride]     unknown    Chief Complaint  Patient presents with  . Medical Management of Chronic Issues  . Acute Visit    depression, UTI, memory    HPI: Patient is a 80 y.o. female seen in the SNF at [facility] today for evaluation of UTI, depression, dementia, and chronic medical conditions.  Problem List Items Addressed This Visit   Unspecified hypothyroidism     Takes Levothyroxine 12.32mcg, 01/16/14 TSH 2.474. 03/24/14 TSH 2.576     DM     takes Metformin  bid, 01/16/14 Hgb A1c 6.5. 03/24/14 Hgb 6.4      HYPERTENSION     Mild elevated Sbp. takes Metoprolol  daily and Amlodipine  daily.      ATRIAL FIBRILLATION     Rate controlled.      Hypertonicity of bladder     Better with Oxybutynin  daily.      Dementia - Primary     04/03/14 MMSE 22/30. May consider memory preserving meds if no better after adjusting to SNF and better managing mood.      Relevant Medications      mirtazapine (REMERON) 7.5 MG tablet   Depression     04/05/14 her appetite was reportedly poor-weights #116Ibs 04/02/14 and # 117Ibds 04/09/14. Will add Mirtazapine 7.5 mg since the patient c/o feeling depressed per SW 04/08/14( feeling depressed, loss of interests, restlessness, and feeling like she just wants out of here. Noted flat affect. Said she felt she soul be better off dead but would not hurt herself)    Relevant Medications      mirtazapine (REMERON) 7.5 MG tablet   Urinary tract infection, site not specified     04/05/14 Urine culture C. Koseri >100,000c/ml, total 7 day course of Nitrofurantoin  bid since 04/05/14-observe the patient.        Review of Systems:  Review of Systems  Constitutional: Positive for  malaise/fatigue. Negative for fever, chills, weight loss and diaphoresis.  HENT: Positive for hearing loss (severe with hearing aids. ). Negative for congestion, ear discharge, ear pain, nosebleeds, sore throat and tinnitus.        Severe HOH R+L  Eyes: Positive for blurred vision (macular degeneration ). Negative for pain, discharge and redness.       Only sees objects.   Respiratory: Negative for cough, hemoptysis, sputum production, shortness of breath, wheezing and stridor.   Cardiovascular: Negative for chest pain, palpitations, orthopnea, claudication, leg swelling and PND.  Gastrointestinal: Negative for heartburn, nausea, vomiting, abdominal pain, diarrhea, constipation, blood in stool and melena.  Genitourinary: Positive for frequency (chronic). Negative for dysuria, urgency, hematuria and flank pain.  Musculoskeletal: Positive for falls and joint pain. Negative for back pain, myalgias and neck pain.       Left hip pain with ROM and weight bearing.   Skin: Negative for itching and rash.  Neurological: Negative for dizziness, tingling, tremors, sensory change, speech change, focal weakness, seizures, loss of consciousness, weakness and headaches.  Endo/Heme/Allergies: Negative for environmental allergies and polydipsia. Does not bruise/bleed easily.  Psychiatric/Behavioral: Positive for depression and memory loss. Negative for  hallucinations. The patient is not nervous/anxious and does not have insomnia.      Past Medical History  Diagnosis Date  . HTN (hypertension)   . Other and unspecified hyperlipidemia   . DM (diabetes mellitus)   . Hyperthyroidism   . Hemorrhoids   . AF (atrial fibrillation)   . Rectal bleeding   . Sinus node dysfunction   . Hip fracture   . Bradycardia   . Aortic stenosis   . Mitral valve prolapse   . Hearing loss   . Memory loss 02/16/2012  . Disturbance of salivary secretion [id number]  . Unspecified hereditary and idiopathic peripheral neuropathy  10/28/202012  . Closed fracture of unspecified trochanteric section of femur 05/29/2011  . Anxiety state, unspecified 08/19/2010  . Diffuse cystic mastopathy 12/23/2009  . Flatulence, eructation, and gas pain 12/03/2009  . Closed fracture of lumbar vertebra without mention of spinal cord injury 12020/09/1708  . Personal history of fall 01/29/2009  . Occlusion and stenosis of carotid artery without mention of cerebral infarction 12/27/2007  . Syncope and collapse 12/27/2007  . Other malaise and fatigue 12/06/2007  . Unspecified urinary incontinence 02/22/2005  . Cardiac pacemaker in situ 09/12/2004  . Unspecified hypothyroidism 03/18/2010  . Abnormality of gait 09/08/2003  . Edema 08/18/2003  . Macular degeneration (senile) of retina, unspecified 12/12/2001  . Undiagnosed cardiac murmurs 04/11/2001  . Senile osteoporosis 12/13/2000  . Legal blindness, as defined in [place] 03/20/1999    secondary to macular degeneration  . Fracture of greater trochanter of left femur 03/08/14   Past Surgical History  Procedure Laterality Date  . Pacemaker insertion  2006    [employer] In-Pulse  . Left hip surgery  09/2005  . Right hip surgery  08/2003  . 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 Tonsillectomy    . Breast biopsy Left 1970    benign   Social History:   reports that she has never smoked. She has never used smokeless tobacco. She reports that she does not drink alcohol or use illicit drugs.  Family History  Problem Relation Age of Onset  . Colon cancer Mother   . Cancer Mother     colon  . Heart attack Father   . Heart disease Father     MI  . Heart disease Sister     MI    Medications: Patient's Medications  New Prescriptions   No medications on file  Previous Medications   ACETAMINOPHEN (TYLENOL) 325 MG TABLET    Take 650 mg by mouth every 4 (four) hours as needed for mild pain or moderate pain.    AMLODIPINE (NORVASC) 10 MG  TABLET    Take 10 mg by mouth Daily.   BISACODYL (DULCOLAX) 10 MG SUPPOSITORY    Place 1 suppository (10 mg total) rectally daily as needed for moderate constipation.   CALCIUM-VITAMIN D (OSCAL WITH D) 500-200 MG-UNIT PER TABLET    Take 1 tablet by mouth 2 (two) times daily.   ENOXAPARIN (LOVENOX) 40 MG/0.4ML INJECTION    Inject 0.4 mLs (40 mg total) into the skin daily. For 3 weeks   HYDROCODONE-ACETAMINOPHEN (NORCO/VICODIN) 5-325 MG PER TABLET    Take 1-2 tablets by mouth every 6 (six) hours as needed for moderate pain.   LECITHIN 1200 MG CAPS    Take 1,200 mg by mouth daily.    LEVOTHYROXINE (SYNTHROID, LEVOTHROID) 25 MCG TABLET    Take 12.5 mg  by mouth Daily.   MAGNESIUM HYDROXIDE (MILK OF MAGNESIA) 400 MG/5ML SUSPENSION    Take 30 mLs by mouth every other day. Every other day as needed for constipation   METFORMIN (GLUCOPHAGE) 500 MG TABLET    2 (two) times daily.   METOPROLOL SUCCINATE (TOPROL-XL) 50 MG 24 HR TABLET    Take 50 mg by mouth Daily.   MIRTAZAPINE (REMERON) 7.5 MG TABLET    Take 7.5 mg by mouth at bedtime.   MULTIPLE VITAMINS-MINERALS (PRESERVISION AREDS PO)    Take 1 tablet by mouth 2 (two) times daily.    OXYBUTYNIN (DITROPAN-XL) 5 MG 24 HR TABLET    Take 5 mg by mouth Daily.   POLYETHYLENE GLYCOL POWDER (GLYCOLAX/MIRALAX) POWDER    Take 17 g by mouth daily.   SIMVASTATIN (ZOCOR) 10 MG TABLET    Take 10 mg by mouth Daily.   SODIUM PHOSPHATE (FLEET) 7-19 GM/118ML ENEM    Place 133 mLs (1 enema total) rectally daily as needed for severe constipation.  Modified Medications   No medications on file  Discontinued Medications   No medications on file     Physical Exam: Physical Exam  Constitutional: She is oriented to person, place, and time. She appears well-developed and well-nourished.  HENT:  Head: Normocephalic and atraumatic.  Right Ear: External ear normal.  Left Ear: External ear normal.  Nose: Nose normal.  Mouth/Throat: Oropharynx is clear and moist. No  oropharyngeal exudate.  Eyes: Conjunctivae and EOM are normal. Pupils are equal, round, and reactive to light. Right eye exhibits no discharge. Left eye exhibits no discharge. No scleral icterus.  Neck: Normal range of motion. Neck supple. No JVD present. No thyromegaly present.  Cardiovascular: Normal rate.   Murmur heard. Pacemaker. Systolic ejection murmur 3/6  Pulmonary/Chest: Effort normal and breath sounds normal. No respiratory distress. She has no wheezes. She has no rales. She exhibits no tenderness.  Abdominal: Soft. Bowel sounds are normal. She exhibits no distension. There is no tenderness. There is no rebound.  Musculoskeletal: Normal range of motion. She exhibits no edema and no tenderness.  Lymphadenopathy:    She has no cervical adenopathy.  Neurological: She is alert and oriented to person, place, and time. She has normal reflexes. No cranial nerve deficit. She exhibits normal muscle tone. Coordination normal.  Skin: Skin is warm and dry. No rash noted. She is not diaphoretic. No erythema. No pallor.  Psychiatric: Thought content normal. Her mood appears not anxious. Her affect is not angry, not blunt, not labile and not inappropriate. Her speech is not rapid and/or pressured, not delayed and not slurred. She is slowed. She is not agitated, not aggressive, not hyperactive, not withdrawn, not actively hallucinating and not combative. Thought content is not paranoid and not delusional. Cognition and memory are impaired. She expresses impulsivity and inappropriate judgment. She exhibits a depressed mood. She exhibits abnormal recent memory.  Flat affect. Confusion-going to bed before dinner frequently.  She is attentive.    Filed Vitals:   04/10/14 1129  BP: 148/56  Pulse: 60  Temp: 97.6 F (36.4 C)  TempSrc: Tympanic  Resp: 20      Labs reviewed: Basic Metabolic Panel:  Recent Labs  21/30/86 01/16/14 03/08/14 0849 03/09/14 0425 03/24/14  NA 141  --  138 141 140  K  3.9  --  3.9 3.9 4.2  CL  --   --  100 103  --   CO2  --   --  27 24  --  GLUCOSE  --   --  152* 110*  --   BUN 18  --  17 19 22*  CREATININE 0.9  --  0.84 0.75 0.9  CALCIUM  --   --  9.8 9.1  --   TSH 2.50 2.47  --   --  2.58   Liver Function Tests:  Recent Labs  08/27/13 03/24/14  AST 37* 18  ALT 23 9  ALKPHOS 82 198*   No results found for this basename: LIPASE, AMYLASE,  in the last 8760 hours No results found for this basename: AMMONIA,  in the last 8760 hours CBC:  Recent Labs  03/08/14 0849 03/09/14 0425 03/24/14  WBC 11.8* 10.7* 8.5  HGB 13.6 12.9 14.0  HCT 39.6 37.6 40  MCV 86.1 87.2  --   PLT 206 207 386   Lipid Panel: No results found for this basename: CHOL, HDL, LDLCALC, TRIG, CHOLHDL, LDLDIRECT,  in the last 8760 hours  Past Procedures:  Past Procedure:  03/06/14 X-ray pelvis([facility]) no acute osseous abnormality. Chronic degenerative and postsurgical findings(s/p R hip arthroplasty and left femoral neck compression screw with associated proximal femoral sideplate and fixation screws-no hardware loosening or migration visible at imaged components. R inferior ischial and inferior right pubic ramus osseous deformities consistent with previously identified remote fracture injuries)   03/08/14 X-ray lumbar spine:  IMPRESSION: 1. Chronic wedge compression fractures. 2.  No evidence for acute  abnormality.  03/08/14 CXR  IMPRESSION: Low lung volumes.  No active disease.  03/08/14 X-ray pelvis  IMPRESSION: 1. Postoperative changes. 2.  No evidence for acute  abnormality.  03/08/14 CT head w/o contrast:  IMPRESSION: 1. Atrophy and small vessel disease. 2.  No evidence for acute intracranial abnormality. 3. Right posterior parietal scalp hematoma without underlying calvarial fracture. 4. Sinusitis involving the right sphenoid air cell. No evidence for sinus wall fracture   03/08/14 CT pelvis and abd w/o contrast:  IMPRESSION: 1. Nondisplaced fracture along  the left greater trochanter is new since prior exam. It is unclear if this is acute or chronic injury. 2. Postoperative changes of both hips. 3. Remote L5 compression fracture. 4. Extensive atherosclerotic changes including coronary artery disease. 5. Small hiatal hernia. 6. No acute intra-abdominal lesion to explain the patient's symptoms.    Assessment/Plan Dementia 04/03/14 MMSE 22/30. May consider memory preserving meds if no better after adjusting to SNF and better managing mood.    Depression 04/05/14 her appetite was reportedly poor-weights #116Ibs 04/02/14 and # 117Ibds 04/09/14. Will add Mirtazapine 7.5 mg since the patient c/o feeling depressed per SW 04/08/14( feeling depressed, loss of interests, restlessness, and feeling like she just wants out of here. Noted flat affect. Said she felt she soul be better off dead but would not hurt herself)  Unspecified hypothyroidism Takes Levothyroxine 12.76mcg, 01/16/14 TSH 2.474. 03/24/14 TSH 2.576   DM takes Metformin  bid, 01/16/14 Hgb A1c 6.5. 03/24/14 Hgb 6.4    HYPERTENSION Mild elevated Sbp. takes Metoprolol  daily and Amlodipine  daily.    ATRIAL FIBRILLATION Rate controlled.    Hypertonicity of bladder Better with Oxybutynin  daily.    Urinary tract infection, site not specified 04/05/14 Urine culture C. Koseri >100,000c/ml, total 7 day course of Nitrofurantoin  bid since 04/05/14-observe the patient.     Family/ Staff Communication: observe the patient  Goals of Care: SNF  Labs/tests ordered: none

## 2014-04-10 NOTE — Assessment & Plan Note (Signed)
Better with Oxybutynin 5mg daily.    

## 2014-05-06 ENCOUNTER — Encounter: Payer: Self-pay | Admitting: *Deleted

## 2014-05-29 ENCOUNTER — Encounter: Payer: Self-pay | Admitting: Nurse Practitioner

## 2014-05-29 ENCOUNTER — Non-Acute Institutional Stay (SKILLED_NURSING_FACILITY): Payer: 59 | Admitting: Nurse Practitioner

## 2014-05-29 DIAGNOSIS — E11341 Type 2 diabetes mellitus with severe nonproliferative diabetic retinopathy with macular edema: Secondary | ICD-10-CM

## 2014-05-29 DIAGNOSIS — K5900 Constipation, unspecified: Secondary | ICD-10-CM

## 2014-05-29 DIAGNOSIS — N318 Other neuromuscular dysfunction of bladder: Secondary | ICD-10-CM

## 2014-05-29 DIAGNOSIS — E039 Hypothyroidism, unspecified: Secondary | ICD-10-CM

## 2014-05-29 DIAGNOSIS — H547 Unspecified visual loss: Principal | ICD-10-CM

## 2014-05-29 DIAGNOSIS — F329 Major depressive disorder, single episode, unspecified: Secondary | ICD-10-CM

## 2014-05-29 DIAGNOSIS — F32A Depression, unspecified: Secondary | ICD-10-CM

## 2014-05-29 DIAGNOSIS — H540 Blindness, both eyes: Secondary | ICD-10-CM

## 2014-05-29 DIAGNOSIS — E113419 Type 2 diabetes mellitus with severe nonproliferative diabetic retinopathy with macular edema, unspecified eye: Secondary | ICD-10-CM

## 2014-05-29 DIAGNOSIS — I10 Essential (primary) hypertension: Secondary | ICD-10-CM

## 2014-05-29 NOTE — Assessment & Plan Note (Signed)
04/05/14 her appetite was reportedly poor-weights #116Ibs 04/02/14 and # 117Ibds 04/09/14. Stabilized since  Mirtazapine 7.5 mg since the patient c/o feeling depressed per SW 04/08/14( feeling depressed, loss of interests, restlessness, and feeling like she just wants out of here. Noted flat affect. Said she felt she soul be better off dead but would not hurt herself)

## 2014-05-29 NOTE — Progress Notes (Signed)
Patient ID: [name]. Keplinger, female   DOB: 03-Sep-1914, 80 y.o.   MRN: [id number]   Code Status: DNR  Allergies  Allergen Reactions  . Codeine     unknown  . Ibuprofen     unknown  . Lisinopril     unknown  . Penicillins     unknown  . Philis Nettle [Trospium Chloride]     unknown    Chief Complaint  Patient presents with  . Medical Management of Chronic Issues    HPI: Patient is a 80 y.o. female seen in the SNF at [facility] today for evaluation of chronic medical conditions.  Problem List Items Addressed This Visit   Type 2 diabetes mellitus with blindness, with macular edema, with severe nonproliferative retinopathy - Primary     takes Metformin 500mg  bid, 01/16/14 Hgb A1c 6.5. 03/24/14 Hgb 6.4     Hypothyroidism     Takes Levothyroxine 12.36mcg, 01/16/14 TSH 2.474. 03/24/14 TSH 2.576     Hypertonicity of bladder     Better with Oxybutynin 5mg  daily.       Essential hypertension     Mild elevated Sbp. takes Metoprolol 50mg  daily and Amlodipine 10mg  daily.       Depression     04/05/14 her appetite was reportedly poor-weights #116Ibs 04/02/14 and # 117Ibds 04/09/14. Stabilized since  Mirtazapine 7.5 mg since the patient c/o feeling depressed per SW 04/08/14( feeling depressed, loss of interests, restlessness, and feeling like she just wants out of here. Noted flat affect. Said she felt she soul be better off dead but would not hurt herself)     Constipation     Stable, continue MiraLax daily. Dulcolax, Fleet, and MOM prn available to her.           Review of Systems:  Review of Systems  Constitutional: Negative for fever, chills, weight loss, malaise/fatigue and diaphoresis.  HENT: Positive for hearing loss (severe with hearing aids. ). Negative for congestion, ear discharge, ear pain, nosebleeds, sore throat and tinnitus.        Severe HOH R+L  Eyes: Positive for blurred vision (macular degeneration ). Negative for pain, discharge and redness.       Only sees  objects.   Respiratory: Negative for cough, hemoptysis, sputum production, shortness of breath, wheezing and stridor.   Cardiovascular: Negative for chest pain, palpitations, orthopnea, claudication, leg swelling and PND.  Gastrointestinal: Negative for heartburn, nausea, vomiting, abdominal pain, diarrhea, constipation, blood in stool and melena.  Genitourinary: Positive for frequency (chronic). Negative for dysuria, urgency, hematuria and flank pain.  Musculoskeletal: Positive for falls and joint pain. Negative for back pain, myalgias and neck pain.       Left hip pain with ROM and weight bearing.   Skin: Negative for itching and rash.  Neurological: Negative for dizziness, tingling, tremors, sensory change, speech change, focal weakness, seizures, loss of consciousness, weakness and headaches.  Endo/Heme/Allergies: Negative for environmental allergies and polydipsia. Does not bruise/bleed easily.  Psychiatric/Behavioral: Positive for depression and memory loss. Negative for hallucinations. The patient is not nervous/anxious and does not have insomnia.      Past Medical History  Diagnosis Date  . HTN (hypertension)   . Other and unspecified hyperlipidemia   . DM (diabetes mellitus)   . Hyperthyroidism   . Hemorrhoids   . AF (atrial fibrillation)   . Rectal bleeding   . Sinus node dysfunction   . Hip fracture   . Bradycardia   . Aortic stenosis   .  Mitral valve prolapse   . Hearing loss   . Memory loss 02/16/2012  . Disturbance of salivary secretion [id number]  . Unspecified hereditary and idiopathic peripheral neuropathy 10/28/202012  . Closed fracture of unspecified trochanteric section of femur 05/29/2011  . Anxiety state, unspecified 08/19/2010  . Diffuse cystic mastopathy 12/23/2009  . Flatulence, eructation, and gas pain 12/03/2009  . Closed fracture of lumbar vertebra without mention of spinal cord injury 1April 06, 202010  . Personal history of fall 01/29/2009  . Occlusion and stenosis  of carotid artery without mention of cerebral infarction 12/27/2007  . Syncope and collapse 12/27/2007  . Other malaise and fatigue 12/06/2007  . Unspecified urinary incontinence 02/22/2005  . Cardiac pacemaker in situ 09/12/2004  . Unspecified hypothyroidism 03/18/2010  . Abnormality of gait 09/08/2003  . Edema 08/18/2003  . Macular degeneration (senile) of retina, unspecified 12/12/2001  . Undiagnosed cardiac murmurs 04/11/2001  . Senile osteoporosis 12/13/2000  . [place] 03/20/1999    secondary to macular degeneration  . Fracture of greater trochanter of left femur 03/08/14   Past Surgical History  Procedure Laterality Date  . Pacemaker insertion  2006    [employer] In-Pulse  . Left hip surgery  09/2005  . Right hip surgery  08/2003  . 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 Tonsillectomy    . Breast biopsy Left 1970    benign   Social History:   reports that she has never smoked. She has never used smokeless tobacco. She reports that she does not drink alcohol or use illicit drugs.  Family History  Problem Relation Age of Onset  . Colon cancer Mother   . Cancer Mother     colon  . Heart attack Father   . Heart disease Father     MI  . Heart disease Sister     MI    Medications: Patient's Medications  New Prescriptions   No medications on file  Previous Medications   ACETAMINOPHEN (TYLENOL) 325 MG TABLET    Take 650 mg by mouth every 4 (four) hours as needed for mild pain or moderate pain.    AMLODIPINE (NORVASC) 10 MG TABLET    Take 10 mg by mouth Daily.   BISACODYL (DULCOLAX) 10 MG SUPPOSITORY    Place 1 suppository (10 mg total) rectally daily as needed for moderate constipation.   CALCIUM-VITAMIN D (OSCAL WITH D) 500-200 MG-UNIT PER TABLET    Take 1 tablet by mouth 2 (two) times daily.   ENOXAPARIN (LOVENOX) 40 MG/0.4ML INJECTION    Inject 0.4 mLs (40 mg total) into the skin daily.  For 3 weeks   HYDROCODONE-ACETAMINOPHEN (NORCO/VICODIN) 5-325 MG PER TABLET    Take 1-2 tablets by mouth every 6 (six) hours as needed for moderate pain.   LECITHIN 1200 MG CAPS    Take 1,200 mg by mouth daily.    LEVOTHYROXINE (SYNTHROID, LEVOTHROID) 25 MCG TABLET    Take 12.5 mg by mouth Daily.   MAGNESIUM HYDROXIDE (MILK OF MAGNESIA) 400 MG/5ML SUSPENSION    Take 30 mLs by mouth every other day. Every other day as needed for constipation   METFORMIN (GLUCOPHAGE) 500 MG TABLET    2 (two) times daily.   METOPROLOL SUCCINATE (TOPROL-XL) 50 MG 24 HR TABLET    Take 50 mg by mouth Daily.   MIRTAZAPINE (REMERON) 7.5 MG TABLET    Take 7.5 mg by mouth at bedtime.  MULTIPLE VITAMINS-MINERALS (PRESERVISION AREDS PO)    Take 1 tablet by mouth 2 (two) times daily.    OXYBUTYNIN (DITROPAN-XL) 5 MG 24 HR TABLET    Take 5 mg by mouth Daily.   POLYETHYLENE GLYCOL POWDER (GLYCOLAX/MIRALAX) POWDER    Take 17 g by mouth daily.   SIMVASTATIN (ZOCOR) 10 MG TABLET    Take 10 mg by mouth Daily.   SODIUM PHOSPHATE (FLEET) 7-19 GM/118ML ENEM    Place 133 mLs (1 enema total) rectally daily as needed for severe constipation.  Modified Medications   No medications on file  Discontinued Medications   No medications on file     Physical Exam: Physical Exam  Constitutional: She is oriented to person, place, and time. She appears well-developed and well-nourished.  HENT:  Head: Normocephalic and atraumatic.  Right Ear: External ear normal.  Left Ear: External ear normal.  Nose: Nose normal.  Mouth/Throat: Oropharynx is clear and moist. No oropharyngeal exudate.  Eyes: Conjunctivae and EOM are normal. Pupils are equal, round, and reactive to light. Right eye exhibits no discharge. Left eye exhibits no discharge. No scleral icterus.  Neck: Normal range of motion. Neck supple. No JVD present. No thyromegaly present.  Cardiovascular: Normal rate.   Murmur heard. Pacemaker. Systolic ejection murmur 3/6    Pulmonary/Chest: Effort normal and breath sounds normal. No respiratory distress. She has no wheezes. She has no rales. She exhibits no tenderness.  Abdominal: Soft. Bowel sounds are normal. She exhibits no distension. There is no tenderness. There is no rebound.  Musculoskeletal: Normal range of motion. She exhibits no edema and no tenderness.  Lymphadenopathy:    She has no cervical adenopathy.  Neurological: She is alert and oriented to person, place, and time. She has normal reflexes. No cranial nerve deficit. She exhibits normal muscle tone. Coordination normal.  Skin: Skin is warm and dry. No rash noted. She is not diaphoretic. No erythema. No pallor.  Psychiatric: Thought content normal. Her mood appears not anxious. Her affect is not angry, not blunt, not labile and not inappropriate. Her speech is not rapid and/or pressured, not delayed and not slurred. She is slowed. She is not agitated, not aggressive, not hyperactive, not withdrawn, not actively hallucinating and not combative. Thought content is not paranoid and not delusional. Cognition and memory are impaired. She expresses impulsivity and inappropriate judgment. She exhibits a depressed mood. She exhibits abnormal recent memory.  Flat affect. Confusion-going to bed before dinner frequently.  She is attentive.    Filed Vitals:   05/29/14 1444  BP: 130/70  Pulse: 72  Temp: 96.5 F (35.8 C)  TempSrc: Tympanic  Resp: 20      Labs reviewed: Basic Metabolic Panel:  Recent Labs  16/10/96 01/16/14 03/08/14 0849 03/09/14 0425 03/24/14  NA 141  --  138 141 140  K 3.9  --  3.9 3.9 4.2  CL  --   --  100 103  --   CO2  --   --  27 24  --   GLUCOSE  --   --  152* 110*  --   BUN 18  --  17 19 22*  CREATININE 0.9  --  0.84 0.75 0.9  CALCIUM  --   --  9.8 9.1  --   TSH 2.50 2.47  --   --  2.58   Liver Function Tests:  Recent Labs  08/27/13 03/24/14  AST 37* 18  ALT 23 9  ALKPHOS 82 198*  No results found for this  basename: LIPASE, AMYLASE,  in the last 8760 hours No results found for this basename: AMMONIA,  in the last 8760 hours CBC:  Recent Labs  03/08/14 0849 03/09/14 0425 03/24/14  WBC 11.8* 10.7* 8.5  HGB 13.6 12.9 14.0  HCT 39.6 37.6 40  MCV 86.1 87.2  --   PLT 206 207 386   Lipid Panel: No results found for this basename: CHOL, HDL, LDLCALC, TRIG, CHOLHDL, LDLDIRECT,  in the last 8760 hours  Past Procedures:  Past Procedure:  03/06/14 X-ray pelvis([facility]) no acute osseous abnormality. Chronic degenerative and postsurgical findings(s/p R hip arthroplasty and left femoral neck compression screw with associated proximal femoral sideplate and fixation screws-no hardware loosening or migration visible at imaged components. R inferior ischial and inferior right pubic ramus osseous deformities consistent with previously identified remote fracture injuries)   03/08/14 X-ray lumbar spine:  IMPRESSION: 1. Chronic wedge compression fractures. 2.  No evidence for acute  abnormality.  03/08/14 CXR  IMPRESSION: Low lung volumes.  No active disease.  03/08/14 X-ray pelvis  IMPRESSION: 1. Postoperative changes. 2.  No evidence for acute  abnormality.  03/08/14 CT head w/o contrast:  IMPRESSION: 1. Atrophy and small vessel disease. 2.  No evidence for acute intracranial abnormality. 3. Right posterior parietal scalp hematoma without underlying calvarial fracture. 4. Sinusitis involving the right sphenoid air cell. No evidence for sinus wall fracture   03/08/14 CT pelvis and abd w/o contrast:  IMPRESSION: 1. Nondisplaced fracture along the left greater trochanter is new since prior exam. It is unclear if this is acute or chronic injury. 2. Postoperative changes of both hips. 3. Remote L5 compression fracture. 4. Extensive atherosclerotic changes including coronary artery disease. 5. Small hiatal hernia. 6. No acute intra-abdominal lesion to explain the  patient's symptoms.    Assessment/Plan Type 2 diabetes mellitus with blindness, with macular edema, with severe nonproliferative retinopathy takes Metformin 500mg  bid, 01/16/14 Hgb A1c 6.5. 03/24/14 Hgb 6.4   Essential hypertension Mild elevated Sbp. takes Metoprolol 50mg  daily and Amlodipine 10mg  daily.     Hypothyroidism Takes Levothyroxine 12.555mcg, 01/16/14 TSH 2.474. 03/24/14 TSH 2.576   Depression 04/05/14 her appetite was reportedly poor-weights #116Ibs 04/02/14 and # 117Ibds 04/09/14. Stabilized since  Mirtazapine 7.5 mg since the patient c/o feeling depressed per SW 04/08/14( feeling depressed, loss of interests, restlessness, and feeling like she just wants out of here. Noted flat affect. Said she felt she soul be better off dead but would not hurt herself)   Hypertonicity of bladder Better with Oxybutynin 5mg  daily.     Constipation Stable, continue MiraLax daily. Dulcolax, Fleet, and MOM prn available to her.        Family/ Staff Communication: observe the patient  Goals of Care: SNF  Labs/tests ordered: none

## 2014-05-29 NOTE — Assessment & Plan Note (Signed)
Better with Oxybutynin 5mg  daily.

## 2014-05-29 NOTE — Assessment & Plan Note (Signed)
Takes Levothyroxine 12.845mcg, 01/16/14 TSH 2.474. 03/24/14 TSH 2.576

## 2014-05-29 NOTE — Assessment & Plan Note (Addendum)
Stable, continue MiraLax daily. Dulcolax, Fleet, and MOM prn available to her.

## 2014-05-29 NOTE — Assessment & Plan Note (Signed)
Mild elevated Sbp. takes Metoprolol 50mg  daily and Amlodipine 10mg  daily.

## 2014-05-29 NOTE — Assessment & Plan Note (Signed)
takes Metformin 500mg  bid, 01/16/14 Hgb A1c 6.5. 03/24/14 Hgb 6.4

## 2014-06-02 ENCOUNTER — Encounter: Payer: Self-pay | Admitting: Gastroenterology

## 2014-06-10 ENCOUNTER — Encounter: Payer: Self-pay | Admitting: Cardiology

## 2014-06-30 ENCOUNTER — Encounter: Payer: Self-pay | Admitting: Nurse Practitioner

## 2014-06-30 ENCOUNTER — Non-Acute Institutional Stay (SKILLED_NURSING_FACILITY): Payer: 59 | Admitting: Nurse Practitioner

## 2014-06-30 DIAGNOSIS — K5900 Constipation, unspecified: Secondary | ICD-10-CM

## 2014-06-30 DIAGNOSIS — F0390 Unspecified dementia without behavioral disturbance: Secondary | ICD-10-CM

## 2014-06-30 DIAGNOSIS — F329 Major depressive disorder, single episode, unspecified: Secondary | ICD-10-CM

## 2014-06-30 DIAGNOSIS — F32A Depression, unspecified: Secondary | ICD-10-CM

## 2014-06-30 DIAGNOSIS — E039 Hypothyroidism, unspecified: Secondary | ICD-10-CM

## 2014-06-30 DIAGNOSIS — I10 Essential (primary) hypertension: Secondary | ICD-10-CM

## 2014-06-30 DIAGNOSIS — H540 Blindness, both eyes: Secondary | ICD-10-CM

## 2014-06-30 DIAGNOSIS — N318 Other neuromuscular dysfunction of bladder: Secondary | ICD-10-CM

## 2014-06-30 DIAGNOSIS — E113419 Type 2 diabetes mellitus with severe nonproliferative diabetic retinopathy with macular edema, unspecified eye: Secondary | ICD-10-CM

## 2014-06-30 DIAGNOSIS — E11341 Type 2 diabetes mellitus with severe nonproliferative diabetic retinopathy with macular edema: Secondary | ICD-10-CM

## 2014-06-30 DIAGNOSIS — H547 Unspecified visual loss: Secondary | ICD-10-CM

## 2014-06-30 DIAGNOSIS — I480 Paroxysmal atrial fibrillation: Secondary | ICD-10-CM

## 2014-06-30 NOTE — Assessment & Plan Note (Signed)
Takes Levothyroxine 12.5mcg, 01/16/14 TSH 2.474. 03/24/14 TSH 2.576  

## 2014-06-30 NOTE — Progress Notes (Signed)
Patient ID: [name]. [name], female   DOB: April 29, 1915, 80 y.o.   MRN: [id number]   Code Status: DNR  Allergies  Allergen Reactions  . Codeine     unknown  . Ibuprofen     unknown  . Lisinopril     unknown  . Penicillins     unknown  . Philis Nettle [Trospium Chloride]     unknown    Chief Complaint  Patient presents with  . Medical Management of Chronic Issues    HPI: Patient is a 80 y.o. female seen in the SNF at [facility] today for evaluation of chronic medical conditions.  Problem List Items Addressed This Visit    Type 2 diabetes mellitus with blindness, with macular edema, with severe nonproliferative retinopathy    takes Metformin 500mg  bid, 01/16/14 Hgb A1c 6.5. 03/24/14 Hgb 6.4      Hypothyroidism    Takes Levothyroxine 12.42mcg, 01/16/14 TSH 2.474. 03/24/14 TSH 2.576      Hypertonicity of bladder    Better with Oxybutynin 5mg  daily.       Essential hypertension - Primary    Controlled, takes Metoprolol 50mg  daily and Amlodipine 10mg  daily.        Depression    04/05/14 her appetite was reportedly poor-weights #116Ibs 04/02/14 and # 117Ibds 04/09/14. Stabilized since  Mirtazapine 7.5 mg since the patient c/o feeling depressed per SW 04/08/14( feeling depressed, loss of interests, restlessness, and feeling like she just wants out of here. Noted flat affect. Said she felt she soul be better off dead but would not hurt herself) 06/30/14 stable, adjusting to [facility] well.       Dementia    04/03/14 MMSE 22/30. May consider memory preserving meds if no better after adjusting to SNF and better managing mood.       Constipation    Stable, continue MiraLax daily. Dulcolax, Fleet, and MOM prn available to her.         ATRIAL FIBRILLATION    Rate controlled.          Review of Systems:  Review of Systems  Constitutional: Negative for fever, chills, weight loss, malaise/fatigue and diaphoresis.  HENT: Positive for hearing loss (severe with hearing aids. ).  Negative for congestion, ear discharge, ear pain, nosebleeds, sore throat and tinnitus.        Severe HOH R+L  Eyes: Positive for blurred vision (macular degeneration ). Negative for pain, discharge and redness.       Only sees objects.   Respiratory: Negative for cough, hemoptysis, sputum production, shortness of breath, wheezing and stridor.   Cardiovascular: Negative for chest pain, palpitations, orthopnea, claudication, leg swelling and PND.  Gastrointestinal: Negative for heartburn, nausea, vomiting, abdominal pain, diarrhea, constipation, blood in stool and melena.  Genitourinary: Positive for frequency (chronic). Negative for dysuria, urgency, hematuria and flank pain.  Musculoskeletal: Positive for joint pain and falls. Negative for myalgias, back pain and neck pain.       Left hip pain with ROM and weight bearing.   Skin: Negative for itching and rash.  Neurological: Negative for dizziness, tingling, tremors, sensory change, speech change, focal weakness, seizures, loss of consciousness, weakness and headaches.  Endo/Heme/Allergies: Negative for environmental allergies and polydipsia. Does not bruise/bleed easily.  Psychiatric/Behavioral: Positive for depression and memory loss. Negative for hallucinations. The patient is not nervous/anxious and does not have insomnia.      Past Medical History  Diagnosis Date  . HTN (hypertension)   . Other and unspecified  hyperlipidemia   . DM (diabetes mellitus)   . Hyperthyroidism   . Hemorrhoids   . AF (atrial fibrillation)   . Rectal bleeding   . Sinus node dysfunction   . Hip fracture   . Bradycardia   . Aortic stenosis   . Mitral valve prolapse   . Hearing loss   . Memory loss 02/16/2012  . Disturbance of salivary secretion [id number]  . Unspecified hereditary and idiopathic peripheral neuropathy 10/28/202012  . Closed fracture of unspecified trochanteric section of femur 05/29/2011  . Anxiety state, unspecified 08/19/2010  . Diffuse  cystic mastopathy 12/23/2009  . Flatulence, eructation, and gas pain 12/03/2009  . Closed fracture of lumbar vertebra without mention of spinal cord injury 1December 04, 202010  . Personal history of fall 01/29/2009  . Occlusion and stenosis of carotid artery without mention of cerebral infarction 12/27/2007  . Syncope and collapse 12/27/2007  . Other malaise and fatigue 12/06/2007  . Unspecified urinary incontinence 02/22/2005  . Cardiac pacemaker in situ 09/12/2004  . Unspecified hypothyroidism 03/18/2010  . Abnormality of gait 09/08/2003  . Edema 08/18/2003  . Macular degeneration (senile) of retina, unspecified 12/12/2001  . Undiagnosed cardiac murmurs 04/11/2001  . Senile osteoporosis 12/13/2000  . Legal blindness, as defined in [place] 03/20/1999    secondary to macular degeneration  . Fracture of greater trochanter of left femur 03/08/14   Past Surgical History  Procedure Laterality Date  . Pacemaker insertion  2006    [employer] In-Pulse  . Left hip surgery  09/2005  . Right hip surgery  08/2003  . 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 Tonsillectomy    . Breast biopsy Left 1970    benign   Social History:   reports that she has never smoked. She has never used smokeless tobacco. She reports that she does not drink alcohol or use illicit drugs.  Family History  Problem Relation Age of Onset  . Colon cancer Mother   . Cancer Mother     colon  . Heart attack Father   . Heart disease Father     MI  . Heart disease Sister     MI    Medications: Patient's Medications  New Prescriptions   No medications on file  Previous Medications   ACETAMINOPHEN (TYLENOL) 325 MG TABLET    Take 650 mg by mouth every 4 (four) hours as needed for mild pain or moderate pain.    AMLODIPINE (NORVASC) 10 MG TABLET    Take 10 mg by mouth Daily.   BISACODYL (DULCOLAX) 10 MG SUPPOSITORY    Place 1 suppository (10 mg total) rectally daily as needed for  moderate constipation.   CALCIUM-VITAMIN D (OSCAL WITH D) 500-200 MG-UNIT PER TABLET    Take 1 tablet by mouth 2 (two) times daily.   ENOXAPARIN (LOVENOX) 40 MG/0.4ML INJECTION    Inject 0.4 mLs (40 mg total) into the skin daily. For 3 weeks   HYDROCODONE-ACETAMINOPHEN (NORCO/VICODIN) 5-325 MG PER TABLET    Take 1-2 tablets by mouth every 6 (six) hours as needed for moderate pain.   LECITHIN 1200 MG CAPS    Take 1,200 mg by mouth daily.    LEVOTHYROXINE (SYNTHROID, LEVOTHROID) 25 MCG TABLET    Take 12.5 mg by mouth Daily.   MAGNESIUM HYDROXIDE (MILK OF MAGNESIA) 400 MG/5ML SUSPENSION    Take 30 mLs by mouth every other day. Every other day as needed for constipation  METFORMIN (GLUCOPHAGE) 500 MG TABLET    2 (two) times daily.   METOPROLOL SUCCINATE (TOPROL-XL) 50 MG 24 HR TABLET    Take 50 mg by mouth Daily.   MIRTAZAPINE (REMERON) 7.5 MG TABLET    Take 7.5 mg by mouth at bedtime.   MULTIPLE VITAMINS-MINERALS (PRESERVISION AREDS PO)    Take 1 tablet by mouth 2 (two) times daily.    OXYBUTYNIN (DITROPAN-XL) 5 MG 24 HR TABLET    Take 5 mg by mouth Daily.   POLYETHYLENE GLYCOL POWDER (GLYCOLAX/MIRALAX) POWDER    Take 17 g by mouth daily.   SIMVASTATIN (ZOCOR) 10 MG TABLET    Take 10 mg by mouth Daily.   SODIUM PHOSPHATE (FLEET) 7-19 GM/118ML ENEM    Place 133 mLs (1 enema total) rectally daily as needed for severe constipation.  Modified Medications   No medications on file  Discontinued Medications   No medications on file     Physical Exam: Physical Exam  Constitutional: She is oriented to person, place, and time. She appears well-developed and well-nourished.  HENT:  Head: Normocephalic and atraumatic.  Right Ear: External ear normal.  Left Ear: External ear normal.  Nose: Nose normal.  Mouth/Throat: Oropharynx is clear and moist. No oropharyngeal exudate.  Eyes: Conjunctivae and EOM are normal. Pupils are equal, round, and reactive to light. Right eye exhibits no discharge. Left eye  exhibits no discharge. No scleral icterus.  Neck: Normal range of motion. Neck supple. No JVD present. No thyromegaly present.  Cardiovascular: Normal rate.   Murmur heard. Pacemaker. Systolic ejection murmur 3/6  Pulmonary/Chest: Effort normal and breath sounds normal. No respiratory distress. She has no wheezes. She has no rales. She exhibits no tenderness.  Abdominal: Soft. Bowel sounds are normal. She exhibits no distension. There is no tenderness. There is no rebound.  Musculoskeletal: Normal range of motion. She exhibits no edema or tenderness.  Lymphadenopathy:    She has no cervical adenopathy.  Neurological: She is alert and oriented to person, place, and time. She has normal reflexes. No cranial nerve deficit. She exhibits normal muscle tone. Coordination normal.  Skin: Skin is warm and dry. No rash noted. She is not diaphoretic. No erythema. No pallor.  Psychiatric: Thought content normal. Her mood appears not anxious. Her affect is not angry, not blunt, not labile and not inappropriate. Her speech is not rapid and/or pressured, not delayed and not slurred. She is slowed. She is not agitated, not aggressive, not hyperactive, not withdrawn, not actively hallucinating and not combative. Thought content is not paranoid and not delusional. Cognition and memory are impaired. She expresses impulsivity and inappropriate judgment. She exhibits a depressed mood. She exhibits abnormal recent memory.  Flat affect. Confusion-going to bed before dinner frequently.  She is attentive.    Filed Vitals:   06/30/14 1706  BP: 140/60  Pulse: 68  Temp: 98.3 F (36.8 C)  TempSrc: Tympanic  Resp: 16      Labs reviewed: Basic Metabolic Panel:  Recent Labs  16/05/9600/27/15 01/16/14 03/08/14 0849 03/09/14 0425 03/24/14  NA 141  --  138 141 140  K 3.9  --  3.9 3.9 4.2  CL  --   --  100 103  --   CO2  --   --  27 24  --   GLUCOSE  --   --  152* 110*  --   BUN 18  --  17 19 22*  CREATININE 0.9  --   0.84 0.75 0.9  CALCIUM  --   --  9.8 9.1  --   TSH 2.50 2.47  --   --  2.58   Liver Function Tests:  Recent Labs  08/27/13 03/24/14  AST 37* 18  ALT 23 9  ALKPHOS 82 198*   No results for input(s): LIPASE, AMYLASE in the last 8760 hours. No results for input(s): AMMONIA in the last 8760 hours. CBC:  Recent Labs  03/08/14 0849 03/09/14 0425 03/24/14  WBC 11.8* 10.7* 8.5  HGB 13.6 12.9 14.0  HCT 39.6 37.6 40  MCV 86.1 87.2  --   PLT 206 207 386   Lipid Panel: No results for input(s): CHOL, HDL, LDLCALC, TRIG, CHOLHDL, LDLDIRECT in the last 8760 hours.  Past Procedures:  Past Procedure:  03/06/14 X-ray pelvis([facility]) no acute osseous abnormality. Chronic degenerative and postsurgical findings(s/p R hip arthroplasty and left femoral neck compression screw with associated proximal femoral sideplate and fixation screws-no hardware loosening or migration visible at imaged components. R inferior ischial and inferior right pubic ramus osseous deformities consistent with previously identified remote fracture injuries)   03/08/14 X-ray lumbar spine:  IMPRESSION: 1. Chronic wedge compression fractures. 2.  No evidence for acute  abnormality.  03/08/14 CXR  IMPRESSION: Low lung volumes.  No active disease.  03/08/14 X-ray pelvis  IMPRESSION: 1. Postoperative changes. 2.  No evidence for acute  abnormality.  03/08/14 CT head w/o contrast:  IMPRESSION: 1. Atrophy and small vessel disease. 2.  No evidence for acute intracranial abnormality. 3. Right posterior parietal scalp hematoma without underlying calvarial fracture. 4. Sinusitis involving the right sphenoid air cell. No evidence for sinus wall fracture   03/08/14 CT pelvis and abd w/o contrast:  IMPRESSION: 1. Nondisplaced fracture along the left greater trochanter is new since prior exam. It is unclear if this is acute or chronic injury. 2. Postoperative changes of both hips. 3. Remote L5 compression fracture. 4.  Extensive atherosclerotic changes including coronary artery disease. 5. Small hiatal hernia. 6. No acute intra-abdominal lesion to explain the patient's symptoms.    Assessment/Plan Type 2 diabetes mellitus with blindness, with macular edema, with severe nonproliferative retinopathy takes Metformin 500mg  bid, 01/16/14 Hgb A1c 6.5. 03/24/14 Hgb 6.4    Hypothyroidism Takes Levothyroxine 12.325mcg, 01/16/14 TSH 2.474. 03/24/14 TSH 2.576    Hypertonicity of bladder Better with Oxybutynin 5mg  daily.     Essential hypertension Controlled, takes Metoprolol 50mg  daily and Amlodipine 10mg  daily.      Depression 04/05/14 her appetite was reportedly poor-weights #116Ibs 04/02/14 and # 117Ibds 04/09/14. Stabilized since  Mirtazapine 7.5 mg since the patient c/o feeling depressed per SW 04/08/14( feeling depressed, loss of interests, restlessness, and feeling like she just wants out of here. Noted flat affect. Said she felt she soul be better off dead but would not hurt herself) 06/30/14 stable, adjusting to [facility] well.     Dementia 04/03/14 MMSE 22/30. May consider memory preserving meds if no better after adjusting to SNF and better managing mood.     Constipation Stable, continue MiraLax daily. Dulcolax, Fleet, and MOM prn available to her.       ATRIAL FIBRILLATION Rate controlled.       Family/ Staff Communication: observe the patient  Goals of Care: SNF  Labs/tests ordered: none

## 2014-06-30 NOTE — Assessment & Plan Note (Signed)
Controlled, takes Metoprolol 50mg  daily and Amlodipine 10mg  daily.

## 2014-06-30 NOTE — Assessment & Plan Note (Signed)
04/05/14 her appetite was reportedly poor-weights #116Ibs 04/02/14 and # 117Ibds 04/09/14. Stabilized since  Mirtazapine 7.5 mg since the patient c/o feeling depressed per SW 04/08/14( feeling depressed, loss of interests, restlessness, and feeling like she just wants out of here. Noted flat affect. Said she felt she soul be better off dead but would not hurt herself) 06/30/14 stable, adjusting to [facility] well.

## 2014-06-30 NOTE — Assessment & Plan Note (Signed)
Better with Oxybutynin 5mg daily.    

## 2014-06-30 NOTE — Assessment & Plan Note (Signed)
Rate controlled 

## 2014-06-30 NOTE — Assessment & Plan Note (Signed)
04/03/14 MMSE 22/30. May consider memory preserving meds if no better after adjusting to SNF and better managing mood.   

## 2014-06-30 NOTE — Assessment & Plan Note (Signed)
Stable, continue MiraLax daily. Dulcolax, Fleet, and MOM prn available to her.     

## 2014-06-30 NOTE — Assessment & Plan Note (Signed)
takes Metformin 500mg bid, 01/16/14 Hgb A1c 6.5. 03/24/14 Hgb 6.4  

## 2014-07-09 ENCOUNTER — Telehealth: Payer: Self-pay | Admitting: Internal Medicine

## 2014-07-09 NOTE — Telephone Encounter (Signed)
Forwarding to device clinic to address 

## 2014-07-09 NOTE — Telephone Encounter (Signed)
LMOM--[id number]//[id number]

## 2014-07-09 NOTE — Telephone Encounter (Signed)
New Msg  Please contact [name] or Vladimir Creeksavid Cook in regards to Pt having yearly office visit with Dr, Graciela HusbandsKlein for phys. Pacer check. [name] Adriana SimasCook states pt is now in memory care, walks with cane and is almost blind. She wants to know if pt is required to come into the office to have phys. Pacer check completed or if there are any alternatives. Contact # for Elita Quickam or [name] is [phone].

## 2014-07-10 ENCOUNTER — Encounter: Payer: 59 | Admitting: Internal Medicine

## 2014-07-10 NOTE — Telephone Encounter (Signed)
Spoke w/[name] in regards to pt coming in for office checks. Pt just to have remote checks every 3months and at this time no office checks. Pt was transitioning to another location when last remote check was scheduled. [name] to make sure transmission is sent in. [name] aware that letter will be sent with next transmission date once transmission is received.

## 2014-07-29 ENCOUNTER — Encounter: Payer: Self-pay | Admitting: *Deleted

## 2014-07-30 ENCOUNTER — Non-Acute Institutional Stay (SKILLED_NURSING_FACILITY): Payer: 59 | Admitting: Nurse Practitioner

## 2014-07-30 ENCOUNTER — Encounter: Payer: Self-pay | Admitting: Nurse Practitioner

## 2014-07-30 DIAGNOSIS — I10 Essential (primary) hypertension: Secondary | ICD-10-CM

## 2014-07-30 DIAGNOSIS — K5900 Constipation, unspecified: Secondary | ICD-10-CM

## 2014-07-30 DIAGNOSIS — I482 Chronic atrial fibrillation, unspecified: Secondary | ICD-10-CM

## 2014-07-30 DIAGNOSIS — H547 Unspecified visual loss: Principal | ICD-10-CM

## 2014-07-30 DIAGNOSIS — F329 Major depressive disorder, single episode, unspecified: Secondary | ICD-10-CM

## 2014-07-30 DIAGNOSIS — F0390 Unspecified dementia without behavioral disturbance: Secondary | ICD-10-CM

## 2014-07-30 DIAGNOSIS — F32A Depression, unspecified: Secondary | ICD-10-CM

## 2014-07-30 DIAGNOSIS — E113419 Type 2 diabetes mellitus with severe nonproliferative diabetic retinopathy with macular edema, unspecified eye: Secondary | ICD-10-CM

## 2014-07-30 DIAGNOSIS — E039 Hypothyroidism, unspecified: Secondary | ICD-10-CM

## 2014-07-30 DIAGNOSIS — E11341 Type 2 diabetes mellitus with severe nonproliferative diabetic retinopathy with macular edema: Secondary | ICD-10-CM

## 2014-07-30 DIAGNOSIS — H540 Blindness, both eyes: Secondary | ICD-10-CM

## 2014-07-30 DIAGNOSIS — N318 Other neuromuscular dysfunction of bladder: Secondary | ICD-10-CM

## 2014-07-30 NOTE — Assessment & Plan Note (Signed)
takes Metformin 500mg bid, 01/16/14 Hgb A1c 6.5. 03/24/14 Hgb 6.4  

## 2014-07-30 NOTE — Progress Notes (Signed)
Patient ID: [name]. Quevedo, female   DOB: [birth year], 80 y.o.   MRN: [id number]   Code Status: DNR  Allergies  Allergen Reactions  . Codeine     unknown  . Ibuprofen     unknown  . Lisinopril     unknown  . Penicillins     unknown  . Philis Nettle [Trospium Chloride]     unknown    Chief Complaint  Patient presents with  . Medical Management of Chronic Issues    HPI: Patient is a 80 y.o. female seen in the SNF at [facility] today for evaluation of chronic medical conditions.  Problem List Items Addressed This Visit    Type 2 diabetes mellitus with blindness, with macular edema, with severe nonproliferative retinopathy - Primary    takes Metformin 500mg  bid, 01/16/14 Hgb A1c 6.5. 03/24/14 Hgb 6.4      Hypothyroidism    Takes Levothyroxine 12.66mcg, 01/16/14 TSH 2.474. 03/24/14 TSH 2.576     Hypertonicity of bladder    Better with Oxybutynin 5mg  daily.      Essential hypertension    Controlled, takes Metoprolol 50mg  daily and Amlodipine 10mg  daily.      Depression    04/05/14 her appetite was reportedly poor-weights #116Ibs 04/02/14 and # 117Ibds 04/09/14. Stabilized since  Mirtazapine 7.5 mg since the patient c/o feeling depressed per SW 04/08/14( feeling depressed, loss of interests, restlessness, and feeling like she just wants out of here. Noted flat affect. Said she felt she soul be better off dead but would not hurt herself) 06/30/14 stable, adjusting to [facility] well.  07/30/14 stable.       Dementia    04/03/14 MMSE 22/30. May consider memory preserving meds if no better after adjusting to SNF and better managing mood.      Constipation    Stable, continue MiraLax daily. Dulcolax, Fleet, and MOM prn available to her.      ATRIAL FIBRILLATION    Rate controlled.         Review of Systems:  Review of Systems  Constitutional: Negative for fever, chills, weight loss, malaise/fatigue and diaphoresis.  HENT: Positive for hearing loss (severe with hearing aids. ).  Negative for congestion, ear discharge, ear pain, nosebleeds, sore throat and tinnitus.        Severe HOH R+L  Eyes: Positive for blurred vision (macular degeneration ). Negative for pain, discharge and redness.       Only sees objects.   Respiratory: Negative for cough, hemoptysis, sputum production, shortness of breath, wheezing and stridor.   Cardiovascular: Negative for chest pain, palpitations, orthopnea, claudication, leg swelling and PND.  Gastrointestinal: Negative for heartburn, nausea, vomiting, abdominal pain, diarrhea, constipation, blood in stool and melena.  Genitourinary: Positive for frequency (chronic). Negative for dysuria, urgency, hematuria and flank pain.  Musculoskeletal: Positive for joint pain and falls. Negative for myalgias, back pain and neck pain.       Left hip pain with ROM and weight bearing.   Skin: Negative for itching and rash.  Neurological: Negative for dizziness, tingling, tremors, sensory change, speech change, focal weakness, seizures, loss of consciousness, weakness and headaches.  Endo/Heme/Allergies: Negative for environmental allergies and polydipsia. Does not bruise/bleed easily.  Psychiatric/Behavioral: Positive for depression and memory loss. Negative for hallucinations. The patient is not nervous/anxious and does not have insomnia.      Past Medical History  Diagnosis Date  . HTN (hypertension)   . Other and unspecified hyperlipidemia   . DM (diabetes  mellitus)   . Hyperthyroidism   . Hemorrhoids   . AF (atrial fibrillation)   . Rectal bleeding   . Sinus node dysfunction   . Hip fracture   . Bradycardia   . Aortic stenosis   . Mitral valve prolapse   . Hearing loss   . Memory loss 02/16/2012  . Disturbance of salivary secretion [id number]  . Unspecified hereditary and idiopathic peripheral neuropathy 10/28/202012  . Closed fracture of unspecified trochanteric section of femur 05/29/2011  . Anxiety state, unspecified 08/19/2010  . Diffuse  cystic mastopathy 12/23/2009  . Flatulence, eructation, and gas pain 12/03/2009  . Closed fracture of lumbar vertebra without mention of spinal cord injury 12020/11/3108  . Personal history of fall 01/29/2009  . Occlusion and stenosis of carotid artery without mention of cerebral infarction 12/27/2007  . Syncope and collapse 12/27/2007  . Other malaise and fatigue 12/06/2007  . Unspecified urinary incontinence 02/22/2005  . Cardiac pacemaker in situ 09/12/2004  . Unspecified hypothyroidism 03/18/2010  . Abnormality of gait 09/08/2003  . Edema 08/18/2003  . Macular degeneration (senile) of retina, unspecified 12/12/2001  . Undiagnosed cardiac murmurs 04/11/2001  . Senile osteoporosis 12/13/2000  . Legal blindness, as defined in [place] 03/20/1999    secondary to macular degeneration  . Fracture of greater trochanter of left femur 03/08/14   Past Surgical History  Procedure Laterality Date  . Pacemaker insertion  2006    [employer] In-Pulse  . Left hip surgery  09/2005  . Right hip surgery  08/2003  . 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 Tonsillectomy    . Breast biopsy Left 1970    benign   Social History:   reports that she has never smoked. She has never used smokeless tobacco. She reports that she does not drink alcohol or use illicit drugs.  Family History  Problem Relation Age of Onset  . Colon cancer Mother   . Cancer Mother     colon  . Heart attack Father   . Heart disease Father     MI  . Heart disease Sister     MI    Medications: Patient's Medications  New Prescriptions   No medications on file  Previous Medications   ACETAMINOPHEN (TYLENOL) 325 MG TABLET    Take 650 mg by mouth every 4 (four) hours as needed for mild pain or moderate pain.    AMLODIPINE (NORVASC) 10 MG TABLET    Take 10 mg by mouth Daily.   BISACODYL (DULCOLAX) 10 MG SUPPOSITORY    Place 1 suppository (10 mg total) rectally daily as needed for  moderate constipation.   CALCIUM-VITAMIN D (OSCAL WITH D) 500-200 MG-UNIT PER TABLET    Take 1 tablet by mouth 2 (two) times daily.   ENOXAPARIN (LOVENOX) 40 MG/0.4ML INJECTION    Inject 0.4 mLs (40 mg total) into the skin daily. For 3 weeks   HYDROCODONE-ACETAMINOPHEN (NORCO/VICODIN) 5-325 MG PER TABLET    Take 1-2 tablets by mouth every 6 (six) hours as needed for moderate pain.   LECITHIN 1200 MG CAPS    Take 1,200 mg by mouth daily.    LEVOTHYROXINE (SYNTHROID, LEVOTHROID) 25 MCG TABLET    Take 12.5 mg by mouth Daily.   MAGNESIUM HYDROXIDE (MILK OF MAGNESIA) 400 MG/5ML SUSPENSION    Take 30 mLs by mouth every other day. Every other day as needed for constipation   METFORMIN (GLUCOPHAGE) 500 MG TABLET  2 (two) times daily.   METOPROLOL SUCCINATE (TOPROL-XL) 50 MG 24 HR TABLET    Take 50 mg by mouth Daily.   MIRTAZAPINE (REMERON) 7.5 MG TABLET    Take 7.5 mg by mouth at bedtime.   MULTIPLE VITAMINS-MINERALS (PRESERVISION AREDS PO)    Take 1 tablet by mouth 2 (two) times daily.    OXYBUTYNIN (DITROPAN-XL) 5 MG 24 HR TABLET    Take 5 mg by mouth Daily.   POLYETHYLENE GLYCOL POWDER (GLYCOLAX/MIRALAX) POWDER    Take 17 g by mouth daily.   SIMVASTATIN (ZOCOR) 10 MG TABLET    Take 10 mg by mouth Daily.   SODIUM PHOSPHATE (FLEET) 7-19 GM/118ML ENEM    Place 133 mLs (1 enema total) rectally daily as needed for severe constipation.  Modified Medications   No medications on file  Discontinued Medications   No medications on file     Physical Exam: Physical Exam  Constitutional: She is oriented to person, place, and time. She appears well-developed and well-nourished.  HENT:  Head: Normocephalic and atraumatic.  Right Ear: External ear normal.  Left Ear: External ear normal.  Nose: Nose normal.  Mouth/Throat: Oropharynx is clear and moist. No oropharyngeal exudate.  Eyes: Conjunctivae and EOM are normal. Pupils are equal, round, and reactive to light. Right eye exhibits no discharge. Left eye  exhibits no discharge. No scleral icterus.  Neck: Normal range of motion. Neck supple. No JVD present. No thyromegaly present.  Cardiovascular: Normal rate.   Murmur heard. Pacemaker. Systolic ejection murmur 3/6  Pulmonary/Chest: Effort normal and breath sounds normal. No respiratory distress. She has no wheezes. She has no rales. She exhibits no tenderness.  Abdominal: Soft. Bowel sounds are normal. She exhibits no distension. There is no tenderness. There is no rebound.  Musculoskeletal: Normal range of motion. She exhibits no edema or tenderness.  Lymphadenopathy:    She has no cervical adenopathy.  Neurological: She is alert and oriented to person, place, and time. She has normal reflexes. No cranial nerve deficit. She exhibits normal muscle tone. Coordination normal.  Skin: Skin is warm and dry. No rash noted. She is not diaphoretic. No erythema. No pallor.  Psychiatric: Thought content normal. Her mood appears not anxious. Her affect is not angry, not blunt, not labile and not inappropriate. Her speech is not rapid and/or pressured, not delayed and not slurred. She is slowed. She is not agitated, not aggressive, not hyperactive, not withdrawn, not actively hallucinating and not combative. Thought content is not paranoid and not delusional. Cognition and memory are impaired. She expresses impulsivity and inappropriate judgment. She exhibits a depressed mood. She exhibits abnormal recent memory.  Flat affect. Confusion-going to bed before dinner frequently.  She is attentive.    Filed Vitals:   07/30/14 1548  BP: 140/60  Pulse: 68  Temp: 98.3 F (36.8 C)  TempSrc: Tympanic  Resp: 16      Labs reviewed: Basic Metabolic Panel:  Recent Labs  40/98/1101/27/15 01/16/14 03/08/14 0849 03/09/14 0425 03/24/14  NA 141  --  138 141 140  K 3.9  --  3.9 3.9 4.2  CL  --   --  100 103  --   CO2  --   --  27 24  --   GLUCOSE  --   --  152* 110*  --   BUN 18  --  17 19 22*  CREATININE 0.9  --   0.84 0.75 0.9  CALCIUM  --   --  9.8 9.1  --   TSH 2.50 2.47  --   --  2.58   Liver Function Tests:  Recent Labs  08/27/13 03/24/14  AST 37* 18  ALT 23 9  ALKPHOS 82 198*   No results for input(s): LIPASE, AMYLASE in the last 8760 hours. No results for input(s): AMMONIA in the last 8760 hours. CBC:  Recent Labs  03/08/14 0849 03/09/14 0425 03/24/14  WBC 11.8* 10.7* 8.5  HGB 13.6 12.9 14.0  HCT 39.6 37.6 40  MCV 86.1 87.2  --   PLT 206 207 386   Lipid Panel: No results for input(s): CHOL, HDL, LDLCALC, TRIG, CHOLHDL, LDLDIRECT in the last 8760 hours.  Past Procedures:  Past Procedure:  03/06/14 X-ray pelvis([facility]) no acute osseous abnormality. Chronic degenerative and postsurgical findings(s/p R hip arthroplasty and left femoral neck compression screw with associated proximal femoral sideplate and fixation screws-no hardware loosening or migration visible at imaged components. R inferior ischial and inferior right pubic ramus osseous deformities consistent with previously identified remote fracture injuries)   03/08/14 X-ray lumbar spine:  IMPRESSION: 1. Chronic wedge compression fractures. 2.  No evidence for acute  abnormality.  03/08/14 CXR  IMPRESSION: Low lung volumes.  No active disease.  03/08/14 X-ray pelvis  IMPRESSION: 1. Postoperative changes. 2.  No evidence for acute  abnormality.  03/08/14 CT head w/o contrast:  IMPRESSION: 1. Atrophy and small vessel disease. 2.  No evidence for acute intracranial abnormality. 3. Right posterior parietal scalp hematoma without underlying calvarial fracture. 4. Sinusitis involving the right sphenoid air cell. No evidence for sinus wall fracture   03/08/14 CT pelvis and abd w/o contrast:  IMPRESSION: 1. Nondisplaced fracture along the left greater trochanter is new since prior exam. It is unclear if this is acute or chronic injury. 2. Postoperative changes of both hips. 3. Remote L5 compression fracture. 4.  Extensive atherosclerotic changes including coronary artery disease. 5. Small hiatal hernia. 6. No acute intra-abdominal lesion to explain the patient's symptoms.    Assessment/Plan Type 2 diabetes mellitus with blindness, with macular edema, with severe nonproliferative retinopathy takes Metformin 500mg  bid, 01/16/14 Hgb A1c 6.5. 03/24/14 Hgb 6.4    Hypothyroidism Takes Levothyroxine 12.385mcg, 01/16/14 TSH 2.474. 03/24/14 TSH 2.576   Hypertonicity of bladder Better with Oxybutynin 5mg  daily.    Essential hypertension Controlled, takes Metoprolol 50mg  daily and Amlodipine 10mg  daily.    Depression 04/05/14 her appetite was reportedly poor-weights #116Ibs 04/02/14 and # 117Ibds 04/09/14. Stabilized since  Mirtazapine 7.5 mg since the patient c/o feeling depressed per SW 04/08/14( feeling depressed, loss of interests, restlessness, and feeling like she just wants out of here. Noted flat affect. Said she felt she soul be better off dead but would not hurt herself) 06/30/14 stable, adjusting to [facility] well.  07/30/14 stable.     Dementia 04/03/14 MMSE 22/30. May consider memory preserving meds if no better after adjusting to SNF and better managing mood.    Constipation Stable, continue MiraLax daily. Dulcolax, Fleet, and MOM prn available to her.    ATRIAL FIBRILLATION Rate controlled.      Family/ Staff Communication: observe the patient  Goals of Care: SNF  Labs/tests ordered: none

## 2014-07-30 NOTE — Assessment & Plan Note (Signed)
Rate controlled 

## 2014-07-30 NOTE — Assessment & Plan Note (Signed)
Controlled, takes Metoprolol 50mg daily and Amlodipine 10mg daily.    

## 2014-07-30 NOTE — Assessment & Plan Note (Signed)
Stable, continue MiraLax daily. Dulcolax, Fleet, and MOM prn available to her.     

## 2014-07-30 NOTE — Assessment & Plan Note (Signed)
Better with Oxybutynin 5mg daily.    

## 2014-07-30 NOTE — Assessment & Plan Note (Signed)
04/05/14 her appetite was reportedly poor-weights #116Ibs 04/02/14 and # 117Ibds 04/09/14. Stabilized since  Mirtazapine 7.5 mg since the patient c/o feeling depressed per SW 04/08/14( feeling depressed, loss of interests, restlessness, and feeling like she just wants out of here. Noted flat affect. Said she felt she soul be better off dead but would not hurt herself) 06/30/14 stable, adjusting to [facility] well.  07/30/14 stable.

## 2014-07-30 NOTE — Assessment & Plan Note (Signed)
04/03/14 MMSE 22/30. May consider memory preserving meds if no better after adjusting to SNF and better managing mood.

## 2014-07-30 NOTE — Assessment & Plan Note (Signed)
Takes Levothyroxine 12.5mcg, 01/16/14 TSH 2.474. 03/24/14 TSH 2.576  

## 2014-08-18 ENCOUNTER — Ambulatory Visit (INDEPENDENT_AMBULATORY_CARE_PROVIDER_SITE_OTHER): Payer: Medicare PPO | Admitting: *Deleted

## 2014-08-18 DIAGNOSIS — I495 Sick sinus syndrome: Secondary | ICD-10-CM

## 2014-08-18 NOTE — Progress Notes (Signed)
Remote pacemaker transmission.   

## 2014-08-19 LAB — MDC_IDC_ENUM_SESS_TYPE_REMOTE
Battery Impedance: 6789 Ohm
Battery Remaining Longevity: 5 mo
Battery Voltage: 2.62 V
Brady Statistic RV Percent Paced: 0.2 %
Lead Channel Impedance Value: 67 Ohm
Lead Channel Impedance Value: 748 Ohm
Lead Channel Setting Pacing Amplitude: 2 V
MDC IDC SESS DTM: 20160118185213
MDC IDC SET LEADCHNL RV PACING PULSEWIDTH: 0.4 ms
MDC IDC SET LEADCHNL RV SENSING SENSITIVITY: 4 mV

## 2014-08-26 ENCOUNTER — Telehealth: Payer: Self-pay | Admitting: Internal Medicine

## 2014-08-26 NOTE — Telephone Encounter (Signed)
New Msg        [name] Glendell [place] calling about life of pacemaker, is pt dependent on it?   States pt was informed 3months left on it.   Please contact [name] at [phone].

## 2014-08-26 NOTE — Telephone Encounter (Signed)
Routing to device clinic to address 

## 2014-08-27 ENCOUNTER — Non-Acute Institutional Stay (SKILLED_NURSING_FACILITY): Payer: 59 | Admitting: Nurse Practitioner

## 2014-08-27 ENCOUNTER — Encounter: Payer: Self-pay | Admitting: Nurse Practitioner

## 2014-08-27 DIAGNOSIS — N318 Other neuromuscular dysfunction of bladder: Secondary | ICD-10-CM

## 2014-08-27 DIAGNOSIS — E11341 Type 2 diabetes mellitus with severe nonproliferative diabetic retinopathy with macular edema: Secondary | ICD-10-CM

## 2014-08-27 DIAGNOSIS — I480 Paroxysmal atrial fibrillation: Secondary | ICD-10-CM

## 2014-08-27 DIAGNOSIS — K5900 Constipation, unspecified: Secondary | ICD-10-CM

## 2014-08-27 DIAGNOSIS — E039 Hypothyroidism, unspecified: Secondary | ICD-10-CM

## 2014-08-27 DIAGNOSIS — H540 Blindness, both eyes: Secondary | ICD-10-CM

## 2014-08-27 DIAGNOSIS — F32A Depression, unspecified: Secondary | ICD-10-CM

## 2014-08-27 DIAGNOSIS — F0390 Unspecified dementia without behavioral disturbance: Secondary | ICD-10-CM

## 2014-08-27 DIAGNOSIS — E113419 Type 2 diabetes mellitus with severe nonproliferative diabetic retinopathy with macular edema, unspecified eye: Secondary | ICD-10-CM

## 2014-08-27 DIAGNOSIS — F329 Major depressive disorder, single episode, unspecified: Secondary | ICD-10-CM

## 2014-08-27 DIAGNOSIS — H547 Unspecified visual loss: Secondary | ICD-10-CM

## 2014-08-27 DIAGNOSIS — I10 Essential (primary) hypertension: Secondary | ICD-10-CM

## 2014-08-27 NOTE — Assessment & Plan Note (Signed)
Controlled, takes Metoprolol 50mg daily and Amlodipine 10mg daily.    

## 2014-08-27 NOTE — Assessment & Plan Note (Signed)
takes Metformin 500mg  bid, 01/16/14 Hgb A1c 6.5. 03/24/14 Hgb A1c 6.4

## 2014-08-27 NOTE — Assessment & Plan Note (Signed)
Rate controlled 

## 2014-08-27 NOTE — Assessment & Plan Note (Signed)
Stable, continue MiraLax daily. Dulcolax, Fleet, and MOM prn available to her.     

## 2014-08-27 NOTE — Assessment & Plan Note (Signed)
Takes Levothyroxine 12.5mcg, 01/16/14 TSH 2.474. 03/24/14 TSH 2.576  

## 2014-08-27 NOTE — Assessment & Plan Note (Signed)
Better with Oxybutynin 5mg daily.    

## 2014-08-27 NOTE — Assessment & Plan Note (Signed)
04/05/14 her appetite was reportedly poor-weights #116Ibs 04/02/14 and # 117Ibds 04/09/14. Stabilized since  Mirtazapine 7.5 mg since the patient c/o feeling depressed per SW 04/08/14( feeling depressed, loss of interests, restlessness, and feeling like she just wants out of here. Noted flat affect. Said she felt she soul be better off dead but would not hurt herself) 06/30/14 stable, adjusting to [facility] well.  --stable.

## 2014-08-27 NOTE — Progress Notes (Signed)
Patient ID: [name]. Janvier, female   DOB: April 08, 1915, 81 y.o.   MRN: [id number]   Code Status: DNR  Allergies  Allergen Reactions  . Codeine     unknown  . Ibuprofen     unknown  . Lisinopril     unknown  . Penicillins     unknown  . Philis Nettle [Trospium Chloride]     unknown    Chief Complaint  Patient presents with  . Medical Management of Chronic Issues    HPI: Patient is a 81 y.o. female seen in the SNF at [facility] today for evaluation of chronic medical conditions.  Problem List Items Addressed This Visit    Type 2 diabetes mellitus with blindness, with macular edema, with severe nonproliferative retinopathy    takes Metformin  bid, 01/16/14 Hgb A1c 6.5. 03/24/14 Hgb A1c 6.4        Hypothyroidism    Takes Levothyroxine 12.37mcg, 01/16/14 TSH 2.474. 03/24/14 TSH 2.576        Hypertonicity of bladder    Better with Oxybutynin  daily.        Essential hypertension    Controlled, takes Metoprolol  daily and Amlodipine  daily.         Depression    04/05/14 her appetite was reportedly poor-weights #116Ibs 04/02/14 and # 117Ibds 04/09/14. Stabilized since  Mirtazapine 7.5 mg since the patient c/o feeling depressed per SW 04/08/14( feeling depressed, loss of interests, restlessness, and feeling like she just wants out of here. Noted flat affect. Said she felt she soul be better off dead but would not hurt herself) 06/30/14 stable, adjusting to [facility] well.  --stable.         Dementia    04/03/14 MMSE 22/30. May consider memory preserving meds if no better after adjusting to SNF and better managing mood.         Constipation    Stable, continue MiraLax daily. Dulcolax, Fleet, and MOM prn available to her.         ATRIAL FIBRILLATION - Primary    Rate controlled.            Review of Systems:  Review of Systems  Constitutional: Negative for fever, chills, weight loss, malaise/fatigue and diaphoresis.  HENT: Positive for hearing loss.  Negative for congestion, ear discharge, ear pain, nosebleeds, sore throat and tinnitus.   Eyes: Negative for blurred vision, double vision, photophobia, pain, discharge and redness.       Legally blind  Respiratory: Negative for cough, hemoptysis, sputum production, shortness of breath, wheezing and stridor.   Cardiovascular: Negative for chest pain, palpitations, orthopnea, claudication, leg swelling and PND.  Gastrointestinal: Negative for heartburn, nausea, vomiting, abdominal pain, diarrhea, constipation, blood in stool and melena.  Genitourinary: Positive for frequency. Negative for dysuria, urgency, hematuria and flank pain.  Musculoskeletal: Negative for myalgias, back pain, joint pain, falls and neck pain.  Neurological: Negative for dizziness, tingling, tremors, sensory change, speech change, focal weakness, seizures, loss of consciousness, weakness and headaches.  Endo/Heme/Allergies: Negative for environmental allergies and polydipsia. Does not bruise/bleed easily.  Psychiatric/Behavioral: Positive for depression and memory loss. Negative for suicidal ideas, hallucinations and substance abuse. The patient is nervous/anxious. The patient does not have insomnia.      Past Medical History  Diagnosis Date  . HTN (hypertension)   . Other and unspecified hyperlipidemia   . DM (diabetes mellitus)   . Hyperthyroidism   . Hemorrhoids   . AF (atrial fibrillation)   .  Rectal bleeding   . Sinus node dysfunction   . Hip fracture   . Bradycardia   . Aortic stenosis   . Mitral valve prolapse   . Hearing loss   . Memory loss 02/16/2012  . Disturbance of salivary secretion [id number]  . Unspecified hereditary and idiopathic peripheral neuropathy 10/28/202012  . Closed fracture of unspecified trochanteric section of femur 05/29/2011  . Anxiety state, unspecified 08/19/2010  . Diffuse cystic mastopathy 12/23/2009  . Flatulence, eructation, and gas pain 12/03/2009  . Closed fracture of lumbar  vertebra without mention of spinal cord injury [id number]  . Personal history of fall 01/29/2009  . Occlusion and stenosis of carotid artery without mention of cerebral infarction 12/27/2007  . Syncope and collapse 12/27/2007  . Other malaise and fatigue 12/06/2007  . Unspecified urinary incontinence 02/22/2005  . Cardiac pacemaker in situ 09/12/2004  . Unspecified hypothyroidism 03/18/2010  . Abnormality of gait 09/08/2003  . Edema 08/18/2003  . Macular degeneration (senile) of retina, unspecified 12/12/2001  . Undiagnosed cardiac murmurs 04/11/2001  . Senile osteoporosis 12/13/2000  . Legal blindness, as defined in [place] 03/20/1999    secondary to macular degeneration  . Fracture of greater trochanter of left femur 03/08/14   Past Surgical History  Procedure Laterality Date  . Pacemaker insertion  2006    [employer] In-Pulse  . Left hip surgery  09/2005  . Right hip surgery  08/2003  . 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 Tonsillectomy    . Breast biopsy Left 1970    benign   Social History:   reports that she has never smoked. She has never used smokeless tobacco. She reports that she does not drink alcohol or use illicit drugs.  Family History  Problem Relation Age of Onset  . Colon cancer Mother   . Cancer Mother     colon  . Heart attack Father   . Heart disease Father     MI  . Heart disease Sister     MI    Medications: Patient's Medications  New Prescriptions   No medications on file  Previous Medications   ACETAMINOPHEN (TYLENOL) 325 MG TABLET    Take 650 mg by mouth every 4 (four) hours as needed for mild pain or moderate pain.    AMLODIPINE (NORVASC) 10 MG TABLET    Take 10 mg by mouth Daily.   BISACODYL (DULCOLAX) 10 MG SUPPOSITORY    Place 1 suppository (10 mg total) rectally daily as needed for moderate constipation.   CALCIUM-VITAMIN D (OSCAL WITH D) 500-200 MG-UNIT PER TABLET    Take 1 tablet by  mouth 2 (two) times daily.   ENOXAPARIN (LOVENOX) 40 MG/0.4ML INJECTION    Inject 0.4 mLs (40 mg total) into the skin daily. For 3 weeks   HYDROCODONE-ACETAMINOPHEN (NORCO/VICODIN) 5-325 MG PER TABLET    Take 1-2 tablets by mouth every 6 (six) hours as needed for moderate pain.   LECITHIN 1200 MG CAPS    Take 1,200 mg by mouth daily.    LEVOTHYROXINE (SYNTHROID, LEVOTHROID) 25 MCG TABLET    Take 12.5 mg by mouth Daily.   MAGNESIUM HYDROXIDE (MILK OF MAGNESIA) 400 MG/5ML SUSPENSION    Take 30 mLs by mouth every other day. Every other day as needed for constipation   METFORMIN (GLUCOPHAGE) 500 MG TABLET    2 (two) times daily.   METOPROLOL SUCCINATE (TOPROL-XL) 50 MG 24 HR TABLET  Take 50 mg by mouth Daily.   MIRTAZAPINE (REMERON) 7.5 MG TABLET    Take 7.5 mg by mouth at bedtime.   MULTIPLE VITAMINS-MINERALS (PRESERVISION AREDS PO)    Take 1 tablet by mouth 2 (two) times daily.    OXYBUTYNIN (DITROPAN-XL) 5 MG 24 HR TABLET    Take 5 mg by mouth Daily.   POLYETHYLENE GLYCOL POWDER (GLYCOLAX/MIRALAX) POWDER    Take 17 g by mouth daily.   SIMVASTATIN (ZOCOR) 10 MG TABLET    Take 10 mg by mouth Daily.   SODIUM PHOSPHATE (FLEET) 7-19 GM/118ML ENEM    Place 133 mLs (1 enema total) rectally daily as needed for severe constipation.  Modified Medications   No medications on file  Discontinued Medications   No medications on file     Physical Exam: Physical Exam  Constitutional: She is oriented to person, place, and time. She appears well-developed and well-nourished.  HENT:  Head: Normocephalic and atraumatic.  Right Ear: External ear normal.  Left Ear: External ear normal.  Nose: Nose normal.  Mouth/Throat: Oropharynx is clear and moist. No oropharyngeal exudate.  Eyes: Conjunctivae and EOM are normal. Pupils are equal, round, and reactive to light. Right eye exhibits no discharge. Left eye exhibits no discharge. No scleral icterus.  Neck: Normal range of motion. Neck supple. No JVD present.  No thyromegaly present.  Cardiovascular: Normal rate.   Murmur heard. Pacemaker. Systolic ejection murmur 3/6  Pulmonary/Chest: Effort normal and breath sounds normal. No respiratory distress. She has no wheezes. She has no rales. She exhibits no tenderness.  Abdominal: Soft. Bowel sounds are normal. She exhibits no distension. There is no tenderness. There is no rebound.  Musculoskeletal: Normal range of motion. She exhibits no edema or tenderness.  Lymphadenopathy:    She has no cervical adenopathy.  Neurological: She is alert and oriented to person, place, and time. She has normal reflexes. No cranial nerve deficit. She exhibits normal muscle tone. Coordination normal.  Skin: Skin is warm and dry. No rash noted. She is not diaphoretic. No erythema. No pallor.  Psychiatric: Thought content normal. Her mood appears not anxious. Her affect is not angry, not blunt, not labile and not inappropriate. Her speech is not rapid and/or pressured, not delayed and not slurred. She is slowed. She is not agitated, not aggressive, not hyperactive, not withdrawn, not actively hallucinating and not combative. Thought content is not paranoid and not delusional. Cognition and memory are impaired. She expresses impulsivity and inappropriate judgment. She exhibits a depressed mood. She exhibits abnormal recent memory.  Flat affect. Confusion-going to bed before dinner frequently.  She is attentive.    Filed Vitals:   08/27/14 1535  BP: 122/78  Pulse: 60  Temp: 96.8 F (36 C)  TempSrc: Tympanic  Resp: 20      Labs reviewed: Basic Metabolic Panel:  Recent Labs  16/10/96 03/08/14 0849 03/09/14 0425 03/24/14  NA  --  138 141 140  K  --  3.9 3.9 4.2  CL  --  100 103  --   CO2  --  27 24  --   GLUCOSE  --  152* 110*  --   BUN  --  17 19 22*  CREATININE  --  0.84 0.75 0.9  CALCIUM  --  9.8 9.1  --   TSH 2.47  --   --  2.58   Liver Function Tests:  Recent Labs  03/24/14  AST 18  ALT 9  ALKPHOS  198*  No results for input(s): LIPASE, AMYLASE in the last 8760 hours. No results for input(s): AMMONIA in the last 8760 hours. CBC:  Recent Labs  03/08/14 0849 03/09/14 0425 03/24/14  WBC 11.8* 10.7* 8.5  HGB 13.6 12.9 14.0  HCT 39.6 37.6 40  MCV 86.1 87.2  --   PLT 206 207 386   Lipid Panel: No results for input(s): CHOL, HDL, LDLCALC, TRIG, CHOLHDL, LDLDIRECT in the last 8760 hours.  Past Procedures:  Past Procedure:  03/06/14 X-ray pelvis([facility]) no acute osseous abnormality. Chronic degenerative and postsurgical findings(s/p R hip arthroplasty and left femoral neck compression screw with associated proximal femoral sideplate and fixation screws-no hardware loosening or migration visible at imaged components. R inferior ischial and inferior right pubic ramus osseous deformities consistent with previously identified remote fracture injuries)   03/08/14 X-ray lumbar spine:  IMPRESSION: 1. Chronic wedge compression fractures. 2.  No evidence for acute  abnormality.  03/08/14 CXR  IMPRESSION: Low lung volumes.  No active disease.  03/08/14 X-ray pelvis  IMPRESSION: 1. Postoperative changes. 2.  No evidence for acute  abnormality.  03/08/14 CT head w/o contrast:  IMPRESSION: 1. Atrophy and small vessel disease. 2.  No evidence for acute intracranial abnormality. 3. Right posterior parietal scalp hematoma without underlying calvarial fracture. 4. Sinusitis involving the right sphenoid air cell. No evidence for sinus wall fracture   03/08/14 CT pelvis and abd w/o contrast:  IMPRESSION: 1. Nondisplaced fracture along the left greater trochanter is new since prior exam. It is unclear if this is acute or chronic injury. 2. Postoperative changes of both hips. 3. Remote L5 compression fracture. 4. Extensive atherosclerotic changes including coronary artery disease. 5. Small hiatal hernia. 6. No acute intra-abdominal lesion to explain the  patient's symptoms.    Assessment/Plan ATRIAL FIBRILLATION Rate controlled.      Constipation Stable, continue MiraLax daily. Dulcolax, Fleet, and MOM prn available to her.      Dementia 04/03/14 MMSE 22/30. May consider memory preserving meds if no better after adjusting to SNF and better managing mood.      Depression 04/05/14 her appetite was reportedly poor-weights #116Ibs 04/02/14 and # 117Ibds 04/09/14. Stabilized since  Mirtazapine 7.5 mg since the patient c/o feeling depressed per SW 04/08/14( feeling depressed, loss of interests, restlessness, and feeling like she just wants out of here. Noted flat affect. Said she felt she soul be better off dead but would not hurt herself) 06/30/14 stable, adjusting to [facility] well.  --stable.      Essential hypertension Controlled, takes Metoprolol 50mg  daily and Amlodipine 10mg  daily.      Hypertonicity of bladder Better with Oxybutynin 5mg  daily.     Hypothyroidism Takes Levothyroxine 12.305mcg, 01/16/14 TSH 2.474. 03/24/14 TSH 2.576     Type 2 diabetes mellitus with blindness, with macular edema, with severe nonproliferative retinopathy takes Metformin 500mg  bid, 01/16/14 Hgb A1c 6.5. 03/24/14 Hgb A1c 6.4       Family/ Staff Communication: observe the patient  Goals of Care: SNF  Labs/tests ordered: none

## 2014-08-27 NOTE — Assessment & Plan Note (Signed)
04/03/14 MMSE 22/30. May consider memory preserving meds if no better after adjusting to SNF and better managing mood.   

## 2014-09-01 NOTE — Telephone Encounter (Signed)
[name] I updated [name] pt is not dependent. Pt was implanted for sinus bradycardia---pt has underlying in the 40s. I explained how pt might feel since device reverted to VVI/65.  Pt now in memory care facility. Dementia has "taken a nose dive." Pt feels weaker since ERI. She now frequently sits in a chair without energy to talk. She noticeably doesn't feel well per family.   Per [name], [name] will likely not replace device due to pt previously stating her wishes not to do so. Family will call back to update us about the final decision. [name] is aware that pt's pulse rate will remain at 65 until EOL which will intermittently show rates in the 40s (assuming bradycardia has not worsened).   Noted in [employer].

## 2014-09-01 NOTE — Telephone Encounter (Deleted)
[name]'s cell# not on file. [name] gave me [name]'s #.

## 2014-09-24 ENCOUNTER — Encounter: Payer: Self-pay | Admitting: Nurse Practitioner

## 2014-09-24 ENCOUNTER — Non-Acute Institutional Stay (SKILLED_NURSING_FACILITY): Payer: 59 | Admitting: Nurse Practitioner

## 2014-09-24 DIAGNOSIS — F329 Major depressive disorder, single episode, unspecified: Secondary | ICD-10-CM

## 2014-09-24 DIAGNOSIS — H540 Blindness, both eyes: Secondary | ICD-10-CM

## 2014-09-24 DIAGNOSIS — H547 Unspecified visual loss: Secondary | ICD-10-CM

## 2014-09-24 DIAGNOSIS — E113419 Type 2 diabetes mellitus with severe nonproliferative diabetic retinopathy with macular edema, unspecified eye: Secondary | ICD-10-CM

## 2014-09-24 DIAGNOSIS — I10 Essential (primary) hypertension: Secondary | ICD-10-CM | POA: Diagnosis not present

## 2014-09-24 DIAGNOSIS — K5900 Constipation, unspecified: Secondary | ICD-10-CM

## 2014-09-24 DIAGNOSIS — E11341 Type 2 diabetes mellitus with severe nonproliferative diabetic retinopathy with macular edema: Secondary | ICD-10-CM | POA: Diagnosis not present

## 2014-09-24 DIAGNOSIS — F32A Depression, unspecified: Secondary | ICD-10-CM

## 2014-09-24 DIAGNOSIS — E039 Hypothyroidism, unspecified: Secondary | ICD-10-CM

## 2014-09-24 NOTE — Assessment & Plan Note (Signed)
Controlled, takes Metoprolol 50mg daily and Amlodipine 10mg daily.    

## 2014-09-24 NOTE — Assessment & Plan Note (Signed)
04/05/14 her appetite was reportedly poor-weights #116Ibs 04/02/14 and # 117Ibds 04/09/14. Stabilized since  Mirtazapine 7.5 mg since the patient c/o feeling depressed per SW 04/08/14( feeling depressed, loss of interests, restlessness, and feeling like she just wants out of here. Noted flat affect. Said she felt she soul be better off dead but would not hurt herself) 06/30/14 stable, adjusting to [facility] well.  --stable.

## 2014-09-24 NOTE — Assessment & Plan Note (Signed)
Takes Levothyroxine 12.5mcg, 01/16/14 TSH 2.474. 03/24/14 TSH 2.576  

## 2014-09-24 NOTE — Assessment & Plan Note (Signed)
Stable, continue MiraLax daily. Dulcolax, Fleet, and MOM prn available to her.     

## 2014-09-24 NOTE — Assessment & Plan Note (Signed)
takes Metformin 500mg  bid, 01/16/14 Hgb A1c 6.5. 03/24/14 Hgb A1c 6.4

## 2014-09-24 NOTE — Progress Notes (Signed)
Patient ID: [name]. Kobler, female   DOB: [birth year], 81 y.o.   MRN: [id number]   Code Status: DNR  Allergies  Allergen Reactions  . Codeine     unknown  . Ibuprofen     unknown  . Lisinopril     unknown  . Penicillins     unknown  . Philis Nettle [Trospium Chloride]     unknown    Chief Complaint  Patient presents with  . Medical Management of Chronic Issues    HPI: Patient is a 81 y.o. female seen in the SNF at [facility] today for evaluation of chronic medical conditions.  Problem List Items Addressed This Visit    Type 2 diabetes mellitus with blindness, with macular edema, with severe nonproliferative retinopathy    takes Metformin  bid, 01/16/14 Hgb A1c 6.5. 03/24/14 Hgb A1c 6.4       Hypothyroidism    Takes Levothyroxine 12.41mcg, 01/16/14 TSH 2.474. 03/24/14 TSH 2.576      Essential hypertension - Primary    Controlled, takes Metoprolol  daily and Amlodipine  daily.       Depression    04/05/14 her appetite was reportedly poor-weights #116Ibs 04/02/14 and # 117Ibds 04/09/14. Stabilized since  Mirtazapine 7.5 mg since the patient c/o feeling depressed per SW 04/08/14( feeling depressed, loss of interests, restlessness, and feeling like she just wants out of here. Noted flat affect. Said she felt she soul be better off dead but would not hurt herself) 06/30/14 stable, adjusting to [facility] well.  --stable.        Constipation    Stable, continue MiraLax daily. Dulcolax, Fleet, and MOM prn available to her.             Review of Systems:  Review of Systems  Constitutional: Negative for fever, chills, weight loss, malaise/fatigue and diaphoresis.  HENT: Positive for hearing loss. Negative for congestion, ear discharge, ear pain, nosebleeds, sore throat and tinnitus.   Eyes: Negative for blurred vision, double vision, photophobia, pain, discharge and redness.       Low vision  Respiratory: Negative for cough, hemoptysis, sputum production, shortness of  breath, wheezing and stridor.   Cardiovascular: Negative for chest pain, palpitations, orthopnea, claudication, leg swelling and PND.  Gastrointestinal: Negative for heartburn, nausea, vomiting, abdominal pain, diarrhea, constipation, blood in stool and melena.  Genitourinary: Positive for frequency. Negative for dysuria, urgency, hematuria and flank pain.  Musculoskeletal: Negative for myalgias, back pain, joint pain, falls and neck pain.  Skin: Negative for itching and rash.  Neurological: Negative for dizziness, tingling, tremors, sensory change, speech change, focal weakness, seizures, loss of consciousness, weakness and headaches.  Endo/Heme/Allergies: Negative for environmental allergies and polydipsia. Does not bruise/bleed easily.  Psychiatric/Behavioral: Positive for depression and memory loss. Negative for suicidal ideas, hallucinations and substance abuse. The patient is nervous/anxious. The patient does not have insomnia.      Past Medical History  Diagnosis Date  . HTN (hypertension)   . Other and unspecified hyperlipidemia   . DM (diabetes mellitus)   . Hyperthyroidism   . Hemorrhoids   . AF (atrial fibrillation)   . Rectal bleeding   . Sinus node dysfunction   . Hip fracture   . Bradycardia   . Aortic stenosis   . Mitral valve prolapse   . Hearing loss   . Memory loss 02/16/2012  . Disturbance of salivary secretion [id number]  . Unspecified hereditary and idiopathic peripheral neuropathy 10/28/202012  . Closed fracture of unspecified trochanteric  section of femur 05/29/2011  . Anxiety state, unspecified 08/19/2010  . Diffuse cystic mastopathy 12/23/2009  . Flatulence, eructation, and gas pain 12/03/2009  . Closed fracture of lumbar vertebra without mention of spinal cord injury 12020/01/2109  . Personal history of fall 01/29/2009  . Occlusion and stenosis of carotid artery without mention of cerebral infarction 12/27/2007  . Syncope and collapse 12/27/2007  . Other malaise and  fatigue 12/06/2007  . Unspecified urinary incontinence 02/22/2005  . Cardiac pacemaker in situ 09/12/2004  . Unspecified hypothyroidism 03/18/2010  . Abnormality of gait 09/08/2003  . Edema 08/18/2003  . Macular degeneration (senile) of retina, unspecified 12/12/2001  . Undiagnosed cardiac murmurs 04/11/2001  . Senile osteoporosis 12/13/2000  . Legal blindness, as defined in [place] 03/20/1999    secondary to macular degeneration  . Fracture of greater trochanter of left femur 03/08/14   Past Surgical History  Procedure Laterality Date  . Pacemaker insertion  2006    [employer] In-Pulse  . Left hip surgery  09/2005  . Right hip surgery  08/2003  . 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 Tonsillectomy    . Breast biopsy Left 1970    benign   Social History:   reports that she has never smoked. She has never used smokeless tobacco. She reports that she does not drink alcohol or use illicit drugs.  Family History  Problem Relation Age of Onset  . Colon cancer Mother   . Cancer Mother     colon  . Heart attack Father   . Heart disease Father     MI  . Heart disease Sister     MI    Medications: Patient's Medications  New Prescriptions   No medications on file  Previous Medications   ACETAMINOPHEN (TYLENOL) 325 MG TABLET    Take 650 mg by mouth every 4 (four) hours as needed for mild pain or moderate pain.    AMLODIPINE (NORVASC) 10 MG TABLET    Take 10 mg by mouth Daily.   BISACODYL (DULCOLAX) 10 MG SUPPOSITORY    Place 1 suppository (10 mg total) rectally daily as needed for moderate constipation.   HYDROCODONE-ACETAMINOPHEN (NORCO/VICODIN) 5-325 MG PER TABLET    Take 1-2 tablets by mouth every 6 (six) hours as needed for moderate pain.   LEVOTHYROXINE (SYNTHROID, LEVOTHROID) 25 MCG TABLET    Take 12.5 mg by mouth Daily.   MAGNESIUM HYDROXIDE (MILK OF MAGNESIA) 400 MG/5ML SUSPENSION    Take 30 mLs by mouth every other  day. Every other day as needed for constipation   METFORMIN (GLUCOPHAGE) 500 MG TABLET    2 (two) times daily.   METOPROLOL SUCCINATE (TOPROL-XL) 50 MG 24 HR TABLET    Take 50 mg by mouth Daily.   MIRTAZAPINE (REMERON) 7.5 MG TABLET    Take 7.5 mg by mouth at bedtime.   POLYETHYLENE GLYCOL POWDER (GLYCOLAX/MIRALAX) POWDER    Take 17 g by mouth daily.   SODIUM PHOSPHATE (FLEET) 7-19 GM/118ML ENEM    Place 133 mLs (1 enema total) rectally daily as needed for severe constipation.  Modified Medications   No medications on file  Discontinued Medications   CALCIUM-VITAMIN D (OSCAL WITH D) 500-200 MG-UNIT PER TABLET    Take 1 tablet by mouth 2 (two) times daily.   ENOXAPARIN (LOVENOX) 40 MG/0.4ML INJECTION    Inject 0.4 mLs (40 mg total) into the skin daily. For 3 weeks  LECITHIN 1200 MG CAPS    Take 1,200 mg by mouth daily.    MULTIPLE VITAMINS-MINERALS (PRESERVISION AREDS PO)    Take 1 tablet by mouth 2 (two) times daily.    OXYBUTYNIN (DITROPAN-XL) 5 MG 24 HR TABLET    Take 5 mg by mouth Daily.   SIMVASTATIN (ZOCOR) 10 MG TABLET    Take 10 mg by mouth Daily.     Physical Exam: Physical Exam  Constitutional: She is oriented to person, place, and time. She appears well-developed and well-nourished.  HENT:  Head: Normocephalic and atraumatic.  Right Ear: External ear normal.  Left Ear: External ear normal.  Nose: Nose normal.  Mouth/Throat: Oropharynx is clear and moist. No oropharyngeal exudate.  Eyes: Conjunctivae and EOM are normal. Pupils are equal, round, and reactive to light. Right eye exhibits no discharge. Left eye exhibits no discharge. No scleral icterus.  Neck: Normal range of motion. Neck supple. No JVD present. No thyromegaly present.  Cardiovascular: Normal rate.   Murmur heard. Pacemaker. Systolic ejection murmur 3/6  Pulmonary/Chest: Effort normal and breath sounds normal. No respiratory distress. She has no wheezes. She has no rales. She exhibits no tenderness.    Abdominal: Soft. Bowel sounds are normal. She exhibits no distension. There is no tenderness. There is no rebound.  Musculoskeletal: Normal range of motion. She exhibits no edema or tenderness.  Lymphadenopathy:    She has no cervical adenopathy.  Neurological: She is alert and oriented to person, place, and time. She has normal reflexes. No cranial nerve deficit. She exhibits normal muscle tone. Coordination normal.  Skin: Skin is warm and dry. No rash noted. She is not diaphoretic. No erythema. No pallor.  Psychiatric: Thought content normal. Her mood appears not anxious. Her affect is not angry, not blunt, not labile and not inappropriate. Her speech is not rapid and/or pressured, not delayed and not slurred. She is slowed. She is not agitated, not aggressive, not hyperactive, not withdrawn, not actively hallucinating and not combative. Thought content is not paranoid and not delusional. Cognition and memory are impaired. She expresses impulsivity and inappropriate judgment. She exhibits a depressed mood. She exhibits abnormal recent memory.  Flat affect. Confusion-going to bed before dinner frequently.  She is attentive.    Filed Vitals:   09/29/14 1107  BP: 110/54  Pulse: 76  Temp: 97.9 F (36.6 C)  TempSrc: Tympanic  Resp: 16      Labs reviewed: Basic Metabolic Panel:  Recent Labs  16/05/9605/18/15 03/08/14 0849 03/09/14 0425 03/24/14  NA  --  138 141 140  K  --  3.9 3.9 4.2  CL  --  100 103  --   CO2  --  27 24  --   GLUCOSE  --  152* 110*  --   BUN  --  17 19 22*  CREATININE  --  0.84 0.75 0.9  CALCIUM  --  9.8 9.1  --   TSH 2.47  --   --  2.58   Liver Function Tests:  Recent Labs  03/24/14  AST 18  ALT 9  ALKPHOS 198*   No results for input(s): LIPASE, AMYLASE in the last 8760 hours. No results for input(s): AMMONIA in the last 8760 hours. CBC:  Recent Labs  03/08/14 0849 03/09/14 0425 03/24/14  WBC 11.8* 10.7* 8.5  HGB 13.6 12.9 14.0  HCT 39.6 37.6 40   MCV 86.1 87.2  --   PLT 206 207 386   Lipid Panel: No results  for input(s): CHOL, HDL, LDLCALC, TRIG, CHOLHDL, LDLDIRECT in the last 8760 hours.  Past Procedures:  Past Procedure:  03/06/14 X-ray pelvis([facility]) no acute osseous abnormality. Chronic degenerative and postsurgical findings(s/p R hip arthroplasty and left femoral neck compression screw with associated proximal femoral sideplate and fixation screws-no hardware loosening or migration visible at imaged components. R inferior ischial and inferior right pubic ramus osseous deformities consistent with previously identified remote fracture injuries)   03/08/14 X-ray lumbar spine:  IMPRESSION: 1. Chronic wedge compression fractures. 2.  No evidence for acute  abnormality.  03/08/14 CXR  IMPRESSION: Low lung volumes.  No active disease.  03/08/14 X-ray pelvis  IMPRESSION: 1. Postoperative changes. 2.  No evidence for acute  abnormality.  03/08/14 CT head w/o contrast:  IMPRESSION: 1. Atrophy and small vessel disease. 2.  No evidence for acute intracranial abnormality. 3. Right posterior parietal scalp hematoma without underlying calvarial fracture. 4. Sinusitis involving the right sphenoid air cell. No evidence for sinus wall fracture   03/08/14 CT pelvis and abd w/o contrast:  IMPRESSION: 1. Nondisplaced fracture along the left greater trochanter is new since prior exam. It is unclear if this is acute or chronic injury. 2. Postoperative changes of both hips. 3. Remote L5 compression fracture. 4. Extensive atherosclerotic changes including coronary artery disease. 5. Small hiatal hernia. 6. No acute intra-abdominal lesion to explain the patient's symptoms.    Assessment/Plan Hypothyroidism Takes Levothyroxine 12.68mcg, 01/16/14 TSH 2.474. 03/24/14 TSH 2.576   Type 2 diabetes mellitus with blindness, with macular edema, with severe nonproliferative retinopathy takes Metformin 500mg  bid, 01/16/14 Hgb A1c 6.5. 03/24/14 Hgb  A1c 6.4    Essential hypertension Controlled, takes Metoprolol 50mg  daily and Amlodipine 10mg  daily.    Constipation Stable, continue MiraLax daily. Dulcolax, Fleet, and MOM prn available to her.       Depression 04/05/14 her appetite was reportedly poor-weights #116Ibs 04/02/14 and # 117Ibds 04/09/14. Stabilized since  Mirtazapine 7.5 mg since the patient c/o feeling depressed per SW 04/08/14( feeling depressed, loss of interests, restlessness, and feeling like she just wants out of here. Noted flat affect. Said she felt she soul be better off dead but would not hurt herself) 06/30/14 stable, adjusting to [facility] well.  --stable.       Family/ Staff Communication: observe the patient  Goals of Care: SNF  Labs/tests ordered: none

## 2014-10-13 ENCOUNTER — Encounter: Payer: Self-pay | Admitting: Nurse Practitioner

## 2014-10-13 ENCOUNTER — Non-Acute Institutional Stay (SKILLED_NURSING_FACILITY): Payer: 59 | Admitting: Nurse Practitioner

## 2014-10-13 DIAGNOSIS — I10 Essential (primary) hypertension: Secondary | ICD-10-CM

## 2014-10-13 DIAGNOSIS — E039 Hypothyroidism, unspecified: Secondary | ICD-10-CM

## 2014-10-13 DIAGNOSIS — F329 Major depressive disorder, single episode, unspecified: Secondary | ICD-10-CM

## 2014-10-13 DIAGNOSIS — K5900 Constipation, unspecified: Secondary | ICD-10-CM | POA: Diagnosis not present

## 2014-10-13 DIAGNOSIS — H540 Blindness, both eyes: Secondary | ICD-10-CM

## 2014-10-13 DIAGNOSIS — E11341 Type 2 diabetes mellitus with severe nonproliferative diabetic retinopathy with macular edema: Secondary | ICD-10-CM

## 2014-10-13 DIAGNOSIS — H547 Unspecified visual loss: Secondary | ICD-10-CM

## 2014-10-13 DIAGNOSIS — F0390 Unspecified dementia without behavioral disturbance: Secondary | ICD-10-CM

## 2014-10-13 DIAGNOSIS — I480 Paroxysmal atrial fibrillation: Secondary | ICD-10-CM

## 2014-10-13 DIAGNOSIS — F32A Depression, unspecified: Secondary | ICD-10-CM

## 2014-10-13 DIAGNOSIS — R634 Abnormal weight loss: Secondary | ICD-10-CM

## 2014-10-13 DIAGNOSIS — N318 Other neuromuscular dysfunction of bladder: Secondary | ICD-10-CM

## 2014-10-13 DIAGNOSIS — E113419 Type 2 diabetes mellitus with severe nonproliferative diabetic retinopathy with macular edema, unspecified eye: Secondary | ICD-10-CM

## 2014-10-13 HISTORY — DX: Abnormal weight loss: R63.4

## 2014-10-13 NOTE — Assessment & Plan Note (Signed)
04/05/14 her appetite was reportedly poor-weights #116Ibs 04/02/14 and # 117Ibds 04/09/14. Stabilized since  Mirtazapine 7.5 mg since the patient c/o feeling depressed per SW 04/08/14( feeling depressed, loss of interests, restlessness, and feeling like she just wants out of here. Noted flat affect. Said she felt she soul be better off dead but would not hurt herself) 06/30/14 stable, adjusting to [facility] well.  10/13/14 flat affect, reportedly poor appetite and continuously gradually losing weight, will increase Mirtazapine to 15mg  for better mood/weight management.

## 2014-10-13 NOTE — Assessment & Plan Note (Signed)
takes Metformin 500mg  bid, 01/16/14 Hgb A1c 6.5. 03/24/14 Hgb A1c 6.4

## 2014-10-13 NOTE — Assessment & Plan Note (Signed)
Better with Oxybutynin 5mg daily.    

## 2014-10-13 NOTE — Assessment & Plan Note (Signed)
04/03/14 MMSE 22/30. May consider memory preserving meds if no better after adjusting to SNF and better managing mood.   

## 2014-10-13 NOTE — Progress Notes (Signed)
Patient ID: [name]. Moger, female   DOB: Nov 07, 1914, 81 y.o.   MRN: [id number]   Code Status: DNR  Allergies  Allergen Reactions  . Codeine     unknown  . Ibuprofen     unknown  . Lisinopril     unknown  . Penicillins     unknown  . Philis Nettle [Trospium Chloride]     unknown    Chief Complaint  Patient presents with  . Medical Management of Chronic Issues  . Acute Visit    loss of weight    HPI: Patient is a 81 y.o. female seen in the SNF at [facility] today for evaluation of  Loss of weight and other chronic medical conditions.  Problem List Items Addressed This Visit    Type 2 diabetes mellitus with blindness, with macular edema, with severe nonproliferative retinopathy    takes Metformin  bid, 01/16/14 Hgb A1c 6.5. 03/24/14 Hgb A1c 6.4        Loss of weight   Hypothyroidism    Takes Levothyroxine 12.43mcg, 01/16/14 TSH 2.474. 03/24/14 TSH 2.576       Hypertonicity of bladder    Better with Oxybutynin  daily.         Essential hypertension    Controlled, takes Metoprolol  daily and Amlodipine  daily.        Depression    04/05/14 her appetite was reportedly poor-weights #116Ibs 04/02/14 and # 117Ibds 04/09/14. Stabilized since  Mirtazapine 7.5 mg since the patient c/o feeling depressed per SW 04/08/14( feeling depressed, loss of interests, restlessness, and feeling like she just wants out of here. Noted flat affect. Said she felt she soul be better off dead but would not hurt herself) 06/30/14 stable, adjusting to [facility] well.  10/13/14 flat affect, reportedly poor appetite and continuously gradually losing weight, will increase Mirtazapine to  for better mood/weight management.        Dementia    04/03/14 MMSE 22/30. May consider memory preserving meds if no better after adjusting to SNF and better managing mood.          Constipation    Stable, continue MiraLax daily. Dulcolax, Fleet, and MOM prn available to her.           ATRIAL  FIBRILLATION - Primary    Rate controlled.             Review of Systems:  Review of Systems  Constitutional: Positive for unexpected weight change. Negative for fever, chills and diaphoresis.  HENT: Positive for hearing loss. Negative for congestion, ear discharge, ear pain, nosebleeds, sore throat and tinnitus.   Eyes: Negative for photophobia, pain, discharge and redness.       Low vision  Respiratory: Negative for cough, shortness of breath, wheezing and stridor.   Cardiovascular: Negative for chest pain, palpitations and leg swelling.  Gastrointestinal: Negative for nausea, vomiting, abdominal pain, diarrhea, constipation and blood in stool.  Endocrine: Negative for polydipsia.  Genitourinary: Positive for frequency. Negative for dysuria, urgency, hematuria and flank pain.  Musculoskeletal: Negative for myalgias, back pain and neck pain.  Skin: Negative for rash.  Allergic/Immunologic: Negative for environmental allergies.  Neurological: Negative for dizziness, tremors, seizures, weakness and headaches.  Hematological: Does not bruise/bleed easily.  Psychiatric/Behavioral: Negative for suicidal ideas and hallucinations. The patient is nervous/anxious.      Past Medical History  Diagnosis Date  . HTN (hypertension)   . Other and unspecified hyperlipidemia   . DM (diabetes mellitus)   .  Hyperthyroidism   . Hemorrhoids   . AF (atrial fibrillation)   . Rectal bleeding   . Sinus node dysfunction   . Hip fracture   . Bradycardia   . Aortic stenosis   . Mitral valve prolapse   . Hearing loss   . Memory loss 02/16/2012  . Disturbance of salivary secretion [id number]  . Unspecified hereditary and idiopathic peripheral neuropathy 10/28/202012  . Closed fracture of unspecified trochanteric section of femur 05/29/2011  . Anxiety state, unspecified 08/19/2010  . Diffuse cystic mastopathy 12/23/2009  . Flatulence, eructation, and gas pain 12/03/2009  . Closed fracture of lumbar  vertebra without mention of spinal cord injury 1Mar 27, 202010  . Personal history of fall 01/29/2009  . Occlusion and stenosis of carotid artery without mention of cerebral infarction 12/27/2007  . Syncope and collapse 12/27/2007  . Other malaise and fatigue 12/06/2007  . Unspecified urinary incontinence 02/22/2005  . Cardiac pacemaker in situ 09/12/2004  . Unspecified hypothyroidism 03/18/2010  . Abnormality of gait 09/08/2003  . Edema 08/18/2003  . Macular degeneration (senile) of retina, unspecified 12/12/2001  . Undiagnosed cardiac murmurs 04/11/2001  . Senile osteoporosis 12/13/2000  . Legal blindness, as defined in [place] 03/20/1999    secondary to macular degeneration  . Fracture of greater trochanter of left femur 03/08/14   Past Surgical History  Procedure Laterality Date  . Pacemaker insertion  2006    [employer] In-Pulse  . Left hip surgery  09/2005  . Right hip surgery  08/2003  . 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 Tonsillectomy    . Breast biopsy Left 1970    benign   Social History:   reports that she has never smoked. She has never used smokeless tobacco. She reports that she does not drink alcohol or use illicit drugs.  Family History  Problem Relation Age of Onset  . Colon cancer Mother   . Cancer Mother     colon  . Heart attack Father   . Heart disease Father     MI  . Heart disease Sister     MI    Medications: Patient's Medications  New Prescriptions   No medications on file  Previous Medications   ACETAMINOPHEN (TYLENOL) 325 MG TABLET    Take 650 mg by mouth every 4 (four) hours as needed for mild pain or moderate pain.    AMLODIPINE (NORVASC) 10 MG TABLET    Take 10 mg by mouth Daily.   BISACODYL (DULCOLAX) 10 MG SUPPOSITORY    Place 1 suppository (10 mg total) rectally daily as needed for moderate constipation.   HYDROCODONE-ACETAMINOPHEN (NORCO/VICODIN) 5-325 MG PER TABLET    Take 1-2 tablets  by mouth every 6 (six) hours as needed for moderate pain.   LEVOTHYROXINE (SYNTHROID, LEVOTHROID) 25 MCG TABLET    Take 12.5 mg by mouth Daily.   MAGNESIUM HYDROXIDE (MILK OF MAGNESIA) 400 MG/5ML SUSPENSION    Take 30 mLs by mouth every other day. Every other day as needed for constipation   METFORMIN (GLUCOPHAGE) 500 MG TABLET    2 (two) times daily.   METOPROLOL SUCCINATE (TOPROL-XL) 50 MG 24 HR TABLET    Take 50 mg by mouth Daily.   MIRTAZAPINE (REMERON) 7.5 MG TABLET    Take 7.5 mg by mouth at bedtime.   POLYETHYLENE GLYCOL POWDER (GLYCOLAX/MIRALAX) POWDER    Take 17 g by mouth daily.   SODIUM PHOSPHATE (FLEET) 7-19 GM/118ML  ENEM    Place 133 mLs (1 enema total) rectally daily as needed for severe constipation.  Modified Medications   No medications on file  Discontinued Medications   No medications on file     Physical Exam: Physical Exam  Constitutional: She is oriented to person, place, and time. She appears well-developed and well-nourished.  HENT:  Head: Normocephalic and atraumatic.  Right Ear: External ear normal.  Left Ear: External ear normal.  Nose: Nose normal.  Mouth/Throat: Oropharynx is clear and moist. No oropharyngeal exudate.  Eyes: Conjunctivae and EOM are normal. Pupils are equal, round, and reactive to light. Right eye exhibits no discharge. Left eye exhibits no discharge. No scleral icterus.  Neck: Normal range of motion. Neck supple. No JVD present. No thyromegaly present.  Cardiovascular: Normal rate.   Murmur heard. Pacemaker. Systolic ejection murmur 3/6  Pulmonary/Chest: Effort normal and breath sounds normal. No respiratory distress. She has no wheezes. She has no rales. She exhibits no tenderness.  Abdominal: Soft. Bowel sounds are normal. She exhibits no distension. There is no tenderness. There is no rebound.  Musculoskeletal: Normal range of motion. She exhibits no edema or tenderness.  Lymphadenopathy:    She has no cervical adenopathy.    Neurological: She is alert and oriented to person, place, and time. She has normal reflexes. No cranial nerve deficit. She exhibits normal muscle tone. Coordination normal.  Skin: Skin is warm and dry. No rash noted. She is not diaphoretic. No erythema. No pallor.  Psychiatric: Thought content normal. Her mood appears not anxious. Her affect is not angry, not blunt, not labile and not inappropriate. Her speech is not rapid and/or pressured, not delayed and not slurred. She is slowed. She is not agitated, not aggressive, not hyperactive, not withdrawn, not actively hallucinating and not combative. Thought content is not paranoid and not delusional. Cognition and memory are impaired. She expresses impulsivity and inappropriate judgment. She exhibits a depressed mood. She exhibits abnormal recent memory.  Flat affect. Confusion-going to bed before dinner frequently.  She is attentive.    Filed Vitals:   10/13/14 1353  BP: 118/64  Pulse: 78  Temp: 97.6 F (36.4 C)  TempSrc: Tympanic  Resp: 20      Labs reviewed: Basic Metabolic Panel:  Recent Labs  16/10/96 03/08/14 0849 03/09/14 0425 03/24/14  NA  --  138 141 140  K  --  3.9 3.9 4.2  CL  --  100 103  --   CO2  --  27 24  --   GLUCOSE  --  152* 110*  --   BUN  --  17 19 22*  CREATININE  --  0.84 0.75 0.9  CALCIUM  --  9.8 9.1  --   TSH 2.47  --   --  2.58   Liver Function Tests:  Recent Labs  03/24/14  AST 18  ALT 9  ALKPHOS 198*   No results for input(s): LIPASE, AMYLASE in the last 8760 hours. No results for input(s): AMMONIA in the last 8760 hours. CBC:  Recent Labs  03/08/14 0849 03/09/14 0425 03/24/14  WBC 11.8* 10.7* 8.5  HGB 13.6 12.9 14.0  HCT 39.6 37.6 40  MCV 86.1 87.2  --   PLT 206 207 386   Lipid Panel: No results for input(s): CHOL, HDL, LDLCALC, TRIG, CHOLHDL, LDLDIRECT in the last 8760 hours.  Past Procedures:  03/06/14 X-ray pelvis([facility]) no acute osseous abnormality. Chronic degenerative and  postsurgical findings(s/p R hip arthroplasty and  left femoral neck compression screw with associated proximal femoral sideplate and fixation screws-no hardware loosening or migration visible at imaged components. R inferior ischial and inferior right pubic ramus osseous deformities consistent with previously identified remote fracture injuries)   03/08/14 X-ray lumbar spine:  IMPRESSION: 1. Chronic wedge compression fractures. 2. No evidence for acute abnormality.  03/08/14 CXR  IMPRESSION: Low lung volumes. No active disease.  03/08/14 X-ray pelvis  IMPRESSION: 1. Postoperative changes. 2. No evidence for acute abnormality.  03/08/14 CT head w/o contrast:  IMPRESSION: 1. Atrophy and small vessel disease. 2. No evidence for acute intracranial abnormality. 3. Right posterior parietal scalp hematoma without underlying calvarial fracture. 4. Sinusitis involving the right sphenoid air cell. No evidence for sinus wall fracture  03/08/14 CT pelvis and abd w/o contrast:  IMPRESSION: 1. Nondisplaced fracture along the left greater trochanter is new since prior exam. It is unclear if this is acute or chronic injury. 2. Postoperative changes of both hips. 3. Remote L5 compression fracture. 4. Extensive atherosclerotic changes including coronary artery disease. 5. Small hiatal hernia. 6. No acute intra-abdominal lesion to explain the patient's symptoms.  Assessment/Plan ATRIAL FIBRILLATION Rate controlled.       Constipation Stable, continue MiraLax daily. Dulcolax, Fleet, and MOM prn available to her.        Dementia 04/03/14 MMSE 22/30. May consider memory preserving meds if no better after adjusting to SNF and better managing mood.       Depression 04/05/14 her appetite was reportedly poor-weights #116Ibs 04/02/14 and # 117Ibds 04/09/14. Stabilized since  Mirtazapine 7.5 mg since the patient c/o feeling depressed per SW 04/08/14( feeling depressed, loss of interests,  restlessness, and feeling like she just wants out of here. Noted flat affect. Said she felt she soul be better off dead but would not hurt herself) 06/30/14 stable, adjusting to [facility] well.  10/13/14 flat affect, reportedly poor appetite and continuously gradually losing weight, will increase Mirtazapine to 15mg  for better mood/weight management.     Essential hypertension Controlled, takes Metoprolol 50mg  daily and Amlodipine 10mg  daily.     Hypertonicity of bladder Better with Oxybutynin 5mg  daily.      Hypothyroidism Takes Levothyroxine 12.755mcg, 01/16/14 TSH 2.474. 03/24/14 TSH 2.576    Type 2 diabetes mellitus with blindness, with macular edema, with severe nonproliferative retinopathy takes Metformin 500mg  bid, 01/16/14 Hgb A1c 6.5. 03/24/14 Hgb A1c 6.4       Family/ Staff Communication: observe the patient  Goals of Care: SNF  Labs/tests ordered: none

## 2014-10-13 NOTE — Assessment & Plan Note (Signed)
Controlled, takes Metoprolol 50mg daily and Amlodipine 10mg daily.    

## 2014-10-13 NOTE — Assessment & Plan Note (Signed)
Takes Levothyroxine 12.5mcg, 01/16/14 TSH 2.474. 03/24/14 TSH 2.576  

## 2014-10-13 NOTE — Assessment & Plan Note (Signed)
Rate controlled 

## 2014-10-13 NOTE — Assessment & Plan Note (Signed)
Stable, continue MiraLax daily. Dulcolax, Fleet, and MOM prn available to her.     

## 2014-10-28 ENCOUNTER — Encounter: Payer: Medicare PPO | Admitting: Internal Medicine

## 2014-11-03 ENCOUNTER — Encounter: Payer: Self-pay | Admitting: Internal Medicine

## 2014-11-03 DIAGNOSIS — Z950 Presence of cardiac pacemaker: Secondary | ICD-10-CM | POA: Diagnosis not present

## 2014-11-03 NOTE — Progress Notes (Signed)
The patient's son and daughter in law came in to discuss what could be expected from the patient's pacemaker as it approaches end-of-life in this 81 year old woman with significant dementia. It is not clear if she is totally dependent i.e. does she have a heart rate below 35.  We discussed the pacemaker batteries typically behaves like gas engines. That is to say they work until they stop working. If she were to have an underlying heartbeat below our device detection, she might survive some days after that almost certainly with significant interval cognitive decline. I would expected to last days but not more dramatic. In the event that there is no underlying heart rhythm she would simply pass at that time.  We discussed for some time how the best care for [name]. I.e. would dislocate her well look like. It was her desire to not have her life prolonged. I think, in this context, it is very reasonable to not pursue pacemaker generator replacement. Not withstanding the fact that is a relatively benign procedure, risks can occur.  We met for 45 minutes.

## 2014-11-12 ENCOUNTER — Non-Acute Institutional Stay (SKILLED_NURSING_FACILITY): Payer: 59 | Admitting: Nurse Practitioner

## 2014-11-12 ENCOUNTER — Encounter: Payer: Self-pay | Admitting: Nurse Practitioner

## 2014-11-12 DIAGNOSIS — I10 Essential (primary) hypertension: Secondary | ICD-10-CM

## 2014-11-12 DIAGNOSIS — E113419 Type 2 diabetes mellitus with severe nonproliferative diabetic retinopathy with macular edema, unspecified eye: Secondary | ICD-10-CM

## 2014-11-12 DIAGNOSIS — E039 Hypothyroidism, unspecified: Secondary | ICD-10-CM

## 2014-11-12 DIAGNOSIS — F329 Major depressive disorder, single episode, unspecified: Secondary | ICD-10-CM

## 2014-11-12 DIAGNOSIS — E11341 Type 2 diabetes mellitus with severe nonproliferative diabetic retinopathy with macular edema: Secondary | ICD-10-CM

## 2014-11-12 DIAGNOSIS — H547 Unspecified visual loss: Principal | ICD-10-CM

## 2014-11-12 DIAGNOSIS — F32A Depression, unspecified: Secondary | ICD-10-CM

## 2014-11-12 DIAGNOSIS — F0390 Unspecified dementia without behavioral disturbance: Secondary | ICD-10-CM | POA: Diagnosis not present

## 2014-11-12 DIAGNOSIS — I482 Chronic atrial fibrillation, unspecified: Secondary | ICD-10-CM

## 2014-11-12 DIAGNOSIS — K5900 Constipation, unspecified: Secondary | ICD-10-CM

## 2014-11-12 DIAGNOSIS — N318 Other neuromuscular dysfunction of bladder: Secondary | ICD-10-CM | POA: Diagnosis not present

## 2014-11-12 DIAGNOSIS — H540 Blindness, both eyes: Secondary | ICD-10-CM

## 2014-11-12 NOTE — Progress Notes (Signed)
Patient ID: [name]. Dominy, female   DOB: Jun 27, 1915, 81 y.o.   MRN: [id number]   Code Status: DNR  Allergies  Allergen Reactions  . Codeine     unknown  . Ibuprofen     unknown  . Lisinopril     unknown  . Penicillins     unknown  . Philis Nettle [Trospium Chloride]     unknown    Chief Complaint  Patient presents with  . Medical Management of Chronic Issues    HPI: Patient is a 81 y.o. female seen in the SNF at [facility] today for evaluation of chronic medical conditions.  Problem List Items Addressed This Visit    ATRIAL FIBRILLATION    Rate controlled.        Constipation    Stable, continue MiraLax daily. Dulcolax, Fleet, and MOM prn available to her.         Dementia    04/03/14 MMSE 22/30. May consider memory preserving meds if no better after adjusting to SNF and better managing mood.        Depression    04/05/14 her appetite was reportedly poor-weights #116Ibs 04/02/14 and # 117Ibds 04/09/14. Stabilized since  Mirtazapine 7.5 mg since the patient c/o feeling depressed per SW 04/08/14( feeling depressed, loss of interests, restlessness, and feeling like she just wants out of here. Noted flat affect. Said she felt she soul be better off dead but would not hurt herself) 06/30/14 stable, adjusting to [facility] well.  10/13/14 flat affect, reportedly poor appetite and continuously gradually losing weight, will increase Mirtazapine to 15mg  for better mood/weight management.  11/12/14 seems stabilized, continue Mirtazapine 15mg  daily.       Essential hypertension    Controlled, takes Metoprolol 50mg  daily and Amlodipine 10mg  daily.         Hypertonicity of bladder    Better with Oxybutynin 5mg  daily.       Hypothyroidism    Takes Levothyroxine 12.31mcg, 01/16/14 TSH 2.474. 03/24/14 TSH 2.576       Type 2 diabetes mellitus with blindness, with macular edema, with severe nonproliferative retinopathy - Primary    takes Metformin 500mg  bid, 01/16/14 Hgb A1c 6.5. 03/24/14  Hgb A1c 6.4          Review of Systems:  Review of Systems  Constitutional: Positive for unexpected weight change. Negative for fever, chills and diaphoresis.  HENT: Positive for hearing loss. Negative for congestion, ear discharge, ear pain, nosebleeds, sore throat and tinnitus.   Eyes: Negative for photophobia, pain, discharge and redness.       Low vision  Respiratory: Negative for cough, shortness of breath, wheezing and stridor.   Cardiovascular: Negative for chest pain, palpitations and leg swelling.  Gastrointestinal: Negative for nausea, vomiting, abdominal pain, diarrhea, constipation and blood in stool.  Endocrine: Negative for polydipsia.  Genitourinary: Positive for frequency. Negative for dysuria, urgency, hematuria and flank pain.  Musculoskeletal: Negative for myalgias, back pain and neck pain.  Skin: Negative for rash.  Allergic/Immunologic: Negative for environmental allergies.  Neurological: Negative for dizziness, tremors, seizures, weakness and headaches.  Hematological: Does not bruise/bleed easily.  Psychiatric/Behavioral: Negative for suicidal ideas and hallucinations. The patient is nervous/anxious.      Past Medical History  Diagnosis Date  . HTN (hypertension)   . Other and unspecified hyperlipidemia   . DM (diabetes mellitus)   . Hyperthyroidism   . Hemorrhoids   . AF (atrial fibrillation)   . Rectal bleeding   . Sinus node  dysfunction   . Hip fracture   . Bradycardia   . Aortic stenosis   . Mitral valve prolapse   . Hearing loss   . Memory loss 02/16/2012  . Disturbance of salivary secretion [id number]  . Unspecified hereditary and idiopathic peripheral neuropathy 10/28/202012  . Closed fracture of unspecified trochanteric section of femur 05/29/2011  . Anxiety state, unspecified 08/19/2010  . Diffuse cystic mastopathy 12/23/2009  . Flatulence, eructation, and gas pain 12/03/2009  . Closed fracture of lumbar vertebra without mention of spinal cord  injury 1January 22, 202010  . Personal history of fall 01/29/2009  . Occlusion and stenosis of carotid artery without mention of cerebral infarction 12/27/2007  . Syncope and collapse 12/27/2007  . Other malaise and fatigue 12/06/2007  . Unspecified urinary incontinence 02/22/2005  . Cardiac pacemaker in situ 09/12/2004  . Unspecified hypothyroidism 03/18/2010  . Abnormality of gait 09/08/2003  . Edema 08/18/2003  . Macular degeneration (senile) of retina, unspecified 12/12/2001  . Undiagnosed cardiac murmurs 04/11/2001  . Senile osteoporosis 12/13/2000  . [place] 03/20/1999    secondary to macular degeneration  . Fracture of greater trochanter of left femur 03/08/14   Past Surgical History  Procedure Laterality Date  . Pacemaker insertion  2006    [employer] In-Pulse  . Left hip surgery  09/2005  . Right hip surgery  08/2003  . 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 Tonsillectomy    . Breast biopsy Left 1970    benign   Social History:   reports that she has never smoked. She has never used smokeless tobacco. She reports that she does not drink alcohol or use illicit drugs.  Family History  Problem Relation Age of Onset  . Colon cancer Mother   . Cancer Mother     colon  . Heart attack Father   . Heart disease Father     MI  . Heart disease Sister     MI    Medications: Patient's Medications  New Prescriptions   No medications on file  Previous Medications   ACETAMINOPHEN (TYLENOL) 325 MG TABLET    Take 650 mg by mouth every 4 (four) hours as needed for mild pain or moderate pain.    AMLODIPINE (NORVASC) 10 MG TABLET    Take 10 mg by mouth Daily.   BISACODYL (DULCOLAX) 10 MG SUPPOSITORY    Place 1 suppository (10 mg total) rectally daily as needed for moderate constipation.   HYDROCODONE-ACETAMINOPHEN (NORCO/VICODIN) 5-325 MG PER TABLET    Take 1-2 tablets by mouth every 6 (six) hours as needed  for moderate pain.   LEVOTHYROXINE (SYNTHROID, LEVOTHROID) 25 MCG TABLET    Take 12.5 mg by mouth Daily.   MAGNESIUM HYDROXIDE (MILK OF MAGNESIA) 400 MG/5ML SUSPENSION    Take 30 mLs by mouth every other day. Every other day as needed for constipation   METFORMIN (GLUCOPHAGE) 500 MG TABLET    2 (two) times daily.   METOPROLOL SUCCINATE (TOPROL-XL) 50 MG 24 HR TABLET    Take 50 mg by mouth Daily.   MIRTAZAPINE (REMERON) 7.5 MG TABLET    Take 7.5 mg by mouth at bedtime.   POLYETHYLENE GLYCOL POWDER (GLYCOLAX/MIRALAX) POWDER    Take 17 g by mouth daily.   SODIUM PHOSPHATE (FLEET) 7-19 GM/118ML ENEM    Place 133 mLs (1 enema total) rectally daily as needed for severe constipation.  Modified Medications  No medications on file  Discontinued Medications   No medications on file     Physical Exam: Physical Exam  Constitutional: She is oriented to person, place, and time. She appears well-developed and well-nourished.  HENT:  Head: Normocephalic and atraumatic.  Right Ear: External ear normal.  Left Ear: External ear normal.  Nose: Nose normal.  Mouth/Throat: Oropharynx is clear and moist. No oropharyngeal exudate.  Eyes: Conjunctivae and EOM are normal. Pupils are equal, round, and reactive to light. Right eye exhibits no discharge. Left eye exhibits no discharge. No scleral icterus.  Neck: Normal range of motion. Neck supple. No JVD present. No thyromegaly present.  Cardiovascular: Normal rate.   Murmur heard. Pacemaker. Systolic ejection murmur 3/6  Pulmonary/Chest: Effort normal and breath sounds normal. No respiratory distress. She has no wheezes. She has no rales. She exhibits no tenderness.  Abdominal: Soft. Bowel sounds are normal. She exhibits no distension. There is no tenderness. There is no rebound.  Musculoskeletal: Normal range of motion. She exhibits no edema or tenderness.  Lymphadenopathy:    She has no cervical adenopathy.  Neurological: She is alert and oriented to  person, place, and time. She has normal reflexes. No cranial nerve deficit. She exhibits normal muscle tone. Coordination normal.  Skin: Skin is warm and dry. No rash noted. She is not diaphoretic. No erythema. No pallor.  Psychiatric: Thought content normal. Her mood appears not anxious. Her affect is not angry, not blunt, not labile and not inappropriate. Her speech is not rapid and/or pressured, not delayed and not slurred. She is slowed. She is not agitated, not aggressive, not hyperactive, not withdrawn, not actively hallucinating and not combative. Thought content is not paranoid and not delusional. Cognition and memory are impaired. She expresses impulsivity and inappropriate judgment. She exhibits a depressed mood. She exhibits abnormal recent memory.  Flat affect. Confusion-going to bed before dinner frequently.  She is attentive.    Filed Vitals:   11/12/14 1445  BP: 120/60  Pulse: 80  Temp: 97.9 F (36.6 C)  TempSrc: Tympanic  Resp: 18      Labs reviewed: Basic Metabolic Panel:  Recent Labs  16/10/96 03/08/14 0849 03/09/14 0425 03/24/14  NA  --  138 141 140  K  --  3.9 3.9 4.2  CL  --  100 103  --   CO2  --  27 24  --   GLUCOSE  --  152* 110*  --   BUN  --  17 19 22*  CREATININE  --  0.84 0.75 0.9  CALCIUM  --  9.8 9.1  --   TSH 2.47  --   --  2.58   Liver Function Tests:  Recent Labs  03/24/14  AST 18  ALT 9  ALKPHOS 198*   No results for input(s): LIPASE, AMYLASE in the last 8760 hours. No results for input(s): AMMONIA in the last 8760 hours. CBC:  Recent Labs  03/08/14 0849 03/09/14 0425 03/24/14  WBC 11.8* 10.7* 8.5  HGB 13.6 12.9 14.0  HCT 39.6 37.6 40  MCV 86.1 87.2  --   PLT 206 207 386   Lipid Panel: No results for input(s): CHOL, HDL, LDLCALC, TRIG, CHOLHDL, LDLDIRECT in the last 8760 hours.  Past Procedures:  03/06/14 X-ray pelvis([facility]) no acute osseous abnormality. Chronic degenerative and postsurgical findings(s/p R hip arthroplasty  and left femoral neck compression screw with associated proximal femoral sideplate and fixation screws-no hardware loosening or migration visible at imaged components. R inferior  ischial and inferior right pubic ramus osseous deformities consistent with previously identified remote fracture injuries)   03/08/14 X-ray lumbar spine:  IMPRESSION: 1. Chronic wedge compression fractures. 2. No evidence for acute abnormality.  03/08/14 CXR  IMPRESSION: Low lung volumes. No active disease.  03/08/14 X-ray pelvis  IMPRESSION: 1. Postoperative changes. 2. No evidence for acute abnormality.  03/08/14 CT head w/o contrast:  IMPRESSION: 1. Atrophy and small vessel disease. 2. No evidence for acute intracranial abnormality. 3. Right posterior parietal scalp hematoma without underlying calvarial fracture. 4. Sinusitis involving the right sphenoid air cell. No evidence for sinus wall fracture  03/08/14 CT pelvis and abd w/o contrast:  IMPRESSION: 1. Nondisplaced fracture along the left greater trochanter is new since prior exam. It is unclear if this is acute or chronic injury. 2. Postoperative changes of both hips. 3. Remote L5 compression fracture. 4. Extensive atherosclerotic changes including coronary artery disease. 5. Small hiatal hernia. 6. No acute intra-abdominal lesion to explain the patient's symptoms.  Assessment/Plan Type 2 diabetes mellitus with blindness, with macular edema, with severe nonproliferative retinopathy takes Metformin  bid, 01/16/14 Hgb A1c 6.5. 03/24/14 Hgb A1c 6.4    Essential hypertension Controlled, takes Metoprolol  daily and Amlodipine  daily.      Hypothyroidism Takes Levothyroxine 12.94mcg, 01/16/14 TSH 2.474. 03/24/14 TSH 2.576    Constipation Stable, continue MiraLax daily. Dulcolax, Fleet, and MOM prn available to her.      Depression 04/05/14 her appetite was reportedly poor-weights #116Ibs 04/02/14 and # 117Ibds 04/09/14.  Stabilized since  Mirtazapine 7.5 mg since the patient c/o feeling depressed per SW 04/08/14( feeling depressed, loss of interests, restlessness, and feeling like she just wants out of here. Noted flat affect. Said she felt she soul be better off dead but would not hurt herself) 06/30/14 stable, adjusting to [facility] well.  10/13/14 flat affect, reportedly poor appetite and continuously gradually losing weight, will increase Mirtazapine to  for better mood/weight management.  11/12/14 seems stabilized, continue Mirtazapine  daily.    Hypertonicity of bladder Better with Oxybutynin  daily.    Dementia 04/03/14 MMSE 22/30. May consider memory preserving meds if no better after adjusting to SNF and better managing mood.     ATRIAL FIBRILLATION Rate controlled.       Family/ Staff Communication: observe the patient  Goals of Care: SNF  Labs/tests ordered: none

## 2014-11-12 NOTE — Assessment & Plan Note (Signed)
Takes Levothyroxine 12.5mcg, 01/16/14 TSH 2.474. 03/24/14 TSH 2.576  

## 2014-11-12 NOTE — Assessment & Plan Note (Signed)
04/03/14 MMSE 22/30. May consider memory preserving meds if no better after adjusting to SNF and better managing mood.   

## 2014-11-12 NOTE — Assessment & Plan Note (Signed)
Better with Oxybutynin 5mg daily.    

## 2014-11-12 NOTE — Assessment & Plan Note (Signed)
Stable, continue MiraLax daily. Dulcolax, Fleet, and MOM prn available to her.     

## 2014-11-12 NOTE — Assessment & Plan Note (Signed)
Controlled, takes Metoprolol 50mg daily and Amlodipine 10mg daily.    

## 2014-11-12 NOTE — Assessment & Plan Note (Signed)
takes Metformin 500mg  bid, 01/16/14 Hgb A1c 6.5. 03/24/14 Hgb A1c 6.4

## 2014-11-12 NOTE — Assessment & Plan Note (Signed)
Rate controlled 

## 2014-11-12 NOTE — Assessment & Plan Note (Signed)
04/05/14 her appetite was reportedly poor-weights #116Ibs 04/02/14 and # 117Ibds 04/09/14. Stabilized since  Mirtazapine 7.5 mg since the patient c/o feeling depressed per SW 04/08/14( feeling depressed, loss of interests, restlessness, and feeling like she just wants out of here. Noted flat affect. Said she felt she soul be better off dead but would not hurt herself) 06/30/14 stable, adjusting to [facility] well.  10/13/14 flat affect, reportedly poor appetite and continuously gradually losing weight, will increase Mirtazapine to 15mg  for better mood/weight management.  11/12/14 seems stabilized, continue Mirtazapine 15mg  daily.

## 2014-12-15 ENCOUNTER — Non-Acute Institutional Stay (SKILLED_NURSING_FACILITY): Payer: 59 | Admitting: Nurse Practitioner

## 2014-12-15 ENCOUNTER — Encounter: Payer: Self-pay | Admitting: Nurse Practitioner

## 2014-12-15 DIAGNOSIS — I480 Paroxysmal atrial fibrillation: Secondary | ICD-10-CM | POA: Diagnosis not present

## 2014-12-15 DIAGNOSIS — K5900 Constipation, unspecified: Secondary | ICD-10-CM | POA: Diagnosis not present

## 2014-12-15 DIAGNOSIS — N318 Other neuromuscular dysfunction of bladder: Secondary | ICD-10-CM

## 2014-12-15 DIAGNOSIS — F329 Major depressive disorder, single episode, unspecified: Secondary | ICD-10-CM | POA: Diagnosis not present

## 2014-12-15 DIAGNOSIS — E11341 Type 2 diabetes mellitus with severe nonproliferative diabetic retinopathy with macular edema: Secondary | ICD-10-CM | POA: Diagnosis not present

## 2014-12-15 DIAGNOSIS — E034 Atrophy of thyroid (acquired): Secondary | ICD-10-CM | POA: Diagnosis not present

## 2014-12-15 DIAGNOSIS — I10 Essential (primary) hypertension: Secondary | ICD-10-CM

## 2014-12-15 DIAGNOSIS — R627 Adult failure to thrive: Secondary | ICD-10-CM

## 2014-12-15 DIAGNOSIS — H540 Blindness, both eyes: Secondary | ICD-10-CM

## 2014-12-15 DIAGNOSIS — H547 Unspecified visual loss: Secondary | ICD-10-CM

## 2014-12-15 DIAGNOSIS — E113419 Type 2 diabetes mellitus with severe nonproliferative diabetic retinopathy with macular edema, unspecified eye: Secondary | ICD-10-CM

## 2014-12-15 DIAGNOSIS — F32A Depression, unspecified: Secondary | ICD-10-CM

## 2014-12-15 DIAGNOSIS — E038 Other specified hypothyroidism: Secondary | ICD-10-CM | POA: Diagnosis not present

## 2014-12-15 DIAGNOSIS — F0390 Unspecified dementia without behavioral disturbance: Secondary | ICD-10-CM

## 2014-12-15 HISTORY — DX: Adult failure to thrive: R62.7

## 2014-12-15 NOTE — Assessment & Plan Note (Signed)
Diet controlled. Hospice Service. Comfort measures only.

## 2014-12-15 NOTE — Assessment & Plan Note (Signed)
Takes Levothyroxine 12.5mcg, 01/16/14 TSH 2.474. 03/24/14 TSH 2.576  

## 2014-12-15 NOTE — Assessment & Plan Note (Signed)
Hear rate is in control.  

## 2014-12-15 NOTE — Assessment & Plan Note (Signed)
04/03/14 MMSE 22/30. Comfort measures only and Hospice Service for care needs.

## 2014-12-15 NOTE — Assessment & Plan Note (Signed)
04/05/14 her appetite was reportedly poor-weights #116Ibs 04/02/14 and # 117Ibds 04/09/14. Stabilized since  Mirtazapine 7.5 mg since the patient c/o feeling depressed per SW 04/08/14( feeling depressed, loss of interests, restlessness, and feeling like she just wants out of here. Noted flat affect. Said she felt she soul be better off dead but would not hurt herself) 06/30/14 stable, adjusting to [facility] well.  10/13/14 flat affect, reportedly poor appetite and continuously gradually losing weight, will increase Mirtazapine to 15mg  for better mood/weight management.  11/12/14 seems stabilized, continue Mirtazapine 15mg  daily.  12/15/14 decrease Mirtazapine to 7.5mg  daily-the patient continue to decline gradually. Hospice service and comfort measures only.

## 2014-12-15 NOTE — Assessment & Plan Note (Signed)
Comfort measures and Hospice Service.

## 2014-12-15 NOTE — Addendum Note (Signed)
Addended by: Yasira Engelson X on: 12/15/2014 04:30 PM   Modules accepted: Level of Service

## 2014-12-15 NOTE — Assessment & Plan Note (Signed)
Incontinent of bladder, adult dependent for care needs. Off med

## 2014-12-15 NOTE — Assessment & Plan Note (Signed)
Controlled, takes Metoprolol 50mg  daily and Amlodipine 10mg  daily.

## 2014-12-15 NOTE — Assessment & Plan Note (Signed)
Stable, continue MiraLax daily. Dulcolax, Fleet, and MOM prn available to her.     

## 2014-12-15 NOTE — Progress Notes (Signed)
Patient ID: [name], female   DOB: Sep 26, 1914, 81 y.o.   MRN: [id number]   Code Status: DNR  Allergies  Allergen Reactions  . Codeine     unknown  . Ibuprofen     unknown  . Lisinopril     unknown  . Penicillins     unknown  . Philis NettleSanctura [Trospium Chloride]     unknown    Chief Complaint  Patient presents with  . Medical Management of Chronic Issues  . Acute Visit    deconditioniung.     HPI: Patient is a 81 y.o. female [facility] today for evaluation of deconditioning and chronic medical conditions.  Problem List Items Addressed This Visit    Hypothyroidism - Primary    Takes Levothyroxine 12.105mcg, 01/16/14 TSH 2.474. 03/24/14 TSH 2.576       Type 2 diabetes mellitus with blindness, with macular edema, with severe nonproliferative retinopathy    Diet controlled. Hospice Service. Comfort measures only.       Essential hypertension    Controlled, takes Metoprolol 50mg  daily and Amlodipine 10mg  daily.         ATRIAL FIBRILLATION    Hear rate is in control        Hypertonicity of bladder    Incontinent of bladder, adult dependent for care needs. Off med      Dementia    04/03/14 MMSE 22/30. Comfort measures only and Hospice Service for care needs.         Constipation    Stable, continue MiraLax daily. Dulcolax, Fleet, and MOM prn available to her.         Depression    04/05/14 her appetite was reportedly poor-weights #116Ibs 04/02/14 and # 117Ibds 04/09/14. Stabilized since  Mirtazapine 7.5 mg since the patient c/o feeling depressed per SW 04/08/14( feeling depressed, loss of interests, restlessness, and feeling like she just wants out of here. Noted flat affect. Said she felt she soul be better off dead but would not hurt herself) 06/30/14 stable, adjusting to MCU well.  10/13/14 flat affect, reportedly poor appetite and continuously gradually losing weight, will increase Mirtazapine to 15mg  for better mood/weight management.    11/12/14 seems stabilized, continue Mirtazapine 15mg  daily.  12/15/14 decrease Mirtazapine to 7.5mg  daily-the patient continue to decline gradually. Hospice service and comfort measures only.        FTT (failure to thrive) in adult    Comfort measures and Hospice Service.          Review of Systems:  Review of Systems  Constitutional: Positive for unexpected weight change. Negative for fever, chills and diaphoresis.  HENT: Positive for hearing loss. Negative for congestion, ear discharge, ear pain, nosebleeds, sore throat and tinnitus.   Eyes: Negative for photophobia, pain, discharge and redness.       Low vision  Respiratory: Negative for cough, shortness of breath, wheezing and stridor.   Cardiovascular: Negative for chest pain, palpitations and leg swelling.  Gastrointestinal: Negative for nausea, vomiting, abdominal pain, diarrhea, constipation and blood in stool.  Endocrine: Negative for polydipsia.  Genitourinary: Positive for frequency. Negative for dysuria, urgency, hematuria and flank pain.  Musculoskeletal: Negative for myalgias, back pain and neck pain.  Skin: Negative for rash.  Allergic/Immunologic: Negative for environmental allergies.  Neurological: Negative for dizziness, tremors, seizures, weakness and headaches.  Hematological: Does not bruise/bleed easily.  Psychiatric/Behavioral: Negative for suicidal ideas and hallucinations. The patient is nervous/anxious.  Past Medical History  Diagnosis Date  . HTN (hypertension)   . Other and unspecified hyperlipidemia   . DM (diabetes mellitus)   . Hyperthyroidism   . Hemorrhoids   . AF (atrial fibrillation)   . Rectal bleeding   . Sinus node dysfunction   . Hip fracture   . Bradycardia   . Aortic stenosis   . Mitral valve prolapse   . Hearing loss   . Memory loss 02/16/2012  . Disturbance of salivary secretion [id number]  . Unspecified hereditary and idiopathic peripheral neuropathy 10/28/202012  . Closed  fracture of unspecified trochanteric section of femur 05/29/2011  . Anxiety state, unspecified 08/19/2010  . Diffuse cystic mastopathy 12/23/2009  . Flatulence, eructation, and gas pain 12/03/2009  . Closed fracture of lumbar vertebra without mention of spinal cord injury 12020/05/1009  . Personal history of fall 01/29/2009  . Occlusion and stenosis of carotid artery without mention of cerebral infarction 12/27/2007  . Syncope and collapse 12/27/2007  . Other malaise and fatigue 12/06/2007  . Unspecified urinary incontinence 02/22/2005  . Cardiac pacemaker in situ 09/12/2004  . Unspecified hypothyroidism 03/18/2010  . Abnormality of gait 09/08/2003  . Edema 08/18/2003  . Macular degeneration (senile) of retina, unspecified 12/12/2001  . Undiagnosed cardiac murmurs 04/11/2001  . Senile osteoporosis 12/13/2000  . Legal blindness, as defined in [place] 03/20/1999    secondary to macular degeneration  . Fracture of greater trochanter of left femur 03/08/14   Past Surgical History  Procedure Laterality Date  . Pacemaker insertion  2006    [employer] In-Pulse  . Left hip surgery  09/2005  . Right hip surgery  08/2003  . 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 Tonsillectomy    . Breast biopsy Left 1970    benign   Social History:   reports that she has never smoked. She has never used smokeless tobacco. She reports that she does not drink alcohol or use illicit drugs.  Family History  Problem Relation Age of Onset  . Colon cancer Mother   . Cancer Mother     colon  . Heart attack Father   . Heart disease Father     MI  . Heart disease Sister     MI    Medications: Patient's Medications  New Prescriptions   No medications on file  Previous Medications   ACETAMINOPHEN (TYLENOL) 325 MG TABLET    Take 650 mg by mouth every 4 (four) hours as needed for mild pain or moderate pain.    AMLODIPINE (NORVASC) 10 MG TABLET    Take 10 mg by  mouth Daily.   BISACODYL (DULCOLAX) 10 MG SUPPOSITORY    Place 1 suppository (10 mg total) rectally daily as needed for moderate constipation.   HYDROCODONE-ACETAMINOPHEN (NORCO/VICODIN) 5-325 MG PER TABLET    Take 1-2 tablets by mouth every 6 (six) hours as needed for moderate pain.   LEVOTHYROXINE (SYNTHROID, LEVOTHROID) 25 MCG TABLET    Take 12.5 mg by mouth Daily.   MAGNESIUM HYDROXIDE (MILK OF MAGNESIA) 400 MG/5ML SUSPENSION    Take 30 mLs by mouth every other day. Every other day as needed for constipation   METOPROLOL SUCCINATE (TOPROL-XL) 50 MG 24 HR TABLET    Take 50 mg by mouth Daily.   MIRTAZAPINE (REMERON) 7.5 MG TABLET    Take 7.5 mg by mouth at bedtime.   POLYETHYLENE GLYCOL POWDER (GLYCOLAX/MIRALAX) POWDER    Take  17 g by mouth daily.   SODIUM PHOSPHATE (FLEET) 7-19 GM/118ML ENEM    Place 133 mLs (1 enema total) rectally daily as needed for severe constipation.  Modified Medications   No medications on file  Discontinued Medications   METFORMIN (GLUCOPHAGE) 500 MG TABLET    2 (two) times daily.     Physical Exam: Physical Exam  Constitutional: She is oriented to person, place, and time. She appears well-developed and well-nourished.  HENT:  Head: Normocephalic and atraumatic.  Right Ear: External ear normal.  Left Ear: External ear normal.  Nose: Nose normal.  Mouth/Throat: Oropharynx is clear and moist. No oropharyngeal exudate.  Eyes: Conjunctivae and EOM are normal. Pupils are equal, round, and reactive to light. Right eye exhibits no discharge. Left eye exhibits no discharge. No scleral icterus.  Neck: Normal range of motion. Neck supple. No JVD present. No thyromegaly present.  Cardiovascular: Normal rate.   Murmur heard. Pacemaker. Systolic ejection murmur 3/6  Pulmonary/Chest: Effort normal and breath sounds normal. No respiratory distress. She has no wheezes. She has no rales. She exhibits no tenderness.  Abdominal: Soft. Bowel sounds are normal. She exhibits no  distension. There is no tenderness. There is no rebound.  Musculoskeletal: Normal range of motion. She exhibits no edema or tenderness.  Lymphadenopathy:    She has no cervical adenopathy.  Neurological: She is alert and oriented to person, place, and time. She has normal reflexes. No cranial nerve deficit. She exhibits normal muscle tone. Coordination normal.  Skin: Skin is warm and dry. No rash noted. She is not diaphoretic. No erythema. No pallor.  Psychiatric: Thought content normal. Her mood appears not anxious. Her affect is not angry, not blunt, not labile and not inappropriate. Her speech is not rapid and/or pressured, not delayed and not slurred. She is slowed. She is not agitated, not aggressive, not hyperactive, not withdrawn, not actively hallucinating and not combative. Thought content is not paranoid and not delusional. Cognition and memory are impaired. She expresses impulsivity and inappropriate judgment. She exhibits a depressed mood. She exhibits abnormal recent memory.  Flat affect. Confusion-going to bed before dinner frequently.  She is attentive.    Filed Vitals:   12/15/14 1615  BP: 110/68  Pulse: 68  Temp: 98.8 F (37.1 C)  TempSrc: Tympanic  Resp: 20      Labs reviewed: Basic Metabolic Panel:  Recent Labs  09/81/1906/18/15 03/08/14 0849 03/09/14 0425 03/24/14  NA  --  138 141 140  K  --  3.9 3.9 4.2  CL  --  100 103  --   CO2  --  27 24  --   GLUCOSE  --  152* 110*  --   BUN  --  17 19 22*  CREATININE  --  0.84 0.75 0.9  CALCIUM  --  9.8 9.1  --   TSH 2.47  --   --  2.58   Liver Function Tests:  Recent Labs  03/24/14  AST 18  ALT 9  ALKPHOS 198*   No results for input(s): LIPASE, AMYLASE in the last 8760 hours. No results for input(s): AMMONIA in the last 8760 hours. CBC:  Recent Labs  03/08/14 0849 03/09/14 0425 03/24/14  WBC 11.8* 10.7* 8.5  HGB 13.6 12.9 14.0  HCT 39.6 37.6 40  MCV 86.1 87.2  --   PLT 206 207 386   Lipid Panel: No  results for input(s): CHOL, HDL, LDLCALC, TRIG, CHOLHDL, LDLDIRECT in the last 8760 hours.  Past Procedures:  03/06/14 X-ray pelvis([facility]) no acute osseous abnormality. Chronic degenerative and postsurgical findings(s/p R hip arthroplasty and left femoral neck compression screw with associated proximal femoral sideplate and fixation screws-no hardware loosening or migration visible at imaged components. R inferior ischial and inferior right pubic ramus osseous deformities consistent with previously identified remote fracture injuries)   03/08/14 X-ray lumbar spine:  IMPRESSION: 1. Chronic wedge compression fractures. 2. No evidence for acute abnormality.  03/08/14 CXR  IMPRESSION: Low lung volumes. No active disease.  03/08/14 X-ray pelvis  IMPRESSION: 1. Postoperative changes. 2. No evidence for acute abnormality.  03/08/14 CT head w/o contrast:  IMPRESSION: 1. Atrophy and small vessel disease. 2. No evidence for acute intracranial abnormality. 3. Right posterior parietal scalp hematoma without underlying calvarial fracture. 4. Sinusitis involving the right sphenoid air cell. No evidence for sinus wall fracture  03/08/14 CT pelvis and abd w/o contrast:  IMPRESSION: 1. Nondisplaced fracture along the left greater trochanter is new since prior exam. It is unclear if this is acute or chronic injury. 2. Postoperative changes of both hips. 3. Remote L5 compression fracture. 4. Extensive atherosclerotic changes including coronary artery disease. 5. Small hiatal hernia. 6. No acute intra-abdominal lesion to explain the patient's symptoms.  Assessment/Plan Hypothyroidism Takes Levothyroxine 12.56mcg, 01/16/14 TSH 2.474. 03/24/14 TSH 2.576    Type 2 diabetes mellitus with blindness, with macular edema, with severe nonproliferative retinopathy Diet controlled. Hospice Service. Comfort measures only.    Essential hypertension Controlled, takes Metoprolol  daily and  Amlodipine  daily.      ATRIAL FIBRILLATION Hear rate is in control     Hypertonicity of bladder Incontinent of bladder, adult dependent for care needs. Off med   Dementia 04/03/14 MMSE 22/30. Comfort measures only and Hospice Service for care needs.      Constipation Stable, continue MiraLax daily. Dulcolax, Fleet, and MOM prn available to her.      Depression 04/05/14 her appetite was reportedly poor-weights #116Ibs 04/02/14 and # 117Ibds 04/09/14. Stabilized since  Mirtazapine 7.5 mg since the patient c/o feeling depressed per SW 04/08/14( feeling depressed, loss of interests, restlessness, and feeling like she just wants out of here. Noted flat affect. Said she felt she soul be better off dead but would not hurt herself) 06/30/14 stable, adjusting to MCU well.  10/13/14 flat affect, reportedly poor appetite and continuously gradually losing weight, will increase Mirtazapine to  for better mood/weight management.  11/12/14 seems stabilized, continue Mirtazapine  daily.  12/15/14 decrease Mirtazapine to 7.5mg  daily-the patient continue to decline gradually. Hospice service and comfort measures only.     FTT (failure to thrive) in adult Comfort measures and Hospice Service.      Family/ Staff Communication: observe the patient  Goals of Care: SNF Hospice service.   Labs/tests ordered: none

## 2015-01-21 ENCOUNTER — Non-Acute Institutional Stay (SKILLED_NURSING_FACILITY): Payer: Medicare Other | Admitting: Nurse Practitioner

## 2015-01-21 ENCOUNTER — Encounter: Payer: Self-pay | Admitting: Nurse Practitioner

## 2015-01-21 DIAGNOSIS — I482 Chronic atrial fibrillation, unspecified: Secondary | ICD-10-CM

## 2015-01-21 DIAGNOSIS — F329 Major depressive disorder, single episode, unspecified: Secondary | ICD-10-CM

## 2015-01-21 DIAGNOSIS — E507 Other ocular manifestations of vitamin A deficiency: Secondary | ICD-10-CM

## 2015-01-21 DIAGNOSIS — H540 Blindness, both eyes: Secondary | ICD-10-CM

## 2015-01-21 DIAGNOSIS — E039 Hypothyroidism, unspecified: Secondary | ICD-10-CM

## 2015-01-21 DIAGNOSIS — K5900 Constipation, unspecified: Secondary | ICD-10-CM | POA: Diagnosis not present

## 2015-01-21 DIAGNOSIS — H547 Unspecified visual loss: Secondary | ICD-10-CM

## 2015-01-21 DIAGNOSIS — R627 Adult failure to thrive: Secondary | ICD-10-CM | POA: Diagnosis not present

## 2015-01-21 DIAGNOSIS — I10 Essential (primary) hypertension: Secondary | ICD-10-CM | POA: Diagnosis not present

## 2015-01-21 DIAGNOSIS — E11341 Type 2 diabetes mellitus with severe nonproliferative diabetic retinopathy with macular edema: Secondary | ICD-10-CM | POA: Diagnosis not present

## 2015-01-21 DIAGNOSIS — E113419 Type 2 diabetes mellitus with severe nonproliferative diabetic retinopathy with macular edema, unspecified eye: Secondary | ICD-10-CM

## 2015-01-21 DIAGNOSIS — F0390 Unspecified dementia without behavioral disturbance: Secondary | ICD-10-CM

## 2015-01-21 DIAGNOSIS — F32A Depression, unspecified: Secondary | ICD-10-CM

## 2015-01-21 DIAGNOSIS — N318 Other neuromuscular dysfunction of bladder: Secondary | ICD-10-CM

## 2015-01-21 HISTORY — DX: Other ocular manifestations of vitamin A deficiency: E50.7

## 2015-01-21 NOTE — Assessment & Plan Note (Signed)
Comfort measures and Hospice Service.

## 2015-01-21 NOTE — Assessment & Plan Note (Signed)
Takes Levothyroxine 12.5mcg, 01/16/14 TSH 2.474. 03/24/14 TSH 2.576  

## 2015-01-21 NOTE — Assessment & Plan Note (Signed)
04/03/14 MMSE 22/30. Comfort measures only and Hospice Service for care needs.

## 2015-01-21 NOTE — Assessment & Plan Note (Signed)
Stable, continue MiraLax daily. Dulcolax, Fleet, and MOM prn available to her.     

## 2015-01-21 NOTE — Progress Notes (Signed)
Patient ID: [name], female   DOB: Oct 19, 1914, 81 y.o.   MRN: [id number]   Code Status: DNR  Allergies  Allergen Reactions  . Codeine     unknown  . Ibuprofen     unknown  . Lisinopril     unknown  . Penicillins     unknown  . Philis Nettle [Trospium Chloride]     unknown    Chief Complaint  Patient presents with  . Medical Management of Chronic Issues  . Acute Visit    dry eyes    HPI: Patient is a 81 y.o. female seen in the SNF at [facility] today for evaluation of dry eyes and chronic medical conditions.  Problem List Items Addressed This Visit    Hypothyroidism    Takes Levothyroxine 12.27mcg, 01/16/14 TSH 2.474. 03/24/14 TSH 2.576        Type 2 diabetes mellitus with blindness, with macular edema, with severe nonproliferative retinopathy    Diet controlled. Hospice Service. Comfort measures only.        Essential hypertension    Controlled, takes Metoprolol 50mg  daily and Amlodipine 10mg  daily.         ATRIAL FIBRILLATION    Heart rate is in control        Hypertonicity of bladder    Incontinent of bladder, adult dependent for care needs. Off med       Dementia    04/03/14 MMSE 22/30. Comfort measures only and Hospice Service for care needs.         Constipation    Stable, continue MiraLax daily. Dulcolax, Fleet, and MOM prn available to her.          Depression    04/05/14 her appetite was reportedly poor-weights #116Ibs 04/02/14 and # 117Ibds 04/09/14. Stabilized since  Mirtazapine 7.5 mg since the patient c/o feeling depressed per SW 04/08/14( feeling depressed, loss of interests, restlessness, and feeling like she just wants out of here. Noted flat affect. Said she felt she soul be better off dead but would not hurt herself) 06/30/14 stable, adjusting to MCU well.  10/13/14 flat affect, reportedly poor appetite and continuously gradually losing weight, will increase Mirtazapine to 15mg  for better mood/weight management.  11/12/14 seems  stabilized, continue Mirtazapine 15mg  daily.  12/15/14 decrease Mirtazapine to 7.5mg  daily-the patient continue to decline gradually. Hospice service and comfort measures only.  -gradual decline.         FTT (failure to thrive) in adult    Comfort measures and Hospice Service.        Xerophthalmus - Primary    Artificial tears 1gtt OU 5x/day.          Review of Systems:  Review of Systems  Constitutional: Positive for unexpected weight change. Negative for fever, chills and diaphoresis.  HENT: Positive for hearing loss. Negative for congestion, ear discharge, ear pain, nosebleeds, sore throat and tinnitus.   Eyes: Negative for photophobia, pain, discharge and redness.       Low vision Dry eyes  Respiratory: Negative for cough, shortness of breath, wheezing and stridor.   Cardiovascular: Negative for chest pain, palpitations and leg swelling.  Gastrointestinal: Negative for nausea, vomiting, abdominal pain, diarrhea, constipation and blood in stool.  Endocrine: Negative for polydipsia.  Genitourinary: Positive for frequency. Negative for dysuria, urgency, hematuria and flank pain.  Musculoskeletal: Negative for myalgias, back pain and neck pain.  Skin: Negative for rash.  Allergic/Immunologic: Negative for environmental allergies.  Neurological: Negative for dizziness, tremors, seizures,  weakness and headaches.  Hematological: Does not bruise/bleed easily.  Psychiatric/Behavioral: Negative for suicidal ideas and hallucinations. The patient is nervous/anxious.      Past Medical History  Diagnosis Date  . HTN (hypertension)   . Other and unspecified hyperlipidemia   . DM (diabetes mellitus)   . Hyperthyroidism   . Hemorrhoids   . AF (atrial fibrillation)   . Rectal bleeding   . Sinus node dysfunction   . Hip fracture   . Bradycardia   . Aortic stenosis   . Mitral valve prolapse   . Hearing loss   . Memory loss 02/16/2012  . Disturbance of salivary secretion [id number]   . Unspecified hereditary and idiopathic peripheral neuropathy 10/28/202012  . Closed fracture of unspecified trochanteric section of femur 05/29/2011  . Anxiety state, unspecified 08/19/2010  . Diffuse cystic mastopathy 12/23/2009  . Flatulence, eructation, and gas pain 12/03/2009  . Closed fracture of lumbar vertebra without mention of spinal cord injury 1July 06, 202010  . Personal history of fall 01/29/2009  . Occlusion and stenosis of carotid artery without mention of cerebral infarction 12/27/2007  . Syncope and collapse 12/27/2007  . Other malaise and fatigue 12/06/2007  . Unspecified urinary incontinence 02/22/2005  . Cardiac pacemaker in situ 09/12/2004  . Unspecified hypothyroidism 03/18/2010  . Abnormality of gait 09/08/2003  . Edema 08/18/2003  . Macular degeneration (senile) of retina, unspecified 12/12/2001  . Undiagnosed cardiac murmurs 04/11/2001  . Senile osteoporosis 12/13/2000  . Legal blindness, as defined in [place] 03/20/1999    secondary to macular degeneration  . Fracture of greater trochanter of left femur 03/08/14   Past Surgical History  Procedure Laterality Date  . Pacemaker insertion  2006    [employer] In-Pulse  . Left hip surgery  09/2005  . Right hip surgery  08/2003  . 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 Tonsillectomy    . Breast biopsy Left 1970    benign   Social History:   reports that she has never smoked. She has never used smokeless tobacco. She reports that she does not drink alcohol or use illicit drugs.  Family History  Problem Relation Age of Onset  . Colon cancer Mother   . Cancer Mother     colon  . Heart attack Father   . Heart disease Father     MI  . Heart disease Sister     MI    Medications: Patient's Medications  New Prescriptions   No medications on file  Previous Medications   ACETAMINOPHEN (TYLENOL) 325 MG TABLET    Take 650 mg by mouth every 4 (four) hours as needed for  mild pain or moderate pain.    AMLODIPINE (NORVASC) 10 MG TABLET    Take 10 mg by mouth Daily.   BISACODYL (DULCOLAX) 10 MG SUPPOSITORY    Place 1 suppository (10 mg total) rectally daily as needed for moderate constipation.   HYDROCODONE-ACETAMINOPHEN (NORCO/VICODIN) 5-325 MG PER TABLET    Take 1-2 tablets by mouth every 6 (six) hours as needed for moderate pain.   LEVOTHYROXINE (SYNTHROID, LEVOTHROID) 25 MCG TABLET    Take 12.5 mg by mouth Daily.   MAGNESIUM HYDROXIDE (MILK OF MAGNESIA) 400 MG/5ML SUSPENSION    Take 30 mLs by mouth every other day. Every other day as needed for constipation   METOPROLOL SUCCINATE (TOPROL-XL) 50 MG 24 HR TABLET    Take 50 mg by mouth Daily.  MIRTAZAPINE (REMERON) 7.5 MG TABLET    Take 7.5 mg by mouth at bedtime.   POLYETHYLENE GLYCOL POWDER (GLYCOLAX/MIRALAX) POWDER    Take 17 g by mouth daily.   SODIUM PHOSPHATE (FLEET) 7-19 GM/118ML ENEM    Place 133 mLs (1 enema total) rectally daily as needed for severe constipation.  Modified Medications   No medications on file  Discontinued Medications   No medications on file     Physical Exam: Physical Exam  Constitutional: She is oriented to person, place, and time. She appears well-developed and well-nourished.  HENT:  Head: Normocephalic and atraumatic.  Right Ear: External ear normal.  Left Ear: External ear normal.  Nose: Nose normal.  Mouth/Throat: Oropharynx is clear and moist. No oropharyngeal exudate.  Eyes: Conjunctivae and EOM are normal. Pupils are equal, round, and reactive to light. Right eye exhibits no discharge. Left eye exhibits no discharge. No scleral icterus.  Dry eyes  Neck: Normal range of motion. Neck supple. No JVD present. No thyromegaly present.  Cardiovascular: Normal rate.   Murmur heard. Pacemaker. Systolic ejection murmur 3/6  Pulmonary/Chest: Effort normal and breath sounds normal. No respiratory distress. She has no wheezes. She has no rales. She exhibits no tenderness.    Abdominal: Soft. Bowel sounds are normal. She exhibits no distension. There is no tenderness. There is no rebound.  Musculoskeletal: Normal range of motion. She exhibits no edema or tenderness.  Lymphadenopathy:    She has no cervical adenopathy.  Neurological: She is alert and oriented to person, place, and time. She has normal reflexes. No cranial nerve deficit. She exhibits normal muscle tone. Coordination normal.  Skin: Skin is warm and dry. No rash noted. She is not diaphoretic. No erythema. No pallor.  Psychiatric: Thought content normal. Her mood appears not anxious. Her affect is not angry, not blunt, not labile and not inappropriate. Her speech is not rapid and/or pressured, not delayed and not slurred. She is slowed. She is not agitated, not aggressive, not hyperactive, not withdrawn, not actively hallucinating and not combative. Thought content is not paranoid and not delusional. Cognition and memory are impaired. She expresses impulsivity and inappropriate judgment. She exhibits a depressed mood. She exhibits abnormal recent memory.  Flat affect. Confusion-going to bed before dinner frequently.  She is attentive.    Filed Vitals:   01/21/15 1149  BP: 120/62  Pulse: 66  Temp: 96 F (35.6 C)  TempSrc: Tympanic  Resp: 18      Labs reviewed: Basic Metabolic Panel:  Recent Labs  16/10/96 0849 03/09/14 0425 03/24/14  NA 138 141 140  K 3.9 3.9 4.2  CL 100 103  --   CO2 27 24  --   GLUCOSE 152* 110*  --   BUN 17 19 22*  CREATININE 0.84 0.75 0.9  CALCIUM 9.8 9.1  --   TSH  --   --  2.58   Liver Function Tests:  Recent Labs  03/24/14  AST 18  ALT 9  ALKPHOS 198*   No results for input(s): LIPASE, AMYLASE in the last 8760 hours. No results for input(s): AMMONIA in the last 8760 hours. CBC:  Recent Labs  03/08/14 0849 03/09/14 0425 03/24/14  WBC 11.8* 10.7* 8.5  HGB 13.6 12.9 14.0  HCT 39.6 37.6 40  MCV 86.1 87.2  --   PLT 206 207 386   Lipid Panel: No  results for input(s): CHOL, HDL, LDLCALC, TRIG, CHOLHDL, LDLDIRECT in the last 8760 hours.  Past Procedures:  03/06/14  X-ray pelvis([facility]) no acute osseous abnormality. Chronic degenerative and postsurgical findings(s/p R hip arthroplasty and left femoral neck compression screw with associated proximal femoral sideplate and fixation screws-no hardware loosening or migration visible at imaged components. R inferior ischial and inferior right pubic ramus osseous deformities consistent with previously identified remote fracture injuries)   03/08/14 X-ray lumbar spine:  IMPRESSION: 1. Chronic wedge compression fractures. 2. No evidence for acute abnormality.  03/08/14 CXR  IMPRESSION: Low lung volumes. No active disease.  03/08/14 X-ray pelvis  IMPRESSION: 1. Postoperative changes. 2. No evidence for acute abnormality.  03/08/14 CT head w/o contrast:  IMPRESSION: 1. Atrophy and small vessel disease. 2. No evidence for acute intracranial abnormality. 3. Right posterior parietal scalp hematoma without underlying calvarial fracture. 4. Sinusitis involving the right sphenoid air cell. No evidence for sinus wall fracture  03/08/14 CT pelvis and abd w/o contrast:  IMPRESSION: 1. Nondisplaced fracture along the left greater trochanter is new since prior exam. It is unclear if this is acute or chronic injury. 2. Postoperative changes of both hips. 3. Remote L5 compression fracture. 4. Extensive atherosclerotic changes including coronary artery disease. 5. Small hiatal hernia. 6. No acute intra-abdominal lesion to explain the patient's symptoms.  Assessment/Plan Xerophthalmus Artificial tears 1gtt OU 5x/day.   FTT (failure to thrive) in adult Comfort measures and Hospice Service.    Depression 04/05/14 her appetite was reportedly poor-weights #116Ibs 04/02/14 and # 117Ibds 04/09/14. Stabilized since  Mirtazapine 7.5 mg since the patient c/o feeling depressed per SW 04/08/14( feeling  depressed, loss of interests, restlessness, and feeling like she just wants out of here. Noted flat affect. Said she felt she soul be better off dead but would not hurt herself) 06/30/14 stable, adjusting to [facility] well.  10/13/14 flat affect, reportedly poor appetite and continuously gradually losing weight, will increase Mirtazapine to  for better mood/weight management.  11/12/14 seems stabilized, continue Mirtazapine  daily.  12/15/14 decrease Mirtazapine to 7.5mg  daily-the patient continue to decline gradually. Hospice service and comfort measures only.  -gradual decline.     Constipation Stable, continue MiraLax daily. Dulcolax, Fleet, and MOM prn available to her.      Hypothyroidism Takes Levothyroxine 12.57mcg, 01/16/14 TSH 2.474. 03/24/14 TSH 2.576    Type 2 diabetes mellitus with blindness, with macular edema, with severe nonproliferative retinopathy Diet controlled. Hospice Service. Comfort measures only.    Essential hypertension Controlled, takes Metoprolol  daily and Amlodipine  daily.     ATRIAL FIBRILLATION Heart rate is in control    Hypertonicity of bladder Incontinent of bladder, adult dependent for care needs. Off med   Dementia 04/03/14 MMSE 22/30. Comfort measures only and Hospice Service for care needs.       Family/ Staff Communication: observe the patient  Goals of Care: SNF Hospice service.   Labs/tests ordered: none

## 2015-01-21 NOTE — Assessment & Plan Note (Signed)
Artificial tears 1gtt OU 5x/day.

## 2015-01-21 NOTE — Assessment & Plan Note (Signed)
Incontinent of bladder, adult dependent for care needs. Off med

## 2015-01-21 NOTE — Assessment & Plan Note (Signed)
Heart rate is in control.  

## 2015-01-21 NOTE — Assessment & Plan Note (Signed)
04/05/14 her appetite was reportedly poor-weights #116Ibs 04/02/14 and # 117Ibds 04/09/14. Stabilized since  Mirtazapine 7.5 mg since the patient c/o feeling depressed per SW 04/08/14( feeling depressed, loss of interests, restlessness, and feeling like she just wants out of here. Noted flat affect. Said she felt she soul be better off dead but would not hurt herself) 06/30/14 stable, adjusting to [facility]U well.  10/13/14 flat affect, reportedly poor appetite and continuously gradually losing weight, will increase Mirtazapine to 15mg  for better mood/weight management.  11/12/14 seems stabilized, continue Mirtazapine 15mg  daily.  12/15/14 decrease Mirtazapine to 7.5mg  daily-the patient continue to decline gradually. Hospice service and comfort measures only.  -gradual decline.

## 2015-01-21 NOTE — Assessment & Plan Note (Signed)
Controlled, takes Metoprolol 50mg  daily and Amlodipine 10mg  daily.

## 2015-01-21 NOTE — Assessment & Plan Note (Signed)
Diet controlled. Hospice Service. Comfort measures only.

## 2015-01-26 ENCOUNTER — Non-Acute Institutional Stay (SKILLED_NURSING_FACILITY): Payer: Medicare Other | Admitting: Internal Medicine

## 2015-01-26 ENCOUNTER — Encounter: Payer: Self-pay | Admitting: Internal Medicine

## 2015-01-26 DIAGNOSIS — E039 Hypothyroidism, unspecified: Secondary | ICD-10-CM

## 2015-01-26 DIAGNOSIS — R627 Adult failure to thrive: Secondary | ICD-10-CM | POA: Diagnosis not present

## 2015-01-26 DIAGNOSIS — E113299 Type 2 diabetes mellitus with mild nonproliferative diabetic retinopathy without macular edema, unspecified eye: Secondary | ICD-10-CM

## 2015-01-26 DIAGNOSIS — R634 Abnormal weight loss: Secondary | ICD-10-CM

## 2015-01-26 DIAGNOSIS — F32A Depression, unspecified: Secondary | ICD-10-CM

## 2015-01-26 DIAGNOSIS — I10 Essential (primary) hypertension: Secondary | ICD-10-CM | POA: Diagnosis not present

## 2015-01-26 DIAGNOSIS — H540 Blindness, both eyes: Secondary | ICD-10-CM

## 2015-01-26 DIAGNOSIS — F0390 Unspecified dementia without behavioral disturbance: Secondary | ICD-10-CM

## 2015-01-26 DIAGNOSIS — H548 Legal blindness, as defined in USA: Secondary | ICD-10-CM

## 2015-01-26 DIAGNOSIS — E11319 Type 2 diabetes mellitus with unspecified diabetic retinopathy without macular edema: Secondary | ICD-10-CM

## 2015-01-26 DIAGNOSIS — H547 Unspecified visual loss: Secondary | ICD-10-CM

## 2015-01-26 DIAGNOSIS — I482 Chronic atrial fibrillation, unspecified: Secondary | ICD-10-CM

## 2015-01-26 DIAGNOSIS — F329 Major depressive disorder, single episode, unspecified: Secondary | ICD-10-CM | POA: Diagnosis not present

## 2015-01-26 DIAGNOSIS — E113419 Type 2 diabetes mellitus with severe nonproliferative diabetic retinopathy with macular edema, unspecified eye: Secondary | ICD-10-CM

## 2015-01-26 DIAGNOSIS — E11341 Type 2 diabetes mellitus with severe nonproliferative diabetic retinopathy with macular edema: Secondary | ICD-10-CM

## 2015-01-26 NOTE — Progress Notes (Signed)
Patient ID: [name]. Cragg, female   DOB: 1914-08-10, 81 y.o.   MRN: [id number]    HISTORY AND PHYSICAL  Location:  [facility]  Nursing Home Room Number: 105 Place of Service: SNF [phone])   Extended Emergency Contact Information Primary Emergency Contact: Cook,[name] Address: [address]          [place], [place] [address] [place] Home Phone: [phone] Work Phone: [phone] Mobile Phone: [phone] Relation: Son Secondary Emergency Contact: Totaro,[name] Address: [address]          [place], [place] [address] [place] Home Phone: [phone] Relation: Relative  Advanced Directive information Does patient have an advance directive?: Yes, Type of Advance Directive: Living will;Out of facility DNR (pink MOST or yellow form), Pre-existing out of facility DNR order (yellow form or pink MOST form): Yellow form placed in chart (order not valid for inpatient use);Pink MOST form placed in chart (order not valid for inpatient use), Does patient want to make changes to advanced directive?: No - Patient declined  Chief Complaint  Patient presents with  . Medical Management of Chronic Issues    HPI:  Patient had a significant decline with weight loss and difficulty arousing her 3 months ago. She was put on hospice services at that time. Initially the family declined stopping any of her medications, but did approve this a few weeks ago. As a consequence, her metformin, levothyroxine, and all other drugs except for eyedrops have been stopped. Since stopping medications, her son believes that she has made a "remarkable" turnaround. She is now much more alert and fidgety. She is eating much better. Staff tells me she ate for bananas today prior to lunch. She has regained a couple of pounds that she lost. Weight loss has been substantial. She was 135 pounds in January 2015 and dropped to 121 pounds 07/30/2014. On 01/15/2015 she weighed 94.6 pounds.  Past Medical  History  Diagnosis Date  . HTN (hypertension)   . Other and unspecified hyperlipidemia   . Hemorrhoids   . AF (atrial fibrillation)   . Rectal bleeding   . Sinus node dysfunction   . Hip fracture   . Bradycardia   . Aortic stenosis   . Mitral valve prolapse   . Hearing loss   . Disturbance of salivary secretion [id number]  . Unspecified hereditary and idiopathic peripheral neuropathy 10/28/202012  . Closed fracture of unspecified trochanteric section of femur 05/29/2011  . Anxiety state, unspecified 08/19/2010  . Diffuse cystic mastopathy 12/23/2009  . Flatulence, eructation, and gas pain 12/03/2009  . Closed fracture of lumbar vertebra without mention of spinal cord injury 106-28-2010  . Personal history of fall 01/29/2009  . Occlusion and stenosis of carotid artery without mention of cerebral infarction 12/27/2007  . Syncope and collapse 12/27/2007  . Other malaise and fatigue 12/06/2007  . Unspecified urinary incontinence 02/22/2005  . Cardiac pacemaker in situ 09/12/2004  . Unspecified hypothyroidism 03/18/2010  . Abnormality of gait 09/08/2003  . Edema 08/18/2003  . Macular degeneration (senile) of retina, unspecified 12/12/2001  . Senile osteoporosis 12/13/2000  . Legal blindness, as defined in [place] 03/20/1999    secondary to macular degeneration  . Fracture of greater trochanter of left femur 03/08/14  . Atrial fibrillation 07/14/2009    Qualifier: Diagnosis of  By: [name], MD, Susie Cassette   . Constipation 03/14/2014  . Dementia 2013    04/03/14 MMSE 22/30   . Depression 04/10/2014  . Fall 12/19/2012  07/22/14 fall VS 144/76, 60, 20, 97.1. No apparent injury   . FTT (failure to thrive) in adult 12/15/2014  . Hypothyroidism 12/19/2007    Qualifier: Diagnosis of  By: Melvyn Neth CMA (AAMA), Patty  01/16/14 TSH 2.474 03/24/14 TSH 2.576    . Loss of weight 10/13/2014    Multiple factorials: dementia, depression, advanced age, possible AR of medications(metformin and Oxybutynin)-will increase  Mirtazapine and continue supplement. Observe.    . Macular degeneration of both eyes 01/24/2013    Severe visual impairment. Nearly blind.   Margie Ege  medtronic 09/07/2011  . Trochanteric fracture of left femur 03/14/2014    CT scan did however reveal acute fracture in greater left trochanteric. Dr. Margarita Rana consulted. Recommended nonoperative management and weightbearing as tolerated and another 3 weeks of Lovenox for DVT prophylaxis.       . Type 2 diabetes mellitus with blindness, with macular edema, with severe nonproliferative retinopathy 12/19/2007    Qualifier: Diagnosis of  By: Melvyn Neth CMA (AAMA), Patty  01/09/14 Hgb A1c 6.5 01/16/14 Hgb A1c 6.5 03/24/14 Hgb 6.4 12/10/14 pharm: dc Metformin and CBG monitoring-failure to thrive and refusing to eat.     . Urinary tract infection, site not specified 04/10/2014  . Xerophthalmus 01/21/2015    Past Surgical History  Procedure Laterality Date  . Pacemaker insertion  2006    Medtronic In-Pulse  . Left hip surgery  09/2005  . Right hip surgery  08/2003  . 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 Tonsillectomy    . Breast biopsy Left 1970    benign    Patient Care Team: Kimber Relic, MD as PCP - General (Internal Medicine) Man Mast X, NP as Nurse Practitioner (Nurse Practitioner) [facility] Harle Stanford., MD as Consulting Physician (Urology) Rachael Fee, MD as Consulting Physician (Gastroenterology) Salvatore Marvel, MD as Consulting Physician (Orthopedic Surgery) Venancio Poisson, MD as Consulting Physician (Dermatology) Sheral Apley, MD as Attending Physician (Orthopedic Surgery)  History   Social History  . Marital Status: Widowed    Spouse Name: N/A  . Number of Children: N/A  . Years of Education: N/A   Occupational History  . Not on file.   Social History Main Topics  . Smoking status: Never Smoker   . Smokeless tobacco:  Never Used  . Alcohol Use: No  . Drug Use: No  . Sexual Activity: No   Other Topics Concern  . Not on file   Social History Narrative   Lives at [facility] since 2010 [facility]   Widowed   Never smoked   Alcohol none   Exercise none   Walks with walker   DNR/ Living Will     reports that she has never smoked. She has never used smokeless tobacco. She reports that she does not drink alcohol or use illicit drugs.  Family History  Problem Relation Age of Onset  . Colon cancer Mother   . Cancer Mother     colon  . Heart attack Father   . Heart disease Father     MI  . Heart disease Sister     MI   Family Status  Relation Status Death Age  . Mother Deceased   . Father Deceased   . Sister Deceased   . Sister Deceased     murdered  . Son Alive   . Daughter Biomedical engineer  History  Administered Date(s) Administered  . Influenza-Unspecified 06/06/2013  . PPD Test 07/23/2011  . Pneumococcal Polysaccharide-23 03/14/2009  . Td 03/14/2009  . Zoster 06/29/2006    Allergies  Allergen Reactions  . Codeine     unknown  . Ibuprofen     unknown  . Lisinopril     unknown  . Penicillins     unknown  . Sanctura [Trospium Chloride]     unknown    Medications: Patient's Medications  New Prescriptions   No medications on file  Previous Medications   ACETAMINOPHEN (TYLENOL) 325 MG TABLET    Take 650 mg by mouth every 4 (four) hours as needed for mild pain or moderate pain.    AMLODIPINE (NORVASC) 10 MG TABLET    Take 10 mg by mouth Daily.   BISACODYL (DULCOLAX) 10 MG SUPPOSITORY    Place 1 suppository (10 mg total) rectally daily as needed for moderate constipation.   HYDROCODONE-ACETAMINOPHEN (NORCO/VICODIN) 5-325 MG PER TABLET    Take 1-2 tablets by mouth every 6 (six) hours as needed for moderate pain.   LEVOTHYROXINE (SYNTHROID, LEVOTHROID) 25 MCG TABLET    Take 12.5 mg by mouth Daily.   MAGNESIUM HYDROXIDE (MILK OF MAGNESIA) 400 MG/5ML SUSPENSION     Take 30 mLs by mouth every other day. Every other day as needed for constipation   METOPROLOL SUCCINATE (TOPROL-XL) 50 MG 24 HR TABLET    Take 50 mg by mouth Daily.   MIRTAZAPINE (REMERON) 7.5 MG TABLET    Take 7.5 mg by mouth at bedtime.   POLYETHYLENE GLYCOL POWDER (GLYCOLAX/MIRALAX) POWDER    Take 17 g by mouth daily.   SODIUM PHOSPHATE (FLEET) 7-19 GM/118ML ENEM    Place 133 mLs (1 enema total) rectally daily as needed for severe constipation.  Modified Medications   No medications on file  Discontinued Medications   No medications on file    Review of Systems  Constitutional: Positive for unexpected weight change. Negative for fever, chills and diaphoresis.       Emaciated  HENT: Positive for hearing loss. Negative for congestion, ear discharge, ear pain, nosebleeds, sore throat and tinnitus.   Eyes: Negative for photophobia, pain, discharge and redness.       Low vision Dry eyes  Respiratory: Negative for cough, shortness of breath, wheezing and stridor.   Cardiovascular: Negative for chest pain, palpitations and leg swelling.  Gastrointestinal: Negative for nausea, vomiting, abdominal pain, diarrhea, constipation and blood in stool.  Endocrine: Negative for polydipsia.  Genitourinary: Positive for frequency. Negative for dysuria, urgency, hematuria and flank pain.  Musculoskeletal: Negative for myalgias, back pain and neck pain.  Skin: Negative for rash.  Allergic/Immunologic: Negative for environmental allergies.  Neurological: Negative for dizziness, tremors, seizures, weakness and headaches.  Hematological: Does not bruise/bleed easily.  Psychiatric/Behavioral: Positive for agitation. Negative for suicidal ideas and hallucinations. The patient is nervous/anxious.     Filed Vitals:   01/26/15 1453  BP: 120/62  Pulse: 66  Temp: 96.8 F (36 C)  Resp: 18  Height: 5\' 2"  (1.575 m)  Weight: 94 lb 9.6 oz (42.91 kg)   Body mass index is 17.3 kg/(m^2).  Physical Exam    Constitutional: She is oriented to person, place, and time.  Extremely thin  HENT:  Head: Normocephalic and atraumatic.  Right Ear: External ear normal.  Left Ear: External ear normal.  Nose: Nose normal.  Mouth/Throat: Oropharynx is clear and moist. No oropharyngeal exudate.  Eyes: Conjunctivae and EOM are  normal. Pupils are equal, round, and reactive to light. Right eye exhibits no discharge. Left eye exhibits no discharge. No scleral icterus.  Dry eyes  Neck: Normal range of motion. Neck supple. No JVD present. No thyromegaly present.  Cardiovascular: Normal rate.   Murmur heard. Pacemaker. Systolic ejection murmur 3/6  Pulmonary/Chest: Effort normal and breath sounds normal. No respiratory distress. She has no wheezes. She has no rales. She exhibits no tenderness.  Abdominal: Soft. Bowel sounds are normal. She exhibits no distension. There is no tenderness. There is no rebound.  Musculoskeletal: Normal range of motion. She exhibits no edema or tenderness.  Lymphadenopathy:    She has no cervical adenopathy.  Neurological: She is alert and oriented to person, place, and time. She has normal reflexes. No cranial nerve deficit. She exhibits normal muscle tone. Coordination normal.  Skin: Skin is warm and dry. No rash noted. She is not diaphoretic. No erythema. No pallor.  Psychiatric: Thought content normal. Her mood appears not anxious. Her affect is not angry, not blunt, not labile and not inappropriate. Her speech is not rapid and/or pressured, not delayed and not slurred. She is slowed. She is not agitated, not aggressive, not hyperactive, not withdrawn, not actively hallucinating and not combative. Thought content is not paranoid and not delusional. Cognition and memory are impaired. She expresses impulsivity and inappropriate judgment. She exhibits a depressed mood. She exhibits abnormal recent memory.  Confusion-going to bed before dinner frequently.  Frequent calling out. Today she  wants a banana. Fidgety and agitated on examination. Guards against examination. She is attentive.     Labs reviewed: No visits with results within 3 Month(s) from this visit. Latest known visit with results is:  Clinical Support on 08/18/2014  Component Date Value Ref Range Status  . Date Time Interrogation Session 08/19/2014 [id number]   Final  . Implantable Pulse Generator [place]* 08/19/2014 Medtronic   Final  . Implantable Pulse Generator Model 08/19/2014 [id number] EnPulse   Final  . Implantable Pulse Generator Serial* 08/19/2014 [id number] H   Final  . Lead Channel Setting Sensing Sensi* 08/19/2014 4   Final  . Lead Channel Setting Pacing Pulse * 08/19/2014 0.4   Final  . Lead Channel Setting Pacing Amplit* 08/19/2014 2   Final  . Lead Channel Impedance Value 08/19/2014 67   Final  . Lead Channel Impedance Value 08/19/2014 748   Final  . Battery Status 08/19/2014 Unknown   Final  . Battery Remaining Longevity 08/19/2014 5   Final  . Battery Voltage 08/19/2014 2.62   Final  . Battery Impedance 08/19/2014 6789   Final  . Huston Foley Statistic RV Percent Paced 08/19/2014 0.2   Final  . Eval Rhythm 08/19/2014 Vp   Final  . Miscellaneous Comment 08/19/2014    Final                   Value:Pacemaker remote check. Device has reached ERI 08/12/14 and has reverted to VVI 65.  Previous interrogations show atrial pacing percentage in the 90's.  Patient is in [facility] and is difficult to transport.  We will discuss all the change out options  with Dr. [name].      Assessment/Plan  1. Chronic atrial fibrillation Rate controlled off medication  2. Legal blindness, as defined in [place] Chronic problem which is unchanged  3. FTT (failure to thrive) in adult Is possible that this condition has resolved itself.  4. Loss of weight Substantial weight loss in the last 18 months, but  a 2 pound gain in the last month.  5. Dementia, without behavioral disturbance Much more significant than when I  last saw her. She is not on medication at this time.  6. Depression currently off all medications  7. Essential hypertension Controlled off medication  8. Hypothyroidism, unspecified hypothyroidism type Levothyroxine was stopped 12/30/2014.  9. DM type 2 with diabetic background retinopathy At [facility] was stopped 12/30/2014  10. Type 2 diabetes mellitus with blindness, with macular edema, with severe nonproliferative retinopathy No recent lab work   I discussed the situation with her son by telephone today. He is agreeable to updating her lab work and then deciding what medication she might need to return to. I have ordered CBC, CMP, A1c, and TSH.

## 2015-01-27 LAB — BASIC METABOLIC PANEL
BUN: 11 mg/dL (ref 4–21)
Creatinine: 0.9 mg/dL (ref 0.5–1.1)
Glucose: 109 mg/dL
POTASSIUM: 4.3 mmol/L (ref 3.4–5.3)
Sodium: 141 mmol/L (ref 137–147)

## 2015-01-27 LAB — HEMOGLOBIN A1C: HEMOGLOBIN A1C: 5.8 % (ref 4.0–6.0)

## 2015-01-27 LAB — TSH: TSH: 3.55 u[IU]/mL (ref 0.41–5.90)

## 2015-01-27 LAB — CBC AND DIFFERENTIAL
HEMATOCRIT: 45 % (ref 36–46)
Hemoglobin: 15.3 g/dL (ref 12.0–16.0)
PLATELETS: 427 10*3/uL — AB (ref 150–399)
WBC: 10.8 10^3/mL

## 2015-01-27 LAB — HEPATIC FUNCTION PANEL
ALT: 10 U/L (ref 7–35)
AST: 24 U/L (ref 13–35)
Alkaline Phosphatase: 80 U/L (ref 25–125)
Bilirubin, Total: 0.8 mg/dL

## 2015-01-28 ENCOUNTER — Other Ambulatory Visit: Payer: Self-pay | Admitting: Nurse Practitioner

## 2015-01-28 DIAGNOSIS — H547 Unspecified visual loss: Secondary | ICD-10-CM

## 2015-01-28 DIAGNOSIS — E039 Hypothyroidism, unspecified: Secondary | ICD-10-CM

## 2015-01-28 DIAGNOSIS — R627 Adult failure to thrive: Secondary | ICD-10-CM

## 2015-01-28 DIAGNOSIS — E113419 Type 2 diabetes mellitus with severe nonproliferative diabetic retinopathy with macular edema, unspecified eye: Secondary | ICD-10-CM

## 2015-01-28 DIAGNOSIS — R634 Abnormal weight loss: Secondary | ICD-10-CM

## 2015-01-28 NOTE — Assessment & Plan Note (Signed)
01/27/15 albumin 3.4

## 2015-04-15 ENCOUNTER — Non-Acute Institutional Stay (SKILLED_NURSING_FACILITY): Payer: Medicare Other | Admitting: Nurse Practitioner

## 2015-04-15 ENCOUNTER — Encounter: Payer: Self-pay | Admitting: Nurse Practitioner

## 2015-04-15 DIAGNOSIS — F329 Major depressive disorder, single episode, unspecified: Secondary | ICD-10-CM

## 2015-04-15 DIAGNOSIS — H540 Blindness, both eyes: Secondary | ICD-10-CM | POA: Diagnosis not present

## 2015-04-15 DIAGNOSIS — I480 Paroxysmal atrial fibrillation: Secondary | ICD-10-CM

## 2015-04-15 DIAGNOSIS — E113419 Type 2 diabetes mellitus with severe nonproliferative diabetic retinopathy with macular edema, unspecified eye: Secondary | ICD-10-CM

## 2015-04-15 DIAGNOSIS — E11341 Type 2 diabetes mellitus with severe nonproliferative diabetic retinopathy with macular edema: Secondary | ICD-10-CM

## 2015-04-15 DIAGNOSIS — E039 Hypothyroidism, unspecified: Secondary | ICD-10-CM | POA: Diagnosis not present

## 2015-04-15 DIAGNOSIS — N318 Other neuromuscular dysfunction of bladder: Secondary | ICD-10-CM

## 2015-04-15 DIAGNOSIS — F32A Depression, unspecified: Secondary | ICD-10-CM

## 2015-04-15 DIAGNOSIS — K5900 Constipation, unspecified: Secondary | ICD-10-CM | POA: Diagnosis not present

## 2015-04-15 DIAGNOSIS — F0390 Unspecified dementia without behavioral disturbance: Secondary | ICD-10-CM

## 2015-04-15 DIAGNOSIS — I10 Essential (primary) hypertension: Secondary | ICD-10-CM

## 2015-04-15 DIAGNOSIS — H547 Unspecified visual loss: Secondary | ICD-10-CM

## 2015-04-15 NOTE — Progress Notes (Signed)
Patient ID: [name]. [name], female   DOB: 1915/01/30, 81 y.o.   MRN: [id number]  Location:  SNF [facility] Provider:  Chipper Oman NP  Code Status:  DNR Goals of care: Advanced Directive information    Chief Complaint  Patient presents with  . Medical Management of Chronic Issues     HPI: Patient is a 81 y.o. female seen in the SNF at [facility] today for evaluation of chronic medical conditions. The patient was admitted to Hospice Service due to her FTT, but her weight has been stabilized in the past 3-4 months. Hx of hypothyroidism, last TSH wnl 12/2014 while on Levothyroxine 12.. . Her blood sugar is controlled on diet, last Hgb A1c 5.8 12/2014. Blood pressure and heart rate are controlled on metoprolol. Her mood is stable on Mirtazapine, dementia requires SNF MCU for care needs, her urinary incontinent is chronic and managed with adult depends. She has BM daily. She is still able to feed self at meals.   Review of Systems:  Review of Systems  Constitutional: Negative for fever and chills.       Emaciated, weight has been stabilized past 3-4 months  HENT: Positive for hearing loss. Negative for congestion, ear discharge and ear pain.   Eyes: Negative for pain, discharge and redness.       Low vision Dry eyes  Respiratory: Negative for cough, shortness of breath, wheezing and stridor.   Cardiovascular: Negative for chest pain, palpitations and leg swelling.  Gastrointestinal: Negative for nausea, vomiting, abdominal pain and constipation.  Genitourinary: Positive for frequency. Negative for dysuria and urgency.  Musculoskeletal: Negative for myalgias, back pain and neck pain.  Skin: Negative for rash.  Neurological: Negative for dizziness, tremors, seizures, weakness and headaches.  Psychiatric/Behavioral: Positive for memory loss. Negative for suicidal ideas and hallucinations. The patient is nervous/anxious.     Past Medical History  Diagnosis Date  . HTN (hypertension)     . Other and unspecified hyperlipidemia   . Hemorrhoids   . AF (atrial fibrillation)   . Rectal bleeding   . Sinus node dysfunction   . Hip fracture   . Bradycardia   . Aortic stenosis   . Mitral valve prolapse   . Hearing loss   . Disturbance of salivary secretion [id number]  . Unspecified hereditary and idiopathic peripheral neuropathy 10/28/202012  . Closed fracture of unspecified trochanteric section of femur 05/29/2011  . Anxiety state, unspecified 08/19/2010  . Diffuse cystic mastopathy 12/23/2009  . Flatulence, eructation, and gas pain 12/03/2009  . Closed fracture of lumbar vertebra without mention of spinal cord injury 105-27-2010  . Personal history of fall 01/29/2009  . Occlusion and stenosis of carotid artery without mention of cerebral infarction 12/27/2007  . Syncope and collapse 12/27/2007  . Other malaise and fatigue 12/06/2007  . Unspecified urinary incontinence 02/22/2005  . Cardiac pacemaker in situ 09/12/2004  . Unspecified hypothyroidism 03/18/2010  . Abnormality of gait 09/08/2003  . Edema 08/18/2003  . Macular degeneration (senile) of retina, unspecified 12/12/2001  . Senile osteoporosis 12/13/2000  . Legal blindness, as defined in [place] 03/20/1999    secondary to macular degeneration  . Fracture of greater trochanter of left femur 03/08/14  . Atrial fibrillation 07/14/2009    Qualifier: Diagnosis of  By: [name], MD, Susie Cassette   . Constipation 03/14/2014  . Dementia 2013    04/03/14 MMSE 22/30   . Depression 04/10/2014  . Fall 12/19/2012    07/22/14 fall VS 144/76, 60, 20, 97.1.  No apparent injury   . FTT (failure to thrive) in adult 12/15/2014  . Hypothyroidism 12/19/2007    Qualifier: Diagnosis of  By: Melvyn Neth CMA (AAMA), Patty  01/16/14 TSH 2.474 03/24/14 TSH 2.576    . Loss of weight 10/13/2014    Multiple factorials: dementia, depression, advanced age, possible AR of medications(metformin and Oxybutynin)-will increase Mirtazapine and continue supplement. Observe.    .  Macular degeneration of both eyes 01/24/2013    Severe visual impairment. Nearly blind.   Margie Ege  medtronic 09/07/2011  . Trochanteric fracture of left femur 03/14/2014    CT scan did however reveal acute fracture in greater left trochanteric. Dr. Margarita Rana consulted. Recommended nonoperative management and weightbearing as tolerated and another 3 weeks of Lovenox for DVT prophylaxis.       . Type 2 diabetes mellitus with blindness, with macular edema, with severe nonproliferative retinopathy 12/19/2007    Qualifier: Diagnosis of  By: Melvyn Neth CMA (AAMA), Patty  01/09/14 Hgb A1c 6.5 01/16/14 Hgb A1c 6.5 03/24/14 Hgb 6.4 12/10/14 pharm: dc Metformin and CBG monitoring-failure to thrive and refusing to eat.     . Urinary tract infection, site not specified 04/10/2014  . Xerophthalmus 01/21/2015    Patient Active Problem List   Diagnosis Date Noted  . Xerophthalmus 01/21/2015  . FTT (failure to thrive) in adult 12/15/2014  . Loss of weight 10/13/2014  . Depression 04/10/2014  . DM type 2 with diabetic background retinopathy 04/10/2014  . Constipation 03/14/2014  . Trochanteric fracture of left femur 03/14/2014  . Fracture of greater trochanter of left femur 03/10/2014  . Dementia 03/09/2014  . Macular degeneration of both eyes 01/24/2013  . Fall 12/19/2012  . DNR (do not resuscitate) 12/19/2012  . 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 Pacemaker-dual  medtronic 09/07/2011  . ATRIAL FIBRILLATION 07/14/2009  . RECTAL BLEEDING 12/21/2007  . Hypothyroidism 12/19/2007  . Type 2 diabetes mellitus with blindness, with macular edema, with severe nonproliferative retinopathy 12/19/2007  . HYPERLIPIDEMIA 12/19/2007  . Essential hypertension 12/19/2007  . Legal blindness, as defined in [place] 03/20/1999    Allergies  Allergen Reactions  . Codeine     unknown  . Ibuprofen     unknown  . Lisinopril     unknown  . Penicillins     unknown  . Sanctura [Trospium Chloride]     unknown     Medications: Patient's Medications  New Prescriptions   No medications on file  Previous Medications   ACETAMINOPHEN (TYLENOL) 325 MG TABLET    Take 650 mg by mouth every 4 (four) hours as needed for mild pain or moderate pain.    AMLODIPINE (NORVASC) 10 MG TABLET    Take 10 mg by mouth Daily.   BISACODYL (DULCOLAX) 10 MG SUPPOSITORY    Place 1 suppository (10 mg total) rectally daily as needed for moderate constipation.   HYDROCODONE-ACETAMINOPHEN (NORCO/VICODIN) 5-325 MG PER TABLET    Take 1-2 tablets by mouth every 6 (six) hours as needed for moderate pain.   LEVOTHYROXINE (SYNTHROID, LEVOTHROID) 25 MCG TABLET    Take 12.5 mg by mouth Daily.   MAGNESIUM HYDROXIDE (MILK OF MAGNESIA) 400 MG/5ML SUSPENSION    Take 30 mLs by mouth every other day. Every other day as needed for constipation   METOPROLOL SUCCINATE (TOPROL-XL) 50 MG 24 HR TABLET    Take 50 mg by mouth Daily.   MIRTAZAPINE (REMERON) 7.5 MG TABLET    Take 7.5 mg by mouth at bedtime.  POLYETHYLENE GLYCOL POWDER (GLYCOLAX/MIRALAX) POWDER    Take 17 g by mouth daily.   SODIUM PHOSPHATE (FLEET) 7-19 GM/118ML ENEM    Place 133 mLs (1 enema total) rectally daily as needed for severe constipation.  Modified Medications   No medications on file  Discontinued Medications   No medications on file    Physical Exam: Filed Vitals:   04/15/15 1426  BP: 130/70  Pulse: 70  Temp: 98 F (36.7 C)  TempSrc: Tympanic  Resp: 18   There is no weight on file to calculate BMI.  Physical Exam  Constitutional: She is oriented to person, place, and time.  Extremely thin  HENT:  Head: Normocephalic and atraumatic.  Right Ear: External ear normal.  Left Ear: External ear normal.  Nose: Nose normal.  Mouth/Throat: Oropharynx is clear and moist. No oropharyngeal exudate.  Eyes: Conjunctivae and EOM are normal. Pupils are equal, round, and reactive to light. Right eye exhibits no discharge. Left eye exhibits no discharge. No scleral  icterus.  Dry eyes  Neck: Normal range of motion. Neck supple. No JVD present. No thyromegaly present.  Cardiovascular: Normal rate.   Murmur heard. Pacemaker. Systolic ejection murmur 3/6  Pulmonary/Chest: Effort normal and breath sounds normal. No respiratory distress. She has no wheezes. She has no rales. She exhibits no tenderness.  Abdominal: Soft. Bowel sounds are normal. She exhibits no distension. There is no tenderness. There is no rebound.  Musculoskeletal: Normal range of motion. She exhibits no edema or tenderness.  Lymphadenopathy:    She has no cervical adenopathy.  Neurological: She is alert and oriented to person, place, and time. She has normal reflexes. No cranial nerve deficit. She exhibits normal muscle tone. Coordination normal.  Skin: Skin is warm and dry. No rash noted. She is not diaphoretic. No erythema. No pallor.  Psychiatric: Thought content normal. Her mood appears not anxious. Her affect is not angry, not blunt, not labile and not inappropriate. Her speech is not rapid and/or pressured, not delayed and not slurred. She is slowed. She is not agitated, not aggressive, not hyperactive, not withdrawn, not actively hallucinating and not combative. Thought content is not paranoid and not delusional. Cognition and memory are impaired. She expresses impulsivity and inappropriate judgment. She exhibits a depressed mood. She exhibits abnormal recent memory.  Confusion, HOH, very low vision. Guards against examination. She is attentive.    Labs reviewed: Basic Metabolic Panel:  Recent Labs  21/30/86  NA 141  K 4.3  BUN 11  CREATININE 0.9    Liver Function Tests:  Recent Labs  01/27/15  AST 24  ALT 10  ALKPHOS 80    CBC:  Recent Labs  01/27/15  WBC 10.8  HGB 15.3  HCT 45  PLT 427*    Lab Results  Component Value Date   TSH 3.55 01/27/2015   Lab Results  Component Value Date   HGBA1C 5.8 01/27/2015   Lab Results  Component Value Date   CHOL  140 12/20/2012   HDL 38 12/20/2012   LDLCALC 57 12/20/2012   TRIG 227* 12/20/2012   CHOLHDL 5.0 12/22/2007    Significant Diagnostic Results since last visit: none  Patient Care Team: Kimber Relic, MD as PCP - General (Internal Medicine) Haron Beilke X, NP as Nurse Practitioner (Nurse Practitioner) [facility] Harle Stanford., MD as Consulting Physician (Urology) Rachael Fee, MD as Consulting Physician (Gastroenterology) Salvatore Marvel, MD as Consulting Physician (Orthopedic Surgery) Venancio Poisson, MD as Consulting  Physician (Dermatology) Sheral Apley, MD as Attending Physician (Orthopedic Surgery)  Assessment/Plan Problem List Items Addressed This Visit    Hypothyroidism - Primary (Chronic)    Last TSH wnl 3.554 12/2014, continue Levothyroxine 12.7mcg      Type 2 diabetes mellitus with blindness, with macular edema, with severe nonproliferative retinopathy (Chronic)    Diet controlled. Hospice Service. Comfort measures only. Last Hgb a1c 5.8 12/2014      Essential hypertension (Chronic)    Controlled, continue Metoprolol 50mg  daily and Amlodipine 10mg  daily.      Dementia (Chronic)    04/03/14 MMSE 22/30.  Comfort measures only and Hospice Service for care needs. Stabilized in the past 3-4 months, able to feed herself.       Depression (Chronic)    Mood is stable, continue Mirtazapine.       ATRIAL FIBRILLATION    Heart rate is in control, continue Metoprolol.       Hypertonicity of bladder    Incontinent of bladder, adult dependent for care needs. Off med      Constipation    Stable, continue MiraLax daily. Dulcolax, Fleet, and MOM prn available to her          Family/ staff Communication: continue SNF MCU for care needs.   Labs/tests ordered: none  ManXie Riel Hirschman NP Geriatrics [facility] [address], [place] [address] On Call:  [phone] & follow prompts after 5pm & weekends Office  Phone:  [phone] Office Fax:  [phone]

## 2015-04-15 NOTE — Assessment & Plan Note (Signed)
Stable, continue MiraLax daily. Dulcolax, Fleet, and MOM prn available to her.     

## 2015-04-15 NOTE — Assessment & Plan Note (Signed)
Heart rate is in control, continue Metoprolol  

## 2015-04-15 NOTE — Assessment & Plan Note (Signed)
04/03/14 MMSE 22/30.  Comfort measures only and Hospice Service for care needs. Stabilized in the past 3-4 months, able to feed herself.

## 2015-04-15 NOTE — Assessment & Plan Note (Signed)
Last TSH wnl 3.554 12/2014, continue Levothyroxine 12. 

## 2015-04-15 NOTE — Assessment & Plan Note (Signed)
Diet controlled. Hospice Service. Comfort measures only. Last Hgb a1c 5.8 12/2014

## 2015-04-15 NOTE — Assessment & Plan Note (Signed)
Incontinent of bladder, adult dependent for care needs. Off med 

## 2015-04-15 NOTE — Assessment & Plan Note (Signed)
Controlled, continue Metoprolol  daily and Amlodipine  daily.

## 2015-04-15 NOTE — Assessment & Plan Note (Signed)
Mood is stable, continue Mirtazapine.  

## 2015-06-01 ENCOUNTER — Non-Acute Institutional Stay (SKILLED_NURSING_FACILITY): Payer: Medicare Other | Admitting: Nurse Practitioner

## 2015-06-01 ENCOUNTER — Encounter: Payer: Self-pay | Admitting: Nurse Practitioner

## 2015-06-01 DIAGNOSIS — R627 Adult failure to thrive: Secondary | ICD-10-CM | POA: Diagnosis not present

## 2015-06-01 DIAGNOSIS — K5900 Constipation, unspecified: Secondary | ICD-10-CM

## 2015-06-01 DIAGNOSIS — E039 Hypothyroidism, unspecified: Secondary | ICD-10-CM

## 2015-06-01 DIAGNOSIS — F0391 Unspecified dementia with behavioral disturbance: Secondary | ICD-10-CM

## 2015-06-01 DIAGNOSIS — I482 Chronic atrial fibrillation: Secondary | ICD-10-CM | POA: Diagnosis not present

## 2015-06-01 DIAGNOSIS — I4821 Permanent atrial fibrillation: Secondary | ICD-10-CM

## 2015-06-01 DIAGNOSIS — E113419 Type 2 diabetes mellitus with severe nonproliferative diabetic retinopathy with macular edema, unspecified eye: Secondary | ICD-10-CM

## 2015-06-01 DIAGNOSIS — H547 Unspecified visual loss: Secondary | ICD-10-CM

## 2015-06-01 DIAGNOSIS — F329 Major depressive disorder, single episode, unspecified: Secondary | ICD-10-CM

## 2015-06-01 DIAGNOSIS — H540 Blindness, both eyes: Secondary | ICD-10-CM | POA: Diagnosis not present

## 2015-06-01 DIAGNOSIS — I10 Essential (primary) hypertension: Secondary | ICD-10-CM | POA: Diagnosis not present

## 2015-06-01 DIAGNOSIS — F32A Depression, unspecified: Secondary | ICD-10-CM

## 2015-06-01 NOTE — Progress Notes (Signed)
Patient ID: [name] DockerCooke, female   DOB: [birth year], 81 y.o.   MRN: [id number]  Location:  SNF [facility] Provider:  Chipper OmanManXie Tyqwan Pink NP  Code Status:  DNR Goals of care: Advanced Directive information    Chief Complaint  Patient presents with  . Medical Management of Chronic Issues     HPI: Patient is a 81 y.o. female [facility] for evaluation of chronic medical conditions. The patient was admitted to Hospice Service due to her FTT, but her weight has been stabilized in the past 3-4 months. Hx of hypothyroidism, last TSH wnl 12/2014 while on Levothyroxine 12..5mcg. Her blood sugar is controlled on diet, last Hgb A1c 5.8 12/2014. Blood pressure and heart rate are controlled on metoprolol. Her mood is stable on Mirtazapine, dementia requires SNF MCU for care needs, her urinary incontinent is chronic and managed with adult depends. She has BM daily. She is still able to feed self at meals.   Review of Systems:  Review of Systems  Constitutional: Negative for fever and chills.       Emaciated, weight has been stabilized past 3-4 months  HENT: Positive for hearing loss. Negative for congestion, ear discharge and ear pain.   Eyes: Negative for pain, discharge and redness.       Low vision Dry eyes  Respiratory: Negative for cough, shortness of breath, wheezing and stridor.   Cardiovascular: Negative for chest pain, palpitations and leg swelling.  Gastrointestinal: Negative for nausea, vomiting, abdominal pain and constipation.  Genitourinary: Positive for frequency. Negative for dysuria and urgency.  Musculoskeletal: Negative for myalgias, back pain and neck pain.  Skin: Negative for rash.  Neurological: Negative for dizziness, tremors, seizures, weakness and headaches.  Psychiatric/Behavioral: Positive for memory loss. Negative for suicidal ideas and hallucinations. The patient is nervous/anxious.     Past Medical History  Diagnosis Date  . HTN (hypertension)     . Other and unspecified hyperlipidemia   . Hemorrhoids   . AF (atrial fibrillation) (HCC)   . Rectal bleeding   . Sinus node dysfunction (HCC)   . Hip fracture (HCC)   . Bradycardia   . Aortic stenosis   . Mitral valve prolapse   . Hearing loss   . Disturbance of salivary secretion [id number]  . Unspecified hereditary and idiopathic peripheral neuropathy 10/28/202012  . Closed fracture of unspecified trochanteric section of femur 05/29/2011  . Anxiety state, unspecified 08/19/2010  . Diffuse cystic mastopathy 12/23/2009  . Flatulence, eructation, and gas pain 12/03/2009  . Closed fracture of lumbar vertebra without mention of spinal cord injury 1August 05, 202010  . Personal history of fall 01/29/2009  . Occlusion and stenosis of carotid artery without mention of cerebral infarction 12/27/2007  . Syncope and collapse 12/27/2007  . Other malaise and fatigue 12/06/2007  . Unspecified urinary incontinence 02/22/2005  . Cardiac pacemaker in situ 09/12/2004  . Unspecified hypothyroidism 03/18/2010  . Abnormality of gait 09/08/2003  . Edema 08/18/2003  . Macular degeneration (senile) of retina, unspecified 12/12/2001  . Senile osteoporosis 12/13/2000  . Legal blindness, as defined in [place] 03/20/1999    secondary to macular degeneration  . Fracture of greater trochanter of left femur (HCC) 03/08/14  . Atrial fibrillation (HCC) 07/14/2009    Qualifier: Diagnosis of  By: Graciela HusbandsKlein, MD, Ruthann CancerFACC, Ty HiltsSteven Cochran   . Constipation 03/14/2014  . Dementia 2013    04/03/14 MMSE 22/30   . Depression 04/10/2014  . Fall 12/19/2012    07/22/14 fall  VS 144/76, 60, 20, 97.1. No apparent injury   . FTT (failure to thrive) in adult 12/15/2014  . Hypothyroidism 12/19/2007    Qualifier: Diagnosis of  By: Melvyn Neth CMA (AAMA), Patty  01/16/14 TSH 2.474 03/24/14 TSH 2.576    . Loss of weight 10/13/2014    Multiple factorials: dementia, depression, advanced age, possible AR of medications(metformin and Oxybutynin)-will increase Mirtazapine and continue  supplement. Observe.    . Macular degeneration of both eyes 01/24/2013    Severe visual impairment. Nearly blind.   Margie Ege  medtronic 09/07/2011  . Trochanteric fracture of left femur (HCC) 03/14/2014    CT scan did however reveal acute fracture in greater left trochanteric. Dr. Margarita Rana consulted. Recommended nonoperative management and weightbearing as tolerated and another 3 weeks of Lovenox for DVT prophylaxis.       . Type 2 diabetes mellitus with blindness, with macular edema, with severe nonproliferative retinopathy 12/19/2007    Qualifier: Diagnosis of  By: Melvyn Neth CMA (AAMA), Patty  01/09/14 Hgb A1c 6.5 01/16/14 Hgb A1c 6.5 03/24/14 Hgb 6.4 12/10/14 pharm: dc Metformin and CBG monitoring-failure to thrive and refusing to eat.     . Urinary tract infection, site not specified 04/10/2014  . Xerophthalmus 01/21/2015    Patient Active Problem List   Diagnosis Date Noted  . Xerophthalmus 01/21/2015  . FTT (failure to thrive) in adult 12/15/2014  . Loss of weight 10/13/2014  . Depression 04/10/2014  . DM type 2 with diabetic background retinopathy (HCC) 04/10/2014  . Constipation 03/14/2014  . Trochanteric fracture of left femur (HCC) 03/14/2014  . Fracture of greater trochanter of left femur (HCC) 03/10/2014  . Dementia 03/09/2014  . Macular degeneration of both eyes 01/24/2013  . Fall 12/19/2012  . DNR (do not resuscitate) 12/19/2012  . 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 Pacemaker-dual  medtronic 09/07/2011  . ATRIAL FIBRILLATION 07/14/2009  . RECTAL BLEEDING 12/21/2007  . Hypothyroidism 12/19/2007  . Type 2 diabetes mellitus with blindness, severe nonproliferative retinopathy, and macular edema 12/19/2007  . HYPERLIPIDEMIA 12/19/2007  . Essential hypertension 12/19/2007  . Legal blindness, as defined in [place] 03/20/1999    Allergies  Allergen Reactions  . Codeine     unknown  . Ibuprofen     unknown  . Lisinopril     unknown  . Penicillins     unknown  .  Sanctura [Trospium Chloride]     unknown    Medications: Patient's Medications  New Prescriptions   No medications on file  Previous Medications   ACETAMINOPHEN (TYLENOL) 325 MG TABLET    Take 650 mg by mouth every 4 (four) hours as needed for mild pain or moderate pain.   Modified Medications   No medications on file  Discontinued Medications   AMLODIPINE (NORVASC) 10 MG TABLET    Take 10 mg by mouth Daily.   BISACODYL (DULCOLAX) 10 MG SUPPOSITORY    Place 1 suppository (10 mg total) rectally daily as needed for moderate constipation.   HYDROCODONE-ACETAMINOPHEN (NORCO/VICODIN) 5-325 MG PER TABLET    Take 1-2 tablets by mouth every 6 (six) hours as needed for moderate pain.   LEVOTHYROXINE (SYNTHROID, LEVOTHROID) 25 MCG TABLET    Take 12.5 mg by mouth Daily.   MAGNESIUM HYDROXIDE (MILK OF MAGNESIA) 400 MG/5ML SUSPENSION    Take 30 mLs by mouth every other day. Every other day as needed for constipation   METOPROLOL SUCCINATE (TOPROL-XL) 50 MG 24 HR TABLET    Take 50 mg by mouth  Daily.   MIRTAZAPINE (REMERON) 7.5 MG TABLET    Take 7.5 mg by mouth at bedtime.   POLYETHYLENE GLYCOL POWDER (GLYCOLAX/MIRALAX) POWDER    Take 17 g by mouth daily.   SODIUM PHOSPHATE (FLEET) 7-19 GM/118ML ENEM    Place 133 mLs (1 enema total) rectally daily as needed for severe constipation.    Physical Exam: Filed Vitals:   06/01/15 1517  BP: 126/70  Pulse: 72  Temp: 97.6 F (36.4 C)  TempSrc: Tympanic  Resp: 16   There is no weight on file to calculate BMI.  Physical Exam  Constitutional: She is oriented to person, place, and time.  Extremely thin  HENT:  Head: Normocephalic and atraumatic.  Right Ear: External ear normal.  Left Ear: External ear normal.  Nose: Nose normal.  Mouth/Throat: Oropharynx is clear and moist. No oropharyngeal exudate.  Eyes: Conjunctivae and EOM are normal. Pupils are equal, round, and reactive to light. Right eye exhibits no discharge. Left eye exhibits no discharge.  No scleral icterus.  Dry eyes  Neck: Normal range of motion. Neck supple. No JVD present. No thyromegaly present.  Cardiovascular: Normal rate.   Murmur heard. Pacemaker. Systolic ejection murmur 3/6  Pulmonary/Chest: Effort normal and breath sounds normal. No respiratory distress. She has no wheezes. She has no rales. She exhibits no tenderness.  Abdominal: Soft. Bowel sounds are normal. She exhibits no distension. There is no tenderness. There is no rebound.  Musculoskeletal: Normal range of motion. She exhibits no edema or tenderness.  Lymphadenopathy:    She has no cervical adenopathy.  Neurological: She is alert and oriented to person, place, and time. She has normal reflexes. No cranial nerve deficit. She exhibits normal muscle tone. Coordination normal.  Skin: Skin is warm and dry. No rash noted. She is not diaphoretic. No erythema. No pallor.  Psychiatric: Thought content normal. Her mood appears not anxious. Her affect is not angry, not blunt, not labile and not inappropriate. Her speech is not rapid and/or pressured, not delayed and not slurred. She is slowed. She is not agitated, not aggressive, not hyperactive, not withdrawn, not actively hallucinating and not combative. Thought content is not paranoid and not delusional. Cognition and memory are impaired. She expresses impulsivity and inappropriate judgment. She exhibits a depressed mood. She exhibits abnormal recent memory.  Confusion, HOH, very low vision. Guards against examination. She is attentive.    Labs reviewed: Basic Metabolic Panel:  Recent Labs  13/24/40  NA 141  K 4.3  BUN 11  CREATININE 0.9    Liver Function Tests:  Recent Labs  01/27/15  AST 24  ALT 10  ALKPHOS 80    CBC:  Recent Labs  01/27/15  WBC 10.8  HGB 15.3  HCT 45  PLT 427*    Lab Results  Component Value Date   TSH 3.55 01/27/2015   Lab Results  Component Value Date   HGBA1C 5.8 01/27/2015   Lab Results  Component Value Date    CHOL 140 12/20/2012   HDL 38 12/20/2012   LDLCALC 57 12/20/2012   TRIG 227* 12/20/2012   CHOLHDL 5.0 12/22/2007    Significant Diagnostic Results since last visit: none  Patient Care Team: Kimber Relic, MD as PCP - General (Internal Medicine) Zyanna Leisinger X, NP as Nurse Practitioner (Nurse Practitioner) [facility] Harle Stanford., MD as Consulting Physician (Urology) Rachael Fee, MD as Consulting Physician (Gastroenterology) Salvatore Marvel, MD as Consulting Physician (Orthopedic Surgery) Venancio Poisson, MD  as Consulting Physician (Dermatology) Sheral Apley, MD as Attending Physician (Orthopedic Surgery)  Assessment/Plan Problem List Items Addressed This Visit    ATRIAL FIBRILLATION    Heart rate is in control, off Metoprolol.       Constipation    Stable, continue Senna S II nightly.       Dementia (Chronic)    /3/15 MMSE 22/30.  Comfort measures only, stabilized       Depression (Chronic)    Mood is stable, off Mirtazapine.       Essential hypertension (Chronic)    Controlled, off Metoprolol  daily and Amlodipine  daily.      FTT (failure to thrive) in adult - Primary (Chronic)    Persists, HOH, low vision, memory deficits, emotional liability, weight loss. Continue supportive care, continue prn Morphine, Atropine sol, Lorazepam for comfort measures.       Hypothyroidism (Chronic)    Last TSH wnl 3.554 12/2014, off Levothyroxine 12.35mcg      Type 2 diabetes mellitus with blindness, severe nonproliferative retinopathy, and macular edema (Chronic)    Diet controlled, last Hgb A1c 5.8 01/27/15          Family/ staff Communication: continue SNF MCU for care needs.   Labs/tests ordered: none  ManXie Kerianne Gurr NP Geriatrics [facility] [address], [place] [address] On Call:  [phone] & follow prompts after 5pm & weekends Office Phone:  [phone] Office Fax:   [phone]

## 2015-06-02 NOTE — Assessment & Plan Note (Signed)
Stable, continue Senna S II nightly.

## 2015-06-02 NOTE — Assessment & Plan Note (Signed)
Heart rate is in control, off Metoprolol.  

## 2015-06-02 NOTE — Assessment & Plan Note (Signed)
Controlled, off Metoprolol 50mg  daily and Amlodipine 10mg  daily.

## 2015-06-02 NOTE — Assessment & Plan Note (Signed)
Diet controlled, last Hgb A1c 5.8 01/27/15

## 2015-06-02 NOTE — Assessment & Plan Note (Signed)
/  3/15 MMSE 22/30.  Comfort measures only, stabilized

## 2015-06-02 NOTE — Assessment & Plan Note (Signed)
Persists, HOH, low vision, memory deficits, emotional liability, weight loss. Continue supportive care, continue prn Morphine, Atropine sol, Lorazepam for comfort measures.

## 2015-06-02 NOTE — Assessment & Plan Note (Signed)
Last TSH wnl 3.554 12/2014, off Levothyroxine 12.5mcg

## 2015-06-02 NOTE — Assessment & Plan Note (Signed)
Mood is stable, off Mirtazapine.

## 2015-06-17 ENCOUNTER — Telehealth: Payer: Self-pay | Admitting: *Deleted

## 2015-06-17 NOTE — Telephone Encounter (Signed)
[facility] [employer] #[phone] [facility].

## 2015-06-22 ENCOUNTER — Encounter: Payer: Self-pay | Admitting: Nurse Practitioner

## 2015-06-22 ENCOUNTER — Non-Acute Institutional Stay (SKILLED_NURSING_FACILITY): Payer: Medicare Other | Admitting: Nurse Practitioner

## 2015-06-22 DIAGNOSIS — K5900 Constipation, unspecified: Secondary | ICD-10-CM | POA: Diagnosis not present

## 2015-06-22 DIAGNOSIS — F329 Major depressive disorder, single episode, unspecified: Secondary | ICD-10-CM

## 2015-06-22 DIAGNOSIS — F32A Depression, unspecified: Secondary | ICD-10-CM

## 2015-06-22 DIAGNOSIS — I482 Chronic atrial fibrillation: Secondary | ICD-10-CM

## 2015-06-22 DIAGNOSIS — F0390 Unspecified dementia without behavioral disturbance: Secondary | ICD-10-CM | POA: Diagnosis not present

## 2015-06-22 DIAGNOSIS — I4821 Permanent atrial fibrillation: Secondary | ICD-10-CM

## 2015-06-22 DIAGNOSIS — H547 Unspecified visual loss: Secondary | ICD-10-CM

## 2015-06-22 DIAGNOSIS — H540 Blindness, both eyes: Secondary | ICD-10-CM

## 2015-06-22 DIAGNOSIS — I10 Essential (primary) hypertension: Secondary | ICD-10-CM | POA: Diagnosis not present

## 2015-06-22 DIAGNOSIS — E113419 Type 2 diabetes mellitus with severe nonproliferative diabetic retinopathy with macular edema, unspecified eye: Secondary | ICD-10-CM

## 2015-06-22 DIAGNOSIS — E039 Hypothyroidism, unspecified: Secondary | ICD-10-CM

## 2015-06-22 NOTE — Progress Notes (Signed)
Patient ID: [name], female   DOB: [birth year], 81 y.o.   MRN: [id number]  Location:  SNF [facility] Provider:  Chipper OmanManXie Hether Anselmo NP  Code Status:  DNR Goals of care: Advanced Directive information    Chief Complaint  Patient presents with  . Medical Management of Chronic Issues  . Acute Visit    fall 06/19/15     HPI: Patient is a 81 y.o. female [facility] for evaluation of fall 06/19/15, sustained multiple skin tears, above left eye, back of neck, left elbow, no s/s of infection, neurological checks wnl.  The patient was admitted to Hospice Service due to her FTT, but her weight has been stabilized in the past 3-4 months. Hx of hypothyroidism, last TSH wnl 12/2014 while on Levothyroxine 12..5mcg. Her blood sugar is controlled on diet, last Hgb A1c 5.8 12/2014. Blood pressure and heart rate are controlled on metoprolol. Her mood is stable on Mirtazapine, dementia requires SNF MCU for care needs, her urinary incontinent is chronic and managed with adult depends. She has BM daily. She is still able to feed self at meals.   Review of Systems:  Review of Systems  Constitutional: Negative for fever and chills.       Emaciated, weight has been stabilized past 3-4 months  HENT: Positive for hearing loss. Negative for congestion, ear discharge and ear pain.   Eyes: Negative for pain, discharge and redness.       Low vision Dry eyes  Respiratory: Negative for cough, shortness of breath, wheezing and stridor.   Cardiovascular: Negative for chest pain, palpitations and leg swelling.  Gastrointestinal: Negative for nausea, vomiting, abdominal pain and constipation.  Genitourinary: Positive for frequency. Negative for dysuria and urgency.  Musculoskeletal: Negative for myalgias, back pain and neck pain.  Skin: Negative for rash.       Multiple skin tears sustained from fall 06/19/15  Neurological: Negative for dizziness, tremors, seizures, weakness and headaches.    Psychiatric/Behavioral: Positive for memory loss. Negative for suicidal ideas and hallucinations. The patient is nervous/anxious.     Past Medical History  Diagnosis Date  . HTN (hypertension)   . Other and unspecified hyperlipidemia   . Hemorrhoids   . AF (atrial fibrillation) (HCC)   . Rectal bleeding   . Sinus node dysfunction (HCC)   . Hip fracture (HCC)   . Bradycardia   . Aortic stenosis   . Mitral valve prolapse   . Hearing loss   . Disturbance of salivary secretion [id number]  . Unspecified hereditary and idiopathic peripheral neuropathy 10/28/202012  . Closed fracture of unspecified trochanteric section of femur 05/29/2011  . Anxiety state, unspecified 08/19/2010  . Diffuse cystic mastopathy 12/23/2009  . Flatulence, eructation, and gas pain 12/03/2009  . Closed fracture of lumbar vertebra without mention of spinal cord injury 1February 25, 202010  . Personal history of fall 01/29/2009  . Occlusion and stenosis of carotid artery without mention of cerebral infarction 12/27/2007  . Syncope and collapse 12/27/2007  . Other malaise and fatigue 12/06/2007  . Unspecified urinary incontinence 02/22/2005  . Cardiac pacemaker in situ 09/12/2004  . Unspecified hypothyroidism 03/18/2010  . Abnormality of gait 09/08/2003  . Edema 08/18/2003  . Macular degeneration (senile) of retina, unspecified 12/12/2001  . Senile osteoporosis 12/13/2000  . [place] blindness, [place] defined in [place] 03/20/1999    secondary to macular degeneration  . Fracture of greater trochanter of left femur (HCC) 03/08/14  . Atrial fibrillation (HCC) 07/14/2009  Qualifier: Diagnosis of  By: [name], MD, Susie Cassette   . Constipation 03/14/2014  . Dementia 2013    04/03/14 MMSE 22/30   . Depression 04/10/2014  . Fall 12/19/2012    07/22/14 fall VS 144/76, 60, 20, 97.1. No apparent injury   . FTT (failure to thrive) in adult 12/15/2014  . Hypothyroidism 12/19/2007    Qualifier: Diagnosis of  By: Melvyn Neth CMA (AAMA), Patty  01/16/14 TSH 2.474  03/24/14 TSH 2.576    . Loss of weight 10/13/2014    Multiple factorials: dementia, depression, advanced age, possible AR of medications(metformin and Oxybutynin)-will increase Mirtazapine and continue supplement. Observe.    . Macular degeneration of both eyes 01/24/2013    Severe visual impairment. Nearly blind.   Margie Ege  medtronic 09/07/2011  . Trochanteric fracture of left femur (HCC) 03/14/2014    CT scan did however reveal acute fracture in greater left trochanteric. Dr. Margarita Rana consulted. Recommended nonoperative management and weightbearing as tolerated and another 3 weeks of Lovenox for DVT prophylaxis.       . Type 2 diabetes mellitus with blindness, with macular edema, with severe nonproliferative retinopathy 12/19/2007    Qualifier: Diagnosis of  By: Melvyn Neth CMA (AAMA), Patty  01/09/14 Hgb A1c 6.5 01/16/14 Hgb A1c 6.5 03/24/14 Hgb 6.4 12/10/14 pharm: dc Metformin and CBG monitoring-failure to thrive and refusing to eat.     . Urinary tract infection, site not specified 04/10/2014  . Xerophthalmus 01/21/2015    Patient Active Problem List   Diagnosis Date Noted  . Xerophthalmus 01/21/2015  . FTT (failure to thrive) in adult 12/15/2014  . Loss of weight 10/13/2014  . Depression 04/10/2014  . DM type 2 with diabetic background retinopathy (HCC) 04/10/2014  . Constipation 03/14/2014  . Trochanteric fracture of left femur (HCC) 03/14/2014  . Fracture of greater trochanter of left femur (HCC) 03/10/2014  . Dementia 03/09/2014  . Macular degeneration of both eyes 01/24/2013  . Fall 12/19/2012  . DNR (do not resuscitate) 12/19/2012  . 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 Pacemaker-dual  medtronic 09/07/2011  . ATRIAL FIBRILLATION 07/14/2009  . RECTAL BLEEDING 12/21/2007  . Hypothyroidism 12/19/2007  . Type 2 diabetes mellitus with blindness, severe nonproliferative retinopathy, and macular edema 12/19/2007  . HYPERLIPIDEMIA 12/19/2007  . Essential hypertension 12/19/2007    . Legal blindness, as defined in [place] 03/20/1999    Allergies  Allergen Reactions  . Codeine     unknown  . Ibuprofen     unknown  . Lisinopril     unknown  . Penicillins     unknown  . Sanctura [Trospium Chloride]     unknown    Medications: Patient's Medications  New Prescriptions   No medications on file  Previous Medications   ACETAMINOPHEN (TYLENOL) 325 MG TABLET    Take 650 mg by mouth every 4 (four) hours as needed for mild pain or moderate pain.   Modified Medications   No medications on file  Discontinued Medications   No medications on file    Physical Exam: Filed Vitals:   06/22/15 1148  BP: 122/70  Pulse: 80  Temp: 98.1 F (36.7 C)  TempSrc: Tympanic  Resp: 20   There is no weight on file to calculate BMI.  Physical Exam  Constitutional: She is oriented to person, place, and time.  Extremely thin  HENT:  Head: Normocephalic and atraumatic.  Right Ear: External ear normal.  Left Ear: External ear normal.  Nose: Nose normal.  Mouth/Throat:  Oropharynx is clear and moist. No oropharyngeal exudate.  Eyes: Conjunctivae and EOM are normal. Pupils are equal, round, and reactive to light. Right eye exhibits no discharge. Left eye exhibits no discharge. No scleral icterus.  Dry eyes  Neck: Normal range of motion. Neck supple. No JVD present. No thyromegaly present.  Cardiovascular: Normal rate.   Murmur heard. Pacemaker. Systolic ejection murmur 3/6  Pulmonary/Chest: Effort normal and breath sounds normal. No respiratory distress. She has no wheezes. She has no rales. She exhibits no tenderness.  Abdominal: Soft. Bowel sounds are normal. She exhibits no distension. There is no tenderness. There is no rebound.  Musculoskeletal: Normal range of motion. She exhibits no edema or tenderness.  Lymphadenopathy:    She has no cervical adenopathy.  Neurological: She is alert and oriented to person, place, and time. She has normal reflexes. No cranial nerve  deficit. She exhibits normal muscle tone. Coordination normal.  Skin: Skin is warm and dry. No rash noted. She is not diaphoretic. No erythema. No pallor.  Multiple skin tears above the left eye, back of neck, and left elbow. No s/s of infection.   Psychiatric: Thought content normal. Her mood appears not anxious. Her affect is not angry, not blunt, not labile and not inappropriate. Her speech is not rapid and/or pressured, not delayed and not slurred. She is slowed. She is not agitated, not aggressive, not hyperactive, not withdrawn, not actively hallucinating and not combative. Thought content is not paranoid and not delusional. Cognition and memory are impaired. She expresses impulsivity and inappropriate judgment. She exhibits a depressed mood. She exhibits abnormal recent memory.  Confusion, HOH, very low vision. Guards against examination. She is attentive.    Labs reviewed: Basic Metabolic Panel:  Recent Labs  16/10/96  NA 141  K 4.3  BUN 11  CREATININE 0.9    Liver Function Tests:  Recent Labs  01/27/15  AST 24  ALT 10  ALKPHOS 80    CBC:  Recent Labs  01/27/15  WBC 10.8  HGB 15.3  HCT 45  PLT 427*    Lab Results  Component Value Date   TSH 3.55 01/27/2015   Lab Results  Component Value Date   HGBA1C 5.8 01/27/2015   Lab Results  Component Value Date   CHOL 140 12/20/2012   HDL 38 12/20/2012   LDLCALC 57 12/20/2012   TRIG 227* 12/20/2012   CHOLHDL 5.0 12/22/2007    Significant Diagnostic Results since last visit: none  Patient Care Team: Kimber Relic, MD as PCP - General (Internal Medicine) Jozlyn Schatz X, NP as Nurse Practitioner (Nurse Practitioner) [facility] Harle Stanford., MD as Consulting Physician (Urology) Rachael Fee, MD as Consulting Physician (Gastroenterology) Salvatore Marvel, MD as Consulting Physician (Orthopedic Surgery) Venancio Poisson, MD as Consulting Physician (Dermatology) Sheral Apley, MD as Attending  Physician (Orthopedic Surgery)  Assessment/Plan Problem List Items Addressed This Visit    Hypothyroidism (Chronic)    Last TSH wnl 3.554 12/2014, off Levothyroxine 12.23mcg      Type 2 diabetes mellitus with blindness, severe nonproliferative retinopathy, and macular edema (Chronic)    Diet controlled, last Hgb A1c 5.8 01/27/15      Essential hypertension (Chronic)    Controlled, off Metoprolol 50mg  daily and Amlodipine 10mg  daily.      Dementia - Primary (Chronic)    04/03/14 MMSE 22/30.  Comfort measures only, stabilized       Depression (Chronic)    Mood is stable, off  Mirtazapine.       ATRIAL FIBRILLATION    Heart rate is in control, off Metoprolol.       Constipation    Stable, continue Senna S II nightly.           Family/ staff Communication: continue SNF MCU for care needs.   Labs/tests ordered: none  ManXie Mashelle Busick NP Geriatrics [facility] [address], [place] [address] On Call:  [phone] & follow prompts after 5pm & weekends Office Phone:  [phone] Office Fax:  [phone]

## 2015-06-22 NOTE — Assessment & Plan Note (Signed)
Heart rate is in control, off Metoprolol.  

## 2015-06-22 NOTE — Assessment & Plan Note (Signed)
Last TSH wnl 3.554 12/2014, off Levothyroxine 12.5mcg 

## 2015-06-22 NOTE — Assessment & Plan Note (Signed)
Controlled, off Metoprolol 50mg daily and Amlodipine 10mg daily. 

## 2015-06-22 NOTE — Assessment & Plan Note (Signed)
Stable, continue Senna S II nightly.  

## 2015-06-22 NOTE — Assessment & Plan Note (Signed)
04/03/14 MMSE 22/30.  Comfort measures only, stabilized

## 2015-06-22 NOTE — Assessment & Plan Note (Signed)
Diet controlled, last Hgb A1c 5.8 01/27/15 

## 2015-06-22 NOTE — Assessment & Plan Note (Signed)
Mood is stable, off Mirtazapine.  

## 2015-07-07 ENCOUNTER — Telehealth: Payer: Self-pay | Admitting: *Deleted

## 2015-07-07 NOTE — Telephone Encounter (Signed)
Received fax from [employer][phone](Lana Lamonte Sakaierera) for [facility] Policy Attending Physician's Statement. Given to Dr. Chilton SiGreen to review and sign. To be faxed back to Fax#: [phone] Policy Number : [id number]

## 2015-07-29 ENCOUNTER — Non-Acute Institutional Stay (SKILLED_NURSING_FACILITY): Payer: Medicare Other | Admitting: Nurse Practitioner

## 2015-07-29 ENCOUNTER — Encounter: Payer: Self-pay | Admitting: Nurse Practitioner

## 2015-07-29 DIAGNOSIS — F329 Major depressive disorder, single episode, unspecified: Secondary | ICD-10-CM

## 2015-07-29 DIAGNOSIS — H540 Blindness, both eyes: Secondary | ICD-10-CM | POA: Diagnosis not present

## 2015-07-29 DIAGNOSIS — K5900 Constipation, unspecified: Secondary | ICD-10-CM

## 2015-07-29 DIAGNOSIS — E113419 Type 2 diabetes mellitus with severe nonproliferative diabetic retinopathy with macular edema, unspecified eye: Secondary | ICD-10-CM | POA: Diagnosis not present

## 2015-07-29 DIAGNOSIS — R627 Adult failure to thrive: Secondary | ICD-10-CM | POA: Diagnosis not present

## 2015-07-29 DIAGNOSIS — H547 Unspecified visual loss: Principal | ICD-10-CM

## 2015-07-29 DIAGNOSIS — F0390 Unspecified dementia without behavioral disturbance: Secondary | ICD-10-CM | POA: Diagnosis not present

## 2015-07-29 DIAGNOSIS — E039 Hypothyroidism, unspecified: Secondary | ICD-10-CM | POA: Diagnosis not present

## 2015-07-29 DIAGNOSIS — I10 Essential (primary) hypertension: Secondary | ICD-10-CM | POA: Diagnosis not present

## 2015-07-29 DIAGNOSIS — I482 Chronic atrial fibrillation: Secondary | ICD-10-CM

## 2015-07-29 DIAGNOSIS — F32A Depression, unspecified: Secondary | ICD-10-CM

## 2015-07-29 DIAGNOSIS — I4821 Permanent atrial fibrillation: Secondary | ICD-10-CM

## 2015-07-29 NOTE — Assessment & Plan Note (Signed)
Heart rate is in control, off Metoprolol.  

## 2015-07-29 NOTE — Assessment & Plan Note (Signed)
Mood is stable, off Mirtazapine.  

## 2015-07-29 NOTE — Assessment & Plan Note (Signed)
Persists, HOH, low vision, memory deficits, emotional liability, weight loss. Continue supportive care, continue prn Morphine, Atropine sol, Lorazepam for comfort measures.  

## 2015-07-29 NOTE — Assessment & Plan Note (Signed)
Controlled, off Metoprolol 50mg daily and Amlodipine 10mg daily. 

## 2015-07-29 NOTE — Progress Notes (Signed)
Patient ID: [name] DockerCooke, female   DOB: Apr 15, 1915, 81 y.o.   MRN: [id number]  Location:  SNF [facility] Provider:  Chipper OmanManXie Shina Wass NP  Code Status:  DNR Goals of care: Advanced Directive information    Chief Complaint  Patient presents with  . Medical Management of Chronic Issues     HPI: Patient is a 81 y.o. female [facility] for evaluation of he patient was admitted to [facility] due to her FTT. Hx of hypothyroidism, last TSH wnl 12/2014 while on Levothyroxine 12..5mcg. Her blood sugar is controlled on diet, last Hgb A1c 5.8 12/2014. Blood pressure and heart rate are controlled on metoprolol. Her mood is stable on Mirtazapine, dementia requires SNF MCU for care needs, her urinary incontinent is chronic and managed with adult depends. She has BM daily. She is still able to feed self at meals.   Review of Systems  Constitutional: Negative for fever and chills.       Emaciated, weight has been stabilized past 3-4 months  HENT: Positive for hearing loss. Negative for congestion, ear discharge and ear pain.   Eyes: Negative for pain, discharge and redness.       Low vision Dry eyes  Respiratory: Negative for cough, shortness of breath, wheezing and stridor.   Cardiovascular: Negative for chest pain, palpitations and leg swelling.  Gastrointestinal: Negative for nausea, vomiting, abdominal pain and constipation.  Genitourinary: Positive for frequency. Negative for dysuria and urgency.  Musculoskeletal: Negative for myalgias, back pain and neck pain.  Skin: Negative for rash.       Multiple skin tears sustained from fall 06/19/15  Neurological: Negative for dizziness, tremors, seizures, weakness and headaches.  Psychiatric/Behavioral: Positive for memory loss. Negative for suicidal ideas and hallucinations. The patient is nervous/anxious.     Past Medical History  Diagnosis Date  . HTN (hypertension)   . Other and unspecified hyperlipidemia   .  Hemorrhoids   . AF (atrial fibrillation) (HCC)   . Rectal bleeding   . Sinus node dysfunction (HCC)   . Hip fracture (HCC)   . Bradycardia   . Aortic stenosis   . Mitral valve prolapse   . Hearing loss   . Disturbance of salivary secretion [id number]  . Unspecified hereditary and idiopathic peripheral neuropathy 10/28/202012  . Closed fracture of unspecified trochanteric section of femur 05/29/2011  . Anxiety state, unspecified 08/19/2010  . Diffuse cystic mastopathy 12/23/2009  . Flatulence, eructation, and gas pain 12/03/2009  . Closed fracture of lumbar vertebra without mention of spinal cord injury 105-19-2010  . Personal history of fall 01/29/2009  . Occlusion and stenosis of carotid artery without mention of cerebral infarction 12/27/2007  . Syncope and collapse 12/27/2007  . Other malaise and fatigue 12/06/2007  . Unspecified urinary incontinence 02/22/2005  . Cardiac pacemaker in situ 09/12/2004  . Unspecified hypothyroidism 03/18/2010  . Abnormality of gait 09/08/2003  . Edema 08/18/2003  . Macular degeneration (senile) of retina, unspecified 12/12/2001  . Senile osteoporosis 12/13/2000  . [place] blindness, [place] defined in [place] 03/20/1999    secondary to macular degeneration  . Fracture of greater trochanter of left femur (HCC) 03/08/14  . Atrial fibrillation (HCC) 07/14/2009    Qualifier: Diagnosis of  By: Graciela HusbandsKlein, MD, Ruthann CancerFACC, Ty HiltsSteven Cochran   . Constipation 03/14/2014  . Dementia 2013    04/03/14 MMSE 22/30   . Depression 04/10/2014  . Fall 12/19/2012    07/22/14 fall VS 144/76, 60, 20, 97.1. No  apparent injury   . FTT (failure to thrive) in adult 12/15/2014  . Hypothyroidism 12/19/2007    Qualifier: Diagnosis of  By: Melvyn Neth CMA (AAMA), Patty  01/16/14 TSH 2.474 03/24/14 TSH 2.576    . Loss of weight 10/13/2014    Multiple factorials: dementia, depression, advanced age, possible AR of medications(metformin and Oxybutynin)-will increase Mirtazapine and continue supplement. Observe.    . Macular  degeneration of both eyes 01/24/2013    Severe visual impairment. Nearly blind.   Margie Ege  medtronic 09/07/2011  . Trochanteric fracture of left femur (HCC) 03/14/2014    CT scan did however reveal acute fracture in greater left trochanteric. Dr. Margarita Rana consulted. Recommended nonoperative management and weightbearing as tolerated and another 3 weeks of Lovenox for DVT prophylaxis.       . Type 2 diabetes mellitus with blindness, with macular edema, with severe nonproliferative retinopathy 12/19/2007    Qualifier: Diagnosis of  By: Melvyn Neth CMA (AAMA), Patty  01/09/14 Hgb A1c 6.5 01/16/14 Hgb A1c 6.5 03/24/14 Hgb 6.4 12/10/14 pharm: dc Metformin and CBG monitoring-failure to thrive and refusing to eat.     . Urinary tract infection, site not specified 04/10/2014  . Xerophthalmus 01/21/2015    Patient Active Problem List   Diagnosis Date Noted  . Xerophthalmus 01/21/2015  . FTT (failure to thrive) in adult 12/15/2014  . Loss of weight 10/13/2014  . Depression 04/10/2014  . DM type 2 with diabetic background retinopathy (HCC) 04/10/2014  . Constipation 03/14/2014  . Trochanteric fracture of left femur (HCC) 03/14/2014  . Fracture of greater trochanter of left femur (HCC) 03/10/2014  . Dementia 03/09/2014  . Macular degeneration of both eyes 01/24/2013  . Fall 12/19/2012  . DNR (do not resuscitate) 12/19/2012  . 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 Pacemaker-dual  medtronic 09/07/2011  . ATRIAL FIBRILLATION 07/14/2009  . RECTAL BLEEDING 12/21/2007  . Hypothyroidism 12/19/2007  . Type 2 diabetes mellitus with blindness, severe nonproliferative retinopathy, and macular edema 12/19/2007  . HYPERLIPIDEMIA 12/19/2007  . Essential hypertension 12/19/2007  . Legal blindness, as defined in [place] 03/20/1999    Allergies  Allergen Reactions  . Codeine     unknown  . Ibuprofen     unknown  . Lisinopril     unknown  . Penicillins     unknown  . Sanctura [Trospium Chloride]      unknown    Medications: Patient's Medications  New Prescriptions   No medications on file  Previous Medications   ACETAMINOPHEN (TYLENOL) 325 MG TABLET    Take 650 mg by mouth every 4 (four) hours as needed for mild pain or moderate pain.   Modified Medications   No medications on file  Discontinued Medications   No medications on file    Physical Exam: Filed Vitals:   07/29/15 1340  BP: 108/64  Pulse: 78  Temp: 98.9 F (37.2 C)  TempSrc: Tympanic  Resp: 18   There is no weight on file to calculate BMI.  Physical Exam  Constitutional: She is oriented to person, place, and time.  Extremely thin  HENT:  Head: Normocephalic and atraumatic.  Right Ear: External ear normal.  Left Ear: External ear normal.  Nose: Nose normal.  Mouth/Throat: Oropharynx is clear and moist. No oropharyngeal exudate.  Eyes: Conjunctivae and EOM are normal. Pupils are equal, round, and reactive to light. Right eye exhibits no discharge. Left eye exhibits no discharge. No scleral icterus.  Dry eyes  Neck: Normal range of motion. Neck  supple. No JVD present. No thyromegaly present.  Cardiovascular: Normal rate.   Murmur heard. Pacemaker. Systolic ejection murmur 3/6  Pulmonary/Chest: Effort normal and breath sounds normal. No respiratory distress. She has no wheezes. She has no rales. She exhibits no tenderness.  Abdominal: Soft. Bowel sounds are normal. She exhibits no distension. There is no tenderness. There is no rebound.  Musculoskeletal: Normal range of motion. She exhibits no edema or tenderness.  Lymphadenopathy:    She has no cervical adenopathy.  Neurological: She is alert and oriented to person, place, and time. She has normal reflexes. No cranial nerve deficit. She exhibits normal muscle tone. Coordination normal.  Skin: Skin is warm and dry. No rash noted. She is not diaphoretic. No erythema. No pallor.  Multiple skin tears above the left eye, back of neck, and left elbow. No s/s of  infection.   Psychiatric: Thought content normal. Her mood appears not anxious. Her affect is not angry, not blunt, not labile and not inappropriate. Her speech is not rapid and/or pressured, not delayed and not slurred. She is slowed. She is not agitated, not aggressive, not hyperactive, not withdrawn, not actively hallucinating and not combative. Thought content is not paranoid and not delusional. Cognition and memory are impaired. She expresses impulsivity and inappropriate judgment. She exhibits a depressed mood. She exhibits abnormal recent memory.  Confusion, HOH, very low vision. Guards against examination. She is attentive.    Labs reviewed: Basic Metabolic Panel:  Recent Labs  16/10/96  NA 141  K 4.3  BUN 11  CREATININE 0.9    Liver Function Tests:  Recent Labs  01/27/15  AST 24  ALT 10  ALKPHOS 80    CBC:  Recent Labs  01/27/15  WBC 10.8  HGB 15.3  HCT 45  PLT 427*    Lab Results  Component Value Date   TSH 3.55 01/27/2015   Lab Results  Component Value Date   HGBA1C 5.8 01/27/2015   Lab Results  Component Value Date   CHOL 140 12/20/2012   HDL 38 12/20/2012   LDLCALC 57 12/20/2012   TRIG 227* 12/20/2012   CHOLHDL 5.0 12/22/2007    Significant Diagnostic Results since last visit: none  Patient Care Team: Kimber Relic, MD as PCP - General (Internal Medicine) Ladarion Munyon X, NP as Nurse Practitioner (Nurse Practitioner) [facility] Harle Stanford., MD as Consulting Physician (Urology) Rachael Fee, MD as Consulting Physician (Gastroenterology) Salvatore Marvel, MD as Consulting Physician (Orthopedic Surgery) Venancio Poisson, MD as Consulting Physician (Dermatology) Sheral Apley, MD as Attending Physician (Orthopedic Surgery)  Assessment/Plan Problem List Items Addressed This Visit    Type 2 diabetes mellitus with blindness, severe nonproliferative retinopathy, and macular edema - Primary (Chronic)    Diet controlled, last Hgb  A1c 5.8 01/27/15      Hypothyroidism (Chronic)    Last TSH wnl 3.554 12/2014, off Levothyroxine 12.61mcg      FTT (failure to thrive) in adult (Chronic)    Persists, HOH, low vision, memory deficits, emotional liability, weight loss. Continue supportive care, continue prn Morphine, Atropine sol, Lorazepam for comfort measures.       Essential hypertension (Chronic)    Controlled, off Metoprolol  daily and Amlodipine  daily.      Depression (Chronic)    Mood is stable, off Mirtazapine.       Dementia (Chronic)    04/03/14 MMSE 22/30.  Comfort measures only, stabilized       Constipation  Stable, continue Senna S II nightly.       ATRIAL FIBRILLATION    Heart rate is in control, off Metoprolol.           Family/ staff Communication: continue SNF MCU for care needs.   Labs/tests ordered: none  ManXie Ahmaad Neidhardt NP Geriatrics [facility] [address], [place] [address] On Call:  [phone] & follow prompts after 5pm & weekends Office Phone:  [phone] Office Fax:  [phone]

## 2015-07-29 NOTE — Assessment & Plan Note (Signed)
Stable, continue Senna S II nightly.  

## 2015-07-29 NOTE — Assessment & Plan Note (Signed)
Diet controlled, last Hgb A1c 5.8 01/27/15 

## 2015-07-29 NOTE — Assessment & Plan Note (Signed)
Last TSH wnl 3.554 12/2014, off Levothyroxine 12.5mcg 

## 2015-07-29 NOTE — Assessment & Plan Note (Signed)
04/03/14 MMSE 22/30.  Comfort measures only, stabilized  

## 2015-08-02 DIAGNOSIS — I10 Essential (primary) hypertension: Secondary | ICD-10-CM | POA: Diagnosis not present

## 2015-08-02 DIAGNOSIS — E039 Hypothyroidism, unspecified: Secondary | ICD-10-CM | POA: Diagnosis not present

## 2015-08-02 DIAGNOSIS — F0151 Vascular dementia with behavioral disturbance: Secondary | ICD-10-CM | POA: Diagnosis not present

## 2015-08-02 DIAGNOSIS — F339 Major depressive disorder, recurrent, unspecified: Secondary | ICD-10-CM | POA: Diagnosis not present

## 2015-08-02 DIAGNOSIS — E785 Hyperlipidemia, unspecified: Secondary | ICD-10-CM | POA: Diagnosis not present

## 2015-08-02 DIAGNOSIS — I495 Sick sinus syndrome: Secondary | ICD-10-CM | POA: Diagnosis not present

## 2015-08-02 DIAGNOSIS — I4891 Unspecified atrial fibrillation: Secondary | ICD-10-CM | POA: Diagnosis not present

## 2015-08-02 DIAGNOSIS — R630 Anorexia: Secondary | ICD-10-CM | POA: Diagnosis not present

## 2015-08-02 DIAGNOSIS — E46 Unspecified protein-calorie malnutrition: Secondary | ICD-10-CM | POA: Diagnosis not present

## 2015-08-02 DIAGNOSIS — E1159 Type 2 diabetes mellitus with other circulatory complications: Secondary | ICD-10-CM | POA: Diagnosis not present

## 2015-08-02 DIAGNOSIS — H3530 Unspecified macular degeneration: Secondary | ICD-10-CM | POA: Diagnosis not present

## 2015-08-02 DIAGNOSIS — R634 Abnormal weight loss: Secondary | ICD-10-CM | POA: Diagnosis not present

## 2015-08-03 DIAGNOSIS — I4891 Unspecified atrial fibrillation: Secondary | ICD-10-CM | POA: Diagnosis not present

## 2015-08-03 DIAGNOSIS — I495 Sick sinus syndrome: Secondary | ICD-10-CM | POA: Diagnosis not present

## 2015-08-03 DIAGNOSIS — R630 Anorexia: Secondary | ICD-10-CM | POA: Diagnosis not present

## 2015-08-03 DIAGNOSIS — F0151 Vascular dementia with behavioral disturbance: Secondary | ICD-10-CM | POA: Diagnosis not present

## 2015-08-03 DIAGNOSIS — E46 Unspecified protein-calorie malnutrition: Secondary | ICD-10-CM | POA: Diagnosis not present

## 2015-08-03 DIAGNOSIS — R634 Abnormal weight loss: Secondary | ICD-10-CM | POA: Diagnosis not present

## 2015-08-06 DIAGNOSIS — R630 Anorexia: Secondary | ICD-10-CM | POA: Diagnosis not present

## 2015-08-06 DIAGNOSIS — R634 Abnormal weight loss: Secondary | ICD-10-CM | POA: Diagnosis not present

## 2015-08-06 DIAGNOSIS — E46 Unspecified protein-calorie malnutrition: Secondary | ICD-10-CM | POA: Diagnosis not present

## 2015-08-06 DIAGNOSIS — I4891 Unspecified atrial fibrillation: Secondary | ICD-10-CM | POA: Diagnosis not present

## 2015-08-06 DIAGNOSIS — I495 Sick sinus syndrome: Secondary | ICD-10-CM | POA: Diagnosis not present

## 2015-08-06 DIAGNOSIS — F0151 Vascular dementia with behavioral disturbance: Secondary | ICD-10-CM | POA: Diagnosis not present

## 2015-08-07 DIAGNOSIS — R634 Abnormal weight loss: Secondary | ICD-10-CM | POA: Diagnosis not present

## 2015-08-07 DIAGNOSIS — E46 Unspecified protein-calorie malnutrition: Secondary | ICD-10-CM | POA: Diagnosis not present

## 2015-08-07 DIAGNOSIS — F0151 Vascular dementia with behavioral disturbance: Secondary | ICD-10-CM | POA: Diagnosis not present

## 2015-08-07 DIAGNOSIS — I4891 Unspecified atrial fibrillation: Secondary | ICD-10-CM | POA: Diagnosis not present

## 2015-08-07 DIAGNOSIS — I495 Sick sinus syndrome: Secondary | ICD-10-CM | POA: Diagnosis not present

## 2015-08-07 DIAGNOSIS — R630 Anorexia: Secondary | ICD-10-CM | POA: Diagnosis not present

## 2015-08-10 DIAGNOSIS — I4891 Unspecified atrial fibrillation: Secondary | ICD-10-CM | POA: Diagnosis not present

## 2015-08-10 DIAGNOSIS — R634 Abnormal weight loss: Secondary | ICD-10-CM | POA: Diagnosis not present

## 2015-08-10 DIAGNOSIS — E46 Unspecified protein-calorie malnutrition: Secondary | ICD-10-CM | POA: Diagnosis not present

## 2015-08-10 DIAGNOSIS — F0151 Vascular dementia with behavioral disturbance: Secondary | ICD-10-CM | POA: Diagnosis not present

## 2015-08-10 DIAGNOSIS — R630 Anorexia: Secondary | ICD-10-CM | POA: Diagnosis not present

## 2015-08-10 DIAGNOSIS — I495 Sick sinus syndrome: Secondary | ICD-10-CM | POA: Diagnosis not present

## 2015-08-12 DIAGNOSIS — I4891 Unspecified atrial fibrillation: Secondary | ICD-10-CM | POA: Diagnosis not present

## 2015-08-12 DIAGNOSIS — I495 Sick sinus syndrome: Secondary | ICD-10-CM | POA: Diagnosis not present

## 2015-08-12 DIAGNOSIS — R634 Abnormal weight loss: Secondary | ICD-10-CM | POA: Diagnosis not present

## 2015-08-12 DIAGNOSIS — E46 Unspecified protein-calorie malnutrition: Secondary | ICD-10-CM | POA: Diagnosis not present

## 2015-08-12 DIAGNOSIS — F0151 Vascular dementia with behavioral disturbance: Secondary | ICD-10-CM | POA: Diagnosis not present

## 2015-08-12 DIAGNOSIS — R630 Anorexia: Secondary | ICD-10-CM | POA: Diagnosis not present

## 2015-08-17 DIAGNOSIS — I4891 Unspecified atrial fibrillation: Secondary | ICD-10-CM | POA: Diagnosis not present

## 2015-08-17 DIAGNOSIS — R630 Anorexia: Secondary | ICD-10-CM | POA: Diagnosis not present

## 2015-08-17 DIAGNOSIS — I495 Sick sinus syndrome: Secondary | ICD-10-CM | POA: Diagnosis not present

## 2015-08-17 DIAGNOSIS — R634 Abnormal weight loss: Secondary | ICD-10-CM | POA: Diagnosis not present

## 2015-08-17 DIAGNOSIS — F0151 Vascular dementia with behavioral disturbance: Secondary | ICD-10-CM | POA: Diagnosis not present

## 2015-08-17 DIAGNOSIS — E46 Unspecified protein-calorie malnutrition: Secondary | ICD-10-CM | POA: Diagnosis not present

## 2015-08-21 DIAGNOSIS — R634 Abnormal weight loss: Secondary | ICD-10-CM | POA: Diagnosis not present

## 2015-08-21 DIAGNOSIS — R630 Anorexia: Secondary | ICD-10-CM | POA: Diagnosis not present

## 2015-08-21 DIAGNOSIS — I4891 Unspecified atrial fibrillation: Secondary | ICD-10-CM | POA: Diagnosis not present

## 2015-08-21 DIAGNOSIS — E46 Unspecified protein-calorie malnutrition: Secondary | ICD-10-CM | POA: Diagnosis not present

## 2015-08-21 DIAGNOSIS — F0151 Vascular dementia with behavioral disturbance: Secondary | ICD-10-CM | POA: Diagnosis not present

## 2015-08-21 DIAGNOSIS — I495 Sick sinus syndrome: Secondary | ICD-10-CM | POA: Diagnosis not present

## 2015-08-25 DIAGNOSIS — I495 Sick sinus syndrome: Secondary | ICD-10-CM | POA: Diagnosis not present

## 2015-08-25 DIAGNOSIS — R634 Abnormal weight loss: Secondary | ICD-10-CM | POA: Diagnosis not present

## 2015-08-25 DIAGNOSIS — F0151 Vascular dementia with behavioral disturbance: Secondary | ICD-10-CM | POA: Diagnosis not present

## 2015-08-25 DIAGNOSIS — I4891 Unspecified atrial fibrillation: Secondary | ICD-10-CM | POA: Diagnosis not present

## 2015-08-25 DIAGNOSIS — E46 Unspecified protein-calorie malnutrition: Secondary | ICD-10-CM | POA: Diagnosis not present

## 2015-08-25 DIAGNOSIS — R630 Anorexia: Secondary | ICD-10-CM | POA: Diagnosis not present

## 2015-08-26 ENCOUNTER — Encounter: Payer: Self-pay | Admitting: Nurse Practitioner

## 2015-08-26 ENCOUNTER — Non-Acute Institutional Stay (SKILLED_NURSING_FACILITY): Payer: Medicare Other | Admitting: Nurse Practitioner

## 2015-08-26 DIAGNOSIS — R627 Adult failure to thrive: Secondary | ICD-10-CM | POA: Diagnosis not present

## 2015-08-26 DIAGNOSIS — K5900 Constipation, unspecified: Secondary | ICD-10-CM

## 2015-08-26 DIAGNOSIS — F0390 Unspecified dementia without behavioral disturbance: Secondary | ICD-10-CM

## 2015-08-26 DIAGNOSIS — I480 Paroxysmal atrial fibrillation: Secondary | ICD-10-CM

## 2015-08-26 DIAGNOSIS — F329 Major depressive disorder, single episode, unspecified: Secondary | ICD-10-CM | POA: Diagnosis not present

## 2015-08-26 DIAGNOSIS — I10 Essential (primary) hypertension: Secondary | ICD-10-CM

## 2015-08-26 DIAGNOSIS — F32A Depression, unspecified: Secondary | ICD-10-CM

## 2015-08-26 DIAGNOSIS — H540 Blindness, both eyes: Secondary | ICD-10-CM

## 2015-08-26 DIAGNOSIS — H547 Unspecified visual loss: Secondary | ICD-10-CM

## 2015-08-26 DIAGNOSIS — E113419 Type 2 diabetes mellitus with severe nonproliferative diabetic retinopathy with macular edema, unspecified eye: Secondary | ICD-10-CM

## 2015-08-26 DIAGNOSIS — E039 Hypothyroidism, unspecified: Secondary | ICD-10-CM | POA: Diagnosis not present

## 2015-08-26 NOTE — Progress Notes (Signed)
Patient ID: [name]. Gerst, female   DOB: April 28, 1915, 82 y.o.   MRN: [id number]

## 2015-08-27 DIAGNOSIS — I4891 Unspecified atrial fibrillation: Secondary | ICD-10-CM | POA: Diagnosis not present

## 2015-08-27 DIAGNOSIS — E46 Unspecified protein-calorie malnutrition: Secondary | ICD-10-CM | POA: Diagnosis not present

## 2015-08-27 DIAGNOSIS — R630 Anorexia: Secondary | ICD-10-CM | POA: Diagnosis not present

## 2015-08-27 DIAGNOSIS — F0151 Vascular dementia with behavioral disturbance: Secondary | ICD-10-CM | POA: Diagnosis not present

## 2015-08-27 DIAGNOSIS — I495 Sick sinus syndrome: Secondary | ICD-10-CM | POA: Diagnosis not present

## 2015-08-27 DIAGNOSIS — R634 Abnormal weight loss: Secondary | ICD-10-CM | POA: Diagnosis not present

## 2015-08-28 NOTE — Assessment & Plan Note (Signed)
Last TSH wnl 3.554 12/2014, off Levothyroxine 12.5mcg 

## 2015-08-28 NOTE — Assessment & Plan Note (Signed)
Controlled, off Metoprolol 50mg daily and Amlodipine 10mg daily. 

## 2015-08-28 NOTE — Assessment & Plan Note (Signed)
Stable, continue Senna S II nightly.  

## 2015-08-28 NOTE — Assessment & Plan Note (Signed)
04/03/14 MMSE 22/30.  Comfort measures only, stabilized  

## 2015-08-28 NOTE — Progress Notes (Signed)
Patient ID: [name]. Barbary, female   DOB: May 25, 1915, 82 y.o.   MRN: [id number]  Location:  SNF [facility] Provider:  Chipper Oman NP  Code Status:  DNR Goals of care: Advanced Directive information Does patient have an advance directive?: Yes, Type of Advance Directive: Out of facility DNR (pink MOST or yellow form)  Chief Complaint  Patient presents with  . Medical Management of Chronic Issues    Routine visit     HPI: Patient is a 82 y.o. female seen in the SNF at [facility] today for evaluation of FTT. Hx of hypothyroidism, last TSH wnl 12/2014 while on Levothyroxine 12.. . Her blood sugar is controlled on diet, last Hgb A1c 5.8 12/2014. Blood pressure and heart rate are controlled on metoprolol. Her mood is stable on Mirtazapine, dementia requires SNF MCU for care needs, her urinary incontinent is chronic and managed with adult depends. She has BM daily. She is still able to feed self at meals.   Review of Systems  Constitutional: Negative for fever and chills.       Emaciated, weight has been stabilized past 3-4 months  HENT: Positive for hearing loss. Negative for congestion, ear discharge and ear pain.   Eyes: Negative for pain, discharge and redness.       Low vision Dry eyes  Respiratory: Negative for cough, shortness of breath, wheezing and stridor.   Cardiovascular: Negative for chest pain, palpitations and leg swelling.  Gastrointestinal: Negative for nausea, vomiting, abdominal pain and constipation.  Genitourinary: Positive for frequency. Negative for dysuria and urgency.  Musculoskeletal: Negative for myalgias, back pain and neck pain.  Skin: Negative for rash.       Multiple skin tears sustained from fall 06/19/15  Neurological: Negative for dizziness, tremors, seizures, weakness and headaches.  Psychiatric/Behavioral: Positive for memory loss. Negative for suicidal ideas and hallucinations. The patient is nervous/anxious.     Past Medical History  Diagnosis  Date  . HTN (hypertension)   . Other and unspecified hyperlipidemia   . Hemorrhoids   . AF (atrial fibrillation) (HCC)   . Rectal bleeding   . Sinus node dysfunction (HCC)   . Hip fracture (HCC)   . Bradycardia   . Aortic stenosis   . Mitral valve prolapse   . Hearing loss   . Disturbance of salivary secretion [id number]  . Unspecified hereditary and idiopathic peripheral neuropathy 10/28/202012  . Closed fracture of unspecified trochanteric section of femur 05/29/2011  . Anxiety state, unspecified 08/19/2010  . Diffuse cystic mastopathy 12/23/2009  . Flatulence, eructation, and gas pain 12/03/2009  . Closed fracture of lumbar vertebra without mention of spinal cord injury [id number]  . Personal history of fall 01/29/2009  . Occlusion and stenosis of carotid artery without mention of cerebral infarction 12/27/2007  . Syncope and collapse 12/27/2007  . Other malaise and fatigue 12/06/2007  . Unspecified urinary incontinence 02/22/2005  . Cardiac pacemaker in situ 09/12/2004  . Unspecified hypothyroidism 03/18/2010  . Abnormality of gait 09/08/2003  . Edema 08/18/2003  . Macular degeneration (senile) of retina, unspecified 12/12/2001  . Senile osteoporosis 12/13/2000  . Legal blindness, as defined in [place] 03/20/1999    secondary to macular degeneration  . Fracture of greater trochanter of left femur (HCC) 03/08/14  . Atrial fibrillation (HCC) 07/14/2009    Qualifier: Diagnosis of  By: [name], MD, Ruthann Cancer Ty Hilts   . Constipation 03/14/2014  . Dementia 2013    04/03/14 MMSE 22/30   . Depression 04/10/2014  .  Fall 12/19/2012    07/22/14 fall VS 144/76, 60, 20, 97.1. No apparent injury   . FTT (failure to thrive) in adult 12/15/2014  . Hypothyroidism 12/19/2007    Qualifier: Diagnosis of  By: Melvyn Neth CMA (AAMA), Patty  01/16/14 TSH 2.474 03/24/14 TSH 2.576    . Loss of weight 10/13/2014    Multiple factorials: dementia, depression, advanced age, possible AR of medications(metformin and Oxybutynin)-will  increase Mirtazapine and continue supplement. Observe.    . Macular degeneration of both eyes 01/24/2013    Severe visual impairment. Nearly blind.   Margie Ege  medtronic 09/07/2011  . Trochanteric fracture of left femur (HCC) 03/14/2014    CT scan did however reveal acute fracture in greater left trochanteric. Dr. Margarita Rana consulted. Recommended nonoperative management and weightbearing as tolerated and another 3 weeks of Lovenox for DVT prophylaxis.       . Type 2 diabetes mellitus with blindness, with macular edema, with severe nonproliferative retinopathy 12/19/2007    Qualifier: Diagnosis of  By: Melvyn Neth CMA (AAMA), Patty  01/09/14 Hgb A1c 6.5 01/16/14 Hgb A1c 6.5 03/24/14 Hgb 6.4 12/10/14 pharm: dc Metformin and CBG monitoring-failure to thrive and refusing to eat.     . Urinary tract infection, site not specified 04/10/2014  . Xerophthalmus 01/21/2015    Patient Active Problem List   Diagnosis Date Noted  . Xerophthalmus 01/21/2015  . FTT (failure to thrive) in adult 12/15/2014  . Loss of weight 10/13/2014  . Depression 04/10/2014  . DM type 2 with diabetic background retinopathy (HCC) 04/10/2014  . Constipation 03/14/2014  . Trochanteric fracture of left femur (HCC) 03/14/2014  . Fracture of greater trochanter of left femur (HCC) 03/10/2014  . Dementia 03/09/2014  . Macular degeneration of both eyes 01/24/2013  . Fall 12/19/2012  . DNR (do not resuscitate) 12/19/2012  . 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 Pacemaker-dual  medtronic 09/07/2011  . ATRIAL FIBRILLATION 07/14/2009  . RECTAL BLEEDING 12/21/2007  . Hypothyroidism 12/19/2007  . Type 2 diabetes mellitus with blindness, severe nonproliferative retinopathy, and macular edema 12/19/2007  . HYPERLIPIDEMIA 12/19/2007  . Essential hypertension 12/19/2007  . Legal blindness, as defined in [place] 03/20/1999    Allergies  Allergen Reactions  . Codeine     unknown  . Ibuprofen     unknown  . Lisinopril     unknown  .  Penicillins     unknown  . Sanctura [Trospium Chloride]     unknown    Medications: Patient's Medications  New Prescriptions   No medications on file  Previous Medications   ACETAMINOPHEN (TYLENOL) 325 MG TABLET    Take 650 mg by mouth every 4 (four) hours as needed for mild pain or moderate pain.    ATROPINE 1 % OPHTHALMIC SOLUTION    Place 4 drops every 2 hours as needed for excess secretions   LORAZEPAM (ATIVAN) 0.5 MG TABLET    every 8 (eight) hours. Take 1 tablet by mouth every 2 hours as needed for restlessness or agitation   SENNOSIDES-DOCUSATE SODIUM (SENOKOT-S) 8.6-50 MG TABLET    Take two tablets by mouth at bedtime  Modified Medications   No medications on file  Discontinued Medications   No medications on file    Physical Exam: Filed Vitals:   08/26/15 1246  BP: 144/70  Pulse: 68  Temp: 98 F (36.7 C)  TempSrc: Oral  Resp: 18   There is no weight on file to calculate BMI.  Physical Exam  Constitutional: She is  oriented to person, place, and time.  Extremely thin  HENT:  Head: Normocephalic and atraumatic.  Right Ear: External ear normal.  Left Ear: External ear normal.  Nose: Nose normal.  Mouth/Throat: Oropharynx is clear and moist. No oropharyngeal exudate.  Eyes: Conjunctivae and EOM are normal. Pupils are equal, round, and reactive to light. Right eye exhibits no discharge. Left eye exhibits no discharge. No scleral icterus.  Dry eyes  Neck: Normal range of motion. Neck supple. No JVD present. No thyromegaly present.  Cardiovascular: Normal rate.   Murmur heard. Pacemaker. Systolic ejection murmur 3/6  Pulmonary/Chest: Effort normal and breath sounds normal. No respiratory distress. She has no wheezes. She has no rales. She exhibits no tenderness.  Abdominal: Soft. Bowel sounds are normal. She exhibits no distension. There is no tenderness. There is no rebound.  Musculoskeletal: Normal range of motion. She exhibits no edema or tenderness.    Lymphadenopathy:    She has no cervical adenopathy.  Neurological: She is alert and oriented to person, place, and time. She has normal reflexes. No cranial nerve deficit. She exhibits normal muscle tone. Coordination normal.  Skin: Skin is warm and dry. No rash noted. She is not diaphoretic. No erythema. No pallor.  Multiple skin tears above the left eye, back of neck, and left elbow. No s/s of infection.   Psychiatric: Thought content normal. Her mood appears not anxious. Her affect is not angry, not blunt, not labile and not inappropriate. Her speech is not rapid and/or pressured, not delayed and not slurred. She is slowed. She is not agitated, not aggressive, not hyperactive, not withdrawn, not actively hallucinating and not combative. Thought content is not paranoid and not delusional. Cognition and memory are impaired. She expresses impulsivity and inappropriate judgment. She exhibits a depressed mood. She exhibits abnormal recent memory.  Confusion, HOH, very low vision. Guards against examination. She is attentive.    Labs reviewed: Basic Metabolic Panel:  Recent Labs  18/84/16  NA 141  K 4.3  BUN 11  CREATININE 0.9    Liver Function Tests:  Recent Labs  01/27/15  AST 24  ALT 10  ALKPHOS 80    CBC:  Recent Labs  01/27/15  WBC 10.8  HGB 15.3  HCT 45  PLT 427*    Lab Results  Component Value Date   TSH 3.55 01/27/2015   Lab Results  Component Value Date   HGBA1C 5.8 01/27/2015   Lab Results  Component Value Date   CHOL 140 12/20/2012   HDL 38 12/20/2012   LDLCALC 57 12/20/2012   TRIG 227* 12/20/2012   CHOLHDL 5.0 12/22/2007    Significant Diagnostic Results since last visit: none  Patient Care Team: Kimber Relic, MD as PCP - General (Internal Medicine) Man Mast X, NP as Nurse Practitioner (Nurse Practitioner) [facility] Harle Stanford., MD as Consulting Physician (Urology) Rachael Fee, MD as Consulting Physician  (Gastroenterology) Salvatore Marvel, MD as Consulting Physician (Orthopedic Surgery) Venancio Poisson, MD as Consulting Physician (Dermatology) Sheral Apley, MD as Attending Physician (Orthopedic Surgery)  Assessment/Plan Problem List Items Addressed This Visit    Type 2 diabetes mellitus with blindness, severe nonproliferative retinopathy, and macular edema - Primary (Chronic)    Diet controlled, last Hgb A1c 5.8 01/27/15      Hypothyroidism (Chronic)    Last TSH wnl 3.554 12/2014, off Levothyroxine 12.43mcg      FTT (failure to thrive) in adult (Chronic)    Persists, HOH, low  vision, memory deficits, emotional liability, weight loss. Continue supportive care, continue prn Morphine, Atropine sol, Lorazepam for comfort measures.       Essential hypertension (Chronic)    Controlled, off Metoprolol  daily and Amlodipine  daily.      Depression (Chronic)    Mood is stable, off Mirtazapine.       Relevant Medications   LORazepam (ATIVAN) 0.5 MG tablet   Dementia (Chronic)    04/03/14 MMSE 22/30.  Comfort measures only, stabilized       Relevant Medications   LORazepam (ATIVAN) 0.5 MG tablet   Constipation    Stable, continue Senna S II nightly.       ATRIAL FIBRILLATION    Heart rate is in control, off Metoprolol.           Family/ staff Communication: continue SNF MCU for care needs.   Labs/tests ordered: none  ManXie Mast NP Geriatrics [facility] [address], [place] [address] On Call:  [phone] & follow prompts after 5pm & weekends Office Phone:  [phone] Office Fax:  [phone]

## 2015-08-28 NOTE — Assessment & Plan Note (Signed)
Persists, HOH, low vision, memory deficits, emotional liability, weight loss. Continue supportive care, continue prn Morphine, Atropine sol, Lorazepam for comfort measures.  

## 2015-08-28 NOTE — Assessment & Plan Note (Signed)
Diet controlled, last Hgb A1c 5.8 01/27/15 

## 2015-08-28 NOTE — Assessment & Plan Note (Signed)
Mood is stable, off Mirtazapine.  

## 2015-08-28 NOTE — Assessment & Plan Note (Signed)
Heart rate is in control, off Metoprolol.  

## 2015-09-01 DIAGNOSIS — E46 Unspecified protein-calorie malnutrition: Secondary | ICD-10-CM | POA: Diagnosis not present

## 2015-09-01 DIAGNOSIS — R630 Anorexia: Secondary | ICD-10-CM | POA: Diagnosis not present

## 2015-09-01 DIAGNOSIS — I495 Sick sinus syndrome: Secondary | ICD-10-CM | POA: Diagnosis not present

## 2015-09-01 DIAGNOSIS — I4891 Unspecified atrial fibrillation: Secondary | ICD-10-CM | POA: Diagnosis not present

## 2015-09-01 DIAGNOSIS — F0151 Vascular dementia with behavioral disturbance: Secondary | ICD-10-CM | POA: Diagnosis not present

## 2015-09-01 DIAGNOSIS — R634 Abnormal weight loss: Secondary | ICD-10-CM | POA: Diagnosis not present

## 2015-09-02 DIAGNOSIS — H3530 Unspecified macular degeneration: Secondary | ICD-10-CM | POA: Diagnosis not present

## 2015-09-02 DIAGNOSIS — E46 Unspecified protein-calorie malnutrition: Secondary | ICD-10-CM | POA: Diagnosis not present

## 2015-09-02 DIAGNOSIS — F0151 Vascular dementia with behavioral disturbance: Secondary | ICD-10-CM | POA: Diagnosis not present

## 2015-09-02 DIAGNOSIS — F339 Major depressive disorder, recurrent, unspecified: Secondary | ICD-10-CM | POA: Diagnosis not present

## 2015-09-02 DIAGNOSIS — I4891 Unspecified atrial fibrillation: Secondary | ICD-10-CM | POA: Diagnosis not present

## 2015-09-02 DIAGNOSIS — E785 Hyperlipidemia, unspecified: Secondary | ICD-10-CM | POA: Diagnosis not present

## 2015-09-02 DIAGNOSIS — I10 Essential (primary) hypertension: Secondary | ICD-10-CM | POA: Diagnosis not present

## 2015-09-02 DIAGNOSIS — E039 Hypothyroidism, unspecified: Secondary | ICD-10-CM | POA: Diagnosis not present

## 2015-09-02 DIAGNOSIS — E1159 Type 2 diabetes mellitus with other circulatory complications: Secondary | ICD-10-CM | POA: Diagnosis not present

## 2015-09-02 DIAGNOSIS — R634 Abnormal weight loss: Secondary | ICD-10-CM | POA: Diagnosis not present

## 2015-09-02 DIAGNOSIS — I495 Sick sinus syndrome: Secondary | ICD-10-CM | POA: Diagnosis not present

## 2015-09-02 DIAGNOSIS — R630 Anorexia: Secondary | ICD-10-CM | POA: Diagnosis not present

## 2015-09-03 DIAGNOSIS — E46 Unspecified protein-calorie malnutrition: Secondary | ICD-10-CM | POA: Diagnosis not present

## 2015-09-03 DIAGNOSIS — I4891 Unspecified atrial fibrillation: Secondary | ICD-10-CM | POA: Diagnosis not present

## 2015-09-03 DIAGNOSIS — R634 Abnormal weight loss: Secondary | ICD-10-CM | POA: Diagnosis not present

## 2015-09-03 DIAGNOSIS — F0151 Vascular dementia with behavioral disturbance: Secondary | ICD-10-CM | POA: Diagnosis not present

## 2015-09-03 DIAGNOSIS — I495 Sick sinus syndrome: Secondary | ICD-10-CM | POA: Diagnosis not present

## 2015-09-03 DIAGNOSIS — R630 Anorexia: Secondary | ICD-10-CM | POA: Diagnosis not present

## 2015-09-07 DIAGNOSIS — F0151 Vascular dementia with behavioral disturbance: Secondary | ICD-10-CM | POA: Diagnosis not present

## 2015-09-07 DIAGNOSIS — R634 Abnormal weight loss: Secondary | ICD-10-CM | POA: Diagnosis not present

## 2015-09-07 DIAGNOSIS — I4891 Unspecified atrial fibrillation: Secondary | ICD-10-CM | POA: Diagnosis not present

## 2015-09-07 DIAGNOSIS — E46 Unspecified protein-calorie malnutrition: Secondary | ICD-10-CM | POA: Diagnosis not present

## 2015-09-07 DIAGNOSIS — I495 Sick sinus syndrome: Secondary | ICD-10-CM | POA: Diagnosis not present

## 2015-09-07 DIAGNOSIS — R630 Anorexia: Secondary | ICD-10-CM | POA: Diagnosis not present

## 2015-09-08 DIAGNOSIS — R634 Abnormal weight loss: Secondary | ICD-10-CM | POA: Diagnosis not present

## 2015-09-08 DIAGNOSIS — I495 Sick sinus syndrome: Secondary | ICD-10-CM | POA: Diagnosis not present

## 2015-09-08 DIAGNOSIS — F0151 Vascular dementia with behavioral disturbance: Secondary | ICD-10-CM | POA: Diagnosis not present

## 2015-09-08 DIAGNOSIS — I4891 Unspecified atrial fibrillation: Secondary | ICD-10-CM | POA: Diagnosis not present

## 2015-09-08 DIAGNOSIS — R630 Anorexia: Secondary | ICD-10-CM | POA: Diagnosis not present

## 2015-09-08 DIAGNOSIS — E46 Unspecified protein-calorie malnutrition: Secondary | ICD-10-CM | POA: Diagnosis not present

## 2015-09-10 DIAGNOSIS — R634 Abnormal weight loss: Secondary | ICD-10-CM | POA: Diagnosis not present

## 2015-09-10 DIAGNOSIS — I495 Sick sinus syndrome: Secondary | ICD-10-CM | POA: Diagnosis not present

## 2015-09-10 DIAGNOSIS — I4891 Unspecified atrial fibrillation: Secondary | ICD-10-CM | POA: Diagnosis not present

## 2015-09-10 DIAGNOSIS — F0151 Vascular dementia with behavioral disturbance: Secondary | ICD-10-CM | POA: Diagnosis not present

## 2015-09-10 DIAGNOSIS — R630 Anorexia: Secondary | ICD-10-CM | POA: Diagnosis not present

## 2015-09-10 DIAGNOSIS — E46 Unspecified protein-calorie malnutrition: Secondary | ICD-10-CM | POA: Diagnosis not present

## 2015-09-11 DIAGNOSIS — I4891 Unspecified atrial fibrillation: Secondary | ICD-10-CM | POA: Diagnosis not present

## 2015-09-11 DIAGNOSIS — R634 Abnormal weight loss: Secondary | ICD-10-CM | POA: Diagnosis not present

## 2015-09-11 DIAGNOSIS — F0151 Vascular dementia with behavioral disturbance: Secondary | ICD-10-CM | POA: Diagnosis not present

## 2015-09-11 DIAGNOSIS — R630 Anorexia: Secondary | ICD-10-CM | POA: Diagnosis not present

## 2015-09-11 DIAGNOSIS — E46 Unspecified protein-calorie malnutrition: Secondary | ICD-10-CM | POA: Diagnosis not present

## 2015-09-11 DIAGNOSIS — I495 Sick sinus syndrome: Secondary | ICD-10-CM | POA: Diagnosis not present

## 2015-09-14 DIAGNOSIS — I4891 Unspecified atrial fibrillation: Secondary | ICD-10-CM | POA: Diagnosis not present

## 2015-09-14 DIAGNOSIS — F0151 Vascular dementia with behavioral disturbance: Secondary | ICD-10-CM | POA: Diagnosis not present

## 2015-09-14 DIAGNOSIS — R634 Abnormal weight loss: Secondary | ICD-10-CM | POA: Diagnosis not present

## 2015-09-14 DIAGNOSIS — I495 Sick sinus syndrome: Secondary | ICD-10-CM | POA: Diagnosis not present

## 2015-09-14 DIAGNOSIS — E46 Unspecified protein-calorie malnutrition: Secondary | ICD-10-CM | POA: Diagnosis not present

## 2015-09-14 DIAGNOSIS — R630 Anorexia: Secondary | ICD-10-CM | POA: Diagnosis not present

## 2015-09-17 DIAGNOSIS — F0151 Vascular dementia with behavioral disturbance: Secondary | ICD-10-CM | POA: Diagnosis not present

## 2015-09-17 DIAGNOSIS — R630 Anorexia: Secondary | ICD-10-CM | POA: Diagnosis not present

## 2015-09-17 DIAGNOSIS — E46 Unspecified protein-calorie malnutrition: Secondary | ICD-10-CM | POA: Diagnosis not present

## 2015-09-17 DIAGNOSIS — R634 Abnormal weight loss: Secondary | ICD-10-CM | POA: Diagnosis not present

## 2015-09-17 DIAGNOSIS — I4891 Unspecified atrial fibrillation: Secondary | ICD-10-CM | POA: Diagnosis not present

## 2015-09-17 DIAGNOSIS — I495 Sick sinus syndrome: Secondary | ICD-10-CM | POA: Diagnosis not present

## 2015-09-19 DIAGNOSIS — I495 Sick sinus syndrome: Secondary | ICD-10-CM | POA: Diagnosis not present

## 2015-09-19 DIAGNOSIS — R630 Anorexia: Secondary | ICD-10-CM | POA: Diagnosis not present

## 2015-09-19 DIAGNOSIS — R634 Abnormal weight loss: Secondary | ICD-10-CM | POA: Diagnosis not present

## 2015-09-19 DIAGNOSIS — E46 Unspecified protein-calorie malnutrition: Secondary | ICD-10-CM | POA: Diagnosis not present

## 2015-09-19 DIAGNOSIS — I4891 Unspecified atrial fibrillation: Secondary | ICD-10-CM | POA: Diagnosis not present

## 2015-09-19 DIAGNOSIS — F0151 Vascular dementia with behavioral disturbance: Secondary | ICD-10-CM | POA: Diagnosis not present

## 2015-09-21 ENCOUNTER — Non-Acute Institutional Stay (SKILLED_NURSING_FACILITY): Payer: Medicare Other | Admitting: Nurse Practitioner

## 2015-09-21 ENCOUNTER — Encounter: Payer: Self-pay | Admitting: Nurse Practitioner

## 2015-09-21 DIAGNOSIS — E039 Hypothyroidism, unspecified: Secondary | ICD-10-CM | POA: Diagnosis not present

## 2015-09-21 DIAGNOSIS — H547 Unspecified visual loss: Secondary | ICD-10-CM

## 2015-09-21 DIAGNOSIS — I4891 Unspecified atrial fibrillation: Secondary | ICD-10-CM | POA: Diagnosis not present

## 2015-09-21 DIAGNOSIS — R627 Adult failure to thrive: Secondary | ICD-10-CM

## 2015-09-21 DIAGNOSIS — E113419 Type 2 diabetes mellitus with severe nonproliferative diabetic retinopathy with macular edema, unspecified eye: Secondary | ICD-10-CM | POA: Diagnosis not present

## 2015-09-21 DIAGNOSIS — F329 Major depressive disorder, single episode, unspecified: Secondary | ICD-10-CM | POA: Diagnosis not present

## 2015-09-21 DIAGNOSIS — F0390 Unspecified dementia without behavioral disturbance: Secondary | ICD-10-CM

## 2015-09-21 DIAGNOSIS — H540 Blindness, both eyes: Secondary | ICD-10-CM | POA: Diagnosis not present

## 2015-09-21 DIAGNOSIS — I10 Essential (primary) hypertension: Secondary | ICD-10-CM

## 2015-09-21 DIAGNOSIS — K5900 Constipation, unspecified: Secondary | ICD-10-CM

## 2015-09-21 DIAGNOSIS — F32A Depression, unspecified: Secondary | ICD-10-CM

## 2015-09-21 NOTE — Assessment & Plan Note (Signed)
Controlled, off Metoprolol 50mg daily and Amlodipine 10mg daily. 

## 2015-09-21 NOTE — Assessment & Plan Note (Signed)
Heart rate is in control, off Metoprolol.  

## 2015-09-21 NOTE — Assessment & Plan Note (Signed)
Persists, HOH, low vision, memory deficits, emotional liability, weight loss. Continue supportive care, continue prn Morphine, Atropine sol, Lorazepam for comfort measures.  

## 2015-09-21 NOTE — Assessment & Plan Note (Signed)
04/03/14 MMSE 22/30.  Comfort measures only, stabilized  

## 2015-09-21 NOTE — Progress Notes (Signed)
Patient ID: [name]. Knodel, female   DOB: 27-Jan-1915, 82 y.o.   MRN: [id number]  Location:  [facility] Room Number: 105 Place of Service:  SNF (31) Provider: Arna Snipe Leven Hoel NP  GREEN, Lenon Curt, MD  Patient Care Team: Kimber Relic, MD as PCP - General (Internal Medicine) Lynetta Tomczak X, NP as Nurse Practitioner (Nurse Practitioner) [facility] Harle Stanford., MD as Consulting Physician (Urology) Rachael Fee, MD as Consulting Physician (Gastroenterology) Salvatore Marvel, MD as Consulting Physician (Orthopedic Surgery) Venancio Poisson, MD as Consulting Physician (Dermatology) Sheral Apley, MD as Attending Physician (Orthopedic Surgery)  Extended Emergency Contact Information Primary Emergency Contact: Cook,[name] Address: [address]          [place], [place] [address] [place] Home Phone: [phone] Work Phone: [phone] Mobile Phone: [phone] Relation: Son Secondary Emergency Contact: Lennox,[name] Address: [address]          [place], [place] [address] [place] Home Phone: [phone] Relation: Relative  Code Status:  DNR Goals of care: Advanced Directive information Advanced Directives 09/21/2015  Does patient have an advance directive? Yes  Type of Estate agent of [place];Living will;Out of facility DNR (pink MOST or yellow form)  Does patient want to make changes to advanced directive? No - Patient declined  Copy of advanced directive(s) in chart? Yes     Chief Complaint  Patient presents with  . Medical Management of Chronic Issues    Routine visit    HPI:  Pt is a 82 y.o. female seen today for medical management of chronic diseases.     Past Medical History  Diagnosis Date  . HTN (hypertension)   . Other and unspecified hyperlipidemia   . Hemorrhoids   . AF (atrial fibrillation) (HCC)   . Rectal bleeding   . Sinus node dysfunction (HCC)   . Hip fracture (HCC)     . Bradycardia   . Aortic stenosis   . Mitral valve prolapse   . Hearing loss   . Disturbance of salivary secretion [id number]  . Unspecified hereditary and idiopathic peripheral neuropathy 10/28/202012  . Closed fracture of unspecified trochanteric section of femur 05/29/2011  . Anxiety state, unspecified 08/19/2010  . Diffuse cystic mastopathy 12/23/2009  . Flatulence, eructation, and gas pain 12/03/2009  . Closed fracture of lumbar vertebra without mention of spinal cord injury 109-10-2008  . Personal history of fall 01/29/2009  . Occlusion and stenosis of carotid artery without mention of cerebral infarction 12/27/2007  . Syncope and collapse 12/27/2007  . Other malaise and fatigue 12/06/2007  . Unspecified urinary incontinence 02/22/2005  . Cardiac pacemaker in situ 09/12/2004  . Unspecified hypothyroidism 03/18/2010  . Abnormality of gait 09/08/2003  . Edema 08/18/2003  . Macular degeneration (senile) of retina, unspecified 12/12/2001  . Senile osteoporosis 12/13/2000  . Legal blindness, as defined in [place] 03/20/1999    secondary to macular degeneration  . Fracture of greater trochanter of left femur (HCC) 03/08/14  . Atrial fibrillation (HCC) 07/14/2009    Qualifier: Diagnosis of  By: [name], MD, Ruthann Cancer Ty Hilts   . Constipation 03/14/2014  . Dementia 2013    04/03/14 MMSE 22/30   . Depression 04/10/2014  . Fall 12/19/2012    07/22/14 fall VS 144/76, 60, 20, 97.1. No apparent injury   . FTT (failure to thrive) in adult 12/15/2014  . Hypothyroidism 12/19/2007    Qualifier: Diagnosis of  By: Melvyn Neth CMA (AAMA), Patty  01/16/14 TSH 2.474 03/24/14 TSH 2.576    . Loss of weight 10/13/2014    Multiple factorials: dementia, depression, advanced age, possible AR of medications(metformin and Oxybutynin)-will increase Mirtazapine and continue supplement. Observe.    . Macular degeneration of both eyes 01/24/2013    Severe visual impairment. Nearly blind.   Margie Ege  medtronic 09/07/2011  . Trochanteric  fracture of left femur (HCC) 03/14/2014    CT scan did however reveal acute fracture in greater left trochanteric. Dr. Margarita Rana consulted. Recommended nonoperative management and weightbearing as tolerated and another 3 weeks of Lovenox for DVT prophylaxis.       . Type 2 diabetes mellitus with blindness, with macular edema, with severe nonproliferative retinopathy 12/19/2007    Qualifier: Diagnosis of  By: Melvyn Neth CMA (AAMA), Patty  01/09/14 Hgb A1c 6.5 01/16/14 Hgb A1c 6.5 03/24/14 Hgb 6.4 12/10/14 pharm: dc Metformin and CBG monitoring-failure to thrive and refusing to eat.     . Urinary tract infection, site not specified 04/10/2014  . Xerophthalmus 01/21/2015   Past Surgical History  Procedure Laterality Date  . Pacemaker insertion  2006    Medtronic In-Pulse  . Left hip surgery  09/2005  . Right hip surgery  08/2003  . 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 Tonsillectomy    . Breast biopsy Left 1970    benign    Allergies  Allergen Reactions  . Codeine     unknown  . Ibuprofen     unknown  . Lisinopril     unknown  . Penicillins     unknown  . Sanctura [Trospium Chloride]     unknown      Medication List       This list is accurate as of: 09/21/15  4:58 PM.  Always use your most recent med list.               atropine 1 % ophthalmic solution  Place 4 drops every 2 hours as needed for excess secretions     LORazepam 0.5 MG tablet  Commonly known as:  ATIVAN  every 8 (eight) hours. Take 1 tablet by mouth every 2 hours as needed for restlessness or agitation     morphine 20 MG/ML concentrated solution  Commonly known as:  ROXANOL  Give 0.28ml ( ) by mouth every two hours as needed     sennosides-docusate sodium 8.6-50 MG tablet  Commonly known as:  SENOKOT-S  Take two tablets by mouth at bedtime     TYLENOL 325 MG tablet  Generic drug:  acetaminophen  Take 650 mg by mouth every 4 (four)  hours as needed for mild pain or moderate pain.        Review of Systems  Immunization History  Administered Date(s) Administered  . Influenza-Unspecified 06/06/2013, 04/21/2015  . PPD Test 07/23/2011  . Pneumococcal Polysaccharide-23 03/14/2009  . Td 03/14/2009  . Zoster 06/29/2006   Pertinent  Health Maintenance Due  Topic Date Due  . OPHTHALMOLOGY EXAM  07/28/1927  . URINE MICROALBUMIN  07/28/1927  . DEXA SCAN  07/27/1982  . PNA vac Low Risk Adult (2 of 2 - PCV13) 03/14/2010  . HEMOGLOBIN A1C  07/29/2015  . FOOT EXAM  01/26/2016  . INFLUENZA VACCINE  03/01/2016   Fall Risk  01/26/2015 03/06/2014  Falls in the past year? Yes Yes  Number falls in past yr: 1 1  Injury with Fall? Yes Yes  Risk Factor Category  - High Fall Risk  Risk for fall due to : History of fall(s);Impaired balance/gait;Impaired mobility -  Follow up Falls evaluation completed -   Functional Status Survey:    Filed Vitals:   09/21/15 1136  BP: 118/74  Pulse: 72  Temp: 96.8 F (36 C)  TempSrc: Oral  Resp: 18  Height: 5\' 2"  (1.575 m)  Weight: 99 lb 3.2 oz (44.997 kg)   Body mass index is 18.14 kg/(m^2). Physical Exam  Labs reviewed:  Recent Labs  01/27/15  NA 141  K 4.3  BUN 11  CREATININE 0.9    Recent Labs  01/27/15  AST 24  ALT 10  ALKPHOS 80    Recent Labs  01/27/15  WBC 10.8  HGB 15.3  HCT 45  PLT 427*   Lab Results  Component Value Date   TSH 3.55 01/27/2015   Lab Results  Component Value Date   HGBA1C 5.8 01/27/2015   Lab Results  Component Value Date   CHOL 140 12/20/2012   HDL 38 12/20/2012   LDLCALC 57 12/20/2012   TRIG 227* 12/20/2012   CHOLHDL 5.0 12/22/2007    Significant Diagnostic Results in last 30 days:  No results found.  Assessment/Plan Problem List Items Addressed This Visit    Hypothyroidism - Primary (Chronic)    Last TSH wnl 3.554 12/2014, off Levothyroxine 12.70mcg      Type 2 diabetes mellitus with blindness, severe  nonproliferative retinopathy, and macular edema (Chronic)    Diet controlled, last Hgb A1c 5.8 01/27/15      Essential hypertension (Chronic)    Controlled, off Metoprolol 50mg  daily and Amlodipine 10mg  daily.      Dementia (Chronic)    04/03/14 MMSE 22/30.  Comfort measures only, stabilized       Depression (Chronic)    Mood is stable, off Mirtazapine.       FTT (failure to thrive) in adult (Chronic)    Persists, HOH, low vision, memory deficits, emotional liability, weight loss. Continue supportive care, continue prn Morphine, Atropine sol, Lorazepam for comfort measures.       ATRIAL FIBRILLATION    Heart rate is in control, off Metoprolol.      Constipation    Stable, continue Senna S II nightly.           Family/ staff Communication: continue SNF for care needs.   Labs/tests ordered:  none

## 2015-09-21 NOTE — Assessment & Plan Note (Signed)
Diet controlled, last Hgb A1c 5.8 01/27/15 

## 2015-09-21 NOTE — Assessment & Plan Note (Signed)
Last TSH wnl 3.554 12/2014, off Levothyroxine 12.5mcg 

## 2015-09-21 NOTE — Assessment & Plan Note (Signed)
Mood is stable, off Mirtazapine.  

## 2015-09-21 NOTE — Assessment & Plan Note (Signed)
Stable, continue Senna S II nightly.  

## 2015-09-24 DIAGNOSIS — E46 Unspecified protein-calorie malnutrition: Secondary | ICD-10-CM | POA: Diagnosis not present

## 2015-09-24 DIAGNOSIS — I495 Sick sinus syndrome: Secondary | ICD-10-CM | POA: Diagnosis not present

## 2015-09-24 DIAGNOSIS — I4891 Unspecified atrial fibrillation: Secondary | ICD-10-CM | POA: Diagnosis not present

## 2015-09-24 DIAGNOSIS — R634 Abnormal weight loss: Secondary | ICD-10-CM | POA: Diagnosis not present

## 2015-09-24 DIAGNOSIS — R630 Anorexia: Secondary | ICD-10-CM | POA: Diagnosis not present

## 2015-09-24 DIAGNOSIS — F0151 Vascular dementia with behavioral disturbance: Secondary | ICD-10-CM | POA: Diagnosis not present

## 2015-09-25 DIAGNOSIS — R634 Abnormal weight loss: Secondary | ICD-10-CM | POA: Diagnosis not present

## 2015-09-25 DIAGNOSIS — I495 Sick sinus syndrome: Secondary | ICD-10-CM | POA: Diagnosis not present

## 2015-09-25 DIAGNOSIS — F0151 Vascular dementia with behavioral disturbance: Secondary | ICD-10-CM | POA: Diagnosis not present

## 2015-09-25 DIAGNOSIS — R630 Anorexia: Secondary | ICD-10-CM | POA: Diagnosis not present

## 2015-09-25 DIAGNOSIS — I4891 Unspecified atrial fibrillation: Secondary | ICD-10-CM | POA: Diagnosis not present

## 2015-09-25 DIAGNOSIS — E46 Unspecified protein-calorie malnutrition: Secondary | ICD-10-CM | POA: Diagnosis not present

## 2015-09-28 DIAGNOSIS — F0151 Vascular dementia with behavioral disturbance: Secondary | ICD-10-CM | POA: Diagnosis not present

## 2015-09-28 DIAGNOSIS — R634 Abnormal weight loss: Secondary | ICD-10-CM | POA: Diagnosis not present

## 2015-09-28 DIAGNOSIS — I4891 Unspecified atrial fibrillation: Secondary | ICD-10-CM | POA: Diagnosis not present

## 2015-09-28 DIAGNOSIS — E46 Unspecified protein-calorie malnutrition: Secondary | ICD-10-CM | POA: Diagnosis not present

## 2015-09-28 DIAGNOSIS — R630 Anorexia: Secondary | ICD-10-CM | POA: Diagnosis not present

## 2015-09-28 DIAGNOSIS — I495 Sick sinus syndrome: Secondary | ICD-10-CM | POA: Diagnosis not present

## 2015-09-30 DIAGNOSIS — I4891 Unspecified atrial fibrillation: Secondary | ICD-10-CM | POA: Diagnosis not present

## 2015-09-30 DIAGNOSIS — E1159 Type 2 diabetes mellitus with other circulatory complications: Secondary | ICD-10-CM | POA: Diagnosis not present

## 2015-09-30 DIAGNOSIS — F0151 Vascular dementia with behavioral disturbance: Secondary | ICD-10-CM | POA: Diagnosis not present

## 2015-09-30 DIAGNOSIS — E039 Hypothyroidism, unspecified: Secondary | ICD-10-CM | POA: Diagnosis not present

## 2015-09-30 DIAGNOSIS — R630 Anorexia: Secondary | ICD-10-CM | POA: Diagnosis not present

## 2015-09-30 DIAGNOSIS — E785 Hyperlipidemia, unspecified: Secondary | ICD-10-CM | POA: Diagnosis not present

## 2015-09-30 DIAGNOSIS — I10 Essential (primary) hypertension: Secondary | ICD-10-CM | POA: Diagnosis not present

## 2015-09-30 DIAGNOSIS — I495 Sick sinus syndrome: Secondary | ICD-10-CM | POA: Diagnosis not present

## 2015-09-30 DIAGNOSIS — E46 Unspecified protein-calorie malnutrition: Secondary | ICD-10-CM | POA: Diagnosis not present

## 2015-09-30 DIAGNOSIS — R634 Abnormal weight loss: Secondary | ICD-10-CM | POA: Diagnosis not present

## 2015-09-30 DIAGNOSIS — F339 Major depressive disorder, recurrent, unspecified: Secondary | ICD-10-CM | POA: Diagnosis not present

## 2015-09-30 DIAGNOSIS — H3530 Unspecified macular degeneration: Secondary | ICD-10-CM | POA: Diagnosis not present

## 2015-10-01 DIAGNOSIS — E46 Unspecified protein-calorie malnutrition: Secondary | ICD-10-CM | POA: Diagnosis not present

## 2015-10-01 DIAGNOSIS — R634 Abnormal weight loss: Secondary | ICD-10-CM | POA: Diagnosis not present

## 2015-10-01 DIAGNOSIS — R630 Anorexia: Secondary | ICD-10-CM | POA: Diagnosis not present

## 2015-10-01 DIAGNOSIS — F0151 Vascular dementia with behavioral disturbance: Secondary | ICD-10-CM | POA: Diagnosis not present

## 2015-10-01 DIAGNOSIS — I4891 Unspecified atrial fibrillation: Secondary | ICD-10-CM | POA: Diagnosis not present

## 2015-10-01 DIAGNOSIS — I495 Sick sinus syndrome: Secondary | ICD-10-CM | POA: Diagnosis not present

## 2015-10-05 DIAGNOSIS — E46 Unspecified protein-calorie malnutrition: Secondary | ICD-10-CM | POA: Diagnosis not present

## 2015-10-05 DIAGNOSIS — R634 Abnormal weight loss: Secondary | ICD-10-CM | POA: Diagnosis not present

## 2015-10-05 DIAGNOSIS — I4891 Unspecified atrial fibrillation: Secondary | ICD-10-CM | POA: Diagnosis not present

## 2015-10-05 DIAGNOSIS — I495 Sick sinus syndrome: Secondary | ICD-10-CM | POA: Diagnosis not present

## 2015-10-05 DIAGNOSIS — F0151 Vascular dementia with behavioral disturbance: Secondary | ICD-10-CM | POA: Diagnosis not present

## 2015-10-05 DIAGNOSIS — R630 Anorexia: Secondary | ICD-10-CM | POA: Diagnosis not present

## 2015-10-07 DIAGNOSIS — I4891 Unspecified atrial fibrillation: Secondary | ICD-10-CM | POA: Diagnosis not present

## 2015-10-07 DIAGNOSIS — R634 Abnormal weight loss: Secondary | ICD-10-CM | POA: Diagnosis not present

## 2015-10-07 DIAGNOSIS — R630 Anorexia: Secondary | ICD-10-CM | POA: Diagnosis not present

## 2015-10-07 DIAGNOSIS — E46 Unspecified protein-calorie malnutrition: Secondary | ICD-10-CM | POA: Diagnosis not present

## 2015-10-07 DIAGNOSIS — I495 Sick sinus syndrome: Secondary | ICD-10-CM | POA: Diagnosis not present

## 2015-10-07 DIAGNOSIS — F0151 Vascular dementia with behavioral disturbance: Secondary | ICD-10-CM | POA: Diagnosis not present

## 2015-10-12 DIAGNOSIS — R634 Abnormal weight loss: Secondary | ICD-10-CM | POA: Diagnosis not present

## 2015-10-12 DIAGNOSIS — I495 Sick sinus syndrome: Secondary | ICD-10-CM | POA: Diagnosis not present

## 2015-10-12 DIAGNOSIS — R630 Anorexia: Secondary | ICD-10-CM | POA: Diagnosis not present

## 2015-10-12 DIAGNOSIS — F0151 Vascular dementia with behavioral disturbance: Secondary | ICD-10-CM | POA: Diagnosis not present

## 2015-10-12 DIAGNOSIS — E46 Unspecified protein-calorie malnutrition: Secondary | ICD-10-CM | POA: Diagnosis not present

## 2015-10-12 DIAGNOSIS — I4891 Unspecified atrial fibrillation: Secondary | ICD-10-CM | POA: Diagnosis not present

## 2015-10-13 DIAGNOSIS — R630 Anorexia: Secondary | ICD-10-CM | POA: Diagnosis not present

## 2015-10-13 DIAGNOSIS — F0151 Vascular dementia with behavioral disturbance: Secondary | ICD-10-CM | POA: Diagnosis not present

## 2015-10-13 DIAGNOSIS — I495 Sick sinus syndrome: Secondary | ICD-10-CM | POA: Diagnosis not present

## 2015-10-13 DIAGNOSIS — R634 Abnormal weight loss: Secondary | ICD-10-CM | POA: Diagnosis not present

## 2015-10-13 DIAGNOSIS — E46 Unspecified protein-calorie malnutrition: Secondary | ICD-10-CM | POA: Diagnosis not present

## 2015-10-13 DIAGNOSIS — I4891 Unspecified atrial fibrillation: Secondary | ICD-10-CM | POA: Diagnosis not present

## 2015-10-15 ENCOUNTER — Encounter: Payer: Self-pay | Admitting: Nurse Practitioner

## 2015-10-15 NOTE — Assessment & Plan Note (Signed)
Controlled, off Metoprolol 50mg daily and Amlodipine 10mg daily. 

## 2015-10-15 NOTE — Assessment & Plan Note (Signed)
Persists, HOH, low vision, memory deficits, emotional liability, weight loss. Continue supportive care, continue prn Morphine, Atropine sol, Lorazepam for comfort measures.  

## 2015-10-15 NOTE — Progress Notes (Signed)
Patient ID: [name]. Kleine, female   DOB: 12/11/1914, 82 y.o.   MRN: [id number]  Location:  [facility] Room Number: 105 Place of Service:  SNF (31) Provider: Arna Snipe Mast NP  GREEN, Lenon Curt, MD  Patient Care Team: Kimber Relic, MD as PCP - General (Internal Medicine) Man Mast X, NP as Nurse Practitioner (Nurse Practitioner) [facility] Harle Stanford., MD as Consulting Physician (Urology) Rachael Fee, MD as Consulting Physician (Gastroenterology) Salvatore Marvel, MD as Consulting Physician (Orthopedic Surgery) Venancio Poisson, MD as Consulting Physician (Dermatology) Sheral Apley, MD as Attending Physician (Orthopedic Surgery)  Extended Emergency Contact Information Primary Emergency Contact: Cook,[name] Address: [address]          [place], [place] [address] [place] of [place] Home Phone: [phone] Work Phone: [phone] Mobile Phone: [phone] Relation: Son Secondary Emergency Contact: Cappuccio,[name] Address: [address]          [place], [place] [address] [place] Home Phone: [phone] Relation: Relative  Code Status:  DNR Goals of care: Advanced Directive information Advanced Directives 10/16/2015  Does patient have an advance directive? -  Type of Estate agent of [place];Out of facility DNR (pink MOST or yellow form)  Does patient want to make changes to advanced directive? No - Patient declined  Copy of advanced directive(s) in chart? Yes     Chief Complaint  Patient presents with  . Medical Management of Chronic Issues    Routine Visit    HPI:  Pt is a 82 y.o. female seen today for medical management of chronic diseases.  FTT. Hx of hypothyroidism, last TSH wnl 12/2014 while on Levothyroxine 12.. . Her blood sugar is controlled on diet, last Hgb A1c 5.8 12/2014. Blood pressure and heart rate are controlled on metoprolol. Her mood is stable on Mirtazapine, dementia requires  SNF [facility] for care needs, her urinary incontinent is chronic and managed with adult depends. She has BM daily. She is still able to feed self at meals.    Past Medical History  Diagnosis Date  . HTN (hypertension)   . Other and unspecified hyperlipidemia   . Hemorrhoids   . AF (atrial fibrillation) (HCC)   . Rectal bleeding   . Sinus node dysfunction (HCC)   . Hip fracture (HCC)   . Bradycardia   . Aortic stenosis   . Mitral valve prolapse   . Hearing loss   . Disturbance of salivary secretion [id number]  . Unspecified hereditary and idiopathic peripheral neuropathy 10/28/202012  . Closed fracture of unspecified trochanteric section of femur 05/29/2011  . Anxiety state, unspecified 08/19/2010  . Diffuse cystic mastopathy 12/23/2009  . Flatulence, eructation, and gas pain 12/03/2009  . Closed fracture of lumbar vertebra without mention of spinal cord injury 12020/07/2109  . Personal history of fall 01/29/2009  . Occlusion and stenosis of carotid artery without mention of cerebral infarction 12/27/2007  . Syncope and collapse 12/27/2007  . Other malaise and fatigue 12/06/2007  . Unspecified urinary incontinence 02/22/2005  . Cardiac pacemaker in situ 09/12/2004  . Unspecified hypothyroidism 03/18/2010  . Abnormality of gait 09/08/2003  . Edema 08/18/2003  . Macular degeneration (senile) of retina, unspecified 12/12/2001  . Senile osteoporosis 12/13/2000  . Legal blindness, as defined in [place] 03/20/1999    secondary to macular degeneration  . Fracture of greater trochanter of left femur (HCC) 03/08/14  . Atrial fibrillation (HCC) 07/14/2009    Qualifier: Diagnosis of  By: [name],  MD, Ruthann CancerFACC, Ty HiltsSteven Cochran   . Constipation 03/14/2014  . Dementia 2013    04/03/14 MMSE 22/30   . Depression 04/10/2014  . Fall 12/19/2012    07/22/14 fall VS 144/76, 60, 20, 97.1. No apparent injury   . FTT (failure to thrive) in adult 12/15/2014  . Hypothyroidism 12/19/2007    Qualifier: Diagnosis of  By: Melvyn NethLewis CMA (AAMA), Patty   01/16/14 TSH 2.474 03/24/14 TSH 2.576    . Loss of weight 10/13/2014    Multiple factorials: dementia, depression, advanced age, possible AR of medications(metformin and Oxybutynin)-will increase Mirtazapine and continue supplement. Observe.    . Macular degeneration of both eyes 01/24/2013    Severe visual impairment. Nearly blind.   Margie Ege. Pacemaker-dual  medtronic 09/07/2011  . Trochanteric fracture of left femur (HCC) 03/14/2014    CT scan did however reveal acute fracture in greater left trochanteric. Dr. Margarita Ranaimothy Murphy consulted. Recommended nonoperative management and weightbearing as tolerated and another 3 weeks of Lovenox for DVT prophylaxis.       . Type 2 diabetes mellitus with blindness, with macular edema, with severe nonproliferative retinopathy 12/19/2007    Qualifier: Diagnosis of  By: Melvyn NethLewis CMA (AAMA), Patty  01/09/14 Hgb A1c 6.5 01/16/14 Hgb A1c 6.5 03/24/14 Hgb 6.4 12/10/14 pharm: dc Metformin and CBG monitoring-failure to thrive and refusing to eat.     . Urinary tract infection, site not specified 04/10/2014  . Xerophthalmus 01/21/2015   Past Surgical History  Procedure Laterality Date  . Pacemaker insertion  2006    Medtronic In-Pulse  . Left hip surgery  09/2005  . Right hip surgery  08/2003  . 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 Tonsillectomy    . Breast biopsy Left 1970    benign    Allergies  Allergen Reactions  . Codeine     unknown  . Ibuprofen     unknown  . Lisinopril     unknown  . Penicillins     unknown  . Sanctura [Trospium Chloride]     unknown      Medication List       This list is accurate as of: 10/16/15 11:59 PM.  Always use your most recent med list.               atropine 1 % ophthalmic solution  Place 4 drops every 2 hours as needed for excess secretions     escitalopram 10 MG tablet  Commonly known as:  LEXAPRO  Take 10 mg by mouth daily.     LORazepam 1 MG tablet    Commonly known as:  ATIVAN  Take 1 mg by mouth every 2 (two) hours as needed for anxiety.     morphine 20 MG/ML concentrated solution  Commonly known as:  ROXANOL  Give 0.4225ml (5mg ) by mouth every two hours as needed     sennosides-docusate sodium 8.6-50 MG tablet  Commonly known as:  SENOKOT-S  Take two tablets by mouth at bedtime     TYLENOL 325 MG tablet  Generic drug:  acetaminophen  Take 650 mg by mouth every 4 (four) hours as needed for mild pain or moderate pain.        Review of Systems  Constitutional: Negative for fever and chills.       Emaciated, weight has been stabilized past 3-4 months  HENT: Positive for hearing loss. Negative for congestion, ear discharge and ear pain.  Eyes: Negative for pain, discharge and redness.       Low vision Dry eyes  Respiratory: Positive for cough. Negative for shortness of breath, wheezing and stridor.        Occasional hacking cough, non productive, no O2 desat, afebrile.  Cardiovascular: Negative for chest pain, palpitations and leg swelling.  Gastrointestinal: Negative for nausea, vomiting, abdominal pain and constipation.  Genitourinary: Positive for frequency. Negative for dysuria and urgency.  Musculoskeletal: Negative for myalgias, back pain and neck pain.  Skin: Negative for rash.       Multiple skin tears sustained from fall 06/19/15  Neurological: Negative for dizziness, tremors, seizures, weakness and headaches.  Psychiatric/Behavioral: Negative for suicidal ideas and hallucinations. The patient is nervous/anxious.     Immunization History  Administered Date(s) Administered  . Influenza-Unspecified 06/06/2013, 04/21/2015  . PPD Test 07/23/2011  . Pneumococcal Polysaccharide-23 03/14/2009  . Td 03/14/2009  . Zoster 06/29/2006   Pertinent  Health Maintenance Due  Topic Date Due  . OPHTHALMOLOGY EXAM  07/28/1927  . URINE MICROALBUMIN  07/28/1927  . DEXA SCAN  07/27/1982  . PNA vac Low Risk Adult (2 of 2 -  PCV13) 03/14/2010  . HEMOGLOBIN A1C  07/29/2015  . FOOT EXAM  01/26/2016  . INFLUENZA VACCINE  03/01/2016   Fall Risk  01/26/2015 03/06/2014  Falls in the past year? Yes Yes  Number falls in past yr: 1 1  Injury with Fall? Yes Yes  Risk Factor Category  - High Fall Risk  Risk for fall due to : History of fall(s);Impaired balance/gait;Impaired mobility -  Follow up Falls evaluation completed -   Functional Status Survey:    Filed Vitals:   10/16/15 1107  BP: 118/60  Pulse: 58  Temp: 96.4 F (35.8 C)  TempSrc: Oral  Resp: 18  Height:  (1.575 m)  Weight: 100 lb 12.8 oz (45.723 kg)   Body mass index is 18.43 kg/(m^2). Physical Exam  Constitutional: She is oriented to person, place, and time.  Extremely thin  HENT:  Head: Normocephalic and atraumatic.  Right Ear: External ear normal.  Left Ear: External ear normal.  Nose: Nose normal.  Mouth/Throat: Oropharynx is clear and moist. No oropharyngeal exudate.  Eyes: Conjunctivae and EOM are normal. Pupils are equal, round, and reactive to light. Right eye exhibits no discharge. Left eye exhibits no discharge. No scleral icterus.  Dry eyes  Neck: Normal range of motion. Neck supple. No JVD present. No thyromegaly present.  Cardiovascular: Normal rate.   Murmur heard. Pacemaker. Systolic ejection murmur 3/6  Pulmonary/Chest: Effort normal and breath sounds normal. No respiratory distress. She has no wheezes. She has no rales. She exhibits no tenderness.  Abdominal: Soft. Bowel sounds are normal. She exhibits no distension. There is no tenderness. There is no rebound.  Musculoskeletal: Normal range of motion. She exhibits no edema or tenderness.  Lymphadenopathy:    She has no cervical adenopathy.  Neurological: She is alert and oriented to person, place, and time. She has normal reflexes. No cranial nerve deficit. She exhibits normal muscle tone. Coordination normal.  Skin: Skin is warm and dry. No rash noted. She is not  diaphoretic. No erythema. No pallor.  Multiple skin tears above the left eye, back of neck, and left elbow. No s/s of infection.   Psychiatric: Thought content normal. Her mood appears not anxious. Her affect is not angry, not blunt, not labile and not inappropriate. Her speech is not rapid and/or pressured, not delayed and not  slurred. She is slowed. She is not agitated, not aggressive, not hyperactive, not withdrawn, not actively hallucinating and not combative. Thought content is not paranoid and not delusional. Cognition and memory are impaired. She expresses impulsivity and inappropriate judgment. She exhibits a depressed mood. She exhibits abnormal recent memory.  Confusion, HOH, very low vision. Guards against examination. She is attentive.    Labs reviewed:  Recent Labs  01/27/15  NA 141  K 4.3  BUN 11  CREATININE 0.9    Recent Labs  01/27/15  AST 24  ALT 10  ALKPHOS 80    Recent Labs  01/27/15  WBC 10.8  HGB 15.3  HCT 45  PLT 427*   Lab Results  Component Value Date   TSH 3.55 01/27/2015   Lab Results  Component Value Date   HGBA1C 5.8 01/27/2015   Lab Results  Component Value Date   CHOL 140 12/20/2012   HDL 38 12/20/2012   LDLCALC 57 12/20/2012   TRIG 227* 12/20/2012   CHOLHDL 5.0 12/22/2007    Significant Diagnostic Results in last 30 days:  No results found.  Assessment/Plan  ATRIAL FIBRILLATION Heart rate is in control, off Metoprolol.  Constipation Stable, continue Senna S II nightly.   Dementia 04/03/14 MMSE 22/30.  Comfort measures only, stabilized   Depression Mood is stable, off Mirtazapine.   Essential hypertension Controlled, off Metoprolol 50mg  daily and Amlodipine 10mg  daily.  FTT (failure to thrive) in adult Persists, HOH, low vision, memory deficits, emotional liability, weight loss. Continue supportive care, continue prn Morphine, Atropine sol, Lorazepam for comfort measures.   Hypothyroidism Last TSH wnl 3.554 12/2014,  off Levothyroxine 12.25mcg  Type 2 diabetes mellitus with blindness, severe nonproliferative retinopathy, and macular edema Diet controlled, last Hgb A1c 5.8 01/27/15    Family/ staff Communication: continue SNF for care needs  Labs/tests ordered:  none

## 2015-10-15 NOTE — Assessment & Plan Note (Signed)
Diet controlled, last Hgb A1c 5.8 01/27/15 

## 2015-10-15 NOTE — Assessment & Plan Note (Signed)
Mood is stable, off Mirtazapine.  

## 2015-10-15 NOTE — Assessment & Plan Note (Signed)
04/03/14 MMSE 22/30.  Comfort measures only, stabilized  

## 2015-10-15 NOTE — Assessment & Plan Note (Signed)
Heart rate is in control, off Metoprolol.  

## 2015-10-15 NOTE — Assessment & Plan Note (Signed)
Last TSH wnl 3.554 12/2014, off Levothyroxine 12.5mcg 

## 2015-10-15 NOTE — Assessment & Plan Note (Signed)
Stable, continue Senna S II nightly.  

## 2015-10-16 ENCOUNTER — Non-Acute Institutional Stay (SKILLED_NURSING_FACILITY): Payer: Medicare Other | Admitting: Nurse Practitioner

## 2015-10-16 ENCOUNTER — Encounter: Payer: Self-pay | Admitting: Nurse Practitioner

## 2015-10-16 DIAGNOSIS — R627 Adult failure to thrive: Secondary | ICD-10-CM | POA: Diagnosis not present

## 2015-10-16 DIAGNOSIS — H547 Unspecified visual loss: Secondary | ICD-10-CM

## 2015-10-16 DIAGNOSIS — H540 Blindness, both eyes: Secondary | ICD-10-CM | POA: Diagnosis not present

## 2015-10-16 DIAGNOSIS — I10 Essential (primary) hypertension: Secondary | ICD-10-CM | POA: Diagnosis not present

## 2015-10-16 DIAGNOSIS — K5900 Constipation, unspecified: Secondary | ICD-10-CM | POA: Diagnosis not present

## 2015-10-16 DIAGNOSIS — E039 Hypothyroidism, unspecified: Secondary | ICD-10-CM

## 2015-10-16 DIAGNOSIS — E113419 Type 2 diabetes mellitus with severe nonproliferative diabetic retinopathy with macular edema, unspecified eye: Secondary | ICD-10-CM | POA: Diagnosis not present

## 2015-10-16 DIAGNOSIS — F329 Major depressive disorder, single episode, unspecified: Secondary | ICD-10-CM | POA: Diagnosis not present

## 2015-10-16 DIAGNOSIS — F0390 Unspecified dementia without behavioral disturbance: Secondary | ICD-10-CM

## 2015-10-16 DIAGNOSIS — I4891 Unspecified atrial fibrillation: Secondary | ICD-10-CM | POA: Diagnosis not present

## 2015-10-16 DIAGNOSIS — F32A Depression, unspecified: Secondary | ICD-10-CM

## 2015-10-20 DIAGNOSIS — I495 Sick sinus syndrome: Secondary | ICD-10-CM | POA: Diagnosis not present

## 2015-10-20 DIAGNOSIS — I4891 Unspecified atrial fibrillation: Secondary | ICD-10-CM | POA: Diagnosis not present

## 2015-10-20 DIAGNOSIS — R634 Abnormal weight loss: Secondary | ICD-10-CM | POA: Diagnosis not present

## 2015-10-20 DIAGNOSIS — F0151 Vascular dementia with behavioral disturbance: Secondary | ICD-10-CM | POA: Diagnosis not present

## 2015-10-20 DIAGNOSIS — E46 Unspecified protein-calorie malnutrition: Secondary | ICD-10-CM | POA: Diagnosis not present

## 2015-10-20 DIAGNOSIS — R630 Anorexia: Secondary | ICD-10-CM | POA: Diagnosis not present

## 2015-10-26 ENCOUNTER — Encounter: Payer: Self-pay | Admitting: Nurse Practitioner

## 2015-10-26 DIAGNOSIS — R630 Anorexia: Secondary | ICD-10-CM | POA: Diagnosis not present

## 2015-10-26 DIAGNOSIS — R634 Abnormal weight loss: Secondary | ICD-10-CM | POA: Diagnosis not present

## 2015-10-26 DIAGNOSIS — E46 Unspecified protein-calorie malnutrition: Secondary | ICD-10-CM | POA: Diagnosis not present

## 2015-10-26 DIAGNOSIS — F0151 Vascular dementia with behavioral disturbance: Secondary | ICD-10-CM | POA: Diagnosis not present

## 2015-10-26 DIAGNOSIS — I495 Sick sinus syndrome: Secondary | ICD-10-CM | POA: Diagnosis not present

## 2015-10-26 DIAGNOSIS — I4891 Unspecified atrial fibrillation: Secondary | ICD-10-CM | POA: Diagnosis not present

## 2015-10-26 NOTE — Progress Notes (Signed)
This encounter was created in error - please disregard.

## 2015-10-27 DIAGNOSIS — R634 Abnormal weight loss: Secondary | ICD-10-CM | POA: Diagnosis not present

## 2015-10-27 DIAGNOSIS — F0151 Vascular dementia with behavioral disturbance: Secondary | ICD-10-CM | POA: Diagnosis not present

## 2015-10-27 DIAGNOSIS — I4891 Unspecified atrial fibrillation: Secondary | ICD-10-CM | POA: Diagnosis not present

## 2015-10-27 DIAGNOSIS — I495 Sick sinus syndrome: Secondary | ICD-10-CM | POA: Diagnosis not present

## 2015-10-27 DIAGNOSIS — R630 Anorexia: Secondary | ICD-10-CM | POA: Diagnosis not present

## 2015-10-27 DIAGNOSIS — E46 Unspecified protein-calorie malnutrition: Secondary | ICD-10-CM | POA: Diagnosis not present

## 2015-10-30 DIAGNOSIS — R634 Abnormal weight loss: Secondary | ICD-10-CM | POA: Diagnosis not present

## 2015-10-30 DIAGNOSIS — F0151 Vascular dementia with behavioral disturbance: Secondary | ICD-10-CM | POA: Diagnosis not present

## 2015-10-30 DIAGNOSIS — I4891 Unspecified atrial fibrillation: Secondary | ICD-10-CM | POA: Diagnosis not present

## 2015-10-30 DIAGNOSIS — I495 Sick sinus syndrome: Secondary | ICD-10-CM | POA: Diagnosis not present

## 2015-10-30 DIAGNOSIS — E46 Unspecified protein-calorie malnutrition: Secondary | ICD-10-CM | POA: Diagnosis not present

## 2015-10-30 DIAGNOSIS — R630 Anorexia: Secondary | ICD-10-CM | POA: Diagnosis not present

## 2015-10-31 DIAGNOSIS — I4891 Unspecified atrial fibrillation: Secondary | ICD-10-CM | POA: Diagnosis not present

## 2015-10-31 DIAGNOSIS — F0151 Vascular dementia with behavioral disturbance: Secondary | ICD-10-CM | POA: Diagnosis not present

## 2015-10-31 DIAGNOSIS — E039 Hypothyroidism, unspecified: Secondary | ICD-10-CM | POA: Diagnosis not present

## 2015-10-31 DIAGNOSIS — E785 Hyperlipidemia, unspecified: Secondary | ICD-10-CM | POA: Diagnosis not present

## 2015-10-31 DIAGNOSIS — I10 Essential (primary) hypertension: Secondary | ICD-10-CM | POA: Diagnosis not present

## 2015-10-31 DIAGNOSIS — I495 Sick sinus syndrome: Secondary | ICD-10-CM | POA: Diagnosis not present

## 2015-10-31 DIAGNOSIS — R630 Anorexia: Secondary | ICD-10-CM | POA: Diagnosis not present

## 2015-10-31 DIAGNOSIS — F339 Major depressive disorder, recurrent, unspecified: Secondary | ICD-10-CM | POA: Diagnosis not present

## 2015-10-31 DIAGNOSIS — R634 Abnormal weight loss: Secondary | ICD-10-CM | POA: Diagnosis not present

## 2015-10-31 DIAGNOSIS — E1159 Type 2 diabetes mellitus with other circulatory complications: Secondary | ICD-10-CM | POA: Diagnosis not present

## 2015-10-31 DIAGNOSIS — H3530 Unspecified macular degeneration: Secondary | ICD-10-CM | POA: Diagnosis not present

## 2015-10-31 DIAGNOSIS — E46 Unspecified protein-calorie malnutrition: Secondary | ICD-10-CM | POA: Diagnosis not present

## 2015-11-04 DIAGNOSIS — I495 Sick sinus syndrome: Secondary | ICD-10-CM | POA: Diagnosis not present

## 2015-11-04 DIAGNOSIS — R630 Anorexia: Secondary | ICD-10-CM | POA: Diagnosis not present

## 2015-11-04 DIAGNOSIS — F0151 Vascular dementia with behavioral disturbance: Secondary | ICD-10-CM | POA: Diagnosis not present

## 2015-11-04 DIAGNOSIS — I4891 Unspecified atrial fibrillation: Secondary | ICD-10-CM | POA: Diagnosis not present

## 2015-11-04 DIAGNOSIS — E46 Unspecified protein-calorie malnutrition: Secondary | ICD-10-CM | POA: Diagnosis not present

## 2015-11-04 DIAGNOSIS — R634 Abnormal weight loss: Secondary | ICD-10-CM | POA: Diagnosis not present

## 2015-11-05 DIAGNOSIS — I495 Sick sinus syndrome: Secondary | ICD-10-CM | POA: Diagnosis not present

## 2015-11-05 DIAGNOSIS — I4891 Unspecified atrial fibrillation: Secondary | ICD-10-CM | POA: Diagnosis not present

## 2015-11-05 DIAGNOSIS — R630 Anorexia: Secondary | ICD-10-CM | POA: Diagnosis not present

## 2015-11-05 DIAGNOSIS — E46 Unspecified protein-calorie malnutrition: Secondary | ICD-10-CM | POA: Diagnosis not present

## 2015-11-05 DIAGNOSIS — R634 Abnormal weight loss: Secondary | ICD-10-CM | POA: Diagnosis not present

## 2015-11-05 DIAGNOSIS — F0151 Vascular dementia with behavioral disturbance: Secondary | ICD-10-CM | POA: Diagnosis not present

## 2015-11-10 DIAGNOSIS — F0151 Vascular dementia with behavioral disturbance: Secondary | ICD-10-CM | POA: Diagnosis not present

## 2015-11-10 DIAGNOSIS — I495 Sick sinus syndrome: Secondary | ICD-10-CM | POA: Diagnosis not present

## 2015-11-10 DIAGNOSIS — R630 Anorexia: Secondary | ICD-10-CM | POA: Diagnosis not present

## 2015-11-10 DIAGNOSIS — R634 Abnormal weight loss: Secondary | ICD-10-CM | POA: Diagnosis not present

## 2015-11-10 DIAGNOSIS — E46 Unspecified protein-calorie malnutrition: Secondary | ICD-10-CM | POA: Diagnosis not present

## 2015-11-10 DIAGNOSIS — I4891 Unspecified atrial fibrillation: Secondary | ICD-10-CM | POA: Diagnosis not present

## 2015-11-16 DIAGNOSIS — I4891 Unspecified atrial fibrillation: Secondary | ICD-10-CM | POA: Diagnosis not present

## 2015-11-16 DIAGNOSIS — E46 Unspecified protein-calorie malnutrition: Secondary | ICD-10-CM | POA: Diagnosis not present

## 2015-11-16 DIAGNOSIS — I495 Sick sinus syndrome: Secondary | ICD-10-CM | POA: Diagnosis not present

## 2015-11-16 DIAGNOSIS — F0151 Vascular dementia with behavioral disturbance: Secondary | ICD-10-CM | POA: Diagnosis not present

## 2015-11-16 DIAGNOSIS — R634 Abnormal weight loss: Secondary | ICD-10-CM | POA: Diagnosis not present

## 2015-11-16 DIAGNOSIS — R630 Anorexia: Secondary | ICD-10-CM | POA: Diagnosis not present

## 2015-11-22 DIAGNOSIS — E46 Unspecified protein-calorie malnutrition: Secondary | ICD-10-CM | POA: Diagnosis not present

## 2015-11-22 DIAGNOSIS — I495 Sick sinus syndrome: Secondary | ICD-10-CM | POA: Diagnosis not present

## 2015-11-22 DIAGNOSIS — R634 Abnormal weight loss: Secondary | ICD-10-CM | POA: Diagnosis not present

## 2015-11-22 DIAGNOSIS — R630 Anorexia: Secondary | ICD-10-CM | POA: Diagnosis not present

## 2015-11-22 DIAGNOSIS — I4891 Unspecified atrial fibrillation: Secondary | ICD-10-CM | POA: Diagnosis not present

## 2015-11-22 DIAGNOSIS — F0151 Vascular dementia with behavioral disturbance: Secondary | ICD-10-CM | POA: Diagnosis not present

## 2015-11-23 DIAGNOSIS — I4891 Unspecified atrial fibrillation: Secondary | ICD-10-CM | POA: Diagnosis not present

## 2015-11-23 DIAGNOSIS — F0151 Vascular dementia with behavioral disturbance: Secondary | ICD-10-CM | POA: Diagnosis not present

## 2015-11-23 DIAGNOSIS — R630 Anorexia: Secondary | ICD-10-CM | POA: Diagnosis not present

## 2015-11-23 DIAGNOSIS — I495 Sick sinus syndrome: Secondary | ICD-10-CM | POA: Diagnosis not present

## 2015-11-23 DIAGNOSIS — R634 Abnormal weight loss: Secondary | ICD-10-CM | POA: Diagnosis not present

## 2015-11-23 DIAGNOSIS — E46 Unspecified protein-calorie malnutrition: Secondary | ICD-10-CM | POA: Diagnosis not present

## 2015-11-24 DIAGNOSIS — R634 Abnormal weight loss: Secondary | ICD-10-CM | POA: Diagnosis not present

## 2015-11-24 DIAGNOSIS — F0151 Vascular dementia with behavioral disturbance: Secondary | ICD-10-CM | POA: Diagnosis not present

## 2015-11-24 DIAGNOSIS — R630 Anorexia: Secondary | ICD-10-CM | POA: Diagnosis not present

## 2015-11-24 DIAGNOSIS — E46 Unspecified protein-calorie malnutrition: Secondary | ICD-10-CM | POA: Diagnosis not present

## 2015-11-24 DIAGNOSIS — I495 Sick sinus syndrome: Secondary | ICD-10-CM | POA: Diagnosis not present

## 2015-11-24 DIAGNOSIS — I4891 Unspecified atrial fibrillation: Secondary | ICD-10-CM | POA: Diagnosis not present

## 2015-11-25 DIAGNOSIS — F0151 Vascular dementia with behavioral disturbance: Secondary | ICD-10-CM | POA: Diagnosis not present

## 2015-11-25 DIAGNOSIS — E46 Unspecified protein-calorie malnutrition: Secondary | ICD-10-CM | POA: Diagnosis not present

## 2015-11-25 DIAGNOSIS — R634 Abnormal weight loss: Secondary | ICD-10-CM | POA: Diagnosis not present

## 2015-11-25 DIAGNOSIS — I495 Sick sinus syndrome: Secondary | ICD-10-CM | POA: Diagnosis not present

## 2015-11-25 DIAGNOSIS — R630 Anorexia: Secondary | ICD-10-CM | POA: Diagnosis not present

## 2015-11-25 DIAGNOSIS — I4891 Unspecified atrial fibrillation: Secondary | ICD-10-CM | POA: Diagnosis not present

## 2015-11-30 ENCOUNTER — Encounter: Payer: Self-pay | Admitting: Nurse Practitioner

## 2015-11-30 ENCOUNTER — Non-Acute Institutional Stay (SKILLED_NURSING_FACILITY): Payer: Medicare Other | Admitting: Nurse Practitioner

## 2015-11-30 DIAGNOSIS — F0391 Unspecified dementia with behavioral disturbance: Secondary | ICD-10-CM

## 2015-11-30 DIAGNOSIS — F0151 Vascular dementia with behavioral disturbance: Secondary | ICD-10-CM | POA: Diagnosis not present

## 2015-11-30 DIAGNOSIS — R627 Adult failure to thrive: Secondary | ICD-10-CM | POA: Diagnosis not present

## 2015-11-30 DIAGNOSIS — R634 Abnormal weight loss: Secondary | ICD-10-CM | POA: Diagnosis not present

## 2015-11-30 DIAGNOSIS — H3530 Unspecified macular degeneration: Secondary | ICD-10-CM | POA: Diagnosis not present

## 2015-11-30 DIAGNOSIS — E1159 Type 2 diabetes mellitus with other circulatory complications: Secondary | ICD-10-CM | POA: Diagnosis not present

## 2015-11-30 DIAGNOSIS — H540 Blindness, both eyes: Secondary | ICD-10-CM | POA: Diagnosis not present

## 2015-11-30 DIAGNOSIS — K5900 Constipation, unspecified: Secondary | ICD-10-CM

## 2015-11-30 DIAGNOSIS — R630 Anorexia: Secondary | ICD-10-CM | POA: Diagnosis not present

## 2015-11-30 DIAGNOSIS — I4891 Unspecified atrial fibrillation: Secondary | ICD-10-CM | POA: Diagnosis not present

## 2015-11-30 DIAGNOSIS — I495 Sick sinus syndrome: Secondary | ICD-10-CM | POA: Diagnosis not present

## 2015-11-30 DIAGNOSIS — F329 Major depressive disorder, single episode, unspecified: Secondary | ICD-10-CM

## 2015-11-30 DIAGNOSIS — E039 Hypothyroidism, unspecified: Secondary | ICD-10-CM | POA: Diagnosis not present

## 2015-11-30 DIAGNOSIS — I480 Paroxysmal atrial fibrillation: Secondary | ICD-10-CM

## 2015-11-30 DIAGNOSIS — F32A Depression, unspecified: Secondary | ICD-10-CM

## 2015-11-30 DIAGNOSIS — I10 Essential (primary) hypertension: Secondary | ICD-10-CM | POA: Diagnosis not present

## 2015-11-30 DIAGNOSIS — E113419 Type 2 diabetes mellitus with severe nonproliferative diabetic retinopathy with macular edema, unspecified eye: Secondary | ICD-10-CM

## 2015-11-30 DIAGNOSIS — E785 Hyperlipidemia, unspecified: Secondary | ICD-10-CM | POA: Diagnosis not present

## 2015-11-30 DIAGNOSIS — H547 Unspecified visual loss: Secondary | ICD-10-CM

## 2015-11-30 DIAGNOSIS — F339 Major depressive disorder, recurrent, unspecified: Secondary | ICD-10-CM | POA: Diagnosis not present

## 2015-11-30 DIAGNOSIS — E46 Unspecified protein-calorie malnutrition: Secondary | ICD-10-CM | POA: Diagnosis not present

## 2015-11-30 NOTE — Assessment & Plan Note (Signed)
Stable, continue Senna S II nightly.  

## 2015-11-30 NOTE — Assessment & Plan Note (Signed)
Heart rate is in control, off Metoprolol.  

## 2015-11-30 NOTE — Assessment & Plan Note (Signed)
Mood is stable, off Mirtazapine.  

## 2015-11-30 NOTE — Assessment & Plan Note (Signed)
Diet controlled, last Hgb A1c 5.8 01/27/15 

## 2015-11-30 NOTE — Assessment & Plan Note (Signed)
04/03/14 MMSE 22/30.  Comfort measures only, stabilized  

## 2015-11-30 NOTE — Assessment & Plan Note (Signed)
Persists, HOH, low vision, memory deficits, emotional liability, weight loss. Continue supportive care, continue prn Morphine, Atropine sol, Lorazepam for comfort measure

## 2015-11-30 NOTE — Progress Notes (Signed)
Patient ID: [name], female   DOB: November 25, 1914, [age] y.o.   MRN: [id number]  Location:  [facility] Room Number: 105 Place of Service:  SNF (31) Provider: Arna SnipeManXie Fabiana Dromgoole NP  GREEN, Lenon CurtARTHUR G, MD  Patient Care Team: Kimber RelicArthur G Green, MD as PCP - General (Internal Medicine) Kyndall Amero X, NP as Nurse Practitioner (Nurse Practitioner) [facility] M Harle StanfordKimbrough Jr., MD as Consulting Physician (Urology) Rachael Feeaniel P Jacobs, MD as Consulting Physician (Gastroenterology) Salvatore Marvelobert Wainer, MD as Consulting Physician (Orthopedic Surgery) Venancio PoissonLaura Lomax, MD as Consulting Physician (Dermatology) Sheral Apleyimothy D Murphy, MD as Attending Physician (Orthopedic Surgery)  Extended Emergency Contact Information Primary Emergency Contact: Cook,[name] Address: [address]          [place], [place] [address] [place] Home Phone: [phone] Work Phone: [phone] Mobile Phone: [phone] Relation: Son Secondary Emergency Contact: Delair,[name] Address: [address]          [place], [place] [address] [place] Home Phone: [phone] Relation: Relative  Code Status:  DNR Goals of care: Advanced Directive information Advanced Directives 11/30/2015  Does patient have an advance directive? Yes  Type of Estate agentAdvance Directive Healthcare Power of [place] Gardens[place];Out of facility DNR (pink MOST or yellow form)  Does patient want to make changes to advanced directive? No - Patient declined  Copy of advanced directive(s) in chart? Yes     Chief Complaint  Patient presents with  . Medical Management of Chronic Issues    Routine Visit    HPI:  Pt is a 82 y.o. female seen today for medical management of chronic diseases.  FTT. Hx of hypothyroidism, last TSH wnl 12/2014 while on Levothyroxine 12..5mcg. Her blood sugar is controlled on diet, last Hgb A1c 5.8 12/2014. Blood pressure and heart rate are controlled on metoprolol. Her mood is stable on Mirtazapine, dementia requires  SNF [facility] for care needs, her urinary incontinent is chronic and managed with adult depends. She has BM daily. She is still able to feed self at meals.    Past Medical History  Diagnosis Date  . HTN (hypertension)   . Other and unspecified hyperlipidemia   . Hemorrhoids   . AF (atrial fibrillation) (HCC)   . Rectal bleeding   . Sinus node dysfunction (HCC)   . Hip fracture (HCC)   . Bradycardia   . Aortic stenosis   . Mitral valve prolapse   . Hearing loss   . Disturbance of salivary secretion [id number]  . Unspecified hereditary and idiopathic peripheral neuropathy 10/28/202012  . Closed fracture of unspecified trochanteric section of femur 05/29/2011  . Anxiety state, unspecified 08/19/2010  . Diffuse cystic mastopathy 12/23/2009  . Flatulence, eructation, and gas pain 12/03/2009  . Closed fracture of lumbar vertebra without mention of spinal cord injury [id number]  . Personal history of fall 01/29/2009  . Occlusion and stenosis of carotid artery without mention of cerebral infarction 12/27/2007  . Syncope and collapse 12/27/2007  . Other malaise and fatigue 12/06/2007  . Unspecified urinary incontinence 02/22/2005  . Cardiac pacemaker in situ 09/12/2004  . Unspecified hypothyroidism 03/18/2010  . Abnormality of gait 09/08/2003  . Edema 08/18/2003  . Macular degeneration (senile) of retina, unspecified 12/12/2001  . Senile osteoporosis 12/13/2000  . Legal blindness, [place] defined in [place] 03/20/1999    secondary to macular degeneration  . Fracture of greater trochanter of left femur (HCC) 03/08/14  . Atrial fibrillation (HCC) 07/14/2009    Qualifier: Diagnosis of  By: Graciela HusbandsKlein,  MD, Ruthann Cancer Ty Hilts   . Constipation 03/14/2014  . Dementia 2013    04/03/14 MMSE 22/30   . Depression 04/10/2014  . Fall 12/19/2012    07/22/14 fall VS 144/76, 60, 20, 97.1. No apparent injury   . FTT (failure to thrive) in adult 12/15/2014  . Hypothyroidism 12/19/2007    Qualifier: Diagnosis of  By: Melvyn Neth CMA (AAMA), Patty   01/16/14 TSH 2.474 03/24/14 TSH 2.576    . Loss of weight 10/13/2014    Multiple factorials: dementia, depression, advanced age, possible AR of medications(metformin and Oxybutynin)-will increase Mirtazapine and continue supplement. Observe.    . Macular degeneration of both eyes 01/24/2013    Severe visual impairment. Nearly blind.   Margie Ege  medtronic 09/07/2011  . Trochanteric fracture of left femur (HCC) 03/14/2014    CT scan did however reveal acute fracture in greater left trochanteric. Dr. Margarita Rana consulted. Recommended nonoperative management and weightbearing as tolerated and another 3 weeks of Lovenox for DVT prophylaxis.       . Type 2 diabetes mellitus with blindness, with macular edema, with severe nonproliferative retinopathy 12/19/2007    Qualifier: Diagnosis of  By: Melvyn Neth CMA (AAMA), Patty  01/09/14 Hgb A1c 6.5 01/16/14 Hgb A1c 6.5 03/24/14 Hgb 6.4 12/10/14 pharm: dc Metformin and CBG monitoring-failure to thrive and refusing to eat.     . Urinary tract infection, site not specified 04/10/2014  . Xerophthalmus 01/21/2015   Past Surgical History  Procedure Laterality Date  . Pacemaker insertion  2006    Medtronic In-Pulse  . Left hip surgery  09/2005  . Right hip surgery  08/2003  . 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 Tonsillectomy    . Breast biopsy Left 1970    benign    Allergies  Allergen Reactions  . Codeine     unknown  . Ibuprofen     unknown  . Lisinopril     unknown  . Penicillins     unknown  . Sanctura [Trospium Chloride]     unknown      Medication List       This list is accurate as of: 11/30/15 11:59 PM.  Always use your most recent med list.               atropine 1 % ophthalmic solution  Place 4 drops every 2 hours as needed for excess secretions     escitalopram 10 MG tablet  Commonly known as:  LEXAPRO  Take 10 mg by mouth daily.     LORazepam 1 MG tablet    Commonly known as:  ATIVAN  Take 1 mg by mouth every 2 (two) hours as needed for anxiety.     morphine 20 MG/ML concentrated solution  Commonly known as:  ROXANOL  Give 0.26ml (5mg ) by mouth every two hours as needed     sennosides-docusate sodium 8.6-50 MG tablet  Commonly known as:  SENOKOT-S  Take two tablets by mouth at bedtime     TYLENOL 325 MG tablet  Generic drug:  acetaminophen  Take 650 mg by mouth every 4 (four) hours as needed for mild pain or moderate pain.        Review of Systems  Constitutional: Negative for fever and chills.       Emaciated, weight has been stabilized past 3-4 months  HENT: Positive for hearing loss. Negative for congestion, ear discharge and ear pain.  Eyes: Negative for pain, discharge and redness.       Low vision Dry eyes  Respiratory: Positive for cough. Negative for shortness of breath, wheezing and stridor.        Occasional hacking cough, non productive, no O2 desat, afebrile.  Cardiovascular: Negative for chest pain, palpitations and leg swelling.  Gastrointestinal: Negative for nausea, vomiting, abdominal pain and constipation.  Genitourinary: Positive for frequency. Negative for dysuria and urgency.  Musculoskeletal: Negative for myalgias, back pain and neck pain.  Skin: Negative for rash.       Multiple skin tears sustained from fall 06/19/15  Neurological: Negative for dizziness, tremors, seizures, weakness and headaches.  Psychiatric/Behavioral: Negative for suicidal ideas and hallucinations. The patient is nervous/anxious.     Immunization History  Administered Date(s) Administered  . Influenza-Unspecified 06/06/2013, 04/21/2015  . PPD Test 07/23/2011  . Pneumococcal Polysaccharide-23 03/14/2009  . Td 03/14/2009  . Zoster 06/29/2006   Pertinent  Health Maintenance Due  Topic Date Due  . OPHTHALMOLOGY EXAM  07/28/1927  . URINE MICROALBUMIN  07/28/1927  . DEXA SCAN  07/27/1982  . PNA vac Low Risk Adult (2 of 2 -  PCV13) 03/14/2010  . HEMOGLOBIN A1C  07/29/2015  . FOOT EXAM  01/26/2016  . INFLUENZA VACCINE  03/01/2016   Fall Risk  01/26/2015 03/06/2014  Falls in the past year? Yes Yes  Number falls in past yr: 1 1  Injury with Fall? Yes Yes  Risk Factor Category  - High Fall Risk  Risk for fall due to : History of fall(s);Impaired balance/gait;Impaired mobility -  Follow up Falls evaluation completed -   Functional Status Survey:    Filed Vitals:   11/30/15 1127  BP: 108/60  Pulse: 66  Temp: 97 F (36.1 C)  TempSrc: Oral  Resp: 18  Height: 5\' 2"  (1.575 m)  Weight: 96 lb 12.8 oz (43.908 kg)   Body mass index is 17.7 kg/(m^2). Physical Exam  Constitutional: She is oriented to person, place, and time.  Extremely thin  HENT:  Head: Normocephalic and atraumatic.  Right Ear: External ear normal.  Left Ear: External ear normal.  Nose: Nose normal.  Mouth/Throat: Oropharynx is clear and moist. No oropharyngeal exudate.  Eyes: Conjunctivae and EOM are normal. Pupils are equal, round, and reactive to light. Right eye exhibits no discharge. Left eye exhibits no discharge. No scleral icterus.  Dry eyes  Neck: Normal range of motion. Neck supple. No JVD present. No thyromegaly present.  Cardiovascular: Normal rate.   Murmur heard. Pacemaker. Systolic ejection murmur 3/6  Pulmonary/Chest: Effort normal and breath sounds normal. No respiratory distress. She has no wheezes. She has no rales. She exhibits no tenderness.  Abdominal: Soft. Bowel sounds are normal. She exhibits no distension. There is no tenderness. There is no rebound.  Musculoskeletal: Normal range of motion. She exhibits no edema or tenderness.  Lymphadenopathy:    She has no cervical adenopathy.  Neurological: She is alert and oriented to person, place, and time. She has normal reflexes. No cranial nerve deficit. She exhibits normal muscle tone. Coordination normal.  Skin: Skin is warm and dry. No rash noted. She is not  diaphoretic. No erythema. No pallor.  Multiple skin tears above the left eye, back of neck, and left elbow. No s/s of infection.   Psychiatric: Thought content normal. Her mood appears not anxious. Her affect is not angry, not blunt, not labile and not inappropriate. Her speech is not rapid and/or pressured, not delayed and not  slurred. She is slowed. She is not agitated, not aggressive, not hyperactive, not withdrawn, not actively hallucinating and not combative. Thought content is not paranoid and not delusional. Cognition and memory are impaired. She expresses impulsivity and inappropriate judgment. She exhibits a depressed mood. She exhibits abnormal recent memory.  Confusion, HOH, very low vision. Guards against examination. She is attentive.    Labs reviewed:  Recent Labs  01/27/15  NA 141  K 4.3  BUN 11  CREATININE 0.9    Recent Labs  01/27/15  AST 24  ALT 10  ALKPHOS 80    Recent Labs  01/27/15  WBC 10.8  HGB 15.3  HCT 45  PLT 427*   Lab Results  Component Value Date   TSH 3.55 01/27/2015   Lab Results  Component Value Date   HGBA1C 5.8 01/27/2015   Lab Results  Component Value Date   CHOL 140 12/20/2012   HDL 38 12/20/2012   LDLCALC 57 12/20/2012   TRIG 227* 12/20/2012   CHOLHDL 5.0 12/22/2007    Significant Diagnostic Results in last 30 days:  No results found.  Assessment/Plan  ATRIAL FIBRILLATION Heart rate is in control, off Metoprolol.   Constipation Stable, continue Senna S II nightly.    Dementia 04/03/14 MMSE 22/30.  Comfort measures only, stabilized    Depression Mood is stable, off Mirtazapine.    Essential hypertension Controlled, off Metoprolol  daily and Amlodipine  daily.   FTT (failure to thrive) in adult Persists, HOH, low vision, memory deficits, emotional liability, weight loss. Continue supportive care, continue prn Morphine, Atropine sol, Lorazepam for comfort measure  Hypothyroidism Last TSH wnl 3.554  12/2014, off Levothyroxine 12.94mcg   Type 2 diabetes mellitus with blindness, severe nonproliferative retinopathy, and macular edema Diet controlled, last Hgb A1c 5.8 01/27/15     Family/ staff Communication: continue SNF for care needs  Labs/tests ordered:  none

## 2015-11-30 NOTE — Assessment & Plan Note (Signed)
Last TSH wnl 3.554 12/2014, off Levothyroxine 12.5mcg 

## 2015-11-30 NOTE — Assessment & Plan Note (Signed)
Controlled, off Metoprolol 50mg daily and Amlodipine 10mg daily. 

## 2015-12-01 DIAGNOSIS — R630 Anorexia: Secondary | ICD-10-CM | POA: Diagnosis not present

## 2015-12-01 DIAGNOSIS — E46 Unspecified protein-calorie malnutrition: Secondary | ICD-10-CM | POA: Diagnosis not present

## 2015-12-01 DIAGNOSIS — I495 Sick sinus syndrome: Secondary | ICD-10-CM | POA: Diagnosis not present

## 2015-12-01 DIAGNOSIS — R634 Abnormal weight loss: Secondary | ICD-10-CM | POA: Diagnosis not present

## 2015-12-01 DIAGNOSIS — I4891 Unspecified atrial fibrillation: Secondary | ICD-10-CM | POA: Diagnosis not present

## 2015-12-01 DIAGNOSIS — F0151 Vascular dementia with behavioral disturbance: Secondary | ICD-10-CM | POA: Diagnosis not present

## 2015-12-04 DIAGNOSIS — R634 Abnormal weight loss: Secondary | ICD-10-CM | POA: Diagnosis not present

## 2015-12-04 DIAGNOSIS — F0151 Vascular dementia with behavioral disturbance: Secondary | ICD-10-CM | POA: Diagnosis not present

## 2015-12-04 DIAGNOSIS — I495 Sick sinus syndrome: Secondary | ICD-10-CM | POA: Diagnosis not present

## 2015-12-04 DIAGNOSIS — R630 Anorexia: Secondary | ICD-10-CM | POA: Diagnosis not present

## 2015-12-04 DIAGNOSIS — E46 Unspecified protein-calorie malnutrition: Secondary | ICD-10-CM | POA: Diagnosis not present

## 2015-12-04 DIAGNOSIS — I4891 Unspecified atrial fibrillation: Secondary | ICD-10-CM | POA: Diagnosis not present

## 2015-12-07 DIAGNOSIS — F0151 Vascular dementia with behavioral disturbance: Secondary | ICD-10-CM | POA: Diagnosis not present

## 2015-12-07 DIAGNOSIS — E46 Unspecified protein-calorie malnutrition: Secondary | ICD-10-CM | POA: Diagnosis not present

## 2015-12-07 DIAGNOSIS — R634 Abnormal weight loss: Secondary | ICD-10-CM | POA: Diagnosis not present

## 2015-12-07 DIAGNOSIS — I495 Sick sinus syndrome: Secondary | ICD-10-CM | POA: Diagnosis not present

## 2015-12-07 DIAGNOSIS — R630 Anorexia: Secondary | ICD-10-CM | POA: Diagnosis not present

## 2015-12-07 DIAGNOSIS — I4891 Unspecified atrial fibrillation: Secondary | ICD-10-CM | POA: Diagnosis not present

## 2015-12-09 DIAGNOSIS — E46 Unspecified protein-calorie malnutrition: Secondary | ICD-10-CM | POA: Diagnosis not present

## 2015-12-09 DIAGNOSIS — I4891 Unspecified atrial fibrillation: Secondary | ICD-10-CM | POA: Diagnosis not present

## 2015-12-09 DIAGNOSIS — M25559 Pain in unspecified hip: Secondary | ICD-10-CM | POA: Diagnosis not present

## 2015-12-09 DIAGNOSIS — R630 Anorexia: Secondary | ICD-10-CM | POA: Diagnosis not present

## 2015-12-09 DIAGNOSIS — M25569 Pain in unspecified knee: Secondary | ICD-10-CM | POA: Diagnosis not present

## 2015-12-09 DIAGNOSIS — F0151 Vascular dementia with behavioral disturbance: Secondary | ICD-10-CM | POA: Diagnosis not present

## 2015-12-09 DIAGNOSIS — I495 Sick sinus syndrome: Secondary | ICD-10-CM | POA: Diagnosis not present

## 2015-12-09 DIAGNOSIS — R634 Abnormal weight loss: Secondary | ICD-10-CM | POA: Diagnosis not present

## 2015-12-09 DIAGNOSIS — M25579 Pain in unspecified ankle and joints of unspecified foot: Secondary | ICD-10-CM | POA: Diagnosis not present

## 2015-12-11 ENCOUNTER — Encounter: Payer: Self-pay | Admitting: Nurse Practitioner

## 2015-12-11 ENCOUNTER — Non-Acute Institutional Stay (SKILLED_NURSING_FACILITY): Payer: Medicare Other | Admitting: Nurse Practitioner

## 2015-12-11 DIAGNOSIS — R627 Adult failure to thrive: Secondary | ICD-10-CM | POA: Diagnosis not present

## 2015-12-11 DIAGNOSIS — I10 Essential (primary) hypertension: Secondary | ICD-10-CM | POA: Diagnosis not present

## 2015-12-11 DIAGNOSIS — I4891 Unspecified atrial fibrillation: Secondary | ICD-10-CM | POA: Diagnosis not present

## 2015-12-11 DIAGNOSIS — R630 Anorexia: Secondary | ICD-10-CM | POA: Diagnosis not present

## 2015-12-11 DIAGNOSIS — K5900 Constipation, unspecified: Secondary | ICD-10-CM

## 2015-12-11 DIAGNOSIS — E039 Hypothyroidism, unspecified: Secondary | ICD-10-CM | POA: Diagnosis not present

## 2015-12-11 DIAGNOSIS — F0390 Unspecified dementia without behavioral disturbance: Secondary | ICD-10-CM

## 2015-12-11 DIAGNOSIS — R634 Abnormal weight loss: Secondary | ICD-10-CM | POA: Diagnosis not present

## 2015-12-11 DIAGNOSIS — E46 Unspecified protein-calorie malnutrition: Secondary | ICD-10-CM | POA: Diagnosis not present

## 2015-12-11 DIAGNOSIS — F329 Major depressive disorder, single episode, unspecified: Secondary | ICD-10-CM

## 2015-12-11 DIAGNOSIS — S32512A Fracture of superior rim of left pubis, initial encounter for closed fracture: Secondary | ICD-10-CM | POA: Diagnosis not present

## 2015-12-11 DIAGNOSIS — I495 Sick sinus syndrome: Secondary | ICD-10-CM | POA: Diagnosis not present

## 2015-12-11 DIAGNOSIS — F32A Depression, unspecified: Secondary | ICD-10-CM

## 2015-12-11 DIAGNOSIS — I480 Paroxysmal atrial fibrillation: Secondary | ICD-10-CM

## 2015-12-11 DIAGNOSIS — F0151 Vascular dementia with behavioral disturbance: Secondary | ICD-10-CM | POA: Diagnosis not present

## 2015-12-11 NOTE — Assessment & Plan Note (Signed)
04/03/14 MMSE 22/30.  Comfort measures only, stabilized  

## 2015-12-11 NOTE — Assessment & Plan Note (Signed)
X-ray LLE 12/09/15 left superior pubic ramus at the acetabulum hairline extension into the hip joint.  Pain management, activity as tolerated, goal of care is comfort measures.

## 2015-12-11 NOTE — Assessment & Plan Note (Signed)
Heart rate is in control, off Metoprolol.  

## 2015-12-11 NOTE — Assessment & Plan Note (Signed)
Persists, HOH, low vision, memory deficits, emotional liability, weight loss. Continue supportive care, continue prn Morphine, Atropine sol, Lorazepam for comfort measure 

## 2015-12-11 NOTE — Assessment & Plan Note (Signed)
Last TSH wnl 3.554 12/2014, off Levothyroxine 12.5mcg 

## 2015-12-11 NOTE — Assessment & Plan Note (Signed)
Stable, continue Senna S II nightly.  

## 2015-12-11 NOTE — Assessment & Plan Note (Signed)
Mood is stable, off Mirtazapine.  

## 2015-12-11 NOTE — Assessment & Plan Note (Signed)
Controlled, off Metoprolol 50mg daily and Amlodipine 10mg daily. 

## 2015-12-11 NOTE — Progress Notes (Signed)
Patient ID: [name]. [name], female   DOB: 01-Sep-1914, 82 y.o.   MRN: [id number]  Location:  [facility] Room Number: 105 Place of Service:  SNF (31) Provider: Arna Snipe Baruc Tugwell NP  GREEN, Lenon Curt, MD  Patient Care Team: Kimber Relic, MD as PCP - General (Internal Medicine) Anjuli Gemmill X, NP as Nurse Practitioner (Nurse Practitioner) [facility] Harle Stanford., MD as Consulting Physician (Urology) Rachael Fee, MD as Consulting Physician (Gastroenterology) Salvatore Marvel, MD as Consulting Physician (Orthopedic Surgery) Venancio Poisson, MD as Consulting Physician (Dermatology) Sheral Apley, MD as Attending Physician (Orthopedic Surgery)  Extended Emergency Contact Information Primary Emergency Contact: [name],[name] Address: [address]          [place], [place] [address] [place] Home Phone: [phone] Work Phone: [phone] Mobile Phone: [phone] Relation: Son Secondary Emergency Contact: [name],[name] Address: [address]          [place], [place] [address] [place] Home Phone: [phone] Relation: Relative  Code Status:  DNR Goals of care: Advanced Directive information Advanced Directives 12/11/2015  Does patient have an advance directive? Yes  Type of Estate agent of [place];Out of facility DNR (pink MOST or yellow form)  Does patient want to make changes to advanced directive? No - Patient declined  Copy of advanced directive(s) in chart? Yes     Chief Complaint  Patient presents with  . Acute Visit    left hip fracture    HPI:  Pt is a 82 y.o. female seen today for medical management of chronic diseases.  FTT. Hx of hypothyroidism, last TSH wnl 12/2014 while on Levothyroxine 12.. . Her blood sugar is controlled on diet, last Hgb A1c 5.8 12/2014. Blood pressure and heart rate are controlled on metoprolol. Her mood is stable on Mirtazapine, dementia requires SNF [facility] for care  needs, her urinary incontinent is chronic and managed with adult depends. She has BM daily. She is still able to feed self at meals.    X-ray LLE 12/09/15 left superior pubic ramus at the acetabulum hairline extension into the hip joint.   Past Medical History  Diagnosis Date  . HTN (hypertension)   . Other and unspecified hyperlipidemia   . Hemorrhoids   . AF (atrial fibrillation) (HCC)   . Rectal bleeding   . Sinus node dysfunction (HCC)   . Hip fracture (HCC)   . Bradycardia   . Aortic stenosis   . Mitral valve prolapse   . Hearing loss   . Disturbance of salivary secretion [id number]  . Unspecified hereditary and idiopathic peripheral neuropathy 10/28/202012  . Closed fracture of unspecified trochanteric section of femur 05/29/2011  . Anxiety state, unspecified 08/19/2010  . Diffuse cystic mastopathy 12/23/2009  . Flatulence, eructation, and gas pain 12/03/2009  . Closed fracture of lumbar vertebra without mention of spinal cord injury 12020/05/909  . Personal history of fall 01/29/2009  . Occlusion and stenosis of carotid artery without mention of cerebral infarction 12/27/2007  . Syncope and collapse 12/27/2007  . Other malaise and fatigue 12/06/2007  . Unspecified urinary incontinence 02/22/2005  . Cardiac pacemaker in situ 09/12/2004  . Unspecified hypothyroidism 03/18/2010  . Abnormality of gait 09/08/2003  . Edema 08/18/2003  . Macular degeneration (senile) of retina, unspecified 12/12/2001  . Senile osteoporosis 12/13/2000  . Legal blindness, as defined in [place] 03/20/1999    secondary to macular degeneration  . Fracture of greater trochanter of left femur (HCC)  03/08/14  . Atrial fibrillation (HCC) 07/14/2009    Qualifier: Diagnosis of  By: [name], MD, Ruthann Cancer Ty Hilts   . Constipation 03/14/2014  . Dementia 2013    04/03/14 MMSE 22/30   . Depression 04/10/2014  . Fall 12/19/2012    07/22/14 fall VS 144/76, 60, 20, 97.1. No apparent injury   . FTT (failure to thrive) in adult 12/15/2014    . Hypothyroidism 12/19/2007    Qualifier: Diagnosis of  By: Melvyn Neth CMA (AAMA), Patty  01/16/14 TSH 2.474 03/24/14 TSH 2.576    . Loss of weight 10/13/2014    Multiple factorials: dementia, depression, advanced age, possible AR of medications(metformin and Oxybutynin)-will increase Mirtazapine and continue supplement. Observe.    . Macular degeneration of both eyes 01/24/2013    Severe visual impairment. Nearly blind.   Margie Ege  medtronic 09/07/2011  . Trochanteric fracture of left femur (HCC) 03/14/2014    CT scan did however reveal acute fracture in greater left trochanteric. Dr. Margarita Rana consulted. Recommended nonoperative management and weightbearing as tolerated and another 3 weeks of Lovenox for DVT prophylaxis.       . Type 2 diabetes mellitus with blindness, with macular edema, with severe nonproliferative retinopathy 12/19/2007    Qualifier: Diagnosis of  By: Melvyn Neth CMA (AAMA), Patty  01/09/14 Hgb A1c 6.5 01/16/14 Hgb A1c 6.5 03/24/14 Hgb 6.4 12/10/14 pharm: dc Metformin and CBG monitoring-failure to thrive and refusing to eat.     . Urinary tract infection, site not specified 04/10/2014  . Xerophthalmus 01/21/2015   Past Surgical History  Procedure Laterality Date  . Pacemaker insertion  2006    Medtronic In-Pulse  . Left hip surgery  09/2005  . Right hip surgery  08/2003  . 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 Tonsillectomy    . Breast biopsy Left 1970    benign    Allergies  Allergen Reactions  . Codeine     unknown  . Ibuprofen     unknown  . Lisinopril     unknown  . Penicillins     unknown  . Sanctura [Trospium Chloride]     unknown      Medication List       This list is accurate as of: 12/11/15  1:08 PM.  Always use your most recent med list.               atropine 1 % ophthalmic solution  Place 4 drops every 2 hours as needed for excess secretions     escitalopram 10 MG tablet   Commonly known as:  LEXAPRO  Take 10 mg by mouth daily.     LORazepam 1 MG tablet  Commonly known as:  ATIVAN  Take 1 mg by mouth every 2 (two) hours as needed for anxiety.     morphine 20 MG/ML concentrated solution  Commonly known as:  ROXANOL  Give 0.61ml (5mg ) by mouth every two hours as needed     sennosides-docusate sodium 8.6-50 MG tablet  Commonly known as:  SENOKOT-S  Reported on 12/11/2015     TYLENOL 325 MG tablet  Generic drug:  acetaminophen  Take 650 mg by mouth every 4 (four) hours as needed for mild pain or moderate pain.        Review of Systems  Constitutional: Negative for fever and chills.       Emaciated, weight has been stabilized past 3-4 months  HENT: Positive for hearing loss. Negative for congestion, ear discharge and ear pain.   Eyes: Negative for pain, discharge and redness.       Low vision Dry eyes  Respiratory: Positive for cough. Negative for shortness of breath, wheezing and stridor.        Occasional hacking cough, non productive, no O2 desat, afebrile.  Cardiovascular: Negative for chest pain, palpitations and leg swelling.  Gastrointestinal: Negative for nausea, vomiting, abdominal pain and constipation.  Genitourinary: Positive for frequency. Negative for dysuria and urgency.  Musculoskeletal: Positive for arthralgias and gait problem. Negative for myalgias, back pain and neck pain.       Left hip/leg pain.   Skin: Negative for rash.       Multiple skin tears sustained from fall 06/19/15  Neurological: Negative for dizziness, tremors, seizures, weakness and headaches.  Psychiatric/Behavioral: Negative for suicidal ideas and hallucinations. The patient is nervous/anxious.     Immunization History  Administered Date(s) Administered  . Influenza-Unspecified 06/06/2013, 04/21/2015  . PPD Test 07/23/2011  . Pneumococcal Polysaccharide-23 03/14/2009  . Td 03/14/2009  . Zoster 06/29/2006   Pertinent  Health Maintenance Due  Topic Date  Due  . OPHTHALMOLOGY EXAM  07/28/1927  . URINE MICROALBUMIN  07/28/1927  . DEXA SCAN  07/27/1982  . PNA vac Low Risk Adult (2 of 2 - PCV13) 03/14/2010  . HEMOGLOBIN A1C  07/29/2015  . FOOT EXAM  01/26/2016  . INFLUENZA VACCINE  03/01/2016   Fall Risk  01/26/2015 03/06/2014  Falls in the past year? Yes Yes  Number falls in past yr: 1 1  Injury with Fall? Yes Yes  Risk Factor Category  - High Fall Risk  Risk for fall due to : History of fall(s);Impaired balance/gait;Impaired mobility -  Follow up Falls evaluation completed -   Functional Status Survey:    Filed Vitals:   12/11/15 1048  BP: 144/72  Pulse: 68  Temp: 97.2 F (36.2 C)  TempSrc: Oral  Resp: 20  Height: 5\' 2"  (1.575 m)  Weight: 101 lb 6.4 oz (45.995 kg)   Body mass index is 18.54 kg/(m^2). Physical Exam  Constitutional: She is oriented to person, place, and time.  Extremely thin  HENT:  Head: Normocephalic and atraumatic.  Right Ear: External ear normal.  Left Ear: External ear normal.  Nose: Nose normal.  Mouth/Throat: Oropharynx is clear and moist. No oropharyngeal exudate.  Eyes: Conjunctivae and EOM are normal. Pupils are equal, round, and reactive to light. Right eye exhibits no discharge. Left eye exhibits no discharge. No scleral icterus.  Dry eyes  Neck: Normal range of motion. Neck supple. No JVD present. No thyromegaly present.  Cardiovascular: Normal rate.   Murmur heard. Pacemaker. Systolic ejection murmur 3/6  Pulmonary/Chest: Effort normal and breath sounds normal. No respiratory distress. She has no wheezes. She has no rales. She exhibits no tenderness.  Abdominal: Soft. Bowel sounds are normal. She exhibits no distension. There is no tenderness. There is no rebound.  Musculoskeletal: Normal range of motion. She exhibits tenderness. She exhibits no edema.  Left hip and leg pain  Lymphadenopathy:    She has no cervical adenopathy.  Neurological: She is alert and oriented to person, place, and  time. She has normal reflexes. No cranial nerve deficit. She exhibits normal muscle tone. Coordination normal.  Skin: Skin is warm and dry. No rash noted. She is not diaphoretic. No erythema. No pallor.  Multiple skin tears above the left eye, back of neck, and left elbow.  No s/s of infection.   Psychiatric: Thought content normal. Her mood appears not anxious. Her affect is not angry, not blunt, not labile and not inappropriate. Her speech is not rapid and/or pressured, not delayed and not slurred. She is slowed. She is not agitated, not aggressive, not hyperactive, not withdrawn, not actively hallucinating and not combative. Thought content is not paranoid and not delusional. Cognition and memory are impaired. She expresses impulsivity and inappropriate judgment. She exhibits a depressed mood. She exhibits abnormal recent memory.  Confusion, HOH, very low vision. Guards against examination. She is attentive.    Labs reviewed:  Recent Labs  01/27/15  NA 141  K 4.3  BUN 11  CREATININE 0.9    Recent Labs  01/27/15  AST 24  ALT 10  ALKPHOS 80    Recent Labs  01/27/15  WBC 10.8  HGB 15.3  HCT 45  PLT 427*   Lab Results  Component Value Date   TSH 3.55 01/27/2015   Lab Results  Component Value Date   HGBA1C 5.8 01/27/2015   Lab Results  Component Value Date   CHOL 140 12/20/2012   HDL 38 12/20/2012   LDLCALC 57 12/20/2012   TRIG 227* 12/20/2012   CHOLHDL 5.0 12/22/2007    Significant Diagnostic Results in last 30 days:  No results found.  Assessment/Plan  Closed fracture of left superior pubic ramus (HCC) X-ray LLE 12/09/15 left superior pubic ramus at the acetabulum hairline extension into the hip joint.  Pain management, activity as tolerated, goal of care is comfort measures.   ATRIAL FIBRILLATION Heart rate is in control, off Metoprolol.   Constipation Stable, continue Senna S II nightly.    Dementia 04/03/14 MMSE 22/30.  Comfort measures only,  stabilized    Depression Mood is stable, off Mirtazapine.     Essential hypertension Controlled, off Metoprolol 50mg  daily and Amlodipine 10mg  daily.    FTT (failure to thrive) in adult Persists, HOH, low vision, memory deficits, emotional liability, weight loss. Continue supportive care, continue prn Morphine, Atropine sol, Lorazepam for comfort measure   Hypothyroidism Last TSH wnl 3.554 12/2014, off Levothyroxine 12.12mcg      Family/ staff Communication: continue SNF for care needs  Labs/tests ordered:  X-ray LLE done 12/09/15

## 2015-12-16 DIAGNOSIS — R634 Abnormal weight loss: Secondary | ICD-10-CM | POA: Diagnosis not present

## 2015-12-16 DIAGNOSIS — F0151 Vascular dementia with behavioral disturbance: Secondary | ICD-10-CM | POA: Diagnosis not present

## 2015-12-16 DIAGNOSIS — E46 Unspecified protein-calorie malnutrition: Secondary | ICD-10-CM | POA: Diagnosis not present

## 2015-12-16 DIAGNOSIS — I495 Sick sinus syndrome: Secondary | ICD-10-CM | POA: Diagnosis not present

## 2015-12-16 DIAGNOSIS — R630 Anorexia: Secondary | ICD-10-CM | POA: Diagnosis not present

## 2015-12-16 DIAGNOSIS — I4891 Unspecified atrial fibrillation: Secondary | ICD-10-CM | POA: Diagnosis not present

## 2015-12-17 DIAGNOSIS — E46 Unspecified protein-calorie malnutrition: Secondary | ICD-10-CM | POA: Diagnosis not present

## 2015-12-17 DIAGNOSIS — I495 Sick sinus syndrome: Secondary | ICD-10-CM | POA: Diagnosis not present

## 2015-12-17 DIAGNOSIS — R634 Abnormal weight loss: Secondary | ICD-10-CM | POA: Diagnosis not present

## 2015-12-17 DIAGNOSIS — I4891 Unspecified atrial fibrillation: Secondary | ICD-10-CM | POA: Diagnosis not present

## 2015-12-17 DIAGNOSIS — F0151 Vascular dementia with behavioral disturbance: Secondary | ICD-10-CM | POA: Diagnosis not present

## 2015-12-17 DIAGNOSIS — R630 Anorexia: Secondary | ICD-10-CM | POA: Diagnosis not present

## 2015-12-18 DIAGNOSIS — I4891 Unspecified atrial fibrillation: Secondary | ICD-10-CM | POA: Diagnosis not present

## 2015-12-18 DIAGNOSIS — F0151 Vascular dementia with behavioral disturbance: Secondary | ICD-10-CM | POA: Diagnosis not present

## 2015-12-18 DIAGNOSIS — E46 Unspecified protein-calorie malnutrition: Secondary | ICD-10-CM | POA: Diagnosis not present

## 2015-12-18 DIAGNOSIS — R630 Anorexia: Secondary | ICD-10-CM | POA: Diagnosis not present

## 2015-12-18 DIAGNOSIS — I495 Sick sinus syndrome: Secondary | ICD-10-CM | POA: Diagnosis not present

## 2015-12-18 DIAGNOSIS — R634 Abnormal weight loss: Secondary | ICD-10-CM | POA: Diagnosis not present

## 2015-12-22 DIAGNOSIS — E46 Unspecified protein-calorie malnutrition: Secondary | ICD-10-CM | POA: Diagnosis not present

## 2015-12-22 DIAGNOSIS — I495 Sick sinus syndrome: Secondary | ICD-10-CM | POA: Diagnosis not present

## 2015-12-22 DIAGNOSIS — R634 Abnormal weight loss: Secondary | ICD-10-CM | POA: Diagnosis not present

## 2015-12-22 DIAGNOSIS — R630 Anorexia: Secondary | ICD-10-CM | POA: Diagnosis not present

## 2015-12-22 DIAGNOSIS — I4891 Unspecified atrial fibrillation: Secondary | ICD-10-CM | POA: Diagnosis not present

## 2015-12-22 DIAGNOSIS — F0151 Vascular dementia with behavioral disturbance: Secondary | ICD-10-CM | POA: Diagnosis not present

## 2015-12-24 DIAGNOSIS — F0151 Vascular dementia with behavioral disturbance: Secondary | ICD-10-CM | POA: Diagnosis not present

## 2015-12-24 DIAGNOSIS — R630 Anorexia: Secondary | ICD-10-CM | POA: Diagnosis not present

## 2015-12-24 DIAGNOSIS — I495 Sick sinus syndrome: Secondary | ICD-10-CM | POA: Diagnosis not present

## 2015-12-24 DIAGNOSIS — E46 Unspecified protein-calorie malnutrition: Secondary | ICD-10-CM | POA: Diagnosis not present

## 2015-12-24 DIAGNOSIS — I4891 Unspecified atrial fibrillation: Secondary | ICD-10-CM | POA: Diagnosis not present

## 2015-12-24 DIAGNOSIS — R634 Abnormal weight loss: Secondary | ICD-10-CM | POA: Diagnosis not present

## 2015-12-31 DIAGNOSIS — R634 Abnormal weight loss: Secondary | ICD-10-CM | POA: Diagnosis not present

## 2015-12-31 DIAGNOSIS — E1159 Type 2 diabetes mellitus with other circulatory complications: Secondary | ICD-10-CM | POA: Diagnosis not present

## 2015-12-31 DIAGNOSIS — F0151 Vascular dementia with behavioral disturbance: Secondary | ICD-10-CM | POA: Diagnosis not present

## 2015-12-31 DIAGNOSIS — F339 Major depressive disorder, recurrent, unspecified: Secondary | ICD-10-CM | POA: Diagnosis not present

## 2015-12-31 DIAGNOSIS — E785 Hyperlipidemia, unspecified: Secondary | ICD-10-CM | POA: Diagnosis not present

## 2015-12-31 DIAGNOSIS — R630 Anorexia: Secondary | ICD-10-CM | POA: Diagnosis not present

## 2015-12-31 DIAGNOSIS — H3530 Unspecified macular degeneration: Secondary | ICD-10-CM | POA: Diagnosis not present

## 2015-12-31 DIAGNOSIS — I10 Essential (primary) hypertension: Secondary | ICD-10-CM | POA: Diagnosis not present

## 2015-12-31 DIAGNOSIS — I495 Sick sinus syndrome: Secondary | ICD-10-CM | POA: Diagnosis not present

## 2015-12-31 DIAGNOSIS — I4891 Unspecified atrial fibrillation: Secondary | ICD-10-CM | POA: Diagnosis not present

## 2015-12-31 DIAGNOSIS — E46 Unspecified protein-calorie malnutrition: Secondary | ICD-10-CM | POA: Diagnosis not present

## 2015-12-31 DIAGNOSIS — M84359S Stress fracture, hip, unspecified, sequela: Secondary | ICD-10-CM | POA: Diagnosis not present

## 2015-12-31 DIAGNOSIS — E039 Hypothyroidism, unspecified: Secondary | ICD-10-CM | POA: Diagnosis not present

## 2016-01-01 ENCOUNTER — Non-Acute Institutional Stay (SKILLED_NURSING_FACILITY): Payer: Medicare Other | Admitting: Nurse Practitioner

## 2016-01-01 ENCOUNTER — Encounter: Payer: Self-pay | Admitting: Nurse Practitioner

## 2016-01-01 DIAGNOSIS — K5900 Constipation, unspecified: Secondary | ICD-10-CM | POA: Diagnosis not present

## 2016-01-01 DIAGNOSIS — F32A Depression, unspecified: Secondary | ICD-10-CM

## 2016-01-01 DIAGNOSIS — E039 Hypothyroidism, unspecified: Secondary | ICD-10-CM

## 2016-01-01 DIAGNOSIS — I495 Sick sinus syndrome: Secondary | ICD-10-CM | POA: Diagnosis not present

## 2016-01-01 DIAGNOSIS — F0390 Unspecified dementia without behavioral disturbance: Secondary | ICD-10-CM | POA: Diagnosis not present

## 2016-01-01 DIAGNOSIS — F329 Major depressive disorder, single episode, unspecified: Secondary | ICD-10-CM

## 2016-01-01 DIAGNOSIS — R634 Abnormal weight loss: Secondary | ICD-10-CM | POA: Diagnosis not present

## 2016-01-01 DIAGNOSIS — I10 Essential (primary) hypertension: Secondary | ICD-10-CM

## 2016-01-01 DIAGNOSIS — E46 Unspecified protein-calorie malnutrition: Secondary | ICD-10-CM | POA: Diagnosis not present

## 2016-01-01 DIAGNOSIS — L03116 Cellulitis of left lower limb: Secondary | ICD-10-CM | POA: Diagnosis not present

## 2016-01-01 DIAGNOSIS — I4891 Unspecified atrial fibrillation: Secondary | ICD-10-CM | POA: Diagnosis not present

## 2016-01-01 DIAGNOSIS — S32512S Fracture of superior rim of left pubis, sequela: Secondary | ICD-10-CM

## 2016-01-01 DIAGNOSIS — I480 Paroxysmal atrial fibrillation: Secondary | ICD-10-CM

## 2016-01-01 DIAGNOSIS — R630 Anorexia: Secondary | ICD-10-CM | POA: Diagnosis not present

## 2016-01-01 DIAGNOSIS — F0151 Vascular dementia with behavioral disturbance: Secondary | ICD-10-CM | POA: Diagnosis not present

## 2016-01-01 NOTE — Assessment & Plan Note (Signed)
Heart rate is in control, off Metoprolol.  

## 2016-01-01 NOTE — Assessment & Plan Note (Signed)
X-ray LLE 12/09/15 left superior pubic ramus at the acetabulum hairline extension into the hip joint.  Pain management, activity as tolerated, goal of care is comfort measures.

## 2016-01-01 NOTE — Progress Notes (Signed)
Patient ID: [name]. [name], female   DOB: 04-12-1915, 82 y.o.   MRN: [id number]  Location:  [facility] Room Number: 105 Place of Service:  SNF (31) Provider: Arna Snipe Kellin Fifer NP  GREEN, Lenon Curt, MD  Patient Care Team: Kimber Relic, MD as PCP - General (Internal Medicine) Celeste Candelas X, NP as Nurse Practitioner (Nurse Practitioner) [facility] Harle Stanford., MD as Consulting Physician (Urology) Rachael Fee, MD as Consulting Physician (Gastroenterology) Salvatore Marvel, MD as Consulting Physician (Orthopedic Surgery) Venancio Poisson, MD as Consulting Physician (Dermatology) Sheral Apley, MD as Attending Physician (Orthopedic Surgery)  Extended Emergency Contact Information Primary Emergency Contact: Cook,[name] Address: [address]          [place], [place] [address] [place] Home Phone: [phone] Work Phone: [phone] Mobile Phone: [phone] Relation: Son Secondary Emergency Contact: Fitzhenry,[name] Address: [address]          [place], [place] [address] [place] Home Phone: [phone] Relation: Relative  Code Status:  DNR Goals of care: Advanced Directive information Advanced Directives 01/01/2016  Does patient have an advance directive? Yes  Type of Estate agent of [place];Out of facility DNR (pink MOST or yellow form)  Does patient want to make changes to advanced directive? No - Patient declined  Copy of advanced directive(s) in chart? Yes     Chief Complaint  Patient presents with  . Acute Visit    has 4 in skin tear on left shin, redness    HPI:  Pt is a 82 y.o. female seen today for medical management of chronic diseases.  FTT. Hx of hypothyroidism, last TSH wnl 12/2014 while on Levothyroxine 12.. . Her blood sugar is controlled on diet, last Hgb A1c 5.8 12/2014. Blood pressure and heart rate are controlled on metoprolol. Her mood is stable on Mirtazapine, dementia  requires SNF [facility] for care needs, her urinary incontinent is chronic and managed with adult depends. She has BM daily. She is still able to feed self at meals.    X-ray LLE 12/09/15 left superior pubic ramus at the acetabulum hairline extension into the hip joint.   Past Medical History  Diagnosis Date  . HTN (hypertension)   . Other and unspecified hyperlipidemia   . Hemorrhoids   . AF (atrial fibrillation) (HCC)   . Rectal bleeding   . Sinus node dysfunction (HCC)   . Hip fracture (HCC)   . Bradycardia   . Aortic stenosis   . Mitral valve prolapse   . Hearing loss   . Disturbance of salivary secretion [id number]  . Unspecified hereditary and idiopathic peripheral neuropathy 10/28/202012  . Closed fracture of unspecified trochanteric section of femur 05/29/2011  . Anxiety state, unspecified 08/19/2010  . Diffuse cystic mastopathy 12/23/2009  . Flatulence, eructation, and gas pain 12/03/2009  . Closed fracture of lumbar vertebra without mention of spinal cord injury [id number]-[id number]  . Personal history of fall 01/29/2009  . Occlusion and stenosis of carotid artery without mention of cerebral infarction 12/27/2007  . Syncope and collapse 12/27/2007  . Other malaise and fatigue 12/06/2007  . Unspecified urinary incontinence 02/22/2005  . Cardiac pacemaker in situ 09/12/2004  . Unspecified hypothyroidism 03/18/2010  . Abnormality of gait 09/08/2003  . Edema 08/18/2003  . Macular degeneration (senile) of retina, unspecified 12/12/2001  . Senile osteoporosis 12/13/2000  . Legal blindness, as defined in [place] 03/20/1999    secondary to macular degeneration  . Fracture of  greater trochanter of left femur (HCC) 03/08/14  . Atrial fibrillation (HCC) 07/14/2009    Qualifier: Diagnosis of  By: [name], MD, Ruthann Cancer Ty Hilts   . Constipation 03/14/2014  . Dementia 2013    04/03/14 MMSE 22/30   . Depression 04/10/2014  . Fall 12/19/2012    07/22/14 fall VS 144/76, 60, 20, 97.1. No apparent injury   . FTT (failure to  thrive) in adult 12/15/2014  . Hypothyroidism 12/19/2007    Qualifier: Diagnosis of  By: Melvyn Neth CMA (AAMA), Patty  01/16/14 TSH 2.474 03/24/14 TSH 2.576    . Loss of weight 10/13/2014    Multiple factorials: dementia, depression, advanced age, possible AR of medications(metformin and Oxybutynin)-will increase Mirtazapine and continue supplement. Observe.    . Macular degeneration of both eyes 01/24/2013    Severe visual impairment. Nearly blind.   Margie Ege  medtronic 09/07/2011  . Trochanteric fracture of left femur (HCC) 03/14/2014    CT scan did however reveal acute fracture in greater left trochanteric. Dr. Margarita Rana consulted. Recommended nonoperative management and weightbearing as tolerated and another 3 weeks of Lovenox for DVT prophylaxis.       . Type 2 diabetes mellitus with blindness, with macular edema, with severe nonproliferative retinopathy 12/19/2007    Qualifier: Diagnosis of  By: Melvyn Neth CMA (AAMA), Patty  01/09/14 Hgb A1c 6.5 01/16/14 Hgb A1c 6.5 03/24/14 Hgb 6.4 12/10/14 pharm: dc Metformin and CBG monitoring-failure to thrive and refusing to eat.     . Urinary tract infection, site not specified 04/10/2014  . Xerophthalmus 01/21/2015   Past Surgical History  Procedure Laterality Date  . Pacemaker insertion  2006    Medtronic In-Pulse  . Left hip surgery  09/2005  . Right hip surgery  08/2003  . 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 Tonsillectomy    . Breast biopsy Left 1970    benign    Allergies  Allergen Reactions  . Codeine     unknown  . Ibuprofen     unknown  . Lisinopril     unknown  . Penicillins     unknown  . Sanctura [Trospium Chloride]     unknown      Medication List       This list is accurate as of: 01/01/16  2:50 PM.  Always use your most recent med list.               atropine 1 % ophthalmic solution  Place 4 drops every 2 hours as needed for excess secretions      bisacodyl 10 MG suppository  Commonly known as:  DULCOLAX  Place 10 mg rectally as needed for moderate constipation.     escitalopram 10 MG tablet  Commonly known as:  LEXAPRO  Take 10 mg by mouth daily.     LORazepam 1 MG tablet  Commonly known as:  ATIVAN  Take 1 mg by mouth every 2 (two) hours as needed for anxiety.     morphine 20 MG/ML concentrated solution  Commonly known as:  ROXANOL  Give 0.42ml ( ) by mouth every two hours as needed     sennosides-docusate sodium 8.6-50 MG tablet  Commonly known as:  SENOKOT-S  Take 2 tablets by mouth at bedtime. Reported on 12/11/2015     TYLENOL 325 MG tablet  Generic drug:  acetaminophen  Take 650 mg by mouth every 4 (four) hours as needed for mild  pain or moderate pain.        Review of Systems  Constitutional: Negative for fever and chills.       Emaciated, weight has been stabilized past 3-4 months  HENT: Positive for hearing loss. Negative for congestion, ear discharge and ear pain.   Eyes: Negative for pain, discharge and redness.       Low vision Dry eyes  Respiratory: Positive for cough. Negative for shortness of breath, wheezing and stridor.        Occasional hacking cough, non productive, no O2 desat, afebrile.  Cardiovascular: Negative for chest pain, palpitations and leg swelling.  Gastrointestinal: Negative for nausea, vomiting, abdominal pain and constipation.  Genitourinary: Positive for frequency. Negative for dysuria and urgency.  Musculoskeletal: Positive for arthralgias and gait problem. Negative for myalgias, back pain and neck pain.       Left hip/leg pain.   Skin: Negative for rash.       Left shin open wound 4 in, redness.   Neurological: Negative for dizziness, tremors, seizures, weakness and headaches.  Psychiatric/Behavioral: Negative for suicidal ideas and hallucinations. The patient is nervous/anxious.     Immunization History  Administered Date(s) Administered  . Influenza-Unspecified  06/06/2013, 04/21/2015  . PPD Test 07/23/2011  . Pneumococcal Polysaccharide-23 03/14/2009  . Td 03/14/2009  . Zoster 06/29/2006   Pertinent  Health Maintenance Due  Topic Date Due  . OPHTHALMOLOGY EXAM  07/28/1927  . URINE MICROALBUMIN  07/28/1927  . DEXA SCAN  07/27/1982  . PNA vac Low Risk Adult (2 of 2 - PCV13) 03/14/2010  . HEMOGLOBIN A1C  07/29/2015  . FOOT EXAM  01/26/2016  . INFLUENZA VACCINE  03/01/2016   Fall Risk  01/26/2015 03/06/2014  Falls in the past year? Yes Yes  Number falls in past yr: 1 1  Injury with Fall? Yes Yes  Risk Factor Category  - High Fall Risk  Risk for fall due to : History of fall(s);Impaired balance/gait;Impaired mobility -  Follow up Falls evaluation completed -   Functional Status Survey:    Filed Vitals:   01/01/16 1013  BP: 120/74  Pulse: 72  Temp: 97.4 F (36.3 C)  TempSrc: Oral  Resp: 20  Height: 5\' 2"  (1.575 m)  Weight: 101 lb 6.4 oz (45.995 kg)   Body mass index is 18.54 kg/(m^2). Physical Exam  Constitutional: She is oriented to person, place, and time.  Extremely thin  HENT:  Head: Normocephalic and atraumatic.  Right Ear: External ear normal.  Left Ear: External ear normal.  Nose: Nose normal.  Mouth/Throat: Oropharynx is clear and moist. No oropharyngeal exudate.  Eyes: Conjunctivae and EOM are normal. Pupils are equal, round, and reactive to light. Right eye exhibits no discharge. Left eye exhibits no discharge. No scleral icterus.  Dry eyes  Neck: Normal range of motion. Neck supple. No JVD present. No thyromegaly present.  Cardiovascular: Normal rate.   Murmur heard. Pacemaker. Systolic ejection murmur 3/6  Pulmonary/Chest: Effort normal and breath sounds normal. No respiratory distress. She has no wheezes. She has no rales. She exhibits no tenderness.  Abdominal: Soft. Bowel sounds are normal. She exhibits no distension. There is no tenderness. There is no rebound.  Musculoskeletal: Normal range of motion. She  exhibits tenderness. She exhibits no edema.  Left hip and leg pain  Lymphadenopathy:    She has no cervical adenopathy.  Neurological: She is alert and oriented to person, place, and time. She has normal reflexes. No cranial nerve deficit. She exhibits  normal muscle tone. Coordination normal.  Skin: Skin is warm and dry. No rash noted. She is not diaphoretic. No erythema. No pallor.  Left shin open wound 4 in, redness.    Psychiatric: Thought content normal. Her mood appears not anxious. Her affect is not angry, not blunt, not labile and not inappropriate. Her speech is not rapid and/or pressured, not delayed and not slurred. She is slowed. She is not agitated, not aggressive, not hyperactive, not withdrawn, not actively hallucinating and not combative. Thought content is not paranoid and not delusional. Cognition and memory are impaired. She expresses impulsivity and inappropriate judgment. She exhibits a depressed mood. She exhibits abnormal recent memory.  Confusion, HOH, very low vision. Guards against examination. She is attentive.    Labs reviewed:  Recent Labs  01/27/15  NA 141  K 4.3  BUN 11  CREATININE 0.9    Recent Labs  01/27/15  AST 24  ALT 10  ALKPHOS 80    Recent Labs  01/27/15  WBC 10.8  HGB 15.3  HCT 45  PLT 427*   Lab Results  Component Value Date   TSH 3.55 01/27/2015   Lab Results  Component Value Date   HGBA1C 5.8 01/27/2015   Lab Results  Component Value Date   CHOL 140 12/20/2012   HDL 38 12/20/2012   LDLCALC 57 12/20/2012   TRIG 227* 12/20/2012   CHOLHDL 5.0 12/22/2007    Significant Diagnostic Results in last 30 days:  No results found.  Assessment/Plan  Essential hypertension Controlled, off Metoprolol 50mg  daily and Amlodipine 10mg  daily.     ATRIAL FIBRILLATION Heart rate is in control, off Metoprolol.  Constipation Stable, continue Senna S II nightly.   Hypothyroidism Last TSH wnl 3.554 12/2014, off Levothyroxine  12.71mcg  Dementia 04/03/14 MMSE 22/30.  Comfort measures only, stabilized   Closed fracture of left superior pubic ramus (HCC) X-ray LLE 12/09/15 left superior pubic ramus at the acetabulum hairline extension into the hip joint.  Pain management, activity as tolerated, goal of care is comfort measures.   Depression Mood is stable, off Mirtazapine.   Cellulitis of leg, left Left shin 4 in skin tear, redness, will apply Silvadene cream daily until healed.     Family/ staff Communication: continue SNF for care needs  Labs/tests ordered:  X-ray LLE done 12/09/15

## 2016-01-01 NOTE — Assessment & Plan Note (Signed)
Left shin 4 in skin tear, redness, will apply Silvadene cream daily until healed.

## 2016-01-01 NOTE — Assessment & Plan Note (Signed)
04/03/14 MMSE 22/30.  Comfort measures only, stabilized  

## 2016-01-01 NOTE — Assessment & Plan Note (Signed)
Stable, continue Senna S II nightly.  

## 2016-01-01 NOTE — Assessment & Plan Note (Signed)
Controlled, off Metoprolol 50mg daily and Amlodipine 10mg daily. 

## 2016-01-01 NOTE — Assessment & Plan Note (Signed)
Last TSH wnl 3.554 12/2014, off Levothyroxine 12.5mcg 

## 2016-01-01 NOTE — Assessment & Plan Note (Signed)
Mood is stable, off Mirtazapine.  

## 2016-01-05 DIAGNOSIS — F0151 Vascular dementia with behavioral disturbance: Secondary | ICD-10-CM | POA: Diagnosis not present

## 2016-01-05 DIAGNOSIS — R630 Anorexia: Secondary | ICD-10-CM | POA: Diagnosis not present

## 2016-01-05 DIAGNOSIS — I4891 Unspecified atrial fibrillation: Secondary | ICD-10-CM | POA: Diagnosis not present

## 2016-01-05 DIAGNOSIS — E46 Unspecified protein-calorie malnutrition: Secondary | ICD-10-CM | POA: Diagnosis not present

## 2016-01-05 DIAGNOSIS — R634 Abnormal weight loss: Secondary | ICD-10-CM | POA: Diagnosis not present

## 2016-01-05 DIAGNOSIS — I495 Sick sinus syndrome: Secondary | ICD-10-CM | POA: Diagnosis not present

## 2016-01-06 DIAGNOSIS — I4891 Unspecified atrial fibrillation: Secondary | ICD-10-CM | POA: Diagnosis not present

## 2016-01-06 DIAGNOSIS — R630 Anorexia: Secondary | ICD-10-CM | POA: Diagnosis not present

## 2016-01-06 DIAGNOSIS — E46 Unspecified protein-calorie malnutrition: Secondary | ICD-10-CM | POA: Diagnosis not present

## 2016-01-06 DIAGNOSIS — F0151 Vascular dementia with behavioral disturbance: Secondary | ICD-10-CM | POA: Diagnosis not present

## 2016-01-06 DIAGNOSIS — I495 Sick sinus syndrome: Secondary | ICD-10-CM | POA: Diagnosis not present

## 2016-01-06 DIAGNOSIS — R634 Abnormal weight loss: Secondary | ICD-10-CM | POA: Diagnosis not present

## 2016-01-11 ENCOUNTER — Non-Acute Institutional Stay (SKILLED_NURSING_FACILITY): Payer: Medicare Other | Admitting: Nurse Practitioner

## 2016-01-11 ENCOUNTER — Encounter: Payer: Self-pay | Admitting: Nurse Practitioner

## 2016-01-11 DIAGNOSIS — E039 Hypothyroidism, unspecified: Secondary | ICD-10-CM

## 2016-01-11 DIAGNOSIS — K5900 Constipation, unspecified: Secondary | ICD-10-CM | POA: Diagnosis not present

## 2016-01-11 DIAGNOSIS — F0391 Unspecified dementia with behavioral disturbance: Secondary | ICD-10-CM | POA: Diagnosis not present

## 2016-01-11 DIAGNOSIS — F329 Major depressive disorder, single episode, unspecified: Secondary | ICD-10-CM | POA: Diagnosis not present

## 2016-01-11 DIAGNOSIS — R634 Abnormal weight loss: Secondary | ICD-10-CM | POA: Diagnosis not present

## 2016-01-11 DIAGNOSIS — R627 Adult failure to thrive: Secondary | ICD-10-CM | POA: Diagnosis not present

## 2016-01-11 DIAGNOSIS — F32A Depression, unspecified: Secondary | ICD-10-CM

## 2016-01-11 DIAGNOSIS — E46 Unspecified protein-calorie malnutrition: Secondary | ICD-10-CM | POA: Diagnosis not present

## 2016-01-11 DIAGNOSIS — I495 Sick sinus syndrome: Secondary | ICD-10-CM | POA: Diagnosis not present

## 2016-01-11 DIAGNOSIS — I4891 Unspecified atrial fibrillation: Secondary | ICD-10-CM | POA: Diagnosis not present

## 2016-01-11 DIAGNOSIS — I10 Essential (primary) hypertension: Secondary | ICD-10-CM

## 2016-01-11 DIAGNOSIS — F0151 Vascular dementia with behavioral disturbance: Secondary | ICD-10-CM | POA: Diagnosis not present

## 2016-01-11 DIAGNOSIS — R630 Anorexia: Secondary | ICD-10-CM | POA: Diagnosis not present

## 2016-01-11 NOTE — Assessment & Plan Note (Signed)
04/03/14 MMSE 22/30.  Comfort measures only, stabilized  

## 2016-01-11 NOTE — Assessment & Plan Note (Signed)
Last TSH wnl 3.554 12/2014, off Levothyroxine 12.5mcg 

## 2016-01-11 NOTE — Assessment & Plan Note (Signed)
Mood is stable, off Mirtazapine.  

## 2016-01-11 NOTE — Progress Notes (Signed)
Patient ID: [name], female   DOB: September 30, 1914, 82 y.o.   MRN: [id number]  Location:  [facility] Room Number: 105 Place of Service:   SNF Provider: Arna SnipeManXie Lindalou Soltis NP  GREEN, Lenon CurtARTHUR G, MD  Patient Care Team: Kimber RelicArthur G Green, MD as PCP - General (Internal Medicine) Heyli Min Johnney OuX Amed Datta, NP as Nurse Practitioner (Nurse Practitioner) [facility] [place] M Harle StanfordKimbrough Jr., MD as Consulting Physician (Urology) Rachael Feeaniel P Jacobs, MD as Consulting Physician (Gastroenterology) Salvatore Marvelobert Wainer, MD as Consulting Physician (Orthopedic Surgery) Venancio PoissonLaura Lomax, MD as Consulting Physician (Dermatology) Sheral Apleyimothy D Murphy, MD as Attending Physician (Orthopedic Surgery)  Extended Emergency Contact Information Primary Emergency Contact: Cook,[name] Address: [address]          [place], [place] [address] [place] Home Phone: [phone] Work Phone: [phone] Mobile Phone: [phone] Relation: Son Secondary Emergency Contact: Hargett,[name] Address: [address]          [place], [place] [address] [place] Home Phone: [phone] Relation: Relative  Code Status:  DNR Goals of care: Advanced Directive information Advanced Directives 01/11/2016  Does patient have an advance directive? Yes  Type of Estate agentAdvance Directive Healthcare Power of The RanchAttorney;Out of facility DNR (pink MOST or yellow form)  Does patient want to make changes to advanced directive? No - Patient declined  Copy of advanced directive(s) in chart? Yes     Chief Complaint  Patient presents with  . Medical Management of Chronic Issues  . Acute Visit    skin tear on inner right wrist    HPI:  Pt is a 82 y.o. female seen today for medical management of chronic diseases.  FTT. Hx of hypothyroidism, last TSH wnl 12/2014 while on Levothyroxine 12..5mcg. Her blood sugar is controlled on diet, last Hgb A1c 5.8 12/2014. Blood pressure and heart rate are controlled on metoprolol. Her mood is stable on  Mirtazapine, dementia requires SNF [facility] for care needs, her urinary incontinent is chronic and managed with adult depends. She has BM daily. She is still able to feed self at meals.    X-ray LLE 12/09/15 left superior pubic ramus at the acetabulum hairline extension into the hip joint.   Past Medical History  Diagnosis Date  . HTN (hypertension)   . Other and unspecified hyperlipidemia   . Hemorrhoids   . AF (atrial fibrillation) (HCC)   . Rectal bleeding   . Sinus node dysfunction (HCC)   . Hip fracture (HCC)   . Bradycardia   . Aortic stenosis   . Mitral valve prolapse   . Hearing loss   . Disturbance of salivary secretion [id number]  . Unspecified hereditary and idiopathic peripheral neuropathy 10/28/202012  . Closed fracture of unspecified trochanteric section of femur 05/29/2011  . Anxiety state, unspecified 08/19/2010  . Diffuse cystic mastopathy 12/23/2009  . Flatulence, eructation, and gas pain 12/03/2009  . Closed fracture of lumbar vertebra without mention of spinal cord injury 107/12/202010  . Personal history of fall 01/29/2009  . Occlusion and stenosis of carotid artery without mention of cerebral infarction 12/27/2007  . Syncope and collapse 12/27/2007  . Other malaise and fatigue 12/06/2007  . Unspecified urinary incontinence 02/22/2005  . Cardiac pacemaker in situ 09/12/2004  . Unspecified hypothyroidism 03/18/2010  . Abnormality of gait 09/08/2003  . Edema 08/18/2003  . Macular degeneration (senile) of retina, unspecified 12/12/2001  . Senile osteoporosis 12/13/2000  . Legal blindness, as defined in [place] 03/20/1999    secondary to macular degeneration  .  Fracture of greater trochanter of left femur (HCC) 03/08/14  . Atrial fibrillation (HCC) 07/14/2009    Qualifier: Diagnosis of  By: [name], MD, Ruthann Cancer Ty Hilts   . Constipation 03/14/2014  . Dementia 2013    04/03/14 MMSE 22/30   . Depression 04/10/2014  . Fall 12/19/2012    07/22/14 fall VS 144/76, 60, 20, 97.1. No apparent injury     . FTT (failure to thrive) in adult 12/15/2014  . Hypothyroidism 12/19/2007    Qualifier: Diagnosis of  By: Melvyn Neth CMA (AAMA), Patty  01/16/14 TSH 2.474 03/24/14 TSH 2.576    . Loss of weight 10/13/2014    Multiple factorials: dementia, depression, advanced age, possible AR of medications(metformin and Oxybutynin)-will increase Mirtazapine and continue supplement. Observe.    . Macular degeneration of both eyes 01/24/2013    Severe visual impairment. Nearly blind.   Margie Ege  medtronic 09/07/2011  . Trochanteric fracture of left femur (HCC) 03/14/2014    CT scan did however reveal acute fracture in greater left trochanteric. Dr. Margarita Rana consulted. Recommended nonoperative management and weightbearing as tolerated and another 3 weeks of Lovenox for DVT prophylaxis.       . Type 2 diabetes mellitus with blindness, with macular edema, with severe nonproliferative retinopathy 12/19/2007    Qualifier: Diagnosis of  By: Melvyn Neth CMA (AAMA), Patty  01/09/14 Hgb A1c 6.5 01/16/14 Hgb A1c 6.5 03/24/14 Hgb 6.4 12/10/14 pharm: dc Metformin and CBG monitoring-failure to thrive and refusing to eat.     . Urinary tract infection, site not specified 04/10/2014  . Xerophthalmus 01/21/2015   Past Surgical History  Procedure Laterality Date  . Pacemaker insertion  2006    Medtronic In-Pulse  . Left hip surgery  09/2005  . Right hip surgery  08/2003  . 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 Tonsillectomy    . Breast biopsy Left 1970    benign    Allergies  Allergen Reactions  . Codeine     unknown  . Ibuprofen     unknown  . Lisinopril     unknown  . Penicillins     unknown  . Sanctura [Trospium Chloride]     unknown      Medication List       This list is accurate as of: 01/11/16 11:59 PM.  Always use your most recent med list.               atropine 1 % ophthalmic solution  Place 4 drops every 2 hours as needed for  excess secretions     bisacodyl 10 MG suppository  Commonly known as:  DULCOLAX  Place 10 mg rectally as needed for moderate constipation.     escitalopram 10 MG tablet  Commonly known as:  LEXAPRO  Take 10 mg by mouth daily.     LORazepam 1 MG tablet  Commonly known as:  ATIVAN  Take 1 mg by mouth every 2 (two) hours as needed for anxiety.     morphine 20 MG/ML concentrated solution  Commonly known as:  ROXANOL  Give 0.83ml ( ) by mouth every two hours as needed     sennosides-docusate sodium 8.6-50 MG tablet  Commonly known as:  SENOKOT-S  Take 2 tablets by mouth at bedtime. Reported on 12/11/2015     SILVADENE 1 % cream  Generic drug:  silver sulfADIAZINE  Apply 1 application topically daily. Apply to left shin for  open woound.     TYLENOL 325 MG tablet  Generic drug:  acetaminophen  Take 650 mg by mouth every 4 (four) hours as needed for mild pain or moderate pain.        Review of Systems  Constitutional: Negative for fever and chills.       Emaciated, weight has been stabilized past 3-4 months  HENT: Positive for hearing loss. Negative for congestion, ear discharge and ear pain.   Eyes: Negative for pain, discharge and redness.       Low vision Dry eyes  Respiratory: Positive for cough. Negative for shortness of breath, wheezing and stridor.        Occasional hacking cough, non productive, no O2 desat, afebrile.  Cardiovascular: Negative for chest pain, palpitations and leg swelling.  Gastrointestinal: Negative for nausea, vomiting, abdominal pain and constipation.  Genitourinary: Positive for frequency. Negative for dysuria and urgency.  Musculoskeletal: Positive for arthralgias and gait problem. Negative for myalgias, back pain and neck pain.       Left hip/leg pain.   Skin: Negative for rash.       Left shin open wound 4 in, redness.   Neurological: Negative for dizziness, tremors, seizures, weakness and headaches.  Psychiatric/Behavioral: Negative for  suicidal ideas and hallucinations. The patient is nervous/anxious.     Immunization History  Administered Date(s) Administered  . Influenza-Unspecified 06/06/2013, 04/21/2015  . PPD Test 07/23/2011  . Pneumococcal Polysaccharide-23 03/14/2009  . Td 03/14/2009  . Zoster 06/29/2006   Pertinent  Health Maintenance Due  Topic Date Due  . OPHTHALMOLOGY EXAM  07/28/1927  . URINE MICROALBUMIN  07/28/1927  . DEXA SCAN  07/27/1982  . PNA vac Low Risk Adult (2 of 2 - PCV13) 03/14/2010  . HEMOGLOBIN A1C  07/29/2015  . FOOT EXAM  01/26/2016  . INFLUENZA VACCINE  03/01/2016   Fall Risk  01/26/2015 03/06/2014  Falls in the past year? Yes Yes  Number falls in past yr: 1 1  Injury with Fall? Yes Yes  Risk Factor Category  - High Fall Risk  Risk for fall due to : History of fall(s);Impaired balance/gait;Impaired mobility -  Follow up Falls evaluation completed -   Functional Status Survey:    Filed Vitals:   01/11/16 1409  BP: 129/63  Pulse: 78  Temp: 98.3 F (36.8 C)  Resp: 20  Height: 5\' 2"  (1.575 m)  Weight: 102 lb 8 oz (46.494 kg)   Body mass index is 18.74 kg/(m^2). Physical Exam  Constitutional: She is oriented to person, place, and time.  Extremely thin  HENT:  Head: Normocephalic and atraumatic.  Right Ear: External ear normal.  Left Ear: External ear normal.  Nose: Nose normal.  Mouth/Throat: Oropharynx is clear and moist. No oropharyngeal exudate.  Eyes: Conjunctivae and EOM are normal. Pupils are equal, round, and reactive to light. Right eye exhibits no discharge. Left eye exhibits no discharge. No scleral icterus.  Dry eyes  Neck: Normal range of motion. Neck supple. No JVD present. No thyromegaly present.  Cardiovascular: Normal rate.   Murmur heard. Pacemaker. Systolic ejection murmur 3/6  Pulmonary/Chest: Effort normal and breath sounds normal. No respiratory distress. She has no wheezes. She has no rales. She exhibits no tenderness.  Abdominal: Soft. Bowel  sounds are normal. She exhibits no distension. There is no tenderness. There is no rebound.  Musculoskeletal: Normal range of motion. She exhibits tenderness. She exhibits no edema.  Left hip and leg pain  Lymphadenopathy:    She  has no cervical adenopathy.  Neurological: She is alert and oriented to person, place, and time. She has normal reflexes. No cranial nerve deficit. She exhibits normal muscle tone. Coordination normal.  Skin: Skin is warm and dry. No rash noted. She is not diaphoretic. No erythema. No pallor.  Left shin open wound 4 in, redness.    Psychiatric: Thought content normal. Her mood appears not anxious. Her affect is not angry, not blunt, not labile and not inappropriate. Her speech is not rapid and/or pressured, not delayed and not slurred. She is slowed. She is not agitated, not aggressive, not hyperactive, not withdrawn, not actively hallucinating and not combative. Thought content is not paranoid and not delusional. Cognition and memory are impaired. She expresses impulsivity and inappropriate judgment. She exhibits a depressed mood. She exhibits abnormal recent memory.  Confusion, HOH, very low vision. Guards against examination. She is attentive.    Labs reviewed:  Recent Labs  01/27/15  NA 141  K 4.3  BUN 11  CREATININE 0.9    Recent Labs  01/27/15  AST 24  ALT 10  ALKPHOS 80    Recent Labs  01/27/15  WBC 10.8  HGB 15.3  HCT 45  PLT 427*   Lab Results  Component Value Date   TSH 3.55 01/27/2015   Lab Results  Component Value Date   HGBA1C 5.8 01/27/2015   Lab Results  Component Value Date   CHOL 140 12/20/2012   HDL 38 12/20/2012   LDLCALC 57 12/20/2012   TRIG 227* 12/20/2012   CHOLHDL 5.0 12/22/2007    Significant Diagnostic Results in last 30 days:  No results found.  Assessment/Plan  Essential hypertension Controlled, off Metoprolol 50mg  daily and Amlodipine 10mg  daily.  Constipation Stable, continue Senna S II nightly.    Hypothyroidism Last TSH wnl 3.554 12/2014, off Levothyroxine 12.37mcg   Dementia 04/03/14 MMSE 22/30.  Comfort measures only, stabilized   Depression Mood is stable, off Mirtazapine.   FTT (failure to thrive) in adult Persists, HOH, low vision, memory deficits, emotional liability, weight loss. Continue supportive care, continue prn Morphine, Atropine sol, Lorazepam for comfort measure      Family/ staff Communication: continue SNF for care needs  Labs/tests ordered:  none

## 2016-01-11 NOTE — Assessment & Plan Note (Signed)
Persists, HOH, low vision, memory deficits, emotional liability, weight loss. Continue supportive care, continue prn Morphine, Atropine sol, Lorazepam for comfort measure

## 2016-01-11 NOTE — Assessment & Plan Note (Signed)
Stable, continue Senna S II nightly.  

## 2016-01-11 NOTE — Assessment & Plan Note (Signed)
Controlled, off Metoprolol 50mg daily and Amlodipine 10mg daily. 

## 2016-01-18 DIAGNOSIS — F0151 Vascular dementia with behavioral disturbance: Secondary | ICD-10-CM | POA: Diagnosis not present

## 2016-01-18 DIAGNOSIS — I495 Sick sinus syndrome: Secondary | ICD-10-CM | POA: Diagnosis not present

## 2016-01-18 DIAGNOSIS — E46 Unspecified protein-calorie malnutrition: Secondary | ICD-10-CM | POA: Diagnosis not present

## 2016-01-18 DIAGNOSIS — I4891 Unspecified atrial fibrillation: Secondary | ICD-10-CM | POA: Diagnosis not present

## 2016-01-18 DIAGNOSIS — R630 Anorexia: Secondary | ICD-10-CM | POA: Diagnosis not present

## 2016-01-18 DIAGNOSIS — R634 Abnormal weight loss: Secondary | ICD-10-CM | POA: Diagnosis not present

## 2016-01-25 DIAGNOSIS — E46 Unspecified protein-calorie malnutrition: Secondary | ICD-10-CM | POA: Diagnosis not present

## 2016-01-25 DIAGNOSIS — R634 Abnormal weight loss: Secondary | ICD-10-CM | POA: Diagnosis not present

## 2016-01-25 DIAGNOSIS — I495 Sick sinus syndrome: Secondary | ICD-10-CM | POA: Diagnosis not present

## 2016-01-25 DIAGNOSIS — F0151 Vascular dementia with behavioral disturbance: Secondary | ICD-10-CM | POA: Diagnosis not present

## 2016-01-25 DIAGNOSIS — I4891 Unspecified atrial fibrillation: Secondary | ICD-10-CM | POA: Diagnosis not present

## 2016-01-25 DIAGNOSIS — R630 Anorexia: Secondary | ICD-10-CM | POA: Diagnosis not present

## 2016-01-29 DIAGNOSIS — I495 Sick sinus syndrome: Secondary | ICD-10-CM | POA: Diagnosis not present

## 2016-01-29 DIAGNOSIS — R634 Abnormal weight loss: Secondary | ICD-10-CM | POA: Diagnosis not present

## 2016-01-29 DIAGNOSIS — R630 Anorexia: Secondary | ICD-10-CM | POA: Diagnosis not present

## 2016-01-29 DIAGNOSIS — I4891 Unspecified atrial fibrillation: Secondary | ICD-10-CM | POA: Diagnosis not present

## 2016-01-29 DIAGNOSIS — F0151 Vascular dementia with behavioral disturbance: Secondary | ICD-10-CM | POA: Diagnosis not present

## 2016-01-29 DIAGNOSIS — E46 Unspecified protein-calorie malnutrition: Secondary | ICD-10-CM | POA: Diagnosis not present

## 2016-01-30 DIAGNOSIS — M84359S Stress fracture, hip, unspecified, sequela: Secondary | ICD-10-CM | POA: Diagnosis not present

## 2016-01-30 DIAGNOSIS — I10 Essential (primary) hypertension: Secondary | ICD-10-CM | POA: Diagnosis not present

## 2016-01-30 DIAGNOSIS — F339 Major depressive disorder, recurrent, unspecified: Secondary | ICD-10-CM | POA: Diagnosis not present

## 2016-01-30 DIAGNOSIS — R630 Anorexia: Secondary | ICD-10-CM | POA: Diagnosis not present

## 2016-01-30 DIAGNOSIS — I4891 Unspecified atrial fibrillation: Secondary | ICD-10-CM | POA: Diagnosis not present

## 2016-01-30 DIAGNOSIS — E039 Hypothyroidism, unspecified: Secondary | ICD-10-CM | POA: Diagnosis not present

## 2016-01-30 DIAGNOSIS — F0151 Vascular dementia with behavioral disturbance: Secondary | ICD-10-CM | POA: Diagnosis not present

## 2016-01-30 DIAGNOSIS — R634 Abnormal weight loss: Secondary | ICD-10-CM | POA: Diagnosis not present

## 2016-01-30 DIAGNOSIS — I495 Sick sinus syndrome: Secondary | ICD-10-CM | POA: Diagnosis not present

## 2016-01-30 DIAGNOSIS — E46 Unspecified protein-calorie malnutrition: Secondary | ICD-10-CM | POA: Diagnosis not present

## 2016-01-30 DIAGNOSIS — H3530 Unspecified macular degeneration: Secondary | ICD-10-CM | POA: Diagnosis not present

## 2016-01-30 DIAGNOSIS — E785 Hyperlipidemia, unspecified: Secondary | ICD-10-CM | POA: Diagnosis not present

## 2016-01-30 DIAGNOSIS — E1159 Type 2 diabetes mellitus with other circulatory complications: Secondary | ICD-10-CM | POA: Diagnosis not present

## 2016-02-02 DIAGNOSIS — I495 Sick sinus syndrome: Secondary | ICD-10-CM | POA: Diagnosis not present

## 2016-02-02 DIAGNOSIS — E46 Unspecified protein-calorie malnutrition: Secondary | ICD-10-CM | POA: Diagnosis not present

## 2016-02-02 DIAGNOSIS — I4891 Unspecified atrial fibrillation: Secondary | ICD-10-CM | POA: Diagnosis not present

## 2016-02-02 DIAGNOSIS — R630 Anorexia: Secondary | ICD-10-CM | POA: Diagnosis not present

## 2016-02-02 DIAGNOSIS — F0151 Vascular dementia with behavioral disturbance: Secondary | ICD-10-CM | POA: Diagnosis not present

## 2016-02-02 DIAGNOSIS — R634 Abnormal weight loss: Secondary | ICD-10-CM | POA: Diagnosis not present

## 2016-02-04 DIAGNOSIS — I4891 Unspecified atrial fibrillation: Secondary | ICD-10-CM | POA: Diagnosis not present

## 2016-02-04 DIAGNOSIS — I495 Sick sinus syndrome: Secondary | ICD-10-CM | POA: Diagnosis not present

## 2016-02-04 DIAGNOSIS — R634 Abnormal weight loss: Secondary | ICD-10-CM | POA: Diagnosis not present

## 2016-02-04 DIAGNOSIS — F0151 Vascular dementia with behavioral disturbance: Secondary | ICD-10-CM | POA: Diagnosis not present

## 2016-02-04 DIAGNOSIS — E46 Unspecified protein-calorie malnutrition: Secondary | ICD-10-CM | POA: Diagnosis not present

## 2016-02-04 DIAGNOSIS — R630 Anorexia: Secondary | ICD-10-CM | POA: Diagnosis not present

## 2016-02-08 DIAGNOSIS — E46 Unspecified protein-calorie malnutrition: Secondary | ICD-10-CM | POA: Diagnosis not present

## 2016-02-08 DIAGNOSIS — R634 Abnormal weight loss: Secondary | ICD-10-CM | POA: Diagnosis not present

## 2016-02-08 DIAGNOSIS — I495 Sick sinus syndrome: Secondary | ICD-10-CM | POA: Diagnosis not present

## 2016-02-08 DIAGNOSIS — R630 Anorexia: Secondary | ICD-10-CM | POA: Diagnosis not present

## 2016-02-08 DIAGNOSIS — I4891 Unspecified atrial fibrillation: Secondary | ICD-10-CM | POA: Diagnosis not present

## 2016-02-08 DIAGNOSIS — F0151 Vascular dementia with behavioral disturbance: Secondary | ICD-10-CM | POA: Diagnosis not present

## 2016-02-13 DIAGNOSIS — E46 Unspecified protein-calorie malnutrition: Secondary | ICD-10-CM | POA: Diagnosis not present

## 2016-02-13 DIAGNOSIS — R634 Abnormal weight loss: Secondary | ICD-10-CM | POA: Diagnosis not present

## 2016-02-13 DIAGNOSIS — I4891 Unspecified atrial fibrillation: Secondary | ICD-10-CM | POA: Diagnosis not present

## 2016-02-13 DIAGNOSIS — I495 Sick sinus syndrome: Secondary | ICD-10-CM | POA: Diagnosis not present

## 2016-02-13 DIAGNOSIS — F0151 Vascular dementia with behavioral disturbance: Secondary | ICD-10-CM | POA: Diagnosis not present

## 2016-02-13 DIAGNOSIS — R630 Anorexia: Secondary | ICD-10-CM | POA: Diagnosis not present

## 2016-02-15 DIAGNOSIS — R634 Abnormal weight loss: Secondary | ICD-10-CM | POA: Diagnosis not present

## 2016-02-15 DIAGNOSIS — E46 Unspecified protein-calorie malnutrition: Secondary | ICD-10-CM | POA: Diagnosis not present

## 2016-02-15 DIAGNOSIS — I4891 Unspecified atrial fibrillation: Secondary | ICD-10-CM | POA: Diagnosis not present

## 2016-02-15 DIAGNOSIS — R630 Anorexia: Secondary | ICD-10-CM | POA: Diagnosis not present

## 2016-02-15 DIAGNOSIS — F0151 Vascular dementia with behavioral disturbance: Secondary | ICD-10-CM | POA: Diagnosis not present

## 2016-02-15 DIAGNOSIS — I495 Sick sinus syndrome: Secondary | ICD-10-CM | POA: Diagnosis not present

## 2016-02-20 DIAGNOSIS — F0151 Vascular dementia with behavioral disturbance: Secondary | ICD-10-CM | POA: Diagnosis not present

## 2016-02-20 DIAGNOSIS — R634 Abnormal weight loss: Secondary | ICD-10-CM | POA: Diagnosis not present

## 2016-02-20 DIAGNOSIS — I4891 Unspecified atrial fibrillation: Secondary | ICD-10-CM | POA: Diagnosis not present

## 2016-02-20 DIAGNOSIS — E46 Unspecified protein-calorie malnutrition: Secondary | ICD-10-CM | POA: Diagnosis not present

## 2016-02-20 DIAGNOSIS — I495 Sick sinus syndrome: Secondary | ICD-10-CM | POA: Diagnosis not present

## 2016-02-20 DIAGNOSIS — R630 Anorexia: Secondary | ICD-10-CM | POA: Diagnosis not present

## 2016-02-22 DIAGNOSIS — R630 Anorexia: Secondary | ICD-10-CM | POA: Diagnosis not present

## 2016-02-22 DIAGNOSIS — F0151 Vascular dementia with behavioral disturbance: Secondary | ICD-10-CM | POA: Diagnosis not present

## 2016-02-22 DIAGNOSIS — I4891 Unspecified atrial fibrillation: Secondary | ICD-10-CM | POA: Diagnosis not present

## 2016-02-22 DIAGNOSIS — I495 Sick sinus syndrome: Secondary | ICD-10-CM | POA: Diagnosis not present

## 2016-02-22 DIAGNOSIS — R634 Abnormal weight loss: Secondary | ICD-10-CM | POA: Diagnosis not present

## 2016-02-22 DIAGNOSIS — E46 Unspecified protein-calorie malnutrition: Secondary | ICD-10-CM | POA: Diagnosis not present

## 2016-03-01 DIAGNOSIS — I4891 Unspecified atrial fibrillation: Secondary | ICD-10-CM | POA: Diagnosis not present

## 2016-03-01 DIAGNOSIS — R634 Abnormal weight loss: Secondary | ICD-10-CM | POA: Diagnosis not present

## 2016-03-01 DIAGNOSIS — H3530 Unspecified macular degeneration: Secondary | ICD-10-CM | POA: Diagnosis not present

## 2016-03-01 DIAGNOSIS — E785 Hyperlipidemia, unspecified: Secondary | ICD-10-CM | POA: Diagnosis not present

## 2016-03-01 DIAGNOSIS — R630 Anorexia: Secondary | ICD-10-CM | POA: Diagnosis not present

## 2016-03-01 DIAGNOSIS — E46 Unspecified protein-calorie malnutrition: Secondary | ICD-10-CM | POA: Diagnosis not present

## 2016-03-01 DIAGNOSIS — I10 Essential (primary) hypertension: Secondary | ICD-10-CM | POA: Diagnosis not present

## 2016-03-01 DIAGNOSIS — I495 Sick sinus syndrome: Secondary | ICD-10-CM | POA: Diagnosis not present

## 2016-03-01 DIAGNOSIS — M84359S Stress fracture, hip, unspecified, sequela: Secondary | ICD-10-CM | POA: Diagnosis not present

## 2016-03-01 DIAGNOSIS — F339 Major depressive disorder, recurrent, unspecified: Secondary | ICD-10-CM | POA: Diagnosis not present

## 2016-03-01 DIAGNOSIS — F0151 Vascular dementia with behavioral disturbance: Secondary | ICD-10-CM | POA: Diagnosis not present

## 2016-03-01 DIAGNOSIS — E1159 Type 2 diabetes mellitus with other circulatory complications: Secondary | ICD-10-CM | POA: Diagnosis not present

## 2016-03-01 DIAGNOSIS — E039 Hypothyroidism, unspecified: Secondary | ICD-10-CM | POA: Diagnosis not present

## 2016-03-03 DIAGNOSIS — F0151 Vascular dementia with behavioral disturbance: Secondary | ICD-10-CM | POA: Diagnosis not present

## 2016-03-03 DIAGNOSIS — R634 Abnormal weight loss: Secondary | ICD-10-CM | POA: Diagnosis not present

## 2016-03-03 DIAGNOSIS — R630 Anorexia: Secondary | ICD-10-CM | POA: Diagnosis not present

## 2016-03-03 DIAGNOSIS — I4891 Unspecified atrial fibrillation: Secondary | ICD-10-CM | POA: Diagnosis not present

## 2016-03-03 DIAGNOSIS — E46 Unspecified protein-calorie malnutrition: Secondary | ICD-10-CM | POA: Diagnosis not present

## 2016-03-03 DIAGNOSIS — I495 Sick sinus syndrome: Secondary | ICD-10-CM | POA: Diagnosis not present

## 2016-03-07 DIAGNOSIS — R630 Anorexia: Secondary | ICD-10-CM | POA: Diagnosis not present

## 2016-03-07 DIAGNOSIS — F0151 Vascular dementia with behavioral disturbance: Secondary | ICD-10-CM | POA: Diagnosis not present

## 2016-03-07 DIAGNOSIS — R634 Abnormal weight loss: Secondary | ICD-10-CM | POA: Diagnosis not present

## 2016-03-07 DIAGNOSIS — I4891 Unspecified atrial fibrillation: Secondary | ICD-10-CM | POA: Diagnosis not present

## 2016-03-07 DIAGNOSIS — I495 Sick sinus syndrome: Secondary | ICD-10-CM | POA: Diagnosis not present

## 2016-03-07 DIAGNOSIS — E46 Unspecified protein-calorie malnutrition: Secondary | ICD-10-CM | POA: Diagnosis not present

## 2016-03-14 DIAGNOSIS — F0151 Vascular dementia with behavioral disturbance: Secondary | ICD-10-CM | POA: Diagnosis not present

## 2016-03-14 DIAGNOSIS — R634 Abnormal weight loss: Secondary | ICD-10-CM | POA: Diagnosis not present

## 2016-03-14 DIAGNOSIS — R630 Anorexia: Secondary | ICD-10-CM | POA: Diagnosis not present

## 2016-03-14 DIAGNOSIS — I495 Sick sinus syndrome: Secondary | ICD-10-CM | POA: Diagnosis not present

## 2016-03-14 DIAGNOSIS — E46 Unspecified protein-calorie malnutrition: Secondary | ICD-10-CM | POA: Diagnosis not present

## 2016-03-14 DIAGNOSIS — I4891 Unspecified atrial fibrillation: Secondary | ICD-10-CM | POA: Diagnosis not present

## 2016-03-16 DIAGNOSIS — R630 Anorexia: Secondary | ICD-10-CM | POA: Diagnosis not present

## 2016-03-16 DIAGNOSIS — I4891 Unspecified atrial fibrillation: Secondary | ICD-10-CM | POA: Diagnosis not present

## 2016-03-16 DIAGNOSIS — E46 Unspecified protein-calorie malnutrition: Secondary | ICD-10-CM | POA: Diagnosis not present

## 2016-03-16 DIAGNOSIS — I495 Sick sinus syndrome: Secondary | ICD-10-CM | POA: Diagnosis not present

## 2016-03-16 DIAGNOSIS — R634 Abnormal weight loss: Secondary | ICD-10-CM | POA: Diagnosis not present

## 2016-03-16 DIAGNOSIS — F0151 Vascular dementia with behavioral disturbance: Secondary | ICD-10-CM | POA: Diagnosis not present

## 2016-03-21 ENCOUNTER — Encounter: Payer: Self-pay | Admitting: Internal Medicine

## 2016-03-21 ENCOUNTER — Non-Acute Institutional Stay (SKILLED_NURSING_FACILITY): Payer: Medicare Other | Admitting: Internal Medicine

## 2016-03-21 DIAGNOSIS — I480 Paroxysmal atrial fibrillation: Secondary | ICD-10-CM | POA: Diagnosis not present

## 2016-03-21 DIAGNOSIS — I10 Essential (primary) hypertension: Secondary | ICD-10-CM

## 2016-03-21 DIAGNOSIS — E039 Hypothyroidism, unspecified: Secondary | ICD-10-CM

## 2016-03-21 DIAGNOSIS — F0391 Unspecified dementia with behavioral disturbance: Secondary | ICD-10-CM

## 2016-03-21 DIAGNOSIS — R627 Adult failure to thrive: Secondary | ICD-10-CM | POA: Diagnosis not present

## 2016-03-21 DIAGNOSIS — R634 Abnormal weight loss: Secondary | ICD-10-CM | POA: Diagnosis not present

## 2016-03-21 DIAGNOSIS — E113299 Type 2 diabetes mellitus with mild nonproliferative diabetic retinopathy without macular edema, unspecified eye: Secondary | ICD-10-CM | POA: Diagnosis not present

## 2016-03-21 NOTE — Progress Notes (Signed)
Location:   [facility] Room Number: N105 Place of Service:  SNF [phone] Provider:  Murray HodgkinsArthur Green, MD  Patient Care Team: Kimber RelicArthur G Green, MD as PCP - General (Internal Medicine) Man Johnney OuX Mast, NP as Nurse Practitioner (Nurse Practitioner) [facility] Harle StanfordKimbrough Jr., MD as Consulting Physician (Urology) Rachael Feeaniel P Jacobs, MD as Consulting Physician (Gastroenterology) Salvatore Marvelobert Wainer, MD as Consulting Physician (Orthopedic Surgery) Venancio PoissonLaura Lomax, MD as Consulting Physician (Dermatology) Sheral Apleyimothy D Murphy, MD as Attending Physician (Orthopedic Surgery)  Extended Emergency Contact Information Primary Emergency Contact: Cook,[name] Address: [address]          [place], [place] [address] [place] Home Phone: [phone] Work Phone: [phone] Mobile Phone: [phone] Relation: Son Secondary Emergency Contact: Jetter,[name] Address: [address]          [place], [place] [address] [place] Home Phone: [phone] Relation: Relative  Code Status:  DNR Goals of care: Advanced Directive information Advanced Directives 03/21/2016  Does patient have an advance directive? Yes  Type of Estate agentAdvance Directive Healthcare Power of [place];Living will;Out of facility DNR (pink MOST or yellow form)  Does patient want to make changes to advanced directive? -  Copy of advanced directive(s) in chart? Yes  Pre-existing out of facility DNR order (yellow form or pink MOST form) -     Chief Complaint  Patient presents with  . Medical Management of Chronic Issues    routine medication management    HPI:  Pt is a 82 y.o. female seen today for medical management of chronic diseases.    Dementia, with behavioral disturbance - worsening  DM type 2 with diabetic background retinopathy (HCC) -- controlled  Essential hypertension - controlled  FTT (failure to thrive) in adult - weight has stabilized. She remains very frail.  Hypothyroidism,  unspecified hypothyroidism type - compensated  Loss of weight - stable for several months  Paroxysmal atrial fibrillation (HCC) - I thibk she is in NSR today.     Past Medical History:  Diagnosis Date  . Abnormality of gait 09/08/2003  . AF (atrial fibrillation) (HCC)   . Anxiety state, unspecified 08/19/2010  . Aortic stenosis   . Atrial fibrillation (HCC) 07/14/2009   Qualifier: Diagnosis of  By: Graciela HusbandsKlein, MD, Ruthann CancerFACC, Ty HiltsSteven Cochran   . Bradycardia   . Cardiac pacemaker in situ 09/12/2004  . Closed fracture of lumbar vertebra without mention of spinal cord injury 1February 28, 202010  . Closed fracture of unspecified trochanteric section of femur 05/29/2011  . Constipation 03/14/2014  . Dementia 2013   04/03/14 MMSE 22/30   . Depression 04/10/2014  . Diffuse cystic mastopathy 12/23/2009  . Disturbance of salivary secretion [id number]  . Edema 08/18/2003  . Fall 12/19/2012   07/22/14 fall VS 144/76, 60, 20, 97.1. No apparent injury   . Flatulence, eructation, and gas pain 12/03/2009  . Fracture of greater trochanter of left femur (HCC) 03/08/14  . FTT (failure to thrive) in adult 12/15/2014  . Hearing loss   . Hemorrhoids   . Hip fracture (HCC)   . HTN (hypertension)   . Hypothyroidism 12/19/2007   Qualifier: Diagnosis of  By: Melvyn NethLewis CMA (AAMA), Patty  01/16/14 TSH 2.474 03/24/14 TSH 2.576    . 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 Loss of weight 10/13/2014   Multiple factorials: dementia, depression, advanced age, possible AR of medications(metformin and Oxybutynin)-will increase Mirtazapine and continue supplement. Observe.    .Marland Kitchen  Macular degeneration (senile) of retina, unspecified 12/12/2001  . Macular degeneration of both eyes 01/24/2013   Severe visual impairment. Nearly blind.   . Mitral valve prolapse   . Occlusion and stenosis of carotid artery without mention of cerebral infarction 12/27/2007  . Other and unspecified hyperlipidemia   . Other malaise and  fatigue 12/06/2007  . Pacemaker-dual  medtronic 09/07/2011  . Personal history of fall 01/29/2009  . Rectal bleeding   . Senile osteoporosis 12/13/2000  . Sinus node dysfunction (HCC)   . Syncope and collapse 12/27/2007  . Trochanteric fracture of left femur (HCC) 03/14/2014   CT scan did however reveal acute fracture in greater left trochanteric. Dr. Margarita Rana consulted. Recommended nonoperative management and weightbearing as tolerated and another 3 weeks of Lovenox for DVT prophylaxis.       . Type 2 diabetes mellitus with blindness, with macular edema, with severe nonproliferative retinopathy 12/19/2007   Qualifier: Diagnosis of  By: Melvyn Neth CMA (AAMA), Patty  01/09/14 Hgb A1c 6.5 01/16/14 Hgb A1c 6.5 03/24/14 Hgb 6.4 12/10/14 pharm: dc Metformin and CBG monitoring-failure to thrive and refusing to eat.     Marland Kitchen Unspecified hereditary and idiopathic peripheral neuropathy 10/28/202012  . Unspecified hypothyroidism 03/18/2010  . Unspecified urinary incontinence 02/22/2005  . Urinary tract infection, site not specified 04/10/2014  . Xerophthalmus 01/21/2015   Past Surgical History:  Procedure Laterality Date  . BREAST BIOPSY Left 1970   benign  . Left hip surgery  09/2005  . PACEMAKER INSERTION  2006   Medtronic In-Pulse  . Right hip surgery  08/2003  . TONSILLECTOMY    . 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 Codeine     unknown  . Ibuprofen     unknown  . Lisinopril     unknown  . Penicillins     unknown  . Sanctura [Trospium Chloride]     unknown      Medication List       Accurate as of 03/21/16  2:33 PM. Always use your most recent med list.          atropine 1 % ophthalmic solution Place 4 drops every 2 hours as needed for excess secretions   bisacodyl 10 MG suppository Commonly known as:  DULCOLAX Place 10 mg rectally as needed for moderate constipation.   escitalopram  10 MG tablet Commonly known as:  LEXAPRO Take 10 mg by mouth daily.   LORazepam 1 MG tablet Commonly known as:  ATIVAN Take 1 mg by mouth every 2 (two) hours as needed for anxiety.   morphine 20 MG/ML concentrated solution Commonly known as:  ROXANOL Give 0.51ml (5mg ) by mouth every two hours as needed   sennosides-docusate sodium 8.6-50 MG tablet Commonly known as:  SENOKOT-S Take 2 tablets by mouth at bedtime. Reported on 12/11/2015   TYLENOL 325 MG tablet Generic drug:  acetaminophen Take 650 mg by mouth every 4 (four) hours as needed for mild pain or moderate pain.       Review of Systems  Constitutional: Negative for chills and fever.       Emaciated, weight has been stabilized past 3-4 months  HENT: Positive for hearing loss. Negative for congestion, ear discharge and ear pain.   Eyes: Negative for pain, discharge and redness.       Low vision Dry eyes  Respiratory: Negative for cough, shortness of breath, wheezing  and stridor.        Occasional hacking cough, non productive, no O2 desat, afebrile.  Cardiovascular: Negative for chest pain, palpitations and leg swelling.  Gastrointestinal: Negative for abdominal pain, constipation, nausea and vomiting.  Genitourinary: Positive for frequency. Negative for dysuria and urgency.  Musculoskeletal: Positive for arthralgias and gait problem. Negative for back pain, myalgias and neck pain.       Left hip/leg pain.   Skin: Negative for rash.       Left shin open wound imporoved.  Neurological: Negative for dizziness, tremors, seizures, weakness and headaches.  Hematological: Negative.   Psychiatric/Behavioral: Positive for confusion and decreased concentration. Negative for hallucinations and suicidal ideas. The patient is nervous/anxious.     Immunization History  Administered Date(s) Administered  . Influenza-Unspecified 06/06/2013, 04/21/2015  . PPD Test 07/23/2011  . Pneumococcal Polysaccharide-23 03/14/2009  . Td  03/14/2009  . Zoster 06/29/2006   Pertinent  Health Maintenance Due  Topic Date Due  . OPHTHALMOLOGY EXAM  [birth year]27  . URINE MICROALBUMIN  [birth year]27  . DEXA SCAN  07/27/1982  . PNA vac Low Risk Adult (2 of 2 - PCV13) 03/14/2010  . HEMOGLOBIN A1C  07/29/2015  . FOOT EXAM  01/26/2016  . INFLUENZA VACCINE  03/01/2016   Fall Risk  01/26/2015 03/06/2014  Falls in the past year? Yes Yes  Number falls in past yr: 1 1  Injury with Fall? Yes Yes  Risk Factor Category  - High Fall Risk  Risk for fall due to : History of fall(s);Impaired balance/gait;Impaired mobility -  Follow up Falls evaluation completed -   Functional Status Survey:    Vitals:   03/21/16 1415  BP: 124/72  Pulse: 68  Resp: 18  Temp: 97.7 F (36.5 C)  Weight: 97 lb (44 kg)  Height: 5\' 2"  (1.575 m)   Body mass index is 17.74 kg/m. Physical Exam  Constitutional:  Extremely thin  HENT:  Head: Normocephalic and atraumatic.  Right Ear: External ear normal.  Left Ear: External ear normal.  Nose: Nose normal.  Mouth/Throat: Oropharynx is clear and moist. No oropharyngeal exudate.  Eyes: Conjunctivae and EOM are normal. Pupils are equal, round, and reactive to light. Right eye exhibits no discharge. Left eye exhibits no discharge. No scleral icterus.  Dry eyes  Neck: Normal range of motion. Neck supple. No JVD present. No thyromegaly present.  Cardiovascular: Normal rate.   Murmur heard. Pacemaker. Systolic ejection murmur 3/6  Pulmonary/Chest: Effort normal and breath sounds normal. No respiratory distress. She has no wheezes. She has no rales. She exhibits no tenderness.  Abdominal: Soft. Bowel sounds are normal. She exhibits no distension. There is no tenderness. There is no rebound.  Musculoskeletal: Normal range of motion. She exhibits tenderness. She exhibits no edema.  Left hip and leg pain  Lymphadenopathy:    She has no cervical adenopathy.  Neurological: She is alert. She has normal reflexes. No  cranial nerve deficit. She exhibits normal muscle tone. Coordination normal.  Skin: Skin is warm and dry. No rash noted. She is not diaphoretic. No erythema. No pallor.  Left shin open wound healing.   Psychiatric: Thought content normal. Her mood appears not anxious. Her affect is not angry, not blunt, not labile and not inappropriate. Her speech is not rapid and/or pressured, not delayed and not slurred. She is slowed. She is not agitated, not aggressive, not hyperactive, not withdrawn, not actively hallucinating and not combative. Thought content is not paranoid and not delusional. Cognition and  memory are impaired. She expresses impulsivity and inappropriate judgment. She exhibits a depressed mood. She exhibits abnormal recent memory.  Confusion, HOH, very low vision. Guards against examination. She is attentive.    Labs reviewed: No results for input(s): NA, K, CL, CO2, GLUCOSE, BUN, CREATININE, CALCIUM, MG, PHOS in the last 8760 hours. No results for input(s): AST, ALT, ALKPHOS, BILITOT, PROT, ALBUMIN in the last 8760 hours. No results for input(s): WBC, NEUTROABS, HGB, HCT, MCV, PLT in the last 8760 hours. Lab Results  Component Value Date   TSH 3.55 01/27/2015   Lab Results  Component Value Date   HGBA1C 5.8 01/27/2015   Lab Results  Component Value Date   CHOL 140 12/20/2012   HDL 38 12/20/2012   LDLCALC 57 12/20/2012   TRIG 227 (A) 12/20/2012   CHOLHDL 5.0 12/22/2007    Assessment/Plan 1. Dementia, with behavioral disturbance worsening  2. DM type 2 with diabetic background retinopathy (HCC) controlled  3. Essential hypertension controlled  4. FTT (failure to thrive) in adult Stable   5. Hypothyroidism, unspecified hypothyroidism type compensated  6. Loss of weight stable for several months  7. Paroxysmal atrial fibrillation (HCC) Currently in NSR

## 2016-03-24 DIAGNOSIS — E46 Unspecified protein-calorie malnutrition: Secondary | ICD-10-CM | POA: Diagnosis not present

## 2016-03-24 DIAGNOSIS — I4891 Unspecified atrial fibrillation: Secondary | ICD-10-CM | POA: Diagnosis not present

## 2016-03-24 DIAGNOSIS — F0151 Vascular dementia with behavioral disturbance: Secondary | ICD-10-CM | POA: Diagnosis not present

## 2016-03-24 DIAGNOSIS — R634 Abnormal weight loss: Secondary | ICD-10-CM | POA: Diagnosis not present

## 2016-03-24 DIAGNOSIS — R630 Anorexia: Secondary | ICD-10-CM | POA: Diagnosis not present

## 2016-03-24 DIAGNOSIS — I495 Sick sinus syndrome: Secondary | ICD-10-CM | POA: Diagnosis not present

## 2016-03-28 DIAGNOSIS — R630 Anorexia: Secondary | ICD-10-CM | POA: Diagnosis not present

## 2016-03-28 DIAGNOSIS — I495 Sick sinus syndrome: Secondary | ICD-10-CM | POA: Diagnosis not present

## 2016-03-28 DIAGNOSIS — F0151 Vascular dementia with behavioral disturbance: Secondary | ICD-10-CM | POA: Diagnosis not present

## 2016-03-28 DIAGNOSIS — I4891 Unspecified atrial fibrillation: Secondary | ICD-10-CM | POA: Diagnosis not present

## 2016-03-28 DIAGNOSIS — R634 Abnormal weight loss: Secondary | ICD-10-CM | POA: Diagnosis not present

## 2016-03-28 DIAGNOSIS — E46 Unspecified protein-calorie malnutrition: Secondary | ICD-10-CM | POA: Diagnosis not present

## 2016-03-30 DIAGNOSIS — F0151 Vascular dementia with behavioral disturbance: Secondary | ICD-10-CM | POA: Diagnosis not present

## 2016-03-30 DIAGNOSIS — E46 Unspecified protein-calorie malnutrition: Secondary | ICD-10-CM | POA: Diagnosis not present

## 2016-03-30 DIAGNOSIS — R634 Abnormal weight loss: Secondary | ICD-10-CM | POA: Diagnosis not present

## 2016-03-30 DIAGNOSIS — R630 Anorexia: Secondary | ICD-10-CM | POA: Diagnosis not present

## 2016-03-30 DIAGNOSIS — I4891 Unspecified atrial fibrillation: Secondary | ICD-10-CM | POA: Diagnosis not present

## 2016-03-30 DIAGNOSIS — I495 Sick sinus syndrome: Secondary | ICD-10-CM | POA: Diagnosis not present

## 2016-04-01 ENCOUNTER — Encounter: Payer: Self-pay | Admitting: Nurse Practitioner

## 2016-04-01 ENCOUNTER — Non-Acute Institutional Stay (SKILLED_NURSING_FACILITY): Payer: Medicare Other | Admitting: Nurse Practitioner

## 2016-04-01 DIAGNOSIS — F0390 Unspecified dementia without behavioral disturbance: Secondary | ICD-10-CM

## 2016-04-01 DIAGNOSIS — F0151 Vascular dementia with behavioral disturbance: Secondary | ICD-10-CM | POA: Diagnosis not present

## 2016-04-01 DIAGNOSIS — F329 Major depressive disorder, single episode, unspecified: Secondary | ICD-10-CM

## 2016-04-01 DIAGNOSIS — I10 Essential (primary) hypertension: Secondary | ICD-10-CM

## 2016-04-01 DIAGNOSIS — E1159 Type 2 diabetes mellitus with other circulatory complications: Secondary | ICD-10-CM | POA: Diagnosis not present

## 2016-04-01 DIAGNOSIS — I480 Paroxysmal atrial fibrillation: Secondary | ICD-10-CM | POA: Diagnosis not present

## 2016-04-01 DIAGNOSIS — E039 Hypothyroidism, unspecified: Secondary | ICD-10-CM | POA: Diagnosis not present

## 2016-04-01 DIAGNOSIS — H547 Unspecified visual loss: Secondary | ICD-10-CM

## 2016-04-01 DIAGNOSIS — M84359S Stress fracture, hip, unspecified, sequela: Secondary | ICD-10-CM | POA: Diagnosis not present

## 2016-04-01 DIAGNOSIS — H540 Blindness, both eyes: Secondary | ICD-10-CM | POA: Diagnosis not present

## 2016-04-01 DIAGNOSIS — K5900 Constipation, unspecified: Secondary | ICD-10-CM

## 2016-04-01 DIAGNOSIS — R630 Anorexia: Secondary | ICD-10-CM | POA: Diagnosis not present

## 2016-04-01 DIAGNOSIS — I495 Sick sinus syndrome: Secondary | ICD-10-CM | POA: Diagnosis not present

## 2016-04-01 DIAGNOSIS — E785 Hyperlipidemia, unspecified: Secondary | ICD-10-CM | POA: Diagnosis not present

## 2016-04-01 DIAGNOSIS — E113419 Type 2 diabetes mellitus with severe nonproliferative diabetic retinopathy with macular edema, unspecified eye: Secondary | ICD-10-CM

## 2016-04-01 DIAGNOSIS — F32A Depression, unspecified: Secondary | ICD-10-CM

## 2016-04-01 DIAGNOSIS — E46 Unspecified protein-calorie malnutrition: Secondary | ICD-10-CM | POA: Diagnosis not present

## 2016-04-01 DIAGNOSIS — I4891 Unspecified atrial fibrillation: Secondary | ICD-10-CM | POA: Diagnosis not present

## 2016-04-01 DIAGNOSIS — R634 Abnormal weight loss: Secondary | ICD-10-CM | POA: Diagnosis not present

## 2016-04-01 DIAGNOSIS — F339 Major depressive disorder, recurrent, unspecified: Secondary | ICD-10-CM | POA: Diagnosis not present

## 2016-04-01 DIAGNOSIS — R627 Adult failure to thrive: Secondary | ICD-10-CM | POA: Diagnosis not present

## 2016-04-01 DIAGNOSIS — H3530 Unspecified macular degeneration: Secondary | ICD-10-CM | POA: Diagnosis not present

## 2016-04-01 NOTE — Progress Notes (Signed)
Location:   [facility] Room Number: 105 Place of Service:  ALF [phone]) Provider:  Tighe Gitto, Manxie  NP  Murray HodgkinsArthur Green, MD  Patient Care Team: Kimber RelicArthur G Green, MD as PCP - General (Internal Medicine) Shamica Moree Johnney OuX Cassia Fein, NP as Nurse Practitioner (Nurse Practitioner) [facility] M Harle StanfordKimbrough Jr., MD as Consulting Physician (Urology) Rachael Feeaniel P Jacobs, MD as Consulting Physician (Gastroenterology) Salvatore Marvelobert Wainer, MD as Consulting Physician (Orthopedic Surgery) Venancio PoissonLaura Lomax, MD as Consulting Physician (Dermatology) Sheral Apleyimothy D Murphy, MD as Attending Physician (Orthopedic Surgery)  Extended Emergency Contact Information Primary Emergency Contact: Cook,[name] Address: [address]          [place], [place] [address] [place] Home Phone: [phone] Work Phone: [phone] Mobile Phone: [phone] Relation: Son Secondary Emergency Contact: Perno,[name] Address: [address]          [place], [place] [address] [place] Home Phone: [phone] Relation: Relative  Code Status:  DNR Goals of care: Advanced Directive information Advanced Directives 04/01/2016  Does patient have an advance directive? Yes  Type of Advance Directive Living will;Healthcare Power of [place];Out of facility DNR (pink MOST or yellow form)  Does patient want to make changes to advanced directive? No - Patient declined  Copy of advanced directive(s) in chart? Yes  Pre-existing out of facility DNR order (yellow form or pink MOST form) -     Chief Complaint  Patient presents with  . Acute Visit    Skin tear(rt) forearm    HPI:  Pt is a 82 y.o. female seen today for an acute visit for Right forearm skin tear, well approximated, no s/s of infection.    Hx of hypothyroidism, last TSH wnl 12/2014 off Levothyroxine 12..5mcg. Her blood sugar is controlled on diet, last Hgb A1c 5.8 12/2014. Blood pressure and heart rate are controlled off metoprolol. Her mood is stable on  Lexapro 10mg  , dementia requires SNF [facility] for care needs, her urinary incontinent is chronic and managed with adult depends. She has BM daily. She is still able to feed self at meals. FTT persisted. Healed left superior pubic ramus fx at the acetabulum hairline extension into the hip joint.    Past Medical History:  Diagnosis Date  . Abnormality of gait 09/08/2003  . AF (atrial fibrillation) (HCC)   . Anxiety state, unspecified 08/19/2010  . Aortic stenosis   . Atrial fibrillation (HCC) 07/14/2009   Qualifier: Diagnosis of  By: Graciela HusbandsKlein, MD, Ruthann CancerFACC, Ty HiltsSteven Cochran   . Bradycardia   . Cardiac pacemaker in situ 09/12/2004  . Closed fracture of lumbar vertebra without mention of spinal cord injury 107/28/2010  . Closed fracture of unspecified trochanteric section of femur 05/29/2011  . Constipation 03/14/2014  . Dementia 2013   04/03/14 MMSE 22/30   . Depression 04/10/2014  . Diffuse cystic mastopathy 12/23/2009  . Disturbance of salivary secretion [id number]  . Edema 08/18/2003  . Fall 12/19/2012   07/22/14 fall VS 144/76, 60, 20, 97.1. No apparent injury   . Flatulence, eructation, and gas pain 12/03/2009  . Fracture of greater trochanter of left femur (HCC) 03/08/14  . FTT (failure to thrive) in adult 12/15/2014  . Hearing loss   . Hemorrhoids   . Hip fracture (HCC)   . HTN (hypertension)   . Hypothyroidism 12/19/2007   Qualifier: Diagnosis of  By: Melvyn NethLewis CMA (AAMA), Patty  01/16/14 TSH 2.474 03/24/14 TSH 2.576    . 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 Loss of weight 10/13/2014   Multiple factorials: dementia, depression, advanced age, possible AR of medications(metformin and Oxybutynin)-will increase Mirtazapine and continue supplement. Observe.    . Macular degeneration (senile) of retina, unspecified 12/12/2001  . Macular degeneration of both eyes 01/24/2013   Severe visual impairment. Nearly blind.   . Mitral valve prolapse   . Occlusion and stenosis of  carotid artery without mention of cerebral infarction 12/27/2007  . Other and unspecified hyperlipidemia   . Other malaise and fatigue 12/06/2007  . Pacemaker-dual  medtronic 09/07/2011  . Personal history of fall 01/29/2009  . Rectal bleeding   . Senile osteoporosis 12/13/2000  . Sinus node dysfunction (HCC)   . Syncope and collapse 12/27/2007  . Trochanteric fracture of left femur (HCC) 03/14/2014   CT scan did however reveal acute fracture in greater left trochanteric. Dr. Margarita Rana consulted. Recommended nonoperative management and weightbearing as tolerated and another 3 weeks of Lovenox for DVT prophylaxis.       . Type 2 diabetes mellitus with blindness, with macular edema, with severe nonproliferative retinopathy 12/19/2007   Qualifier: Diagnosis of  By: Melvyn Neth CMA (AAMA), Patty  01/09/14 Hgb A1c 6.5 01/16/14 Hgb A1c 6.5 03/24/14 Hgb 6.4 12/10/14 pharm: dc Metformin and CBG monitoring-failure to thrive and refusing to eat.     Marland Kitchen Unspecified hereditary and idiopathic peripheral neuropathy 10/28/202012  . Unspecified hypothyroidism 03/18/2010  . Unspecified urinary incontinence 02/22/2005  . Urinary tract infection, site not specified 04/10/2014  . Xerophthalmus 01/21/2015   Past Surgical History:  Procedure Laterality Date  . BREAST BIOPSY Left 1970   benign  . Left hip surgery  09/2005  . PACEMAKER INSERTION  2006   Medtronic In-Pulse  . Right hip surgery  08/2003  . TONSILLECTOMY    . 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 Codeine     unknown  . Ibuprofen     unknown  . Lisinopril     unknown  . Penicillins     unknown  . Sanctura [Trospium Chloride]     unknown      Medication List       Accurate as of 04/01/16 12:29 PM. Always use your most recent med list.          atropine 1 % ophthalmic solution Place 4 drops every 2 hours as needed for excess secretions     bisacodyl 10 MG suppository Commonly known as:  DULCOLAX Place 10 mg rectally as needed for moderate constipation.   escitalopram 10 MG tablet Commonly known as:  LEXAPRO Take 10 mg by mouth daily.   LORazepam 1 MG tablet Commonly known as:  ATIVAN Take 1 mg by mouth every 2 (two) hours as needed for anxiety.   morphine 20 MG/ML concentrated solution Commonly known as:  ROXANOL Give 0.78ml (5mg ) by mouth every two hours as needed   sennosides-docusate sodium 8.6-50 MG tablet Commonly known as:  SENOKOT-S Take 2 tablets by mouth at bedtime. Reported on 12/11/2015   TYLENOL 325 MG tablet Generic drug:  acetaminophen Take 650 mg by mouth every 4 (four) hours as needed for mild pain or moderate pain.       Review of Systems  Constitutional: Negative for chills and fever.       Emaciated, weight has been stabilized past 3-4 months  HENT: Positive for hearing loss. Negative for  congestion, ear discharge and ear pain.   Eyes: Negative for pain, discharge and redness.       Low vision Dry eyes  Respiratory: Negative for cough, shortness of breath, wheezing and stridor.        Occasional hacking cough, non productive, no O2 desat, afebrile.  Cardiovascular: Negative for chest pain, palpitations and leg swelling.  Gastrointestinal: Negative for abdominal pain, constipation, nausea and vomiting.  Genitourinary: Positive for frequency. Negative for dysuria and urgency.  Musculoskeletal: Positive for arthralgias and gait problem. Negative for back pain, myalgias and neck pain.       Left hip/leg pain.   Skin: Negative for rash.       Right forearm skin tear, well approximated, no s/s of infection.   Neurological: Negative for dizziness, tremors, seizures, weakness and headaches.  Hematological: Negative.   Psychiatric/Behavioral: Positive for confusion and decreased concentration. Negative for hallucinations and suicidal ideas. The patient is nervous/anxious.     Immunization  History  Administered Date(s) Administered  . Influenza-Unspecified 06/06/2013, 04/21/2015  . PPD Test 07/23/2011  . Pneumococcal Polysaccharide-23 03/14/2009  . Td 03/14/2009  . Zoster 06/29/2006   Pertinent  Health Maintenance Due  Topic Date Due  . OPHTHALMOLOGY EXAM  07/[birth year]/1927  . URINE MICROALBUMIN  07/[birth year]1927  . DEXA SCAN  07/27/1982  . PNA vac Low Risk Adult (2 of 2 - PCV13) 03/14/2010  . HEMOGLOBIN A1C  07/29/2015  . FOOT EXAM  01/26/2016  . INFLUENZA VACCINE  03/01/2016   Fall Risk  01/26/2015 03/06/2014  Falls in the past year? Yes Yes  Number falls in past yr: 1 1  Injury with Fall? Yes Yes  Risk Factor Category  - High Fall Risk  Risk for fall due to : History of fall(s);Impaired balance/gait;Impaired mobility -  Follow up Falls evaluation completed -   Functional Status Survey:    Vitals:   04/01/16 1055  BP: 108/60  Pulse: (!) 58  Resp: 20  Temp: (!) 96.5 F (35.8 C)  SpO2: 96%  Weight: 97 lb 12.8 oz (44.4 kg)  Height: 5\' 2"  (1.575 m)   Body mass index is 17.89 kg/m. Physical Exam  Constitutional:  Extremely thin  HENT:  Head: Normocephalic and atraumatic.  Right Ear: External ear normal.  Left Ear: External ear normal.  Nose: Nose normal.  Mouth/Throat: Oropharynx is clear and moist. No oropharyngeal exudate.  Eyes: Conjunctivae and EOM are normal. Pupils are equal, round, and reactive to light. Right eye exhibits no discharge. Left eye exhibits no discharge. No scleral icterus.  Dry eyes  Neck: Normal range of motion. Neck supple. No JVD present. No thyromegaly present.  Cardiovascular: Normal rate.   Murmur heard. Pacemaker. Systolic ejection murmur 3/6  Pulmonary/Chest: Effort normal and breath sounds normal. No respiratory distress. She has no wheezes. She has no rales. She exhibits no tenderness.  Abdominal: Soft. Bowel sounds are normal. She exhibits no distension. There is no tenderness. There is no rebound.  Musculoskeletal: Normal range  of motion. She exhibits tenderness. She exhibits no edema.  Left hip and leg pain  Lymphadenopathy:    She has no cervical adenopathy.  Neurological: She is alert. She has normal reflexes. No cranial nerve deficit. She exhibits normal muscle tone. Coordination normal.  Skin: Skin is warm and dry. No rash noted. She is not diaphoretic. No erythema. No pallor.  Right forearm skin tear, well approximated, no s/s of infection.     Psychiatric: Thought content normal. Her mood appears not  anxious. Her affect is not angry, not blunt, not labile and not inappropriate. Her speech is not rapid and/or pressured, not delayed and not slurred. She is slowed. She is not agitated, not aggressive, not hyperactive, not withdrawn, not actively hallucinating and not combative. Thought content is not paranoid and not delusional. Cognition and memory are impaired. She expresses impulsivity and inappropriate judgment. She exhibits a depressed mood. She exhibits abnormal recent memory.  Confusion, HOH, very low vision. Guards against examination. She is attentive.    Labs reviewed: No results for input(s): NA, K, CL, CO2, GLUCOSE, BUN, CREATININE, CALCIUM, MG, PHOS in the last 8760 hours. No results for input(s): AST, ALT, ALKPHOS, BILITOT, PROT, ALBUMIN in the last 8760 hours. No results for input(s): WBC, NEUTROABS, HGB, HCT, MCV, PLT in the last 8760 hours. Lab Results  Component Value Date   TSH 3.55 01/27/2015   Lab Results  Component Value Date   HGBA1C 5.8 01/27/2015   Lab Results  Component Value Date   CHOL 140 12/20/2012   HDL 38 12/20/2012   LDLCALC 57 12/20/2012   TRIG 227 (A) 12/20/2012   CHOLHDL 5.0 12/22/2007    Significant Diagnostic Results in last 30 days:  No results found.  Assessment/Plan There are no diagnoses linked to this encounter.Essential hypertension Normalized, off Metoprolol.   ATRIAL FIBRILLATION Heart rate is in control, off meds.   Constipation Stable, continue  Senokot S II qhs  Hypothyroidism Off Levothyroxine  Type 2 diabetes mellitus with blindness, severe nonproliferative retinopathy, and macular edema Diet controlled  Dementia 04/03/14 MMSE 22/30.  Comfort measures only, stabilized    Depression Mood is stable, continue Lexapro 10mg  daily  FTT (failure to thrive) in adult Persists, supportive care     Family/ staff Communication: continue SNF for care assistance  Labs/tests ordered: none

## 2016-04-01 NOTE — Assessment & Plan Note (Signed)
Persists, supportive care 

## 2016-04-01 NOTE — Assessment & Plan Note (Signed)
04/03/14 MMSE 22/30.  Comfort measures only, stabilized  

## 2016-04-01 NOTE — Assessment & Plan Note (Signed)
Off Levothyroxine

## 2016-04-01 NOTE — Assessment & Plan Note (Signed)
Diet controlled.  

## 2016-04-01 NOTE — Assessment & Plan Note (Signed)
Normalized, off Metoprolol.  

## 2016-04-01 NOTE — Assessment & Plan Note (Signed)
Mood is stable, continue Lexapro 10mg daily   

## 2016-04-01 NOTE — Assessment & Plan Note (Signed)
Heart rate is in control, off meds.  

## 2016-04-01 NOTE — Assessment & Plan Note (Signed)
Stable, continue Senokot S II qhs. 

## 2016-04-05 DIAGNOSIS — E46 Unspecified protein-calorie malnutrition: Secondary | ICD-10-CM | POA: Diagnosis not present

## 2016-04-05 DIAGNOSIS — F0151 Vascular dementia with behavioral disturbance: Secondary | ICD-10-CM | POA: Diagnosis not present

## 2016-04-05 DIAGNOSIS — I495 Sick sinus syndrome: Secondary | ICD-10-CM | POA: Diagnosis not present

## 2016-04-05 DIAGNOSIS — R630 Anorexia: Secondary | ICD-10-CM | POA: Diagnosis not present

## 2016-04-05 DIAGNOSIS — R634 Abnormal weight loss: Secondary | ICD-10-CM | POA: Diagnosis not present

## 2016-04-05 DIAGNOSIS — I4891 Unspecified atrial fibrillation: Secondary | ICD-10-CM | POA: Diagnosis not present

## 2016-04-11 DIAGNOSIS — F0151 Vascular dementia with behavioral disturbance: Secondary | ICD-10-CM | POA: Diagnosis not present

## 2016-04-11 DIAGNOSIS — R634 Abnormal weight loss: Secondary | ICD-10-CM | POA: Diagnosis not present

## 2016-04-11 DIAGNOSIS — E46 Unspecified protein-calorie malnutrition: Secondary | ICD-10-CM | POA: Diagnosis not present

## 2016-04-11 DIAGNOSIS — R630 Anorexia: Secondary | ICD-10-CM | POA: Diagnosis not present

## 2016-04-11 DIAGNOSIS — I495 Sick sinus syndrome: Secondary | ICD-10-CM | POA: Diagnosis not present

## 2016-04-11 DIAGNOSIS — I4891 Unspecified atrial fibrillation: Secondary | ICD-10-CM | POA: Diagnosis not present

## 2016-04-12 DIAGNOSIS — I4891 Unspecified atrial fibrillation: Secondary | ICD-10-CM | POA: Diagnosis not present

## 2016-04-12 DIAGNOSIS — R630 Anorexia: Secondary | ICD-10-CM | POA: Diagnosis not present

## 2016-04-12 DIAGNOSIS — R634 Abnormal weight loss: Secondary | ICD-10-CM | POA: Diagnosis not present

## 2016-04-12 DIAGNOSIS — F0151 Vascular dementia with behavioral disturbance: Secondary | ICD-10-CM | POA: Diagnosis not present

## 2016-04-12 DIAGNOSIS — E46 Unspecified protein-calorie malnutrition: Secondary | ICD-10-CM | POA: Diagnosis not present

## 2016-04-12 DIAGNOSIS — I495 Sick sinus syndrome: Secondary | ICD-10-CM | POA: Diagnosis not present

## 2016-04-18 DIAGNOSIS — R630 Anorexia: Secondary | ICD-10-CM | POA: Diagnosis not present

## 2016-04-18 DIAGNOSIS — I495 Sick sinus syndrome: Secondary | ICD-10-CM | POA: Diagnosis not present

## 2016-04-18 DIAGNOSIS — I4891 Unspecified atrial fibrillation: Secondary | ICD-10-CM | POA: Diagnosis not present

## 2016-04-18 DIAGNOSIS — R634 Abnormal weight loss: Secondary | ICD-10-CM | POA: Diagnosis not present

## 2016-04-18 DIAGNOSIS — E46 Unspecified protein-calorie malnutrition: Secondary | ICD-10-CM | POA: Diagnosis not present

## 2016-04-18 DIAGNOSIS — F0151 Vascular dementia with behavioral disturbance: Secondary | ICD-10-CM | POA: Diagnosis not present

## 2016-04-20 DIAGNOSIS — E46 Unspecified protein-calorie malnutrition: Secondary | ICD-10-CM | POA: Diagnosis not present

## 2016-04-20 DIAGNOSIS — R630 Anorexia: Secondary | ICD-10-CM | POA: Diagnosis not present

## 2016-04-20 DIAGNOSIS — I4891 Unspecified atrial fibrillation: Secondary | ICD-10-CM | POA: Diagnosis not present

## 2016-04-20 DIAGNOSIS — F0151 Vascular dementia with behavioral disturbance: Secondary | ICD-10-CM | POA: Diagnosis not present

## 2016-04-20 DIAGNOSIS — I495 Sick sinus syndrome: Secondary | ICD-10-CM | POA: Diagnosis not present

## 2016-04-20 DIAGNOSIS — R634 Abnormal weight loss: Secondary | ICD-10-CM | POA: Diagnosis not present

## 2016-04-25 DIAGNOSIS — I495 Sick sinus syndrome: Secondary | ICD-10-CM | POA: Diagnosis not present

## 2016-04-25 DIAGNOSIS — E46 Unspecified protein-calorie malnutrition: Secondary | ICD-10-CM | POA: Diagnosis not present

## 2016-04-25 DIAGNOSIS — R630 Anorexia: Secondary | ICD-10-CM | POA: Diagnosis not present

## 2016-04-25 DIAGNOSIS — I4891 Unspecified atrial fibrillation: Secondary | ICD-10-CM | POA: Diagnosis not present

## 2016-04-25 DIAGNOSIS — R634 Abnormal weight loss: Secondary | ICD-10-CM | POA: Diagnosis not present

## 2016-04-25 DIAGNOSIS — F0151 Vascular dementia with behavioral disturbance: Secondary | ICD-10-CM | POA: Diagnosis not present

## 2016-04-29 DIAGNOSIS — F0151 Vascular dementia with behavioral disturbance: Secondary | ICD-10-CM | POA: Diagnosis not present

## 2016-04-29 DIAGNOSIS — R630 Anorexia: Secondary | ICD-10-CM | POA: Diagnosis not present

## 2016-04-29 DIAGNOSIS — I495 Sick sinus syndrome: Secondary | ICD-10-CM | POA: Diagnosis not present

## 2016-04-29 DIAGNOSIS — I4891 Unspecified atrial fibrillation: Secondary | ICD-10-CM | POA: Diagnosis not present

## 2016-04-29 DIAGNOSIS — E46 Unspecified protein-calorie malnutrition: Secondary | ICD-10-CM | POA: Diagnosis not present

## 2016-04-29 DIAGNOSIS — R634 Abnormal weight loss: Secondary | ICD-10-CM | POA: Diagnosis not present

## 2016-05-01 DIAGNOSIS — R634 Abnormal weight loss: Secondary | ICD-10-CM | POA: Diagnosis not present

## 2016-05-01 DIAGNOSIS — E785 Hyperlipidemia, unspecified: Secondary | ICD-10-CM | POA: Diagnosis not present

## 2016-05-01 DIAGNOSIS — R630 Anorexia: Secondary | ICD-10-CM | POA: Diagnosis not present

## 2016-05-01 DIAGNOSIS — I4891 Unspecified atrial fibrillation: Secondary | ICD-10-CM | POA: Diagnosis not present

## 2016-05-01 DIAGNOSIS — H3530 Unspecified macular degeneration: Secondary | ICD-10-CM | POA: Diagnosis not present

## 2016-05-01 DIAGNOSIS — E1159 Type 2 diabetes mellitus with other circulatory complications: Secondary | ICD-10-CM | POA: Diagnosis not present

## 2016-05-01 DIAGNOSIS — M84359S Stress fracture, hip, unspecified, sequela: Secondary | ICD-10-CM | POA: Diagnosis not present

## 2016-05-01 DIAGNOSIS — I10 Essential (primary) hypertension: Secondary | ICD-10-CM | POA: Diagnosis not present

## 2016-05-01 DIAGNOSIS — F0151 Vascular dementia with behavioral disturbance: Secondary | ICD-10-CM | POA: Diagnosis not present

## 2016-05-01 DIAGNOSIS — E039 Hypothyroidism, unspecified: Secondary | ICD-10-CM | POA: Diagnosis not present

## 2016-05-01 DIAGNOSIS — F339 Major depressive disorder, recurrent, unspecified: Secondary | ICD-10-CM | POA: Diagnosis not present

## 2016-05-01 DIAGNOSIS — E46 Unspecified protein-calorie malnutrition: Secondary | ICD-10-CM | POA: Diagnosis not present

## 2016-05-01 DIAGNOSIS — I495 Sick sinus syndrome: Secondary | ICD-10-CM | POA: Diagnosis not present

## 2016-05-02 ENCOUNTER — Non-Acute Institutional Stay (SKILLED_NURSING_FACILITY): Payer: Medicare Other | Admitting: Nurse Practitioner

## 2016-05-02 ENCOUNTER — Encounter: Payer: Self-pay | Admitting: Nurse Practitioner

## 2016-05-02 DIAGNOSIS — R627 Adult failure to thrive: Secondary | ICD-10-CM | POA: Diagnosis not present

## 2016-05-02 DIAGNOSIS — F329 Major depressive disorder, single episode, unspecified: Secondary | ICD-10-CM

## 2016-05-02 DIAGNOSIS — K5900 Constipation, unspecified: Secondary | ICD-10-CM | POA: Diagnosis not present

## 2016-05-02 DIAGNOSIS — I10 Essential (primary) hypertension: Secondary | ICD-10-CM

## 2016-05-02 DIAGNOSIS — I4891 Unspecified atrial fibrillation: Secondary | ICD-10-CM

## 2016-05-02 DIAGNOSIS — E032 Hypothyroidism due to medicaments and other exogenous substances: Secondary | ICD-10-CM

## 2016-05-02 DIAGNOSIS — F32A Depression, unspecified: Secondary | ICD-10-CM

## 2016-05-02 NOTE — Assessment & Plan Note (Signed)
Heart rate is in control, off meds.  

## 2016-05-02 NOTE — Assessment & Plan Note (Signed)
Normalized, off Metoprolol.  

## 2016-05-02 NOTE — Assessment & Plan Note (Addendum)
Persists, supportive care, prn Morphine, Ativan, Tylenol available to her.  

## 2016-05-02 NOTE — Assessment & Plan Note (Signed)
Mood is managed on  Lexapro 15mg  daily

## 2016-05-02 NOTE — Assessment & Plan Note (Signed)
Off Levothyroxine 

## 2016-05-02 NOTE — Assessment & Plan Note (Signed)
Stable, continue Senokot S II qhs. 

## 2016-05-02 NOTE — Assessment & Plan Note (Signed)
04/03/14 MMSE 22/30.  Comfort measures only, stabilized  

## 2016-05-02 NOTE — Progress Notes (Signed)
Location:  [facility] Room Number: 105 Place of Service:  SNF (31) Provider:  Adarrius Graeff, Manxie  NP [place] Hodgkins, MD  Patient Care Team: Kimber Relic, MD as PCP - General (Internal Medicine) Freddi Schrager Johnney Ou, NP as Nurse Practitioner (Nurse Practitioner) [facility] Harle Stanford., MD as Consulting Physician (Urology) Rachael Fee, MD as Consulting Physician (Gastroenterology) Salvatore Marvel, MD as Consulting Physician (Orthopedic Surgery) Venancio Poisson, MD as Consulting Physician (Dermatology) Sheral Apley, MD as Attending Physician (Orthopedic Surgery)  Extended Emergency Contact Information Primary Emergency Contact: Cook,[name] Address: [address]          [place], [place] [address] [place] Home Phone: [phone] Work Phone: [phone] Mobile Phone: [phone] Relation: Son Secondary Emergency Contact: Dobberstein,[name] Address: [address]          [place], [place] [address] [place] Home Phone: [phone] Relation: Relative    Code Status:  DNR Goals of care: Advanced Directive information Advanced Directives 05/02/2016  Does patient have an advance directive? Yes  Type of Estate agent of [place];Living will;Out of facility DNR (pink MOST or yellow form)  Does patient want to make changes to advanced directive? No - Patient declined  Copy of advanced directive(s) in chart? Yes  Pre-existing out of facility DNR order (yellow form or pink MOST form) -     Chief Complaint  Patient presents with  . Medical Management of Chronic Issues    HPI:  Pt is a 82 y.o. female seen today for medical management of chronic diseases.    Hx of hypothyroidism, last TSH wnl 12/2014 off Levothyroxine 12.. . Her blood sugar is controlled on diet, last Hgb A1c 5.8 12/2014. Blood pressure and heart rate are controlled off metoprolol. Her mood is managed on Lexapro 15mg  , dementia requires SNF MCU for  care needs, her urinary incontinent is chronic and managed with adult depends. She has BM daily. She is still able to feed self at meals. FTT persisted. Healed left superior pubic ramus fx at the acetabulum hairline extension into the hip joint.    Past Medical History:  Diagnosis Date  . Abnormality of gait 09/08/2003  . AF (atrial fibrillation) (HCC)   . Anxiety state, unspecified 08/19/2010  . Aortic stenosis   . Atrial fibrillation (HCC) 07/14/2009   Qualifier: Diagnosis of  By: [name], MD, Ruthann Cancer Ty Hilts   . Bradycardia   . Cardiac pacemaker in situ 09/12/2004  . Closed fracture of lumbar vertebra without mention of spinal cord injury 1January 18, 202010  . Closed fracture of unspecified trochanteric section of femur 05/29/2011  . Constipation 03/14/2014  . Dementia 2013   04/03/14 MMSE 22/30   . Depression 04/10/2014  . Diffuse cystic mastopathy 12/23/2009  . Disturbance of salivary secretion [id number]  . Edema 08/18/2003  . Fall 12/19/2012   07/22/14 fall VS 144/76, 60, 20, 97.1. No apparent injury   . Flatulence, eructation, and gas pain 12/03/2009  . Fracture of greater trochanter of left femur (HCC) 03/08/14  . FTT (failure to thrive) in adult 12/15/2014  . Hearing loss   . Hemorrhoids   . Hip fracture (HCC)   . HTN (hypertension)   . Hypothyroidism 12/19/2007   Qualifier: Diagnosis of  By: Melvyn Neth CMA (AAMA), Patty  01/16/14 TSH 2.474 03/24/14 TSH 2.576    . 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 Loss of weight 10/13/2014  Multiple factorials: dementia, depression, advanced age, possible AR of medications(metformin and Oxybutynin)-will increase Mirtazapine and continue supplement. Observe.    . Macular degeneration (senile) of retina, unspecified 12/12/2001  . Macular degeneration of both eyes 01/24/2013   Severe visual impairment. Nearly blind.   . Mitral valve prolapse   . Occlusion and stenosis of carotid artery without mention of cerebral  infarction 12/27/2007  . Other and unspecified hyperlipidemia   . Other malaise and fatigue 12/06/2007  . Pacemaker-dual  medtronic 09/07/2011  . Personal history of fall 01/29/2009  . Rectal bleeding   . Senile osteoporosis 12/13/2000  . Sinus node dysfunction (HCC)   . Syncope and collapse 12/27/2007  . Trochanteric fracture of left femur (HCC) 03/14/2014   CT scan did however reveal acute fracture in greater left trochanteric. Dr. Margarita Ranaimothy [place] consulted. Recommended nonoperative management and weightbearing as tolerated and another 3 weeks of Lovenox for DVT prophylaxis.       . Type 2 diabetes mellitus with blindness, with macular edema, with severe nonproliferative retinopathy 12/19/2007   Qualifier: Diagnosis of  By: Melvyn NethLewis CMA (AAMA), Patty  01/09/14 Hgb A1c 6.5 01/16/14 Hgb A1c 6.5 03/24/14 Hgb 6.4 12/10/14 pharm: dc Metformin and CBG monitoring-failure to thrive and refusing to eat.     Marland Kitchen. Unspecified hereditary and idiopathic peripheral neuropathy 10/28/202012  . Unspecified hypothyroidism 03/18/2010  . Unspecified urinary incontinence 02/22/2005  . Urinary tract infection, site not specified 04/10/2014  . Xerophthalmus 01/21/2015   Past Surgical History:  Procedure Laterality Date  . BREAST BIOPSY Left 1970   benign  . Left hip surgery  09/2005  . PACEMAKER INSERTION  2006   Medtronic In-Pulse  . Right hip surgery  08/2003  . TONSILLECTOMY    . 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 Codeine     unknown  . Ibuprofen     unknown  . Lisinopril     unknown  . Penicillins     unknown  . Sanctura [Trospium Chloride]     unknown      Medication List       Accurate as of 05/02/16  3:38 PM. Always use your most recent med list.          atropine 1 % ophthalmic solution Place 4 drops every 2 hours as needed for excess secretions   bisacodyl 10 MG suppository Commonly known as:   DULCOLAX Place 10 mg rectally as needed for moderate constipation.   escitalopram 10 MG tablet Commonly known as:  LEXAPRO Take 10 mg by mouth daily.   LORazepam 1 MG tablet Commonly known as:  ATIVAN Take 1 mg by mouth every 2 (two) hours as needed for anxiety.   morphine 20 MG/ML concentrated solution Commonly known as:  ROXANOL Give 0.6525ml (5mg ) by mouth every two hours as needed   sennosides-docusate sodium 8.6-50 MG tablet Commonly known as:  SENOKOT-S Take 2 tablets by mouth at bedtime. Reported on 12/11/2015   TYLENOL 325 MG tablet Generic drug:  acetaminophen Take 650 mg by mouth every 4 (four) hours as needed for mild pain or moderate pain.       Review of Systems  Constitutional: Negative for chills and fever.       Emaciated, weight has been stabilized past 3-4 months  HENT: Positive for hearing loss. Negative for congestion, ear discharge and ear pain.   Eyes: Negative for pain,  discharge and redness.       Low vision Dry eyes  Respiratory: Negative for cough, shortness of breath, wheezing and stridor.        Occasional hacking cough, non productive, no O2 desat, afebrile.  Cardiovascular: Negative for chest pain, palpitations and leg swelling.  Gastrointestinal: Negative for abdominal pain, constipation, nausea and vomiting.  Genitourinary: Positive for frequency. Negative for dysuria and urgency.  Musculoskeletal: Positive for arthralgias and gait problem. Negative for back pain, myalgias and neck pain.       Left hip/leg pain.   Skin: Negative for rash.       Right forearm skin tear, well approximated, no s/s of infection.   Neurological: Negative for dizziness, tremors, seizures, weakness and headaches.  Hematological: Negative.   Psychiatric/Behavioral: Positive for confusion and decreased concentration. Negative for hallucinations and suicidal ideas. The patient is nervous/anxious.     Immunization History  Administered Date(s) Administered  .  Influenza-Unspecified 06/06/2013, 04/21/2015  . PPD Test 07/23/2011  . Pneumococcal Polysaccharide-23 03/14/2009  . Td 03/14/2009  . Zoster 06/29/2006   Pertinent  Health Maintenance Due  Topic Date Due  . OPHTHALMOLOGY EXAM  07/28/1927  . URINE MICROALBUMIN  07/28/1927  . DEXA SCAN  07/27/1982  . PNA vac Low Risk Adult (2 of 2 - PCV13) 03/14/2010  . HEMOGLOBIN A1C  07/29/2015  . FOOT EXAM  01/26/2016  . INFLUENZA VACCINE  03/01/2016   Fall Risk  01/26/2015 03/06/2014  Falls in the past year? Yes Yes  Number falls in past yr: 1 1  Injury with Fall? Yes Yes  Risk Factor Category  - High Fall Risk  Risk for fall due to : History of fall(s);Impaired balance/gait;Impaired mobility -  Follow up Falls evaluation completed -   Functional Status Survey:    Vitals:   05/02/16 1508  BP: (!) 150/70  Pulse: 60  Resp: 18  Temp: 97.8 F (36.6 C)  Weight: 89 lb 6.4 oz (40.6 kg)  Height: 5\' 2"  (1.575 m)   Body mass index is 16.35 kg/m. Physical Exam  Constitutional:  Extremely thin  HENT:  Head: Normocephalic and atraumatic.  Right Ear: External ear normal.  Left Ear: External ear normal.  Nose: Nose normal.  Mouth/Throat: Oropharynx is clear and moist. No oropharyngeal exudate.  Eyes: Conjunctivae and EOM are normal. Pupils are equal, round, and reactive to light. Right eye exhibits no discharge. Left eye exhibits no discharge. No scleral icterus.  Dry eyes  Neck: Normal range of motion. Neck supple. No JVD present. No thyromegaly present.  Cardiovascular: Normal rate.   Murmur heard. Pacemaker. Systolic ejection murmur 3/6  Pulmonary/Chest: Effort normal and breath sounds normal. No respiratory distress. She has no wheezes. She has no rales. She exhibits no tenderness.  Abdominal: Soft. Bowel sounds are normal. She exhibits no distension. There is no tenderness. There is no rebound.  Musculoskeletal: Normal range of motion. She exhibits tenderness. She exhibits no edema.  Left  hip and leg pain  Lymphadenopathy:    She has no cervical adenopathy.  Neurological: She is alert. She has normal reflexes. No cranial nerve deficit. She exhibits normal muscle tone. Coordination normal.  Skin: Skin is warm and dry. No rash noted. She is not diaphoretic. No erythema. No pallor.  Right forearm skin tear, well approximated, no s/s of infection.     Psychiatric: Thought content normal. Her mood appears not anxious. Her affect is not angry, not blunt, not labile and not inappropriate. Her speech is  not rapid and/or pressured, not delayed and not slurred. She is slowed. She is not agitated, not aggressive, not hyperactive, not withdrawn, not actively hallucinating and not combative. Thought content is not paranoid and not delusional. Cognition and memory are impaired. She expresses impulsivity and inappropriate judgment. She exhibits a depressed mood. She exhibits abnormal recent memory.  Confusion, HOH, very low vision. Guards against examination. She is attentive.    Labs reviewed: No results for input(s): NA, K, CL, CO2, GLUCOSE, BUN, CREATININE, CALCIUM, MG, PHOS in the last 8760 hours. No results for input(s): AST, ALT, ALKPHOS, BILITOT, PROT, ALBUMIN in the last 8760 hours. No results for input(s): WBC, NEUTROABS, HGB, HCT, MCV, PLT in the last 8760 hours. Lab Results  Component Value Date   TSH 3.55 01/27/2015   Lab Results  Component Value Date   HGBA1C 5.8 01/27/2015   Lab Results  Component Value Date   CHOL 140 12/20/2012   HDL 38 12/20/2012   LDLCALC 57 12/20/2012   TRIG 227 (A) 12/20/2012   CHOLHDL 5.0 12/22/2007    Significant Diagnostic Results in last 30 days:  No results found.  Assessment/Plan Depression Mood is managed on  Lexapro 15mg  daily   FTT (failure to thrive) in adult Persists, supportive care, prn Morphine, Ativan, Tylenol available to her.    Constipation Stable, continue Senokot S II qhs   Essential hypertension Normalized,  off Metoprolol.   ATRIAL FIBRILLATION Heart rate is in control, off meds.   Hypothyroidism Off Levothyroxine   Dementia 04/03/14 MMSE 22/30.  Comfort measures only, stabilized        Family/ staff Communication: continue SNF, Hospice for care needs.   Labs/tests ordered:  none

## 2016-05-03 DIAGNOSIS — I4891 Unspecified atrial fibrillation: Secondary | ICD-10-CM | POA: Diagnosis not present

## 2016-05-03 DIAGNOSIS — I495 Sick sinus syndrome: Secondary | ICD-10-CM | POA: Diagnosis not present

## 2016-05-03 DIAGNOSIS — R634 Abnormal weight loss: Secondary | ICD-10-CM | POA: Diagnosis not present

## 2016-05-03 DIAGNOSIS — F0151 Vascular dementia with behavioral disturbance: Secondary | ICD-10-CM | POA: Diagnosis not present

## 2016-05-03 DIAGNOSIS — R630 Anorexia: Secondary | ICD-10-CM | POA: Diagnosis not present

## 2016-05-03 DIAGNOSIS — E46 Unspecified protein-calorie malnutrition: Secondary | ICD-10-CM | POA: Diagnosis not present

## 2016-05-06 DIAGNOSIS — F0151 Vascular dementia with behavioral disturbance: Secondary | ICD-10-CM | POA: Diagnosis not present

## 2016-05-06 DIAGNOSIS — E46 Unspecified protein-calorie malnutrition: Secondary | ICD-10-CM | POA: Diagnosis not present

## 2016-05-06 DIAGNOSIS — I495 Sick sinus syndrome: Secondary | ICD-10-CM | POA: Diagnosis not present

## 2016-05-06 DIAGNOSIS — R634 Abnormal weight loss: Secondary | ICD-10-CM | POA: Diagnosis not present

## 2016-05-06 DIAGNOSIS — I4891 Unspecified atrial fibrillation: Secondary | ICD-10-CM | POA: Diagnosis not present

## 2016-05-06 DIAGNOSIS — R630 Anorexia: Secondary | ICD-10-CM | POA: Diagnosis not present

## 2016-05-11 DIAGNOSIS — E46 Unspecified protein-calorie malnutrition: Secondary | ICD-10-CM | POA: Diagnosis not present

## 2016-05-11 DIAGNOSIS — R634 Abnormal weight loss: Secondary | ICD-10-CM | POA: Diagnosis not present

## 2016-05-11 DIAGNOSIS — I495 Sick sinus syndrome: Secondary | ICD-10-CM | POA: Diagnosis not present

## 2016-05-11 DIAGNOSIS — F0151 Vascular dementia with behavioral disturbance: Secondary | ICD-10-CM | POA: Diagnosis not present

## 2016-05-11 DIAGNOSIS — R630 Anorexia: Secondary | ICD-10-CM | POA: Diagnosis not present

## 2016-05-11 DIAGNOSIS — I4891 Unspecified atrial fibrillation: Secondary | ICD-10-CM | POA: Diagnosis not present

## 2016-05-17 DIAGNOSIS — R630 Anorexia: Secondary | ICD-10-CM | POA: Diagnosis not present

## 2016-05-17 DIAGNOSIS — E46 Unspecified protein-calorie malnutrition: Secondary | ICD-10-CM | POA: Diagnosis not present

## 2016-05-17 DIAGNOSIS — F0151 Vascular dementia with behavioral disturbance: Secondary | ICD-10-CM | POA: Diagnosis not present

## 2016-05-17 DIAGNOSIS — R634 Abnormal weight loss: Secondary | ICD-10-CM | POA: Diagnosis not present

## 2016-05-17 DIAGNOSIS — I495 Sick sinus syndrome: Secondary | ICD-10-CM | POA: Diagnosis not present

## 2016-05-17 DIAGNOSIS — I4891 Unspecified atrial fibrillation: Secondary | ICD-10-CM | POA: Diagnosis not present

## 2016-05-19 DIAGNOSIS — R634 Abnormal weight loss: Secondary | ICD-10-CM | POA: Diagnosis not present

## 2016-05-19 DIAGNOSIS — F0151 Vascular dementia with behavioral disturbance: Secondary | ICD-10-CM | POA: Diagnosis not present

## 2016-05-19 DIAGNOSIS — R630 Anorexia: Secondary | ICD-10-CM | POA: Diagnosis not present

## 2016-05-19 DIAGNOSIS — I4891 Unspecified atrial fibrillation: Secondary | ICD-10-CM | POA: Diagnosis not present

## 2016-05-19 DIAGNOSIS — E46 Unspecified protein-calorie malnutrition: Secondary | ICD-10-CM | POA: Diagnosis not present

## 2016-05-19 DIAGNOSIS — I495 Sick sinus syndrome: Secondary | ICD-10-CM | POA: Diagnosis not present

## 2016-05-23 DIAGNOSIS — R630 Anorexia: Secondary | ICD-10-CM | POA: Diagnosis not present

## 2016-05-23 DIAGNOSIS — R634 Abnormal weight loss: Secondary | ICD-10-CM | POA: Diagnosis not present

## 2016-05-23 DIAGNOSIS — F0151 Vascular dementia with behavioral disturbance: Secondary | ICD-10-CM | POA: Diagnosis not present

## 2016-05-23 DIAGNOSIS — I4891 Unspecified atrial fibrillation: Secondary | ICD-10-CM | POA: Diagnosis not present

## 2016-05-23 DIAGNOSIS — E46 Unspecified protein-calorie malnutrition: Secondary | ICD-10-CM | POA: Diagnosis not present

## 2016-05-23 DIAGNOSIS — I495 Sick sinus syndrome: Secondary | ICD-10-CM | POA: Diagnosis not present

## 2016-06-01 DIAGNOSIS — E1159 Type 2 diabetes mellitus with other circulatory complications: Secondary | ICD-10-CM | POA: Diagnosis not present

## 2016-06-01 DIAGNOSIS — R630 Anorexia: Secondary | ICD-10-CM | POA: Diagnosis not present

## 2016-06-01 DIAGNOSIS — I495 Sick sinus syndrome: Secondary | ICD-10-CM | POA: Diagnosis not present

## 2016-06-01 DIAGNOSIS — F0151 Vascular dementia with behavioral disturbance: Secondary | ICD-10-CM | POA: Diagnosis not present

## 2016-06-01 DIAGNOSIS — H3530 Unspecified macular degeneration: Secondary | ICD-10-CM | POA: Diagnosis not present

## 2016-06-01 DIAGNOSIS — M84359S Stress fracture, hip, unspecified, sequela: Secondary | ICD-10-CM | POA: Diagnosis not present

## 2016-06-01 DIAGNOSIS — R634 Abnormal weight loss: Secondary | ICD-10-CM | POA: Diagnosis not present

## 2016-06-01 DIAGNOSIS — I10 Essential (primary) hypertension: Secondary | ICD-10-CM | POA: Diagnosis not present

## 2016-06-01 DIAGNOSIS — I4891 Unspecified atrial fibrillation: Secondary | ICD-10-CM | POA: Diagnosis not present

## 2016-06-01 DIAGNOSIS — E785 Hyperlipidemia, unspecified: Secondary | ICD-10-CM | POA: Diagnosis not present

## 2016-06-01 DIAGNOSIS — E039 Hypothyroidism, unspecified: Secondary | ICD-10-CM | POA: Diagnosis not present

## 2016-06-01 DIAGNOSIS — E46 Unspecified protein-calorie malnutrition: Secondary | ICD-10-CM | POA: Diagnosis not present

## 2016-06-01 DIAGNOSIS — F339 Major depressive disorder, recurrent, unspecified: Secondary | ICD-10-CM | POA: Diagnosis not present

## 2016-06-02 ENCOUNTER — Non-Acute Institutional Stay (SKILLED_NURSING_FACILITY): Payer: Medicare Other | Admitting: Nurse Practitioner

## 2016-06-02 ENCOUNTER — Encounter: Payer: Self-pay | Admitting: Nurse Practitioner

## 2016-06-02 DIAGNOSIS — I10 Essential (primary) hypertension: Secondary | ICD-10-CM

## 2016-06-02 DIAGNOSIS — R627 Adult failure to thrive: Secondary | ICD-10-CM

## 2016-06-02 DIAGNOSIS — K5900 Constipation, unspecified: Secondary | ICD-10-CM | POA: Diagnosis not present

## 2016-06-02 DIAGNOSIS — L03115 Cellulitis of right lower limb: Secondary | ICD-10-CM | POA: Diagnosis not present

## 2016-06-02 DIAGNOSIS — F0150 Vascular dementia without behavioral disturbance: Secondary | ICD-10-CM | POA: Diagnosis not present

## 2016-06-02 DIAGNOSIS — I4819 Other persistent atrial fibrillation: Secondary | ICD-10-CM

## 2016-06-02 DIAGNOSIS — F320 Major depressive disorder, single episode, mild: Secondary | ICD-10-CM | POA: Diagnosis not present

## 2016-06-02 DIAGNOSIS — I481 Persistent atrial fibrillation: Secondary | ICD-10-CM | POA: Diagnosis not present

## 2016-06-02 DIAGNOSIS — R634 Abnormal weight loss: Secondary | ICD-10-CM | POA: Diagnosis not present

## 2016-06-02 DIAGNOSIS — I4891 Unspecified atrial fibrillation: Secondary | ICD-10-CM | POA: Diagnosis not present

## 2016-06-02 DIAGNOSIS — E46 Unspecified protein-calorie malnutrition: Secondary | ICD-10-CM | POA: Diagnosis not present

## 2016-06-02 DIAGNOSIS — I495 Sick sinus syndrome: Secondary | ICD-10-CM | POA: Diagnosis not present

## 2016-06-02 DIAGNOSIS — F0151 Vascular dementia with behavioral disturbance: Secondary | ICD-10-CM | POA: Diagnosis not present

## 2016-06-02 DIAGNOSIS — R630 Anorexia: Secondary | ICD-10-CM | POA: Diagnosis not present

## 2016-06-02 NOTE — Assessment & Plan Note (Signed)
04/03/14 MMSE 22/30.  Comfort measures only, continue SNF MCU, comfort care.

## 2016-06-02 NOTE — Assessment & Plan Note (Signed)
Stable, continue Senokot S II qhs, prn Bisacodyl 10mg  suppository available to her

## 2016-06-02 NOTE — Assessment & Plan Note (Signed)
Normalized, off Metoprolol.

## 2016-06-02 NOTE — Assessment & Plan Note (Signed)
Persists, supportive care, prn Morphine, Ativan, Tylenol available to her.

## 2016-06-02 NOTE — Assessment & Plan Note (Signed)
Heart rate is in control, off meds.  

## 2016-06-02 NOTE — Assessment & Plan Note (Signed)
Mood is managed on  Lexapro 10mg daily 

## 2016-06-02 NOTE — Assessment & Plan Note (Signed)
The right shin skin abrasion developed in to a 1/3 anterior lower leg erythema, fever, tenderness, yellow drainage, noted last night.  Apply Bactroban oint bid to affected area until healed Doxycycline 100mg  bid x 7 days.

## 2016-06-02 NOTE — Progress Notes (Signed)
Location:  [facility] Room Number: 105 Place of Service:  SNF (31) Provider:  Mast, Manxie  NP  Murray Hodgkins, MD  Patient Care Team: Kimber Relic, MD as PCP - General (Internal Medicine) Man Johnney Ou, NP as Nurse Practitioner (Nurse Practitioner) [facility] Harle Stanford., MD as Consulting Physician (Urology) Rachael Fee, MD as Consulting Physician (Gastroenterology) Salvatore Marvel, MD as Consulting Physician (Orthopedic Surgery) Venancio Poisson, MD as Consulting Physician (Dermatology) Sheral Apley, MD as Attending Physician (Orthopedic Surgery)  Extended Emergency Contact Information Primary Emergency Contact: Cook,[name] Address: [address]          [place], [place] [address] [place] Home Phone: [phone] Work Phone: [phone] Mobile Phone: [phone] Relation: Son Secondary Emergency Contact: Fialkowski,[name] Address: [address]          [place], [place] [address] [place] Home Phone: [phone] Relation: Relative  Code Status:  DNR Goals of care: Advanced Directive information Advanced Directives 06/02/2016  Does patient have an advance directive? Yes  Type of Estate agent of [employer];Living will;Out of facility DNR (pink MOST or yellow form)  Does patient want to make changes to advanced directive? No - Patient declined  Copy of advanced directive(s) in chart? Yes  Pre-existing out of facility DNR order (yellow form or pink MOST form) -     Chief Complaint  Patient presents with  . Medical Management of Chronic Issues    HPI:  Pt is a 82 y.o. female seen today for medical management of chronic diseases.    The right shin skin abrasion developed in to a 1/3 anterior lower leg erythema, fever, tenderness, yellow drainage, noted last night.    Hx of hypothyroidism, last TSH wnl 12/2014 off Levothyroxine 12.58mcg. Her blood sugar is controlled on diet, last Hgb A1c 5.8  12/2014. Blood pressure and heart rate are controlled offmetoprolol. Her mood is managed on Lexapro 10mg  , dementia requires SNF MCU for care needs, her urinary incontinent is chronic and managed with adult depends. She has BM daily. She is still able to feed self at meals. FTT persisted. Healed left superior pubic ramus fx at the acetabulum hairline extension into the hip joint. Her goal of care is comfort measures, prn Morphine, Lorazepam, Tylenol available to her.   Past Medical History:  Diagnosis Date  . Abnormality of gait 09/08/2003  . AF (atrial fibrillation) (HCC)   . Anxiety state, unspecified 08/19/2010  . Aortic stenosis   . Atrial fibrillation (HCC) 07/14/2009   Qualifier: Diagnosis of  By: [name], MD, Ruthann Cancer Ty Hilts   . Bradycardia   . Cardiac pacemaker in situ 09/12/2004  . Closed fracture of lumbar vertebra without mention of spinal cord injury 12020-07-309  . Closed fracture of unspecified trochanteric section of femur 05/29/2011  . Constipation 03/14/2014  . Dementia 2013   04/03/14 MMSE 22/30   . Depression 04/10/2014  . Diffuse cystic mastopathy 12/23/2009  . Disturbance of salivary secretion [id number]  . Edema 08/18/2003  . Fall 12/19/2012   07/22/14 fall VS 144/76, 60, 20, 97.1. No apparent injury   . Flatulence, eructation, and gas pain 12/03/2009  . Fracture of greater trochanter of left femur (HCC) 03/08/14  . FTT (failure to thrive) in adult 12/15/2014  . Hearing loss   . Hemorrhoids   . Hip fracture (HCC)   . HTN (hypertension)   . Hypothyroidism 12/19/2007   Qualifier: Diagnosis of  By: Melvyn Neth  CMA (AAMA), Patty  01/16/14 TSH 2.474 03/24/14 TSH 2.576    . 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 Loss of weight 10/13/2014   Multiple factorials: dementia, depression, advanced age, possible AR of medications(metformin and Oxybutynin)-will increase Mirtazapine and continue supplement. Observe.    . Macular degeneration (senile) of  retina, unspecified 12/12/2001  . Macular degeneration of both eyes 01/24/2013   Severe visual impairment. Nearly blind.   . Mitral valve prolapse   . Occlusion and stenosis of carotid artery without mention of cerebral infarction 12/27/2007  . Other and unspecified hyperlipidemia   . Other malaise and fatigue 12/06/2007  . Pacemaker-dual  medtronic 09/07/2011  . Personal history of fall 01/29/2009  . Rectal bleeding   . Senile osteoporosis 12/13/2000  . Sinus node dysfunction (HCC)   . Syncope and collapse 12/27/2007  . Trochanteric fracture of left femur (HCC) 03/14/2014   CT scan did however reveal acute fracture in greater left trochanteric. Dr. Margarita Rana consulted. Recommended nonoperative management and weightbearing as tolerated and another 3 weeks of Lovenox for DVT prophylaxis.       . Type 2 diabetes mellitus with blindness, with macular edema, with severe nonproliferative retinopathy 12/19/2007   Qualifier: Diagnosis of  By: Melvyn Neth CMA (AAMA), Patty  01/09/14 Hgb A1c 6.5 01/16/14 Hgb A1c 6.5 03/24/14 Hgb 6.4 12/10/14 pharm: dc Metformin and CBG monitoring-failure to thrive and refusing to eat.     Marland Kitchen Unspecified hereditary and idiopathic peripheral neuropathy 10/28/202012  . Unspecified hypothyroidism 03/18/2010  . Unspecified urinary incontinence 02/22/2005  . Urinary tract infection, site not specified 04/10/2014  . Xerophthalmus 01/21/2015   Past Surgical History:  Procedure Laterality Date  . BREAST BIOPSY Left 1970   benign  . Left hip surgery  09/2005  . PACEMAKER INSERTION  2006   Medtronic In-Pulse  . Right hip surgery  08/2003  . TONSILLECTOMY    . 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 Codeine     unknown  . Ibuprofen     unknown  . Lisinopril     unknown  . Penicillins     unknown  . Sanctura [Trospium Chloride]     unknown      Medication List       Accurate  as of 06/02/16  3:08 PM. Always use your most recent med list.          atropine 1 % ophthalmic solution Place 4 drops every 2 hours as needed for excess secretions   bisacodyl 10 MG suppository Commonly known as:  DULCOLAX Place 10 mg rectally as needed for moderate constipation.   escitalopram 10 MG tablet Commonly known as:  LEXAPRO Take 10 mg by mouth daily.   LORazepam 1 MG tablet Commonly known as:  ATIVAN Take 1 mg by mouth every 2 (two) hours as needed for anxiety.   morphine 20 MG/ML concentrated solution Commonly known as:  ROXANOL Give 0.77ml (5mg ) by mouth every two hours as needed   sennosides-docusate sodium 8.6-50 MG tablet Commonly known as:  SENOKOT-S Take 2 tablets by mouth at bedtime. Reported on 12/11/2015   TYLENOL 325 MG tablet Generic drug:  acetaminophen Take 650 mg by mouth every 4 (four) hours as needed for mild pain or moderate pain.       Review of Systems  Constitutional: Negative for chills and  fever.       Emaciated, weight has been stabilized past 3-4 months  HENT: Positive for hearing loss. Negative for congestion, ear discharge and ear pain.   Eyes: Negative for pain, discharge and redness.       Low vision Dry eyes  Respiratory: Negative for cough, shortness of breath, wheezing and stridor.        Occasional hacking cough, non productive, no O2 desat, afebrile.  Cardiovascular: Negative for chest pain, palpitations and leg swelling.  Gastrointestinal: Negative for abdominal pain, constipation, nausea and vomiting.  Genitourinary: Positive for frequency. Negative for dysuria and urgency.  Musculoskeletal: Positive for arthralgias and gait problem. Negative for back pain, myalgias and neck pain.       Left hip/leg pain.   Skin: Negative for rash.       Right forearm skin tear, well approximated, no s/s of infection.   Neurological: Negative for dizziness, tremors, seizures, weakness and headaches.  Hematological: Negative.     Psychiatric/Behavioral: Positive for confusion and decreased concentration. Negative for hallucinations and suicidal ideas. The patient is nervous/anxious.     Immunization History  Administered Date(s) Administered  . Influenza-Unspecified 06/06/2013, 04/21/2015, 05/17/2016  . PPD Test 07/23/2011  . Pneumococcal Polysaccharide-23 03/14/2009  . Td 03/14/2009  . Zoster 06/29/2006   Pertinent  Health Maintenance Due  Topic Date Due  . OPHTHALMOLOGY EXAM  07/28/1927  . URINE MICROALBUMIN  07/28/1927  . DEXA SCAN  07/27/1982  . PNA vac Low Risk Adult (2 of 2 - PCV13) 03/14/2010  . HEMOGLOBIN A1C  07/29/2015  . FOOT EXAM  01/26/2016  . INFLUENZA VACCINE  Completed   Fall Risk  01/26/2015 03/06/2014  Falls in the past year? Yes Yes  Number falls in past yr: 1 1  Injury with Fall? Yes Yes  Risk Factor Category  - High Fall Risk  Risk for fall due to : History of fall(s);Impaired balance/gait;Impaired mobility -  Follow up Falls evaluation completed -   Functional Status Survey:    Vitals:   06/02/16 1123  BP: 136/60  Pulse: (!) 58  Resp: 20  Temp: 98.2 F (36.8 C)  Weight: 88 lb 14.4 oz (40.3 kg)  Height: 5\' 2"  (1.575 m)   Body mass index is 16.26 kg/m. Physical Exam  Constitutional:  Extremely thin  HENT:  Head: Normocephalic and atraumatic.  Right Ear: External ear normal.  Left Ear: External ear normal.  Nose: Nose normal.  Mouth/Throat: Oropharynx is clear and moist. No oropharyngeal exudate.  Eyes: Conjunctivae and EOM are normal. Pupils are equal, round, and reactive to light. Right eye exhibits no discharge. Left eye exhibits no discharge. No scleral icterus.  Dry eyes  Neck: Normal range of motion. Neck supple. No JVD present. No thyromegaly present.  Cardiovascular: Normal rate.   Murmur heard. Pacemaker. Systolic ejection murmur 3/6  Pulmonary/Chest: Effort normal and breath sounds normal. No respiratory distress. She has no wheezes. She has no rales. She  exhibits no tenderness.  Abdominal: Soft. Bowel sounds are normal. She exhibits no distension. There is no tenderness. There is no rebound.  Musculoskeletal: Normal range of motion. She exhibits tenderness. She exhibits no edema.  Left hip and leg pain  Lymphadenopathy:    She has no cervical adenopathy.  Neurological: She is alert. She has normal reflexes. No cranial nerve deficit. She exhibits normal muscle tone. Coordination normal.  Skin: Skin is warm and dry. No rash noted. She is not diaphoretic. No erythema. No pallor.  Right  forearm skin tear, well approximated, no s/s of infection.     Psychiatric: Thought content normal. Her mood appears not anxious. Her affect is not angry, not blunt, not labile and not inappropriate. Her speech is not rapid and/or pressured, not delayed and not slurred. She is slowed. She is not agitated, not aggressive, not hyperactive, not withdrawn, not actively hallucinating and not combative. Thought content is not paranoid and not delusional. Cognition and memory are impaired. She expresses impulsivity and inappropriate judgment. She exhibits a depressed mood. She exhibits abnormal recent memory.  Confusion, HOH, very low vision. Guards against examination. She is attentive.    Labs reviewed: No results for input(s): NA, K, CL, CO2, GLUCOSE, BUN, CREATININE, CALCIUM, MG, PHOS in the last 8760 hours. No results for input(s): AST, ALT, ALKPHOS, BILITOT, PROT, ALBUMIN in the last 8760 hours. No results for input(s): WBC, NEUTROABS, HGB, HCT, MCV, PLT in the last 8760 hours. Lab Results  Component Value Date   TSH 3.55 01/27/2015   Lab Results  Component Value Date   HGBA1C 5.8 01/27/2015   Lab Results  Component Value Date   CHOL 140 12/20/2012   HDL 38 12/20/2012   LDLCALC 57 12/20/2012   TRIG 227 (A) 12/20/2012   CHOLHDL 5.0 12/22/2007    Significant Diagnostic Results in last 30 days:  No results found.  Assessment/Plan Essential  hypertension Normalized, off Metoprolol.   ATRIAL FIBRILLATION Heart rate is in control, off meds.    Constipation Stable, continue Senokot S II qhs, prn Bisacodyl 10mg  suppository available to her    Dementia 04/03/14 MMSE 22/30.  Comfort measures only, continue SNF MCU, comfort care.   Depression Mood is managed on  Lexapro 10mg  daily  FTT (failure to thrive) in adult Persists, supportive care, prn Morphine, Ativan, Tylenol available to her.   Cellulitis of leg, right The right shin skin abrasion developed in to a 1/3 anterior lower leg erythema, fever, tenderness, yellow drainage, noted last night.  Apply Bactroban oint bid to affected area until healed Doxycycline 100mg  bid x 7 days.      Family/ staff Communication: continue SNF, MCU, comfort measures.   Labs/tests ordered:  none

## 2016-06-07 DIAGNOSIS — I495 Sick sinus syndrome: Secondary | ICD-10-CM | POA: Diagnosis not present

## 2016-06-07 DIAGNOSIS — I4891 Unspecified atrial fibrillation: Secondary | ICD-10-CM | POA: Diagnosis not present

## 2016-06-07 DIAGNOSIS — F0151 Vascular dementia with behavioral disturbance: Secondary | ICD-10-CM | POA: Diagnosis not present

## 2016-06-07 DIAGNOSIS — R634 Abnormal weight loss: Secondary | ICD-10-CM | POA: Diagnosis not present

## 2016-06-07 DIAGNOSIS — E46 Unspecified protein-calorie malnutrition: Secondary | ICD-10-CM | POA: Diagnosis not present

## 2016-06-07 DIAGNOSIS — R630 Anorexia: Secondary | ICD-10-CM | POA: Diagnosis not present

## 2016-06-09 ENCOUNTER — Non-Acute Institutional Stay (SKILLED_NURSING_FACILITY): Payer: Medicare Other | Admitting: Nurse Practitioner

## 2016-06-09 ENCOUNTER — Encounter: Payer: Self-pay | Admitting: Nurse Practitioner

## 2016-06-09 DIAGNOSIS — G894 Chronic pain syndrome: Secondary | ICD-10-CM | POA: Insufficient documentation

## 2016-06-09 DIAGNOSIS — F0281 Dementia in other diseases classified elsewhere with behavioral disturbance: Secondary | ICD-10-CM | POA: Diagnosis not present

## 2016-06-09 DIAGNOSIS — F325 Major depressive disorder, single episode, in full remission: Secondary | ICD-10-CM | POA: Diagnosis not present

## 2016-06-09 DIAGNOSIS — Z029 Encounter for administrative examinations, unspecified: Secondary | ICD-10-CM

## 2016-06-09 DIAGNOSIS — F02818 Dementia in other diseases classified elsewhere, unspecified severity, with other behavioral disturbance: Secondary | ICD-10-CM

## 2016-06-09 DIAGNOSIS — K5900 Constipation, unspecified: Secondary | ICD-10-CM | POA: Diagnosis not present

## 2016-06-09 NOTE — Assessment & Plan Note (Signed)
Mood is managed on  Lexapro 10mg  daily

## 2016-06-09 NOTE — Progress Notes (Signed)
[facility]:  [facility] Room Number: 105 Place of Service:  SNF (31) Provider:  Nolin Grell, Manxie  NP  Murray HodgkinsArthur Green, MD  Patient Care Team: Kimber RelicArthur G Green, MD as PCP - General (Internal Medicine) Amyia Lodwick Johnney OuX Nivedita Mirabella, NP as Nurse Practitioner (Nurse Practitioner) [facility] Harle StanfordKimbrough Jr., MD as Consulting Physician (Urology) Rachael Feeaniel P Jacobs, MD as Consulting Physician (Gastroenterology) Salvatore Marvelobert Wainer, MD as Consulting Physician (Orthopedic Surgery) Venancio PoissonLaura Lomax, MD as Consulting Physician (Dermatology) Sheral Apleyimothy D Murphy, MD as Attending Physician (Orthopedic Surgery)  Extended Emergency Contact Information Primary Emergency Contact: Cook,[name] Address: [address]          [place], [place] [address] [place] Home Phone: [phone] Work Phone: [phone] Mobile Phone: [phone] Relation: Son Secondary Emergency Contact: Aldana,[name] Address: [address]          [place], [place] [address] [place] Home Phone: [phone] Relation: Relative  Code Status:  DNR Goals of care: Advanced Directive information Advanced Directives 06/02/2016  Does patient have an advance directive? Yes  Type of Estate agentAdvance Directive Healthcare Power of ArcherAttorney;Living will;Out of facility DNR (pink MOST or yellow form)  Does patient want to make changes to advanced directive? No - Patient declined  Copy of advanced directive(s) in chart? Yes  Pre-existing out of facility DNR order (yellow form or pink MOST form) -     Chief Complaint  Patient presents with  . Acute Visit    Ativan 1 mg(benefit vs need    HPI:  Pt is a 82 y.o. female seen today for an acute visit for evaluation of Lorazepam prn q2h, her goal of care is comfort measures.     The right shin skin abrasion developed in to a 1/3 anterior lower leg erythema, fever, tenderness, yellow drainage, improved on Doxycycline for cellulitis.              Hx of hypothyroidism, last TSH  wnl 12/2014 off Levothyroxine 12.185mcg. Her blood sugar is controlled on diet, last Hgb A1c 5.8 12/2014. Blood pressure and heart rate are controlled offmetoprolol. Her mood is managed on Lexapro 10mg  , dementia requires SNF MCU for care needs, her urinary incontinent is chronic and managed with adult depends. She has BM daily. She is still able to feed self at meals. FTT persisted. Healed left superior pubic ramus fx at the acetabulum hairline extension into the hip joint. Her goal of care is comfort measures, prn Morphine, Lorazepam, Tylenol available to her.     Past Medical History:  Diagnosis Date  . Abnormality of gait 09/08/2003  . AF (atrial fibrillation) (HCC)   . Anxiety state, unspecified 08/19/2010  . Aortic stenosis   . Atrial fibrillation (HCC) 07/14/2009   Qualifier: Diagnosis of  By: Graciela HusbandsKlein, MD, Ruthann CancerFACC, Ty HiltsSteven Cochran   . Bradycardia   . Cardiac pacemaker in situ 09/12/2004  . Closed fracture of lumbar vertebra without mention of spinal cord injury 107/10/2008  . Closed fracture of unspecified trochanteric section of femur 05/29/2011  . Constipation 03/14/2014  . Dementia 2013   04/03/14 MMSE 22/30   . Depression 04/10/2014  . Diffuse cystic mastopathy 12/23/2009  . Disturbance of salivary secretion [id number]  . Edema 08/18/2003  . Fall 12/19/2012   07/22/14 fall VS 144/76, 60, 20, 97.1. No apparent injury   . Flatulence, eructation, and gas pain 12/03/2009  . Fracture of greater trochanter of left femur (HCC) 03/08/14  . FTT (failure to thrive) in adult 12/15/2014  .  Hearing loss   . Hemorrhoids   . Hip fracture (HCC)   . HTN (hypertension)   . Hypothyroidism 12/19/2007   Qualifier: Diagnosis of  By: Melvyn Neth CMA (AAMA), Patty  01/16/14 TSH 2.474 03/24/14 TSH 2.576    . 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 Loss of weight 10/13/2014   Multiple factorials: dementia, depression, advanced age, possible AR of medications(metformin and  Oxybutynin)-will increase Mirtazapine and continue supplement. Observe.    . Macular degeneration (senile) of retina, unspecified 12/12/2001  . Macular degeneration of both eyes 01/24/2013   Severe visual impairment. Nearly blind.   . Mitral valve prolapse   . Occlusion and stenosis of carotid artery without mention of cerebral infarction 12/27/2007  . Other and unspecified hyperlipidemia   . Other malaise and fatigue 12/06/2007  . Pacemaker-dual  medtronic 09/07/2011  . Personal history of fall 01/29/2009  . Rectal bleeding   . Senile osteoporosis 12/13/2000  . Sinus node dysfunction (HCC)   . Syncope and collapse 12/27/2007  . Trochanteric fracture of left femur (HCC) 03/14/2014   CT scan did however reveal acute fracture in greater left trochanteric. Dr. Margarita Rana consulted. Recommended nonoperative management and weightbearing as tolerated and another 3 weeks of Lovenox for DVT prophylaxis.       . Type 2 diabetes mellitus with blindness, with macular edema, with severe nonproliferative retinopathy 12/19/2007   Qualifier: Diagnosis of  By: Melvyn Neth CMA (AAMA), Patty  01/09/14 Hgb A1c 6.5 01/16/14 Hgb A1c 6.5 03/24/14 Hgb 6.4 12/10/14 pharm: dc Metformin and CBG monitoring-failure to thrive and refusing to eat.     Marland Kitchen Unspecified hereditary and idiopathic peripheral neuropathy 10/28/202012  . Unspecified hypothyroidism 03/18/2010  . Unspecified urinary incontinence 02/22/2005  . Urinary tract infection, site not specified 04/10/2014  . Xerophthalmus 01/21/2015   Past Surgical History:  Procedure Laterality Date  . BREAST BIOPSY Left 1970   benign  . Left hip surgery  09/2005  . PACEMAKER INSERTION  2006   Medtronic In-Pulse  . Right hip surgery  08/2003  . TONSILLECTOMY    . 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 Codeine     unknown  . Ibuprofen     unknown  . Lisinopril     unknown  .  Penicillins     unknown  . Sanctura [Trospium Chloride]     unknown      Medication List       Accurate as of 06/09/16  3:27 PM. Always use your most recent med list.          atropine 1 % ophthalmic solution Place 4 drops every 2 hours as needed for excess secretions   bisacodyl 10 MG suppository Commonly known as:  DULCOLAX Place 10 mg rectally as needed for moderate constipation.   escitalopram 10 MG tablet Commonly known as:  LEXAPRO Take 10 mg by mouth daily.   LORazepam 1 MG tablet Commonly known as:  ATIVAN Take 1 mg by mouth every 2 (two) hours as needed for anxiety.   morphine 20 MG/ML concentrated solution Commonly known as:  ROXANOL Give 0.63ml (5mg ) by mouth every two hours as needed   sennosides-docusate sodium 8.6-50 MG tablet Commonly known as:  SENOKOT-S Take 2 tablets by mouth at bedtime. Reported on 12/11/2015   TYLENOL 325 MG tablet Generic drug:  acetaminophen Take 650  mg by mouth every 4 (four) hours as needed for mild pain or moderate pain.       Review of Systems  Constitutional: Negative for chills and fever.       Emaciated, weight has been stabilized past 3-4 months  HENT: Positive for hearing loss. Negative for congestion, ear discharge and ear pain.   Eyes: Negative for pain, discharge and redness.       Low vision Dry eyes  Respiratory: Negative for cough, shortness of breath, wheezing and stridor.        Occasional hacking cough, non productive, no O2 desat, afebrile.  Cardiovascular: Negative for chest pain, palpitations and leg swelling.  Gastrointestinal: Negative for abdominal pain, constipation, nausea and vomiting.  Genitourinary: Positive for frequency. Negative for dysuria and urgency.  Musculoskeletal: Positive for arthralgias and gait problem. Negative for back pain, myalgias and neck pain.       Left hip/leg pain.   Skin: Negative for rash.       Improved right lower leg cellulitis.   Neurological: Negative for  dizziness, tremors, seizures, weakness and headaches.  Hematological: Negative.   Psychiatric/Behavioral: Positive for confusion and decreased concentration. Negative for hallucinations and suicidal ideas. The patient is nervous/anxious.     Immunization History  Administered Date(s) Administered  . Influenza-Unspecified 06/06/2013, 04/21/2015, 05/17/2016  . PPD Test 07/23/2011  . Pneumococcal Polysaccharide-23 03/14/2009  . Td 03/14/2009  . Zoster 06/29/2006   Pertinent  Health Maintenance Due  Topic Date Due  . OPHTHALMOLOGY EXAM  07/[birth year]/1927  . URINE MICROALBUMIN  07/28[birth year]1927  . DEXA SCAN  07/27/1982  . PNA vac Low Risk Adult (2 of 2 - PCV13) 03/14/2010  . HEMOGLOBIN A1C  07/29/2015  . FOOT EXAM  01/26/2016  . INFLUENZA VACCINE  Completed   Fall Risk  01/26/2015 03/06/2014  Falls in the past year? Yes Yes  Number falls in past yr: 1 1  Injury with Fall? Yes Yes  Risk Factor Category  - High Fall Risk  Risk for fall due to : History of fall(s);Impaired balance/gait;Impaired mobility -  Follow up Falls evaluation completed -   Functional Status Survey:    Vitals:   06/09/16 1458  BP: 100/65  Pulse: 70  Resp: 18  Temp: 97.2 F (36.2 C)  Weight: 89 lb 6.4 oz (40.6 kg)  Height: 5\' 2"  (1.575 m)   Body mass index is 16.35 kg/m. Physical Exam  Constitutional:  Extremely thin  HENT:  Head: Normocephalic and atraumatic.  Right Ear: External ear normal.  Left Ear: External ear normal.  Nose: Nose normal.  Mouth/Throat: Oropharynx is clear and moist. No oropharyngeal exudate.  Eyes: Conjunctivae and EOM are normal. Pupils are equal, round, and reactive to light. Right eye exhibits no discharge. Left eye exhibits no discharge. No scleral icterus.  Dry eyes  Neck: Normal range of motion. Neck supple. No JVD present. No thyromegaly present.  Cardiovascular: Normal rate.   Murmur heard. Pacemaker. Systolic ejection murmur 3/6  Pulmonary/Chest: Effort normal and breath  sounds normal. No respiratory distress. She has no wheezes. She has no rales. She exhibits no tenderness.  Abdominal: Soft. Bowel sounds are normal. She exhibits no distension. There is no tenderness. There is no rebound.  Musculoskeletal: Normal range of motion. She exhibits tenderness. She exhibits no edema.  Left hip and leg pain  Lymphadenopathy:    She has no cervical adenopathy.  Neurological: She is alert. She has normal reflexes. No cranial nerve deficit. She exhibits normal muscle  tone. Coordination normal.  Skin: Skin is warm and dry. No rash noted. She is not diaphoretic. No erythema. No pallor.  Improved right lower leg cellulitis.     Psychiatric: Thought content normal. Her mood appears not anxious. Her affect is not angry, not blunt, not labile and not inappropriate. Her speech is not rapid and/or pressured, not delayed and not slurred. She is slowed. She is not agitated, not aggressive, not hyperactive, not withdrawn, not actively hallucinating and not combative. Thought content is not paranoid and not delusional. Cognition and memory are impaired. She expresses impulsivity and inappropriate judgment. She exhibits a depressed mood. She exhibits abnormal recent memory.  Confusion, HOH, very low vision. Guards against examination. She is attentive.    Labs reviewed: No results for input(s): NA, K, CL, CO2, GLUCOSE, BUN, CREATININE, CALCIUM, MG, PHOS in the last 8760 hours. No results for input(s): AST, ALT, ALKPHOS, BILITOT, PROT, ALBUMIN in the last 8760 hours. No results for input(s): WBC, NEUTROABS, HGB, HCT, MCV, PLT in the last 8760 hours. Lab Results  Component Value Date   TSH 3.55 01/27/2015   Lab Results  Component Value Date   HGBA1C 5.8 01/27/2015   Lab Results  Component Value Date   CHOL 140 12/20/2012   HDL 38 12/20/2012   LDLCALC 57 12/20/2012   TRIG 227 (A) 12/20/2012   CHOLHDL 5.0 12/22/2007    Significant Diagnostic Results in last 30 days:  No  results found.  Assessment/Plan Dementia 04/03/14 MMSE 22/30.  Comfort measures only, continue SNF MCU, Hospice Service, comfort care.  Continue Lorazepam q2h prn for comfort measures.    Constipation Stable, continue Senokot S II qhs   Depression Mood is managed on  Lexapro 10mg  daily  Chronic pain syndrome Multiple sites, prn Morphine available to her to manage pain.      Family/ staff Communication: continue SNF, Hospice Service.   Labs/tests ordered:  none

## 2016-06-09 NOTE — Assessment & Plan Note (Signed)
Multiple sites, prn Morphine available to her to manage pain.

## 2016-06-09 NOTE — Assessment & Plan Note (Signed)
04/03/14 MMSE 22/30.  Comfort measures only, continue SNF MCU, Hospice Service, comfort care.  Continue Lorazepam q2h prn for comfort measures.

## 2016-06-09 NOTE — Assessment & Plan Note (Signed)
Stable, continue Senokot S II qhs. 

## 2016-06-13 DIAGNOSIS — R634 Abnormal weight loss: Secondary | ICD-10-CM | POA: Diagnosis not present

## 2016-06-13 DIAGNOSIS — I4891 Unspecified atrial fibrillation: Secondary | ICD-10-CM | POA: Diagnosis not present

## 2016-06-13 DIAGNOSIS — R630 Anorexia: Secondary | ICD-10-CM | POA: Diagnosis not present

## 2016-06-13 DIAGNOSIS — F0151 Vascular dementia with behavioral disturbance: Secondary | ICD-10-CM | POA: Diagnosis not present

## 2016-06-13 DIAGNOSIS — I495 Sick sinus syndrome: Secondary | ICD-10-CM | POA: Diagnosis not present

## 2016-06-13 DIAGNOSIS — E46 Unspecified protein-calorie malnutrition: Secondary | ICD-10-CM | POA: Diagnosis not present

## 2016-06-16 IMAGING — CT CT HEAD W/O CM
2 series · 16 of 30 positions shown, 19 images · non-contrast
Comparison: 05/24/2011

CLINICAL DATA: falls, posterior scalp hematoma.  Recent falls.

EXAM:
CT HEAD WITHOUT CONTRAST
TECHNIQUE: Contiguous axial images were obtained from the base of the skull
through the vertex without intravenous contrast.

[Series 2: head w/o · axial · non-contrast · 0.45mm/px · z∈[-121,+4]mm · 9 of 33 slices shown, 12 images]
[im 4/33  brain]
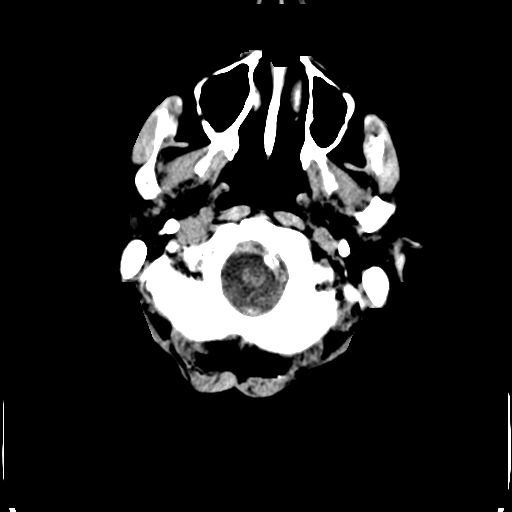
[im 4/33  bone]
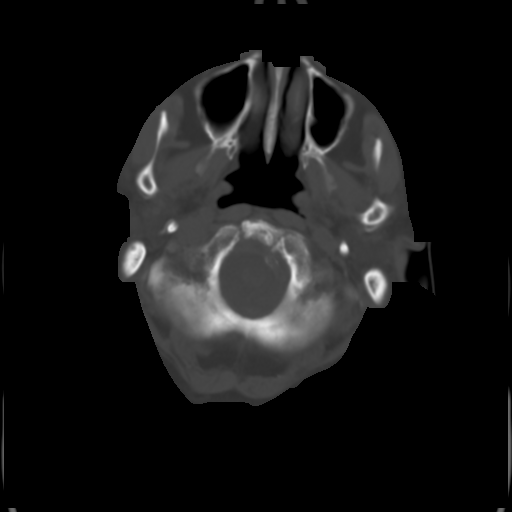
[im 7/33  brain]
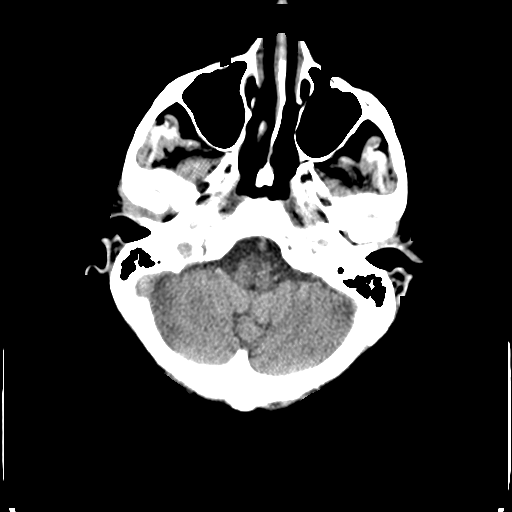
[im 10/33  brain]
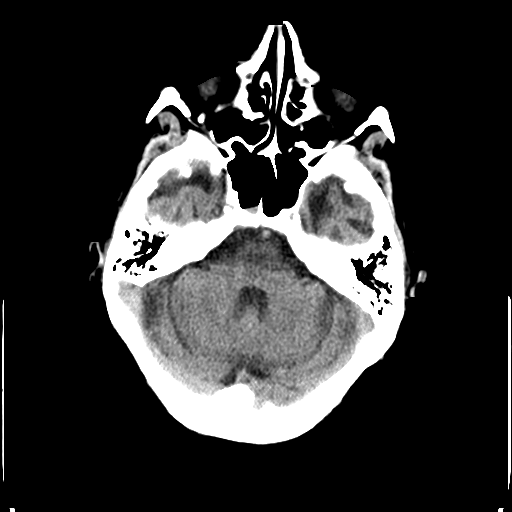
[im 13/33  brain]
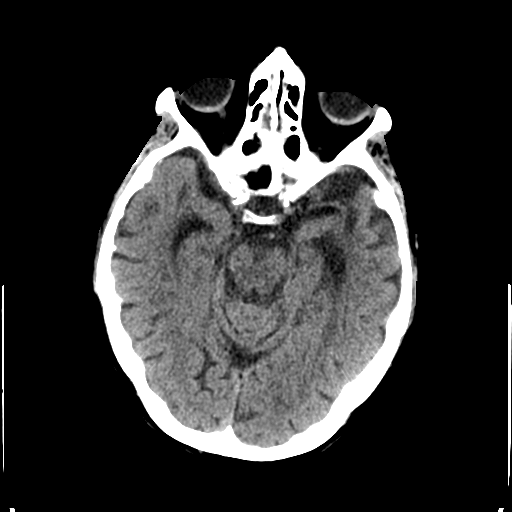
[im 17/33  brain]
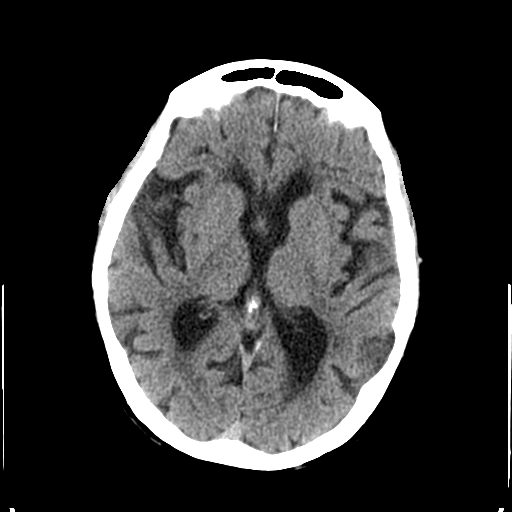
[im 17/33  bone]
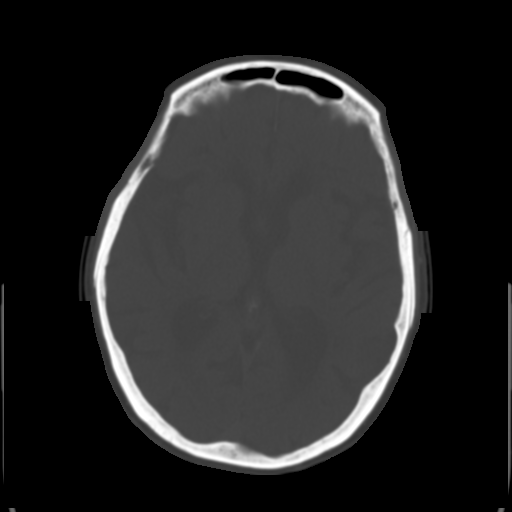
[im 20/33  brain]
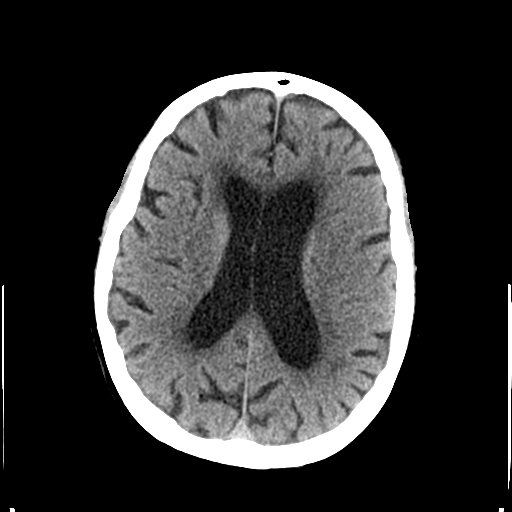
[im 23/33  brain]
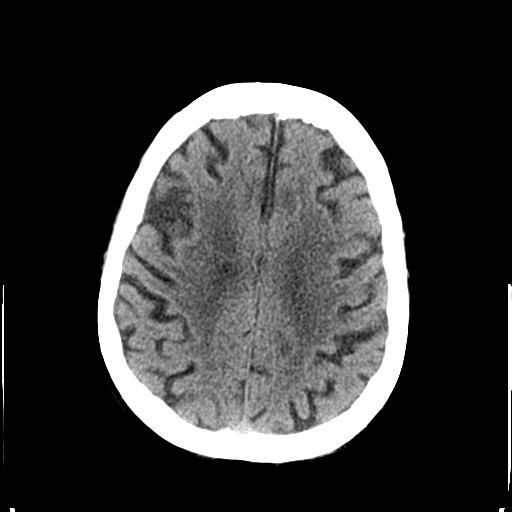
[im 26/33  brain]
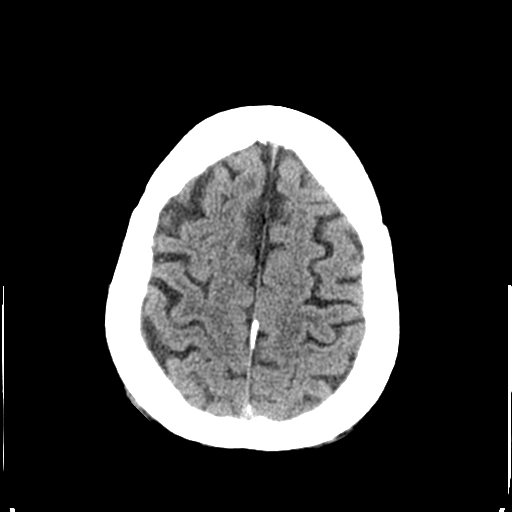
[im 29/33  brain]
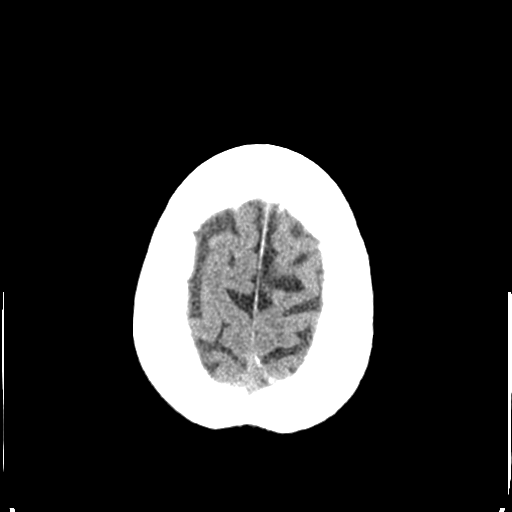
[im 29/33  bone]
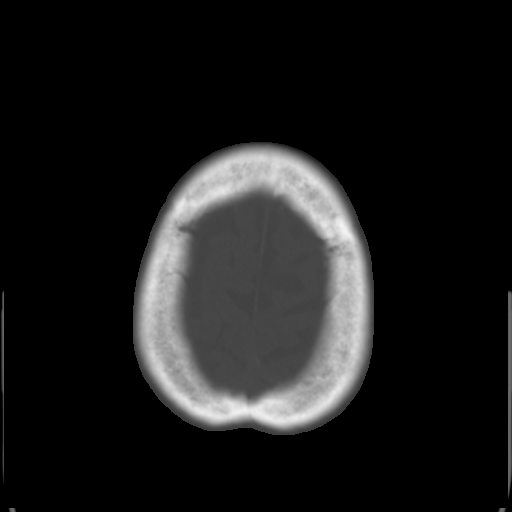

[Series 3: bone windows · axial · 0.45mm/px · z∈[-118,-10]mm · 7 of 55 slices shown]
[im 7/55  bone]
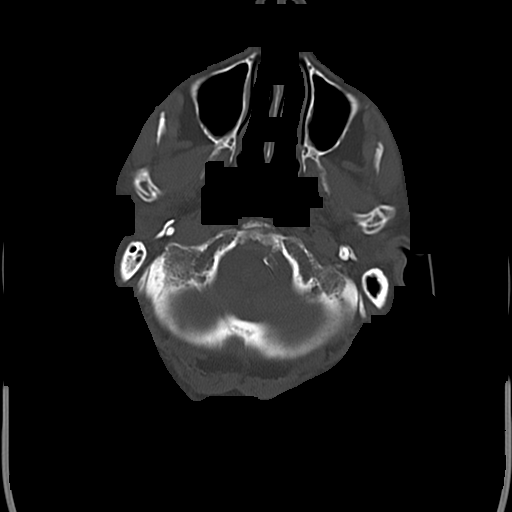
[im 13/55  bone]
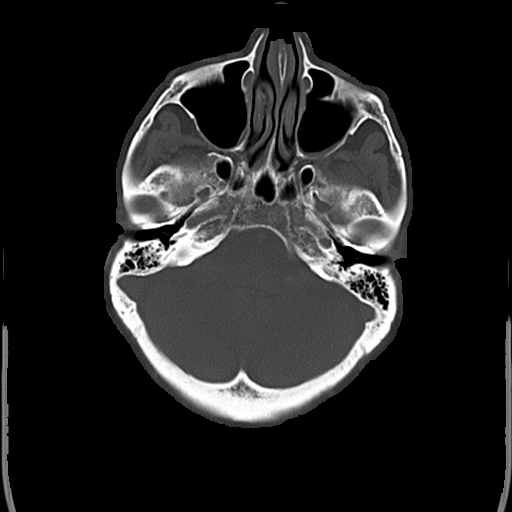
[im 19/55  bone]
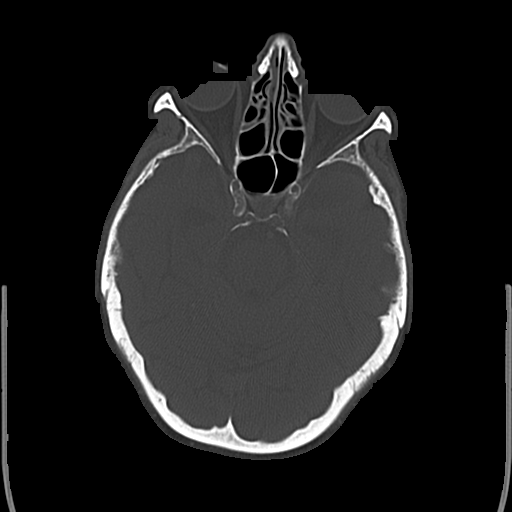
[im 25/55  bone]
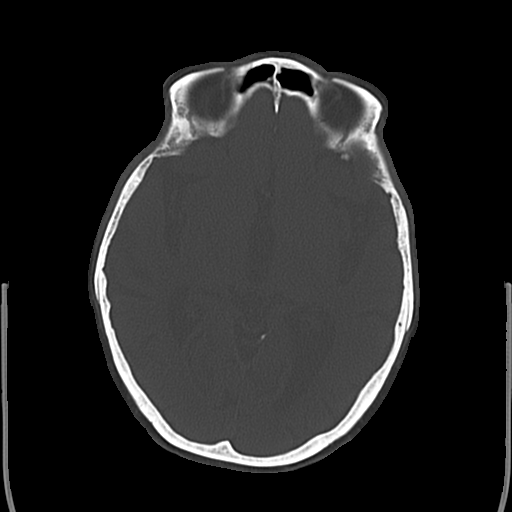
[im 31/55  bone]
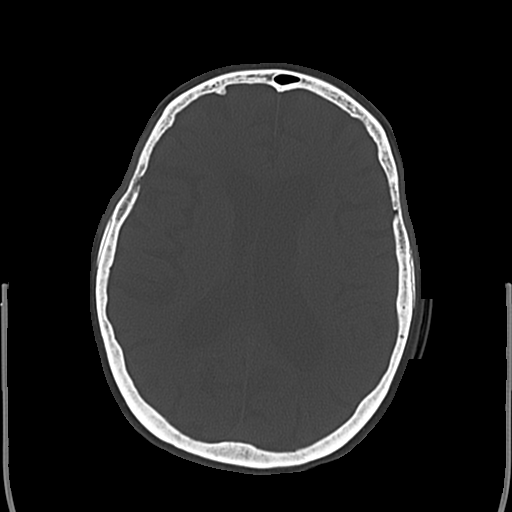
[im 37/55  bone]
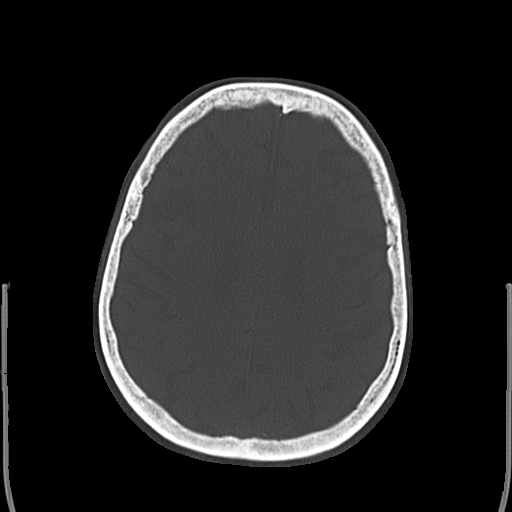
[im 43/55  bone]
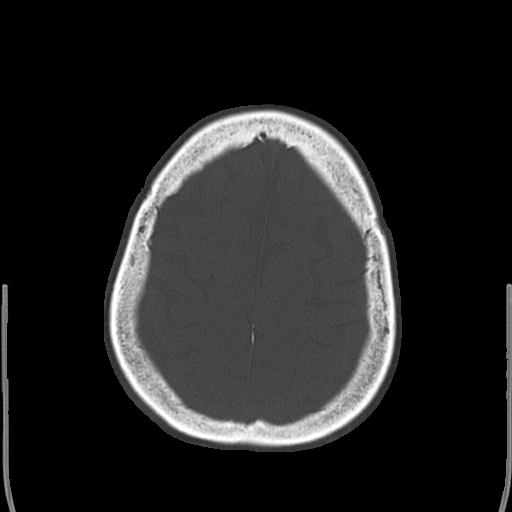

[16 of 30 positions shown; findings below may reference images not displayed]

FINDINGS: There is moderate central and cortical atrophy. Periventricular
white matter changes are consistent with small vessel disease. There
is no evidence for hemorrhage, mass lesion, or acute infarction.

Right posterior parietal scalp hematoma is noted. There is no
underlying calvarial fracture. Small fluid level is identified
within the right sphenoid sinus. No sinus wall fracture identified.
There is atherosclerotic calcification of the internal carotid
arteries.
IMPRESSION: 1. Atrophy and small vessel disease.
2.  No evidence for acute intracranial abnormality.
3. Right posterior parietal scalp hematoma without underlying
calvarial fracture.
4. Sinusitis involving the right sphenoid air cell. No evidence for
sinus wall fracture

## 2016-06-17 DIAGNOSIS — I4891 Unspecified atrial fibrillation: Secondary | ICD-10-CM | POA: Diagnosis not present

## 2016-06-17 DIAGNOSIS — F0151 Vascular dementia with behavioral disturbance: Secondary | ICD-10-CM | POA: Diagnosis not present

## 2016-06-17 DIAGNOSIS — R634 Abnormal weight loss: Secondary | ICD-10-CM | POA: Diagnosis not present

## 2016-06-17 DIAGNOSIS — E46 Unspecified protein-calorie malnutrition: Secondary | ICD-10-CM | POA: Diagnosis not present

## 2016-06-17 DIAGNOSIS — I495 Sick sinus syndrome: Secondary | ICD-10-CM | POA: Diagnosis not present

## 2016-06-17 DIAGNOSIS — R630 Anorexia: Secondary | ICD-10-CM | POA: Diagnosis not present

## 2016-06-20 DIAGNOSIS — R634 Abnormal weight loss: Secondary | ICD-10-CM | POA: Diagnosis not present

## 2016-06-20 DIAGNOSIS — F0151 Vascular dementia with behavioral disturbance: Secondary | ICD-10-CM | POA: Diagnosis not present

## 2016-06-20 DIAGNOSIS — I495 Sick sinus syndrome: Secondary | ICD-10-CM | POA: Diagnosis not present

## 2016-06-20 DIAGNOSIS — R630 Anorexia: Secondary | ICD-10-CM | POA: Diagnosis not present

## 2016-06-20 DIAGNOSIS — E46 Unspecified protein-calorie malnutrition: Secondary | ICD-10-CM | POA: Diagnosis not present

## 2016-06-20 DIAGNOSIS — I4891 Unspecified atrial fibrillation: Secondary | ICD-10-CM | POA: Diagnosis not present

## 2016-06-29 DIAGNOSIS — I4891 Unspecified atrial fibrillation: Secondary | ICD-10-CM | POA: Diagnosis not present

## 2016-06-29 DIAGNOSIS — R634 Abnormal weight loss: Secondary | ICD-10-CM | POA: Diagnosis not present

## 2016-06-29 DIAGNOSIS — R630 Anorexia: Secondary | ICD-10-CM | POA: Diagnosis not present

## 2016-06-29 DIAGNOSIS — E46 Unspecified protein-calorie malnutrition: Secondary | ICD-10-CM | POA: Diagnosis not present

## 2016-06-29 DIAGNOSIS — F0151 Vascular dementia with behavioral disturbance: Secondary | ICD-10-CM | POA: Diagnosis not present

## 2016-06-29 DIAGNOSIS — I495 Sick sinus syndrome: Secondary | ICD-10-CM | POA: Diagnosis not present

## 2016-07-01 DIAGNOSIS — I495 Sick sinus syndrome: Secondary | ICD-10-CM | POA: Diagnosis not present

## 2016-07-01 DIAGNOSIS — I10 Essential (primary) hypertension: Secondary | ICD-10-CM | POA: Diagnosis not present

## 2016-07-01 DIAGNOSIS — M84359S Stress fracture, hip, unspecified, sequela: Secondary | ICD-10-CM | POA: Diagnosis not present

## 2016-07-01 DIAGNOSIS — R634 Abnormal weight loss: Secondary | ICD-10-CM | POA: Diagnosis not present

## 2016-07-01 DIAGNOSIS — I4891 Unspecified atrial fibrillation: Secondary | ICD-10-CM | POA: Diagnosis not present

## 2016-07-01 DIAGNOSIS — R630 Anorexia: Secondary | ICD-10-CM | POA: Diagnosis not present

## 2016-07-01 DIAGNOSIS — E1159 Type 2 diabetes mellitus with other circulatory complications: Secondary | ICD-10-CM | POA: Diagnosis not present

## 2016-07-01 DIAGNOSIS — E46 Unspecified protein-calorie malnutrition: Secondary | ICD-10-CM | POA: Diagnosis not present

## 2016-07-01 DIAGNOSIS — E039 Hypothyroidism, unspecified: Secondary | ICD-10-CM | POA: Diagnosis not present

## 2016-07-01 DIAGNOSIS — F339 Major depressive disorder, recurrent, unspecified: Secondary | ICD-10-CM | POA: Diagnosis not present

## 2016-07-01 DIAGNOSIS — F0151 Vascular dementia with behavioral disturbance: Secondary | ICD-10-CM | POA: Diagnosis not present

## 2016-07-01 DIAGNOSIS — H3530 Unspecified macular degeneration: Secondary | ICD-10-CM | POA: Diagnosis not present

## 2016-07-01 DIAGNOSIS — E785 Hyperlipidemia, unspecified: Secondary | ICD-10-CM | POA: Diagnosis not present

## 2016-07-06 DIAGNOSIS — E46 Unspecified protein-calorie malnutrition: Secondary | ICD-10-CM | POA: Diagnosis not present

## 2016-07-06 DIAGNOSIS — R630 Anorexia: Secondary | ICD-10-CM | POA: Diagnosis not present

## 2016-07-06 DIAGNOSIS — I4891 Unspecified atrial fibrillation: Secondary | ICD-10-CM | POA: Diagnosis not present

## 2016-07-06 DIAGNOSIS — F0151 Vascular dementia with behavioral disturbance: Secondary | ICD-10-CM | POA: Diagnosis not present

## 2016-07-06 DIAGNOSIS — R634 Abnormal weight loss: Secondary | ICD-10-CM | POA: Diagnosis not present

## 2016-07-06 DIAGNOSIS — I495 Sick sinus syndrome: Secondary | ICD-10-CM | POA: Diagnosis not present

## 2016-07-11 ENCOUNTER — Non-Acute Institutional Stay (SKILLED_NURSING_FACILITY): Payer: Medicare Other | Admitting: Internal Medicine

## 2016-07-11 ENCOUNTER — Encounter: Payer: Self-pay | Admitting: Internal Medicine

## 2016-07-11 DIAGNOSIS — R634 Abnormal weight loss: Secondary | ICD-10-CM | POA: Diagnosis not present

## 2016-07-11 DIAGNOSIS — S61309A Unspecified open wound of unspecified finger with damage to nail, initial encounter: Secondary | ICD-10-CM | POA: Diagnosis not present

## 2016-07-11 DIAGNOSIS — R627 Adult failure to thrive: Secondary | ICD-10-CM | POA: Diagnosis not present

## 2016-07-11 DIAGNOSIS — E113419 Type 2 diabetes mellitus with severe nonproliferative diabetic retinopathy with macular edema, unspecified eye: Secondary | ICD-10-CM

## 2016-07-11 DIAGNOSIS — IMO0002 Reserved for concepts with insufficient information to code with codable children: Secondary | ICD-10-CM | POA: Insufficient documentation

## 2016-07-11 DIAGNOSIS — E113299 Type 2 diabetes mellitus with mild nonproliferative diabetic retinopathy without macular edema, unspecified eye: Secondary | ICD-10-CM

## 2016-07-11 DIAGNOSIS — IMO0001 Reserved for inherently not codable concepts without codable children: Secondary | ICD-10-CM

## 2016-07-11 DIAGNOSIS — F0281 Dementia in other diseases classified elsewhere with behavioral disturbance: Secondary | ICD-10-CM

## 2016-07-11 DIAGNOSIS — H547 Unspecified visual loss: Secondary | ICD-10-CM | POA: Diagnosis not present

## 2016-07-11 DIAGNOSIS — F325 Major depressive disorder, single episode, in full remission: Secondary | ICD-10-CM

## 2016-07-11 DIAGNOSIS — F02818 Dementia in other diseases classified elsewhere, unspecified severity, with other behavioral disturbance: Secondary | ICD-10-CM

## 2016-07-11 NOTE — Progress Notes (Signed)
History and Physical     [facility] Room Number: N30 Place of Service:  SNF (31)   PCP: Murray Hodgkins, MD Patient Care Team: Kimber Relic, MD as PCP - General (Internal Medicine) Man Johnney Ou, NP as Nurse Practitioner (Nurse Practitioner) [facility] Harle Stanford., MD as Consulting Physician (Urology) Rachael Fee, MD as Consulting Physician (Gastroenterology) Salvatore Marvel, MD as Consulting Physician (Orthopedic Surgery) Venancio Poisson, MD as Consulting Physician (Dermatology) Sheral Apley, MD as Attending Physician (Orthopedic Surgery)  Extended Emergency Contact Information Primary Emergency Contact: Cook,[name] Address: [address]          [place], [place] [address] [place] Home Phone: [phone] Work Phone: [phone] Mobile Phone: [phone] Relation: Son Secondary Emergency Contact: Mayweather,[name] Address: [address]          [place], [place] [address] [place] Home Phone: [phone] Relation: Relative  Code Status: DNR Goals of Care: Advanced Directive information Advanced Directives 07/11/2016  Does Patient Have a Medical Advance Directive? Yes  Type of Estate agent of [place];Living will;Out of facility DNR (pink MOST or yellow form)  Does patient want to make changes to medical advance directive? -  Copy of Healthcare Power of Attorney in Chart? Yes  Pre-existing out of facility DNR order (yellow form or pink MOST form) Yellow form placed in chart (order not valid for inpatient use);Pink MOST form placed in chart (order not valid for inpatient use)     Chief Complaint  Patient presents with  . Annual Exam    History and Physical   . Nail Problem    on left foot middle toe, nail came off small bleeding.     HPI: Patient is a 82 y.o. female seen today for an annual comprehensive examination.  Patient has failure to thrive due to severe  dementia with poor intake and weight loss. She is moved from the Memory Care unit to Skilled Care today. No longer benefits from the programs in memory care unit.  She has an acute loss of he left 3rd toenail and some bleeding at the toe.  Patient may be depressed. She does not respond well to stimulation. She is blind and has a hearing loss.  Past Medical History:  Diagnosis Date  . Abnormality of gait 09/08/2003  . AF (atrial fibrillation) (HCC)   . Anxiety state, unspecified 08/19/2010  . Aortic stenosis   . Atrial fibrillation (HCC) 07/14/2009   Qualifier: Diagnosis of  By: [name], MD, Ruthann Cancer Ty Hilts   . Bradycardia   . Cardiac pacemaker in situ 09/12/2004  . Closed fracture of lumbar vertebra without mention of spinal cord injury 1December 08, 202010  . Closed fracture of unspecified trochanteric section of femur 05/29/2011  . Constipation 03/14/2014  . Dementia 2013   04/03/14 MMSE 22/30   . Depression 04/10/2014  . Diffuse cystic mastopathy 12/23/2009  . Disturbance of salivary secretion [id number]  . Edema 08/18/2003  . Fall 12/19/2012   07/22/14 fall VS 144/76, 60, 20, 97.1. No apparent injury   . Flatulence, eructation, and gas pain 12/03/2009  . Fracture of greater trochanter of left femur (HCC) 03/08/14  . FTT (failure to thrive) in adult 12/15/2014  . Hearing loss   . Hemorrhoids   . Hip fracture (HCC)   . HTN (hypertension)   . Hypothyroidism 12/19/2007   Qualifier: Diagnosis of  By: Melvyn Neth CMA (AAMA), Patty  01/16/14 TSH 2.474 03/24/14 TSH  2.576    . 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 Loss of weight 10/13/2014   Multiple factorials: dementia, depression, advanced age, possible AR of medications(metformin and Oxybutynin)-will increase Mirtazapine and continue supplement. Observe.    . Macular degeneration (senile) of retina, unspecified 12/12/2001  . Macular degeneration of both eyes 01/24/2013   Severe visual impairment. Nearly blind.   .  Mitral valve prolapse   . Occlusion and stenosis of carotid artery without mention of cerebral infarction 12/27/2007  . Other and unspecified hyperlipidemia   . Other malaise and fatigue 12/06/2007  . Pacemaker-dual  medtronic 09/07/2011  . Personal history of fall 01/29/2009  . Rectal bleeding   . Senile osteoporosis 12/13/2000  . Sinus node dysfunction (HCC)   . Syncope and collapse 12/27/2007  . Trochanteric fracture of left femur (HCC) 03/14/2014   CT scan did however reveal acute fracture in greater left trochanteric. Dr. Margarita Ranaimothy Murphy consulted. Recommended nonoperative management and weightbearing as tolerated and another 3 weeks of Lovenox for DVT prophylaxis.       . Type 2 diabetes mellitus with blindness, with macular edema, with severe nonproliferative retinopathy 12/19/2007   Qualifier: Diagnosis of  By: Melvyn NethLewis CMA (AAMA), Patty  01/09/14 Hgb A1c 6.5 01/16/14 Hgb A1c 6.5 03/24/14 Hgb 6.4 12/10/14 pharm: dc Metformin and CBG monitoring-failure to thrive and refusing to eat.     Marland Kitchen. Unspecified hereditary and idiopathic peripheral neuropathy 10/28/202012  . Unspecified hypothyroidism 03/18/2010  . Unspecified urinary incontinence 02/22/2005  . Urinary tract infection, site not specified 04/10/2014  . Xerophthalmus 01/21/2015   Past Surgical History:  Procedure Laterality Date  . BREAST BIOPSY Left 1970   benign  . Left hip surgery  09/2005  . PACEMAKER INSERTION  2006   Medtronic In-Pulse  . Right hip surgery  08/2003  . TONSILLECTOMY    . 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 She has never used smokeless tobacco. She reports that she does not drink alcohol or use drugs. Social History   Social History  . Marital status: Widowed    Spouse name: N/A  . Number of children: N/A  . Years of education: N/A   Social History Main Topics  . Smoking status: Never Smoker  . Smokeless  tobacco: Never Used  . Alcohol use No  . Drug use: No  . Sexual activity: No   Other Topics Concern  . None   Social History Narrative   [facility] [place], transferred to memory care unit 04/29/14   Widowed   Never smoked   Alcohol none   Exercise none   DNR/ Living Will, MOST, POA      [facility]   Family History  Problem Relation Age of Onset  . Colon cancer Mother   . Cancer Mother     colon  . Heart attack Father   . Heart disease Father     MI  . Heart disease Sister     MI    Pertinent  Health Maintenance Due  Topic Date Due  . OPHTHALMOLOGY EXAM  07/28/1925  . URINE MICROALBUMIN  [birth year]27  . DEXA SCAN  07/27/1982  . PNA vac Low Risk Adult (2 of 2 - PCV13) 03/14/2010  . HEMOGLOBIN A1C  07/29/2015  . FOOT EXAM  01/26/2016  . INFLUENZA  VACCINE  Completed   Fall Risk  01/26/2015 03/06/2014  Falls in the past year? Yes Yes  Number falls in past yr: 1 1  Injury with Fall? Yes Yes  Risk Factor Category  - High Fall Risk  Risk for fall due to : History of fall(s);Impaired balance/gait;Impaired mobility -  Follow up Falls evaluation completed -   [facility] 01/26/2015 03/06/2014  Decreased Interest 1 2  Down, Depressed, Hopeless 1 1  PHQ - 2 Score 2 3  Altered sleeping 1 -  Tired, decreased energy 1 -  Change in appetite 0 -  Feeling bad or failure about yourself  1 -  Trouble concentrating 1 -  Moving slowly or fidgety/restless 0 -  Suicidal thoughts 0 -  PHQ-9 Score 6 -  Difficult doing work/chores Not difficult at all -    Functional Status Survey:    Allergies  Allergen Reactions  . Codeine     unknown  . Ibuprofen     unknown  . Lisinopril     unknown  . Penicillins     unknown  . [place] [Trospium Chloride]     unknown      Medication List       Accurate as of 07/11/16  2:26 PM. Always use your most recent med list.          atropine 1 % ophthalmic solution Place 4  drops on tongue  as needed for excess secretions   bisacodyl 10 MG suppository Commonly known as:  DULCOLAX Place 10 mg rectally as needed for moderate constipation.   escitalopram 10 MG tablet Commonly known as:  LEXAPRO Take 10 mg by mouth daily.   LORazepam 1 MG tablet Commonly known as:  ATIVAN Take 1 mg by mouth. Take one every evening at 4 pm. Take one tablet every 2 hours as needed for anxiety .   morphine 20 MG/ML concentrated solution Commonly known as:  ROXANOL Give 0.6125ml (5mg ) by mouth every two hours as needed   sennosides-docusate sodium 8.6-50 MG tablet Commonly known as:  SENOKOT-S Take 2 tablets by mouth at bedtime. Reported on 12/11/2015   TYLENOL 325 MG tablet Generic drug:  acetaminophen Take 650 mg by mouth every 4 (four) hours as needed for mild pain or moderate pain.       Review of Systems  Constitutional: Negative for chills and fever.       Emaciated  HENT: Positive for hearing loss. Negative for congestion, ear discharge and ear pain.   Eyes: Negative for pain, discharge and redness.       Low vision Dry eyes  Respiratory: Negative for cough, shortness of breath, wheezing and stridor.        Occasional hacking cough, non productive, no O2 desat, afebrile.  Cardiovascular: Negative for chest pain, palpitations and leg swelling.  Gastrointestinal: Negative for abdominal pain, constipation, nausea and vomiting.  Genitourinary: Positive for frequency. Negative for dysuria and urgency.  Musculoskeletal: Positive for arthralgias and gait problem. Negative for back pain, myalgias and neck pain.       Left hip/leg pain.   Skin: Negative for rash.       Improved right lower leg cellulitis.   Neurological: Negative for dizziness, tremors, seizures, weakness and headaches.       Severre dementia  Hematological: Negative.   Psychiatric/Behavioral: Positive for confusion and decreased concentration. Negative for hallucinations and suicidal ideas. The  patient is nervous/anxious.     Vitals:   07/11/16  1413  BP: 122/72  Pulse: 68  Resp: 18  Temp: 98.5 F (36.9 C)  Weight: 88 lb 14.4 oz (40.3 kg)  Height: 5\' 2"  (1.575 m)   Body mass index is 16.26 kg/m. Physical Exam  Constitutional:  Extremely thin  HENT:  Head: Normocephalic and atraumatic.  Right Ear: External ear normal.  Left Ear: External ear normal.  Nose: Nose normal.  Mouth/Throat: Oropharynx is clear and moist. No oropharyngeal exudate.  Eyes: Conjunctivae and EOM are normal. Pupils are equal, round, and reactive to light. Right eye exhibits no discharge. Left eye exhibits no discharge. No scleral icterus.  Dry eyes. Legally blind.  Neck: Normal range of motion. Neck supple. No JVD present. No thyromegaly present.  Cardiovascular: Normal rate.   Murmur heard. Pacemaker. Systolic ejection murmur 3/6  Pulmonary/Chest: Effort normal and breath sounds normal. No respiratory distress. She has no wheezes. She has no rales. She exhibits no tenderness.  Abdominal: Soft. Bowel sounds are normal. She exhibits no distension. There is no tenderness. There is no rebound.  Musculoskeletal: Normal range of motion. She exhibits tenderness. She exhibits no edema.  Left hip and leg pain  Lymphadenopathy:    She has no cervical adenopathy.  Neurological: She is alert. She has normal reflexes. No cranial nerve deficit. She exhibits normal muscle tone. Coordination normal.  Skin: Skin is warm and dry. No rash noted. She is not diaphoretic. No erythema. No pallor.  Improved right lower leg cellulitis.     Psychiatric: Thought content normal. Her mood appears not anxious. Her affect is not angry, not blunt, not labile and not inappropriate. Her speech is not rapid and/or pressured, not delayed and not slurred. She is slowed. She is not agitated, not aggressive, not hyperactive, not withdrawn, not actively hallucinating and not combative. Thought content is not paranoid and not  delusional. Cognition and memory are impaired. She expresses impulsivity and inappropriate judgment. She exhibits a depressed mood. She exhibits abnormal recent memory.  Confusion, HOH, very low vision. Guards against examination. She is attentive.    Labs reviewed: Basic Metabolic Panel: No results for input(s): NA, K, CL, CO2, GLUCOSE, BUN, CREATININE, CALCIUM, MG, PHOS in the last 8760 hours. Liver Function Tests: No results for input(s): AST, ALT, ALKPHOS, BILITOT, PROT, ALBUMIN in the last 8760 hours. No results for input(s): LIPASE, AMYLASE in the last 8760 hours. No results for input(s): AMMONIA in the last 8760 hours. CBC: No results for input(s): WBC, NEUTROABS, HGB, HCT, MCV, PLT in the last 8760 hours. Cardiac Enzymes: No results for input(s): CKTOTAL, CKMB, CKMBINDEX, TROPONINI in the last 8760 hours. BNP: Invalid input(s): POCBNP Lab Results  Component Value Date   HGBA1C 5.8 01/27/2015   Lab Results  Component Value Date   TSH 3.55 01/27/2015   Lab Results  Component Value Date   VITAMINB12 431 03/09/2014   No results found for: FOLATE No results found for: IRON, TIBC, FERRITIN  Imaging and Procedures obtained recently: No results found.  Assessment/Plan 1. Dementia associated with other underlying disease with behavioral disturbance observe  2. Loss of weight Feeding assistance  3. FTT (failure to thrive) in adult Poor prognosis  4. DM type 2 with diabetic background retinopathy (HCC) controlled  5. Major depressive disorder with single episode, in full remission (HCC) Continue escitalopram  6. Type 2 diabetes mellitus with blindness, severe nonproliferative retinopathy, and macular edema (HCC) controlled  7. Avulsion of nail plate, initial encounter Daily application of Vaseline after soap and water cleansing,  then place dry dressing.

## 2016-07-13 DIAGNOSIS — R630 Anorexia: Secondary | ICD-10-CM | POA: Diagnosis not present

## 2016-07-13 DIAGNOSIS — F0151 Vascular dementia with behavioral disturbance: Secondary | ICD-10-CM | POA: Diagnosis not present

## 2016-07-13 DIAGNOSIS — I4891 Unspecified atrial fibrillation: Secondary | ICD-10-CM | POA: Diagnosis not present

## 2016-07-13 DIAGNOSIS — I495 Sick sinus syndrome: Secondary | ICD-10-CM | POA: Diagnosis not present

## 2016-07-13 DIAGNOSIS — E46 Unspecified protein-calorie malnutrition: Secondary | ICD-10-CM | POA: Diagnosis not present

## 2016-07-13 DIAGNOSIS — R634 Abnormal weight loss: Secondary | ICD-10-CM | POA: Diagnosis not present

## 2016-07-15 DIAGNOSIS — I4891 Unspecified atrial fibrillation: Secondary | ICD-10-CM | POA: Diagnosis not present

## 2016-07-15 DIAGNOSIS — R630 Anorexia: Secondary | ICD-10-CM | POA: Diagnosis not present

## 2016-07-15 DIAGNOSIS — R634 Abnormal weight loss: Secondary | ICD-10-CM | POA: Diagnosis not present

## 2016-07-15 DIAGNOSIS — F0151 Vascular dementia with behavioral disturbance: Secondary | ICD-10-CM | POA: Diagnosis not present

## 2016-07-15 DIAGNOSIS — E46 Unspecified protein-calorie malnutrition: Secondary | ICD-10-CM | POA: Diagnosis not present

## 2016-07-15 DIAGNOSIS — I495 Sick sinus syndrome: Secondary | ICD-10-CM | POA: Diagnosis not present

## 2016-07-21 DIAGNOSIS — I4891 Unspecified atrial fibrillation: Secondary | ICD-10-CM | POA: Diagnosis not present

## 2016-07-21 DIAGNOSIS — E46 Unspecified protein-calorie malnutrition: Secondary | ICD-10-CM | POA: Diagnosis not present

## 2016-07-21 DIAGNOSIS — I495 Sick sinus syndrome: Secondary | ICD-10-CM | POA: Diagnosis not present

## 2016-07-21 DIAGNOSIS — F0151 Vascular dementia with behavioral disturbance: Secondary | ICD-10-CM | POA: Diagnosis not present

## 2016-07-21 DIAGNOSIS — R634 Abnormal weight loss: Secondary | ICD-10-CM | POA: Diagnosis not present

## 2016-07-21 DIAGNOSIS — R630 Anorexia: Secondary | ICD-10-CM | POA: Diagnosis not present

## 2016-07-22 DIAGNOSIS — E46 Unspecified protein-calorie malnutrition: Secondary | ICD-10-CM | POA: Diagnosis not present

## 2016-07-22 DIAGNOSIS — R634 Abnormal weight loss: Secondary | ICD-10-CM | POA: Diagnosis not present

## 2016-07-22 DIAGNOSIS — F0151 Vascular dementia with behavioral disturbance: Secondary | ICD-10-CM | POA: Diagnosis not present

## 2016-07-22 DIAGNOSIS — R630 Anorexia: Secondary | ICD-10-CM | POA: Diagnosis not present

## 2016-07-22 DIAGNOSIS — I4891 Unspecified atrial fibrillation: Secondary | ICD-10-CM | POA: Diagnosis not present

## 2016-07-22 DIAGNOSIS — I495 Sick sinus syndrome: Secondary | ICD-10-CM | POA: Diagnosis not present

## 2016-07-26 DIAGNOSIS — R630 Anorexia: Secondary | ICD-10-CM | POA: Diagnosis not present

## 2016-07-26 DIAGNOSIS — E46 Unspecified protein-calorie malnutrition: Secondary | ICD-10-CM | POA: Diagnosis not present

## 2016-07-26 DIAGNOSIS — R634 Abnormal weight loss: Secondary | ICD-10-CM | POA: Diagnosis not present

## 2016-07-26 DIAGNOSIS — I495 Sick sinus syndrome: Secondary | ICD-10-CM | POA: Diagnosis not present

## 2016-07-26 DIAGNOSIS — F0151 Vascular dementia with behavioral disturbance: Secondary | ICD-10-CM | POA: Diagnosis not present

## 2016-07-26 DIAGNOSIS — I4891 Unspecified atrial fibrillation: Secondary | ICD-10-CM | POA: Diagnosis not present

## 2016-07-27 DIAGNOSIS — F0151 Vascular dementia with behavioral disturbance: Secondary | ICD-10-CM | POA: Diagnosis not present

## 2016-07-27 DIAGNOSIS — I4891 Unspecified atrial fibrillation: Secondary | ICD-10-CM | POA: Diagnosis not present

## 2016-07-27 DIAGNOSIS — R634 Abnormal weight loss: Secondary | ICD-10-CM | POA: Diagnosis not present

## 2016-07-27 DIAGNOSIS — E46 Unspecified protein-calorie malnutrition: Secondary | ICD-10-CM | POA: Diagnosis not present

## 2016-07-27 DIAGNOSIS — R630 Anorexia: Secondary | ICD-10-CM | POA: Diagnosis not present

## 2016-07-27 DIAGNOSIS — I495 Sick sinus syndrome: Secondary | ICD-10-CM | POA: Diagnosis not present

## 2016-08-01 DIAGNOSIS — R630 Anorexia: Secondary | ICD-10-CM | POA: Diagnosis not present

## 2016-08-01 DIAGNOSIS — I10 Essential (primary) hypertension: Secondary | ICD-10-CM | POA: Diagnosis not present

## 2016-08-01 DIAGNOSIS — R634 Abnormal weight loss: Secondary | ICD-10-CM | POA: Diagnosis not present

## 2016-08-01 DIAGNOSIS — F0151 Vascular dementia with behavioral disturbance: Secondary | ICD-10-CM | POA: Diagnosis not present

## 2016-08-01 DIAGNOSIS — E785 Hyperlipidemia, unspecified: Secondary | ICD-10-CM | POA: Diagnosis not present

## 2016-08-01 DIAGNOSIS — I4891 Unspecified atrial fibrillation: Secondary | ICD-10-CM | POA: Diagnosis not present

## 2016-08-01 DIAGNOSIS — F339 Major depressive disorder, recurrent, unspecified: Secondary | ICD-10-CM | POA: Diagnosis not present

## 2016-08-01 DIAGNOSIS — H3530 Unspecified macular degeneration: Secondary | ICD-10-CM | POA: Diagnosis not present

## 2016-08-01 DIAGNOSIS — I495 Sick sinus syndrome: Secondary | ICD-10-CM | POA: Diagnosis not present

## 2016-08-01 DIAGNOSIS — E1159 Type 2 diabetes mellitus with other circulatory complications: Secondary | ICD-10-CM | POA: Diagnosis not present

## 2016-08-01 DIAGNOSIS — E039 Hypothyroidism, unspecified: Secondary | ICD-10-CM | POA: Diagnosis not present

## 2016-08-01 DIAGNOSIS — M84359S Stress fracture, hip, unspecified, sequela: Secondary | ICD-10-CM | POA: Diagnosis not present

## 2016-08-01 DIAGNOSIS — E46 Unspecified protein-calorie malnutrition: Secondary | ICD-10-CM | POA: Diagnosis not present

## 2016-08-03 DIAGNOSIS — I4891 Unspecified atrial fibrillation: Secondary | ICD-10-CM | POA: Diagnosis not present

## 2016-08-03 DIAGNOSIS — R630 Anorexia: Secondary | ICD-10-CM | POA: Diagnosis not present

## 2016-08-03 DIAGNOSIS — I495 Sick sinus syndrome: Secondary | ICD-10-CM | POA: Diagnosis not present

## 2016-08-03 DIAGNOSIS — F0151 Vascular dementia with behavioral disturbance: Secondary | ICD-10-CM | POA: Diagnosis not present

## 2016-08-03 DIAGNOSIS — E46 Unspecified protein-calorie malnutrition: Secondary | ICD-10-CM | POA: Diagnosis not present

## 2016-08-03 DIAGNOSIS — R634 Abnormal weight loss: Secondary | ICD-10-CM | POA: Diagnosis not present

## 2016-08-08 DIAGNOSIS — I4891 Unspecified atrial fibrillation: Secondary | ICD-10-CM | POA: Diagnosis not present

## 2016-08-08 DIAGNOSIS — I495 Sick sinus syndrome: Secondary | ICD-10-CM | POA: Diagnosis not present

## 2016-08-08 DIAGNOSIS — R634 Abnormal weight loss: Secondary | ICD-10-CM | POA: Diagnosis not present

## 2016-08-08 DIAGNOSIS — R630 Anorexia: Secondary | ICD-10-CM | POA: Diagnosis not present

## 2016-08-08 DIAGNOSIS — F0151 Vascular dementia with behavioral disturbance: Secondary | ICD-10-CM | POA: Diagnosis not present

## 2016-08-08 DIAGNOSIS — E46 Unspecified protein-calorie malnutrition: Secondary | ICD-10-CM | POA: Diagnosis not present

## 2016-08-10 ENCOUNTER — Non-Acute Institutional Stay (SKILLED_NURSING_FACILITY): Payer: Medicare Other | Admitting: Nurse Practitioner

## 2016-08-10 ENCOUNTER — Encounter: Payer: Self-pay | Admitting: Nurse Practitioner

## 2016-08-10 DIAGNOSIS — I10 Essential (primary) hypertension: Secondary | ICD-10-CM

## 2016-08-10 DIAGNOSIS — I481 Persistent atrial fibrillation: Secondary | ICD-10-CM

## 2016-08-10 DIAGNOSIS — E113419 Type 2 diabetes mellitus with severe nonproliferative diabetic retinopathy with macular edema, unspecified eye: Secondary | ICD-10-CM | POA: Diagnosis not present

## 2016-08-10 DIAGNOSIS — K5900 Constipation, unspecified: Secondary | ICD-10-CM | POA: Diagnosis not present

## 2016-08-10 DIAGNOSIS — E032 Hypothyroidism due to medicaments and other exogenous substances: Secondary | ICD-10-CM

## 2016-08-10 DIAGNOSIS — R627 Adult failure to thrive: Secondary | ICD-10-CM

## 2016-08-10 DIAGNOSIS — H547 Unspecified visual loss: Secondary | ICD-10-CM

## 2016-08-10 DIAGNOSIS — I4819 Other persistent atrial fibrillation: Secondary | ICD-10-CM

## 2016-08-10 DIAGNOSIS — F0281 Dementia in other diseases classified elsewhere with behavioral disturbance: Secondary | ICD-10-CM | POA: Diagnosis not present

## 2016-08-10 DIAGNOSIS — F325 Major depressive disorder, single episode, in full remission: Secondary | ICD-10-CM

## 2016-08-10 DIAGNOSIS — F02818 Dementia in other diseases classified elsewhere, unspecified severity, with other behavioral disturbance: Secondary | ICD-10-CM

## 2016-08-10 NOTE — Progress Notes (Signed)
[facility]:  [facility] Room Number: 30 Place of Service:  SNF (31) Provider:  Camira Geidel, Manxie  NP  Murray HodgkinsArthur Green, MD  Patient Care Team: Kimber RelicArthur G Green, MD as PCP - General (Internal Medicine) Rosmary Dionisio Johnney OuX Nasiyah Laverdiere, NP as Nurse Practitioner (Nurse Practitioner) [facility] Harle StanfordKimbrough Jr., MD as Consulting Physician (Urology) Rachael Feeaniel P Jacobs, MD as Consulting Physician (Gastroenterology) Salvatore Marvelobert Wainer, MD as Consulting Physician (Orthopedic Surgery) Venancio PoissonLaura Lomax, MD as Consulting Physician (Dermatology) Sheral Apleyimothy D Murphy, MD as Attending Physician (Orthopedic Surgery)  Extended Emergency Contact Information Primary Emergency Contact: Cook,[name] Address: [address]          [place], [place] [address] [place] Home Phone: [phone] Work Phone: [phone] Mobile Phone: [phone] Relation: Son Secondary Emergency Contact: Lintner,[name] Address: [address]          [place], [place] [address] [place] Home Phone: [phone] Relation: Relative  Code Status:  DNR Goals of care: Advanced Directive information Advanced Directives 08/10/2016  Does Patient Have a Medical Advance Directive? Yes  Type of Estate agentAdvance Directive Healthcare Power of PurvisAttorney;Living will;Out of facility DNR (pink MOST or yellow form)  Does patient want to make changes to medical advance directive? No - Patient declined  Copy of Healthcare Power of Attorney in Chart? Yes  Pre-existing out of facility DNR order (yellow form or pink MOST form) -     Chief Complaint  Patient presents with  . Medical Management of Chronic Issues    HPI:  Pt is a 83 y.o. female seen today for medical management of chronic diseases.      Hx of hypothyroidism, last TSH wnl 12/2014 off Levothyroxine 12.1045mcg. Her blood sugar is controlled on diet, last Hgb A1c 5.8 12/2014. Blood pressure and heart rate are controlled offmetoprolol. Her mood is managed on Lexapro 10mg  , dementia  requires SN for care needs, her urinary incontinent is chronic and managed with adult depends. She has BM daily. She is still able to feed self at meals. FTT persisted. Healed left superior pubic ramus fx at the acetabulum hairline extension into the hip joint. Her goal of care is comfort measures, prn Morphine, Lorazepam, Tylenol available to her.  Past Medical History:  Diagnosis Date  . Abnormality of gait 09/08/2003  . AF (atrial fibrillation) (HCC)   . Anxiety state, unspecified 08/19/2010  . Aortic stenosis   . Atrial fibrillation (HCC) 07/14/2009   Qualifier: Diagnosis of  By: Graciela HusbandsKlein, MD, Ruthann CancerFACC, Ty HiltsSteven Cochran   . Bradycardia   . Cardiac pacemaker in situ 09/12/2004  . Closed fracture of lumbar vertebra without mention of spinal cord injury [id number]  . Closed fracture of unspecified trochanteric section of femur 05/29/2011  . Constipation 03/14/2014  . Dementia 2013   04/03/14 MMSE 22/30   . Depression 04/10/2014  . Diffuse cystic mastopathy 12/23/2009  . Disturbance of salivary secretion [id number]  . Edema 08/18/2003  . Fall 12/19/2012   07/22/14 fall VS 144/76, 60, 20, 97.1. No apparent injury   . Flatulence, eructation, and gas pain 12/03/2009  . Fracture of greater trochanter of left femur (HCC) 03/08/14  . FTT (failure to thrive) in adult 12/15/2014  . Hearing loss   . Hemorrhoids   . Hip fracture (HCC)   . HTN (hypertension)   . Hypothyroidism 12/19/2007   Qualifier: Diagnosis of  By: Melvyn NethLewis CMA (AAMA), Patty  01/16/14 TSH 2.474 03/24/14 TSH 2.576    . [place] in [place]  03/20/1999   secondary to macular degeneration  . Loss of weight 10/13/2014   Multiple factorials: dementia, depression, advanced age, possible AR of medications(metformin and Oxybutynin)-will increase Mirtazapine and continue supplement. Observe.    . Macular degeneration (senile) of retina, unspecified 12/12/2001  . Macular degeneration of both eyes 01/24/2013   Severe visual impairment. Nearly blind.   .  Mitral valve prolapse   . Occlusion and stenosis of carotid artery without mention of cerebral infarction 12/27/2007  . Other and unspecified hyperlipidemia   . Other malaise and fatigue 12/06/2007  . Pacemaker-dual  medtronic 09/07/2011  . Personal history of fall 01/29/2009  . Rectal bleeding   . Senile osteoporosis 12/13/2000  . Sinus node dysfunction (HCC)   . Syncope and collapse 12/27/2007  . Trochanteric fracture of left femur (HCC) 03/14/2014   CT scan did however reveal acute fracture in greater left trochanteric. Dr. Margarita Rana consulted. Recommended nonoperative management and weightbearing as tolerated and another 3 weeks of Lovenox for DVT prophylaxis.       . Type 2 diabetes mellitus with blindness, with macular edema, with severe nonproliferative retinopathy 12/19/2007   Qualifier: Diagnosis of  By: Melvyn Neth CMA (AAMA), Patty  01/09/14 Hgb A1c 6.5 01/16/14 Hgb A1c 6.5 03/24/14 Hgb 6.4 12/10/14 pharm: dc Metformin and CBG monitoring-failure to thrive and refusing to eat.     Marland Kitchen Unspecified hereditary and idiopathic peripheral neuropathy 10/28/202012  . Unspecified hypothyroidism 03/18/2010  . Unspecified urinary incontinence 02/22/2005  . Urinary tract infection, site not specified 04/10/2014  . Xerophthalmus 01/21/2015   Past Surgical History:  Procedure Laterality Date  . BREAST BIOPSY Left 1970   benign  . Left hip surgery  09/2005  . PACEMAKER INSERTION  2006   Medtronic In-Pulse  . Right hip surgery  08/2003  . TONSILLECTOMY    . 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 Codeine     unknown  . Ibuprofen     unknown  . Lisinopril     unknown  . Penicillins     unknown  . Sanctura [Trospium Chloride]     unknown    Allergies as of 08/10/2016      Reactions   Codeine    unknown   Ibuprofen    unknown   Lisinopril    unknown   Penicillins    unknown   Sanctura  [trospium Chloride]    unknown      Medication List       Accurate as of 08/10/16 11:59 PM. Always use your most recent med list.          atropine 1 % ophthalmic solution Place 4 drops on tongue  as needed for excess secretions   bisacodyl 10 MG suppository Commonly known as:  DULCOLAX Place 10 mg rectally as needed for moderate constipation.   escitalopram 10 MG tablet Commonly known as:  LEXAPRO Take 10 mg by mouth daily.   LORazepam 1 MG tablet Commonly known as:  ATIVAN Take 1 mg by mouth. Take one every evening at 4 pm. Take one tablet every 2 hours as needed for anxiety .   morphine 20 MG/ML concentrated solution Commonly known as:  ROXANOL Give 0.109ml (5mg ) by mouth every two hours as needed   oseltamivir 75 MG capsule Commonly known as:  TAMIFLU Take 75 mg by mouth daily.   sennosides-docusate sodium 8.6-50 MG tablet Commonly  known as:  SENOKOT-S Take 2 tablets by mouth at bedtime. Reported on 12/11/2015   TYLENOL 325 MG tablet Generic drug:  acetaminophen Take 650 mg by mouth every 4 (four) hours as needed for mild pain or moderate pain.       Review of Systems  Constitutional: Negative for chills and fever.       Emaciated  HENT: Positive for hearing loss. Negative for congestion, ear discharge and ear pain.   Eyes: Negative for pain, discharge and redness.       Low vision Dry eyes  Respiratory: Negative for cough, shortness of breath, wheezing and stridor.        Occasional hacking cough, non productive, no O2 desat, afebrile.  Cardiovascular: Negative for chest pain, palpitations and leg swelling.  Gastrointestinal: Negative for abdominal pain, constipation, nausea and vomiting.  Genitourinary: Positive for frequency. Negative for dysuria and urgency.  Musculoskeletal: Positive for arthralgias and gait problem. Negative for back pain, myalgias and neck pain.       Left hip/leg pain.   Skin: Negative for rash.  Neurological: Negative for  dizziness, tremors, seizures, weakness and headaches.       Severre dementia  Hematological: Negative.   Psychiatric/Behavioral: Positive for confusion and decreased concentration. Negative for hallucinations and suicidal ideas. The patient is nervous/anxious.     Immunization History  Administered Date(s) Administered  . Influenza-Unspecified 06/06/2013, 04/21/2015, 05/17/2016  . PPD Test 07/23/2011  . Pneumococcal Polysaccharide-23 03/14/2009  . Td 03/14/2009  . Zoster 06/29/2006   Pertinent  Health Maintenance Due  Topic Date Due  . OPHTHALMOLOGY EXAM  07/28/1927  . URINE MICROALBUMIN  07/28/1927  . DEXA SCAN  07/27/1982  . PNA vac Low Risk Adult (2 of 2 - PCV13) 03/14/2010  . HEMOGLOBIN A1C  07/29/2015  . FOOT EXAM  01/26/2016  . INFLUENZA VACCINE  Completed   Fall Risk  01/26/2015 03/06/2014  Falls in the past year? Yes Yes  Number falls in past yr: 1 1  Injury with Fall? Yes Yes  Risk Factor Category  - High Fall Risk  Risk for fall due to : History of fall(s);Impaired balance/gait;Impaired mobility -  Follow up Falls evaluation completed -   Functional Status Survey:    Vitals:   08/10/16 1130  BP: 116/60  Pulse: 68  Resp: 18  Temp: 97.6 F (36.4 C)  Weight: 91 lb 11.2 oz (41.6 kg)  Height: 5\' 2"  (1.575 m)   Body mass index is 16.77 kg/m. Physical Exam  Constitutional:  Extremely thin  HENT:  Head: Normocephalic and atraumatic.  Right Ear: External ear normal.  Left Ear: External ear normal.  Nose: Nose normal.  Mouth/Throat: Oropharynx is clear and moist. No oropharyngeal exudate.  Eyes: Conjunctivae and EOM are normal. Pupils are equal, round, and reactive to light. Right eye exhibits no discharge. Left eye exhibits no discharge. No scleral icterus.  Dry eyes. Legally blind.  Neck: Normal range of motion. Neck supple. No JVD present. No thyromegaly present.  Cardiovascular: Normal rate.   Murmur heard. Pacemaker. Systolic ejection murmur 3/6    Pulmonary/Chest: Effort normal and breath sounds normal. No respiratory distress. She has no wheezes. She has no rales. She exhibits no tenderness.  Abdominal: Soft. Bowel sounds are normal. She exhibits no distension. There is no tenderness. There is no rebound.  Musculoskeletal: Normal range of motion. She exhibits tenderness. She exhibits no edema.  Left hip and leg pain  Lymphadenopathy:    She has no cervical  adenopathy.  Neurological: She is alert. She has normal reflexes. No cranial nerve deficit. She exhibits normal muscle tone. Coordination normal.  Skin: Skin is warm and dry. No rash noted. She is not diaphoretic. No erythema. No pallor.      Psychiatric: Thought content normal. Her mood appears not anxious. Her affect is not angry, not blunt, not labile and not inappropriate. Her speech is not rapid and/or pressured, not delayed and not slurred. She is slowed. She is not agitated, not aggressive, not hyperactive, not withdrawn, not actively hallucinating and not combative. Thought content is not paranoid and not delusional. Cognition and memory are impaired. She expresses impulsivity and inappropriate judgment. She exhibits a depressed mood. She exhibits abnormal recent memory.  Confusion, HOH, very low vision. Guards against examination. She is attentive.    Labs reviewed: No results for input(s): NA, K, CL, CO2, GLUCOSE, BUN, CREATININE, CALCIUM, MG, PHOS in the last 8760 hours. No results for input(s): AST, ALT, ALKPHOS, BILITOT, PROT, ALBUMIN in the last 8760 hours. No results for input(s): WBC, NEUTROABS, HGB, HCT, MCV, PLT in the last 8760 hours. Lab Results  Component Value Date   TSH 3.55 01/27/2015   Lab Results  Component Value Date   HGBA1C 5.8 01/27/2015   Lab Results  Component Value Date   CHOL 140 12/20/2012   HDL 38 12/20/2012   LDLCALC 57 12/20/2012   TRIG 227 (A) 12/20/2012   CHOLHDL 5.0 12/22/2007    Significant Diagnostic Results in last 30 days:   No results found.  Assessment/Plan Essential hypertension Controlled, off meds  ATRIAL FIBRILLATION Heart rate is in control, off meds  Constipation Stable, continue Senokot S II qhs, prn Dulcolax suppository.    Hypothyroidism Off Levothyroxine  Type 2 diabetes mellitus with blindness, severe nonproliferative retinopathy, and macular edema (HCC) Diet controlled.   Dementia 04/03/14 MMSE 22/30.  Comfort measures only, continue SNF,  Hospice Service, comfort care.  Continue Lorazepam q2h prn for comfort measures.    Depression Mood is managed on  Lexapro 10mg  daily  FTT (failure to thrive) in adult Persists, supportive care, prn Morphine, Ativan, Tylenol available to her.      Family/ staff Communication: SNF   Labs/tests ordered:  none

## 2016-08-10 NOTE — Assessment & Plan Note (Signed)
04/03/14 MMSE 22/30.  Comfort measures only, continue SNF,  Hospice Service, comfort care.  Continue Lorazepam q2h prn for comfort measures.

## 2016-08-10 NOTE — Assessment & Plan Note (Signed)
Controlled, off meds.  

## 2016-08-10 NOTE — Assessment & Plan Note (Signed)
Mood is managed on  Lexapro 10mg daily 

## 2016-08-10 NOTE — Assessment & Plan Note (Signed)
Diet controlled.  

## 2016-08-10 NOTE — Assessment & Plan Note (Signed)
Persists, supportive care, prn Morphine, Ativan, Tylenol available to her.  

## 2016-08-10 NOTE — Assessment & Plan Note (Signed)
Off Levothyroxine 

## 2016-08-10 NOTE — Assessment & Plan Note (Signed)
Stable, continue Senokot S II qhs, prn Dulcolax suppository.   

## 2016-08-10 NOTE — Assessment & Plan Note (Signed)
Heart rate is in control, off meds.  

## 2016-08-15 DIAGNOSIS — I495 Sick sinus syndrome: Secondary | ICD-10-CM | POA: Diagnosis not present

## 2016-08-15 DIAGNOSIS — F0151 Vascular dementia with behavioral disturbance: Secondary | ICD-10-CM | POA: Diagnosis not present

## 2016-08-15 DIAGNOSIS — R630 Anorexia: Secondary | ICD-10-CM | POA: Diagnosis not present

## 2016-08-15 DIAGNOSIS — I4891 Unspecified atrial fibrillation: Secondary | ICD-10-CM | POA: Diagnosis not present

## 2016-08-15 DIAGNOSIS — R634 Abnormal weight loss: Secondary | ICD-10-CM | POA: Diagnosis not present

## 2016-08-15 DIAGNOSIS — E46 Unspecified protein-calorie malnutrition: Secondary | ICD-10-CM | POA: Diagnosis not present

## 2016-08-22 DIAGNOSIS — F0151 Vascular dementia with behavioral disturbance: Secondary | ICD-10-CM | POA: Diagnosis not present

## 2016-08-22 DIAGNOSIS — E46 Unspecified protein-calorie malnutrition: Secondary | ICD-10-CM | POA: Diagnosis not present

## 2016-08-22 DIAGNOSIS — R634 Abnormal weight loss: Secondary | ICD-10-CM | POA: Diagnosis not present

## 2016-08-22 DIAGNOSIS — R630 Anorexia: Secondary | ICD-10-CM | POA: Diagnosis not present

## 2016-08-22 DIAGNOSIS — I4891 Unspecified atrial fibrillation: Secondary | ICD-10-CM | POA: Diagnosis not present

## 2016-08-22 DIAGNOSIS — I495 Sick sinus syndrome: Secondary | ICD-10-CM | POA: Diagnosis not present

## 2016-08-23 DIAGNOSIS — F0151 Vascular dementia with behavioral disturbance: Secondary | ICD-10-CM | POA: Diagnosis not present

## 2016-08-23 DIAGNOSIS — E46 Unspecified protein-calorie malnutrition: Secondary | ICD-10-CM | POA: Diagnosis not present

## 2016-08-23 DIAGNOSIS — I495 Sick sinus syndrome: Secondary | ICD-10-CM | POA: Diagnosis not present

## 2016-08-23 DIAGNOSIS — R630 Anorexia: Secondary | ICD-10-CM | POA: Diagnosis not present

## 2016-08-23 DIAGNOSIS — I4891 Unspecified atrial fibrillation: Secondary | ICD-10-CM | POA: Diagnosis not present

## 2016-08-23 DIAGNOSIS — R634 Abnormal weight loss: Secondary | ICD-10-CM | POA: Diagnosis not present

## 2016-08-24 DIAGNOSIS — I4891 Unspecified atrial fibrillation: Secondary | ICD-10-CM | POA: Diagnosis not present

## 2016-08-24 DIAGNOSIS — E46 Unspecified protein-calorie malnutrition: Secondary | ICD-10-CM | POA: Diagnosis not present

## 2016-08-24 DIAGNOSIS — R634 Abnormal weight loss: Secondary | ICD-10-CM | POA: Diagnosis not present

## 2016-08-24 DIAGNOSIS — F0151 Vascular dementia with behavioral disturbance: Secondary | ICD-10-CM | POA: Diagnosis not present

## 2016-08-24 DIAGNOSIS — R630 Anorexia: Secondary | ICD-10-CM | POA: Diagnosis not present

## 2016-08-24 DIAGNOSIS — I495 Sick sinus syndrome: Secondary | ICD-10-CM | POA: Diagnosis not present

## 2016-08-25 DIAGNOSIS — I4891 Unspecified atrial fibrillation: Secondary | ICD-10-CM | POA: Diagnosis not present

## 2016-08-25 DIAGNOSIS — F0151 Vascular dementia with behavioral disturbance: Secondary | ICD-10-CM | POA: Diagnosis not present

## 2016-08-25 DIAGNOSIS — E46 Unspecified protein-calorie malnutrition: Secondary | ICD-10-CM | POA: Diagnosis not present

## 2016-08-25 DIAGNOSIS — R630 Anorexia: Secondary | ICD-10-CM | POA: Diagnosis not present

## 2016-08-25 DIAGNOSIS — I495 Sick sinus syndrome: Secondary | ICD-10-CM | POA: Diagnosis not present

## 2016-08-25 DIAGNOSIS — R634 Abnormal weight loss: Secondary | ICD-10-CM | POA: Diagnosis not present

## 2016-08-29 DIAGNOSIS — R630 Anorexia: Secondary | ICD-10-CM | POA: Diagnosis not present

## 2016-08-29 DIAGNOSIS — R634 Abnormal weight loss: Secondary | ICD-10-CM | POA: Diagnosis not present

## 2016-08-29 DIAGNOSIS — F0151 Vascular dementia with behavioral disturbance: Secondary | ICD-10-CM | POA: Diagnosis not present

## 2016-08-29 DIAGNOSIS — E46 Unspecified protein-calorie malnutrition: Secondary | ICD-10-CM | POA: Diagnosis not present

## 2016-08-29 DIAGNOSIS — I4891 Unspecified atrial fibrillation: Secondary | ICD-10-CM | POA: Diagnosis not present

## 2016-08-29 DIAGNOSIS — I495 Sick sinus syndrome: Secondary | ICD-10-CM | POA: Diagnosis not present

## 2016-09-01 DIAGNOSIS — I495 Sick sinus syndrome: Secondary | ICD-10-CM | POA: Diagnosis not present

## 2016-09-01 DIAGNOSIS — E785 Hyperlipidemia, unspecified: Secondary | ICD-10-CM | POA: Diagnosis not present

## 2016-09-01 DIAGNOSIS — H3530 Unspecified macular degeneration: Secondary | ICD-10-CM | POA: Diagnosis not present

## 2016-09-01 DIAGNOSIS — M84359S Stress fracture, hip, unspecified, sequela: Secondary | ICD-10-CM | POA: Diagnosis not present

## 2016-09-01 DIAGNOSIS — F339 Major depressive disorder, recurrent, unspecified: Secondary | ICD-10-CM | POA: Diagnosis not present

## 2016-09-01 DIAGNOSIS — E039 Hypothyroidism, unspecified: Secondary | ICD-10-CM | POA: Diagnosis not present

## 2016-09-01 DIAGNOSIS — I10 Essential (primary) hypertension: Secondary | ICD-10-CM | POA: Diagnosis not present

## 2016-09-01 DIAGNOSIS — F0151 Vascular dementia with behavioral disturbance: Secondary | ICD-10-CM | POA: Diagnosis not present

## 2016-09-01 DIAGNOSIS — E46 Unspecified protein-calorie malnutrition: Secondary | ICD-10-CM | POA: Diagnosis not present

## 2016-09-01 DIAGNOSIS — R634 Abnormal weight loss: Secondary | ICD-10-CM | POA: Diagnosis not present

## 2016-09-01 DIAGNOSIS — E1159 Type 2 diabetes mellitus with other circulatory complications: Secondary | ICD-10-CM | POA: Diagnosis not present

## 2016-09-01 DIAGNOSIS — R630 Anorexia: Secondary | ICD-10-CM | POA: Diagnosis not present

## 2016-09-01 DIAGNOSIS — I4891 Unspecified atrial fibrillation: Secondary | ICD-10-CM | POA: Diagnosis not present

## 2016-09-06 DIAGNOSIS — F0151 Vascular dementia with behavioral disturbance: Secondary | ICD-10-CM | POA: Diagnosis not present

## 2016-09-06 DIAGNOSIS — E46 Unspecified protein-calorie malnutrition: Secondary | ICD-10-CM | POA: Diagnosis not present

## 2016-09-06 DIAGNOSIS — R634 Abnormal weight loss: Secondary | ICD-10-CM | POA: Diagnosis not present

## 2016-09-06 DIAGNOSIS — I4891 Unspecified atrial fibrillation: Secondary | ICD-10-CM | POA: Diagnosis not present

## 2016-09-06 DIAGNOSIS — R630 Anorexia: Secondary | ICD-10-CM | POA: Diagnosis not present

## 2016-09-06 DIAGNOSIS — I495 Sick sinus syndrome: Secondary | ICD-10-CM | POA: Diagnosis not present

## 2016-09-07 ENCOUNTER — Encounter: Payer: Self-pay | Admitting: Nurse Practitioner

## 2016-09-07 ENCOUNTER — Non-Acute Institutional Stay (SKILLED_NURSING_FACILITY): Payer: Medicare Other | Admitting: Nurse Practitioner

## 2016-09-07 DIAGNOSIS — F02818 Dementia in other diseases classified elsewhere, unspecified severity, with other behavioral disturbance: Secondary | ICD-10-CM

## 2016-09-07 DIAGNOSIS — R627 Adult failure to thrive: Secondary | ICD-10-CM

## 2016-09-07 DIAGNOSIS — K5900 Constipation, unspecified: Secondary | ICD-10-CM | POA: Diagnosis not present

## 2016-09-07 DIAGNOSIS — F325 Major depressive disorder, single episode, in full remission: Secondary | ICD-10-CM

## 2016-09-07 DIAGNOSIS — F0281 Dementia in other diseases classified elsewhere with behavioral disturbance: Secondary | ICD-10-CM

## 2016-09-07 NOTE — Assessment & Plan Note (Signed)
Comfort measures only, continue SNF,  Hospice Service, comfort care.  Continue Lorazepam q2h prn for comfort measures.

## 2016-09-07 NOTE — Assessment & Plan Note (Signed)
Persists, supportive care, prn Morphine, Ativan, Tylenol available to her.  

## 2016-09-07 NOTE — Progress Notes (Signed)
Location:  [facility] Room Number: 30 Place of Service:  SNF (31) Provider:  Sheniece Ruggles, Manxie  NP  Murray Hodgkins, MD  Patient Care Team: Kimber Relic, MD as PCP - General (Internal Medicine) Marquarius Lofton Johnney Ou, NP as Nurse Practitioner (Nurse Practitioner) [facility] Harle Stanford., MD as Consulting Physician (Urology) Rachael Fee, MD as Consulting Physician (Gastroenterology) Salvatore Marvel, MD as Consulting Physician (Orthopedic Surgery) Venancio Poisson, MD as Consulting Physician (Dermatology) Sheral Apley, MD as Attending Physician (Orthopedic Surgery)  Extended Emergency Contact Information Primary Emergency Contact: Cook,[name] Address: [address]          [place], [place] [address] [place] Home Phone: [phone] Work Phone: [phone] Mobile Phone: [phone] Relation: Son Secondary Emergency Contact: Gamel,[name] Address: [address]          [place], [place] [address] [place] Home Phone: [phone] Relation: Relative  Code Status:  DNR Goals of care: Advanced Directive information Advanced Directives 09/07/2016  Does Patient Have a Medical Advance Directive? Yes  Type of Estate agent of [place];Living will;Out of facility DNR (pink MOST or yellow form)  Does patient want to make changes to medical advance directive? No - Patient declined  Copy of Healthcare Power of Attorney in Chart? Yes  Pre-existing out of facility DNR order (yellow form or pink MOST form) Yellow form placed in chart (order not valid for inpatient use)     Chief Complaint  Patient presents with  . Medical Management of Chronic Issues    HPI:  Pt is a 83 y.o. female seen today for medical management of chronic diseases.     Hx of hypothyroidism, last TSH wnl 12/2014 off Levothyroxine 12.46mcg. Her blood sugar is controlled on diet, last Hgb A1c 5.8 12/2014. Blood pressure and heart rate are controlled  offmetoprolol. Her mood is managed on Lexapro 10mg  , dementia requires SN for care needs, her urinary incontinent is chronic and managed with adult depends. She has BM daily. She is still able to feed self at meals. FTT persisted. Healed left superior pubic ramus fx at the acetabulum hairline extension into the hip joint. Her goal of care is comfort measures, prn Morphine, Lorazepam, Tylenol available to her.  Past Medical History:  Diagnosis Date  . Abnormality of gait 09/08/2003  . AF (atrial fibrillation) (HCC)   . Anxiety state, unspecified 08/19/2010  . Aortic stenosis   . Atrial fibrillation (HCC) 07/14/2009   Qualifier: Diagnosis of  By: [name], MD, Ruthann Cancer Ty Hilts   . Bradycardia   . Cardiac pacemaker in situ 09/12/2004  . Closed fracture of lumbar vertebra without mention of spinal cord injury [id number]  . Closed fracture of unspecified trochanteric section of femur 05/29/2011  . Constipation 03/14/2014  . Dementia 2013   04/03/14 MMSE 22/30   . Depression 04/10/2014  . Diffuse cystic mastopathy 12/23/2009  . Disturbance of salivary secretion [id number]  . Edema 08/18/2003  . Fall 12/19/2012   07/22/14 fall VS 144/76, 60, 20, 97.1. No apparent injury   . Flatulence, eructation, and gas pain 12/03/2009  . Fracture of greater trochanter of left femur (HCC) 03/08/14  . FTT (failure to thrive) in adult 12/15/2014  . Hearing loss   . Hemorrhoids   . Hip fracture (HCC)   . HTN (hypertension)   . Hypothyroidism 12/19/2007   Qualifier: Diagnosis of  By: Melvyn Neth CMA (AAMA), Patty  01/16/14 TSH 2.474 03/24/14 TSH 2.576    .  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 Loss of weight 10/13/2014   Multiple factorials: dementia, depression, advanced age, possible AR of medications(metformin and Oxybutynin)-will increase Mirtazapine and continue supplement. Observe.    . Macular degeneration (senile) of retina, unspecified 12/12/2001  . Macular degeneration of both  eyes 01/24/2013   Severe visual impairment. Nearly blind.   . Mitral valve prolapse   . Occlusion and stenosis of carotid artery without mention of cerebral infarction 12/27/2007  . Other and unspecified hyperlipidemia   . Other malaise and fatigue 12/06/2007  . Pacemaker-dual  medtronic 09/07/2011  . Personal history of fall 01/29/2009  . Rectal bleeding   . Senile osteoporosis 12/13/2000  . Sinus node dysfunction (HCC)   . Syncope and collapse 12/27/2007  . Trochanteric fracture of left femur (HCC) 03/14/2014   CT scan did however reveal acute fracture in greater left trochanteric. Dr. Margarita Rana consulted. Recommended nonoperative management and weightbearing as tolerated and another 3 weeks of Lovenox for DVT prophylaxis.       . Type 2 diabetes mellitus with blindness, with macular edema, with severe nonproliferative retinopathy 12/19/2007   Qualifier: Diagnosis of  By: Melvyn Neth CMA (AAMA), Patty  01/09/14 Hgb A1c 6.5 01/16/14 Hgb A1c 6.5 03/24/14 Hgb 6.4 12/10/14 pharm: dc Metformin and CBG monitoring-failure to thrive and refusing to eat.     Marland Kitchen Unspecified hereditary and idiopathic peripheral neuropathy 10/28/202012  . Unspecified hypothyroidism 03/18/2010  . Unspecified urinary incontinence 02/22/2005  . Urinary tract infection, site not specified 04/10/2014  . Xerophthalmus 01/21/2015   Past Surgical History:  Procedure Laterality Date  . BREAST BIOPSY Left 1970   benign  . Left hip surgery  09/2005  . PACEMAKER INSERTION  2006   Medtronic In-Pulse  . Right hip surgery  08/2003  . TONSILLECTOMY    . 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 Codeine     unknown  . Ibuprofen     unknown  . Lisinopril     unknown  . Penicillins     unknown  . Sanctura [Trospium Chloride]     unknown    Allergies as of 09/07/2016      Reactions   Codeine    unknown   Ibuprofen    unknown    Lisinopril    unknown   Penicillins    unknown   Sanctura [trospium Chloride]    unknown      Medication List       Accurate as of 09/07/16  3:57 PM. Always use your most recent med list.          atropine 1 % ophthalmic solution Place 4 drops on tongue  as needed for excess secretions   bisacodyl 10 MG suppository Commonly known as:  DULCOLAX Place 10 mg rectally as needed for moderate constipation.   escitalopram 10 MG tablet Commonly known as:  LEXAPRO Take 10 mg by mouth daily.   LORazepam 1 MG tablet Commonly known as:  ATIVAN Take 1 mg by mouth. Take one every evening at 4 pm. Take one tablet every 2 hours as needed for anxiety .   morphine 20 MG/ML concentrated solution Commonly known as:  ROXANOL Give 0.105ml (5mg ) by mouth every two hours as needed   sennosides-docusate sodium 8.6-50 MG tablet Commonly known as:  SENOKOT-S Take 2 tablets by mouth at  bedtime. Reported on 12/11/2015   TYLENOL 325 MG tablet Generic drug:  acetaminophen Take 650 mg by mouth every 4 (four) hours as needed for mild pain or moderate pain.       Review of Systems  Constitutional: Negative for chills and fever.       Emaciated  HENT: Positive for hearing loss. Negative for congestion, ear discharge and ear pain.   Eyes: Negative for pain, discharge and redness.       Low vision Dry eyes  Respiratory: Negative for cough, shortness of breath, wheezing and stridor.        Occasional hacking cough, non productive, no O2 desat, afebrile.  Cardiovascular: Negative for chest pain, palpitations and leg swelling.  Gastrointestinal: Negative for abdominal pain, constipation, nausea and vomiting.  Genitourinary: Positive for frequency. Negative for dysuria and urgency.  Musculoskeletal: Positive for arthralgias and gait problem. Negative for back pain, myalgias and neck pain.       Left hip/leg pain.   Skin: Negative for rash.  Neurological: Negative for dizziness, tremors, seizures,  weakness and headaches.       Severre dementia  Hematological: Negative.   Psychiatric/Behavioral: Positive for confusion and decreased concentration. Negative for hallucinations and suicidal ideas. The patient is nervous/anxious.     Immunization History  Administered Date(s) Administered  . Influenza-Unspecified 06/06/2013, 04/21/2015, 05/17/2016  . PPD Test 07/23/2011  . Pneumococcal Polysaccharide-23 03/14/2009  . Td 03/14/2009  . Zoster 06/29/2006   Pertinent  Health Maintenance Due  Topic Date Due  . OPHTHALMOLOGY EXAM  07/28/1927  . URINE MICROALBUMIN  07/28/1927  . DEXA SCAN  07/27/1982  . PNA vac Low Risk Adult (2 of 2 - PCV13) 03/14/2010  . HEMOGLOBIN A1C  07/29/2015  . FOOT EXAM  01/26/2016  . INFLUENZA VACCINE  Completed   Fall Risk  01/26/2015 03/06/2014  Falls in the past year? Yes Yes  Number falls in past yr: 1 1  Injury with Fall? Yes Yes  Risk Factor Category  - High Fall Risk  Risk for fall due to : History of fall(s);Impaired balance/gait;Impaired mobility -  Follow up Falls evaluation completed -   Functional Status Survey:    Vitals:   09/07/16 1531  BP: 130/72  Pulse: 60  Resp: 18  Temp: 97.1 F (36.2 C)  Weight: 90 lb 3.2 oz (40.9 kg)  Height: 5\' 2"  (1.575 m)   Body mass index is 16.5 kg/m. Physical Exam  Labs reviewed: No results for input(s): NA, K, CL, CO2, GLUCOSE, BUN, CREATININE, CALCIUM, MG, PHOS in the last 8760 hours. No results for input(s): AST, ALT, ALKPHOS, BILITOT, PROT, ALBUMIN in the last 8760 hours. No results for input(s): WBC, NEUTROABS, HGB, HCT, MCV, PLT in the last 8760 hours. Lab Results  Component Value Date   TSH 3.55 01/27/2015   Lab Results  Component Value Date   HGBA1C 5.8 01/27/2015   Lab Results  Component Value Date   CHOL 140 12/20/2012   HDL 38 12/20/2012   LDLCALC 57 12/20/2012   TRIG 227 (A) 12/20/2012   CHOLHDL 5.0 12/22/2007    Significant Diagnostic Results in last 30 days:  No results  found.  Assessment/Plan Constipation Stable, continue Senokot S II qhs, prn Dulcolax suppository.   Depression Mood is managed on  Lexapro 10mg  daily  Dementia Comfort measures only, continue SNF,  Hospice Service, comfort care.  Continue Lorazepam q2h prn for comfort measures.    FTT (failure to thrive) in adult Persists, supportive care,  prn Morphine, Ativan, Tylenol available to her.     Family/ staff Communication: SNF  Labs/tests ordered:  none

## 2016-09-07 NOTE — Assessment & Plan Note (Signed)
Mood is managed on  Lexapro 10mg daily 

## 2016-09-07 NOTE — Assessment & Plan Note (Signed)
Stable, continue Senokot S II qhs, prn Dulcolax suppository.

## 2016-09-08 DIAGNOSIS — E46 Unspecified protein-calorie malnutrition: Secondary | ICD-10-CM | POA: Diagnosis not present

## 2016-09-08 DIAGNOSIS — R634 Abnormal weight loss: Secondary | ICD-10-CM | POA: Diagnosis not present

## 2016-09-08 DIAGNOSIS — I495 Sick sinus syndrome: Secondary | ICD-10-CM | POA: Diagnosis not present

## 2016-09-08 DIAGNOSIS — F0151 Vascular dementia with behavioral disturbance: Secondary | ICD-10-CM | POA: Diagnosis not present

## 2016-09-08 DIAGNOSIS — R630 Anorexia: Secondary | ICD-10-CM | POA: Diagnosis not present

## 2016-09-08 DIAGNOSIS — I4891 Unspecified atrial fibrillation: Secondary | ICD-10-CM | POA: Diagnosis not present

## 2016-09-13 DIAGNOSIS — R634 Abnormal weight loss: Secondary | ICD-10-CM | POA: Diagnosis not present

## 2016-09-13 DIAGNOSIS — F0151 Vascular dementia with behavioral disturbance: Secondary | ICD-10-CM | POA: Diagnosis not present

## 2016-09-13 DIAGNOSIS — I4891 Unspecified atrial fibrillation: Secondary | ICD-10-CM | POA: Diagnosis not present

## 2016-09-13 DIAGNOSIS — I495 Sick sinus syndrome: Secondary | ICD-10-CM | POA: Diagnosis not present

## 2016-09-13 DIAGNOSIS — E46 Unspecified protein-calorie malnutrition: Secondary | ICD-10-CM | POA: Diagnosis not present

## 2016-09-13 DIAGNOSIS — R630 Anorexia: Secondary | ICD-10-CM | POA: Diagnosis not present

## 2016-09-20 DIAGNOSIS — I495 Sick sinus syndrome: Secondary | ICD-10-CM | POA: Diagnosis not present

## 2016-09-20 DIAGNOSIS — E46 Unspecified protein-calorie malnutrition: Secondary | ICD-10-CM | POA: Diagnosis not present

## 2016-09-20 DIAGNOSIS — F0151 Vascular dementia with behavioral disturbance: Secondary | ICD-10-CM | POA: Diagnosis not present

## 2016-09-20 DIAGNOSIS — I4891 Unspecified atrial fibrillation: Secondary | ICD-10-CM | POA: Diagnosis not present

## 2016-09-20 DIAGNOSIS — R630 Anorexia: Secondary | ICD-10-CM | POA: Diagnosis not present

## 2016-09-20 DIAGNOSIS — R634 Abnormal weight loss: Secondary | ICD-10-CM | POA: Diagnosis not present

## 2016-09-22 DIAGNOSIS — I4891 Unspecified atrial fibrillation: Secondary | ICD-10-CM | POA: Diagnosis not present

## 2016-09-22 DIAGNOSIS — F0151 Vascular dementia with behavioral disturbance: Secondary | ICD-10-CM | POA: Diagnosis not present

## 2016-09-22 DIAGNOSIS — I495 Sick sinus syndrome: Secondary | ICD-10-CM | POA: Diagnosis not present

## 2016-09-22 DIAGNOSIS — E46 Unspecified protein-calorie malnutrition: Secondary | ICD-10-CM | POA: Diagnosis not present

## 2016-09-22 DIAGNOSIS — R630 Anorexia: Secondary | ICD-10-CM | POA: Diagnosis not present

## 2016-09-22 DIAGNOSIS — R634 Abnormal weight loss: Secondary | ICD-10-CM | POA: Diagnosis not present

## 2016-09-24 DIAGNOSIS — F0151 Vascular dementia with behavioral disturbance: Secondary | ICD-10-CM | POA: Diagnosis not present

## 2016-09-24 DIAGNOSIS — R634 Abnormal weight loss: Secondary | ICD-10-CM | POA: Diagnosis not present

## 2016-09-24 DIAGNOSIS — E46 Unspecified protein-calorie malnutrition: Secondary | ICD-10-CM | POA: Diagnosis not present

## 2016-09-24 DIAGNOSIS — R630 Anorexia: Secondary | ICD-10-CM | POA: Diagnosis not present

## 2016-09-24 DIAGNOSIS — I4891 Unspecified atrial fibrillation: Secondary | ICD-10-CM | POA: Diagnosis not present

## 2016-09-24 DIAGNOSIS — I495 Sick sinus syndrome: Secondary | ICD-10-CM | POA: Diagnosis not present

## 2016-09-26 DIAGNOSIS — I4891 Unspecified atrial fibrillation: Secondary | ICD-10-CM | POA: Diagnosis not present

## 2016-09-26 DIAGNOSIS — F0151 Vascular dementia with behavioral disturbance: Secondary | ICD-10-CM | POA: Diagnosis not present

## 2016-09-26 DIAGNOSIS — R634 Abnormal weight loss: Secondary | ICD-10-CM | POA: Diagnosis not present

## 2016-09-26 DIAGNOSIS — I495 Sick sinus syndrome: Secondary | ICD-10-CM | POA: Diagnosis not present

## 2016-09-26 DIAGNOSIS — R630 Anorexia: Secondary | ICD-10-CM | POA: Diagnosis not present

## 2016-09-26 DIAGNOSIS — E46 Unspecified protein-calorie malnutrition: Secondary | ICD-10-CM | POA: Diagnosis not present

## 2016-09-29 DIAGNOSIS — H3530 Unspecified macular degeneration: Secondary | ICD-10-CM | POA: Diagnosis not present

## 2016-09-29 DIAGNOSIS — M84359S Stress fracture, hip, unspecified, sequela: Secondary | ICD-10-CM | POA: Diagnosis not present

## 2016-09-29 DIAGNOSIS — E785 Hyperlipidemia, unspecified: Secondary | ICD-10-CM | POA: Diagnosis not present

## 2016-09-29 DIAGNOSIS — R634 Abnormal weight loss: Secondary | ICD-10-CM | POA: Diagnosis not present

## 2016-09-29 DIAGNOSIS — E46 Unspecified protein-calorie malnutrition: Secondary | ICD-10-CM | POA: Diagnosis not present

## 2016-09-29 DIAGNOSIS — F0151 Vascular dementia with behavioral disturbance: Secondary | ICD-10-CM | POA: Diagnosis not present

## 2016-09-29 DIAGNOSIS — E1159 Type 2 diabetes mellitus with other circulatory complications: Secondary | ICD-10-CM | POA: Diagnosis not present

## 2016-09-29 DIAGNOSIS — F339 Major depressive disorder, recurrent, unspecified: Secondary | ICD-10-CM | POA: Diagnosis not present

## 2016-09-29 DIAGNOSIS — I495 Sick sinus syndrome: Secondary | ICD-10-CM | POA: Diagnosis not present

## 2016-09-29 DIAGNOSIS — R630 Anorexia: Secondary | ICD-10-CM | POA: Diagnosis not present

## 2016-09-29 DIAGNOSIS — E039 Hypothyroidism, unspecified: Secondary | ICD-10-CM | POA: Diagnosis not present

## 2016-09-29 DIAGNOSIS — I10 Essential (primary) hypertension: Secondary | ICD-10-CM | POA: Diagnosis not present

## 2016-09-29 DIAGNOSIS — I4891 Unspecified atrial fibrillation: Secondary | ICD-10-CM | POA: Diagnosis not present

## 2016-10-03 ENCOUNTER — Non-Acute Institutional Stay (SKILLED_NURSING_FACILITY): Payer: Medicare Other | Admitting: Nurse Practitioner

## 2016-10-03 ENCOUNTER — Encounter: Payer: Self-pay | Admitting: Nurse Practitioner

## 2016-10-03 DIAGNOSIS — F0281 Dementia in other diseases classified elsewhere with behavioral disturbance: Secondary | ICD-10-CM

## 2016-10-03 DIAGNOSIS — I10 Essential (primary) hypertension: Secondary | ICD-10-CM | POA: Diagnosis not present

## 2016-10-03 DIAGNOSIS — K5900 Constipation, unspecified: Secondary | ICD-10-CM | POA: Diagnosis not present

## 2016-10-03 DIAGNOSIS — F325 Major depressive disorder, single episode, in full remission: Secondary | ICD-10-CM | POA: Diagnosis not present

## 2016-10-03 DIAGNOSIS — F02818 Dementia in other diseases classified elsewhere, unspecified severity, with other behavioral disturbance: Secondary | ICD-10-CM

## 2016-10-03 DIAGNOSIS — I481 Persistent atrial fibrillation: Secondary | ICD-10-CM

## 2016-10-03 DIAGNOSIS — I4819 Other persistent atrial fibrillation: Secondary | ICD-10-CM

## 2016-10-03 DIAGNOSIS — G894 Chronic pain syndrome: Secondary | ICD-10-CM

## 2016-10-03 DIAGNOSIS — E032 Hypothyroidism due to medicaments and other exogenous substances: Secondary | ICD-10-CM | POA: Diagnosis not present

## 2016-10-03 NOTE — Assessment & Plan Note (Signed)
Off Levothyroxine

## 2016-10-03 NOTE — Assessment & Plan Note (Signed)
Multiple sites, prn Morphine and Tylenol available to her to manage pain.

## 2016-10-03 NOTE — Assessment & Plan Note (Signed)
Continue Lorazepam q2h prn for comfort measures.

## 2016-10-03 NOTE — Progress Notes (Signed)
[facility]  [facility] Room Number: 30 Place of Service:  SNF (31) Provider:  Mast, Manxie  NP  Murray HodgkinsArthur Green, MD  Patient Care Team: Kimber RelicArthur G Green, MD as PCP - General (Internal Medicine) Man Johnney OuX Mast, NP as Nurse Practitioner (Nurse Practitioner) [facility] Harle StanfordKimbrough Jr., MD as Consulting Physician (Urology) Rachael Feeaniel P Jacobs, MD as Consulting Physician (Gastroenterology) Salvatore Marvelobert Wainer, MD as Consulting Physician (Orthopedic Surgery) Venancio PoissonLaura Lomax, MD as Consulting Physician (Dermatology) Sheral Apleyimothy D Murphy, MD as Attending Physician (Orthopedic Surgery)  Extended Emergency Contact Information Primary Emergency Contact: Cook,[name] Address: [address]          [place], [place] [address] [place] Home Phone: [phone] Work Phone: [phone] Mobile Phone: [phone] Relation: Son Secondary Emergency Contact: Fontaine,[name] Address: [address]          [place], [place] [address] [place] Home Phone: [phone] Relation: Relative  Code Status:  DNR Goals of care: Advanced Directive information Advanced Directives 10/03/2016  Does Patient Have a Medical Advance Directive? Yes  Type of Estate agentAdvance Directive Healthcare Power of TrinityAttorney;Living will;Out of facility DNR (pink MOST or yellow form)  Does patient want to make changes to medical advance directive? No - Patient declined  Copy of Healthcare Power of Attorney in Chart? Yes  Pre-existing out of facility DNR order (yellow form or pink MOST form) Yellow form placed in chart (order not valid for inpatient use)     Chief Complaint  Patient presents with  . Medical Management of Chronic Issues    HPI:  Pt is a 83 y.o. female seen today for medical management of chronic diseases.    Hx of hypothyroidism, last TSH wnl 12/2014 off Levothyroxine 12.685mcg. Her blood sugar is controlled on diet, last Hgb A1c 5.8 12/2014. Blood pressure and heart rate are controlled  offmetoprolol. Her mood is managed on Lexapro 10mg  , dementia requires SNF for care needs, her urinary incontinent is chronic and managed with adult depends. She has BM daily. She is still able to feed self at meals. FTT persisted. Healed left superior pubic ramus fx at the acetabulum hairline extension into the hip joint. Her goal of care is comfort measures, prn Morphine, Lorazepam, Tylenol available to her.   Past Medical History:  Diagnosis Date  . Abnormality of gait 09/08/2003  . AF (atrial fibrillation) (HCC)   . Anxiety state, unspecified 08/19/2010  . Aortic stenosis   . Atrial fibrillation (HCC) 07/14/2009   Qualifier: Diagnosis of  By: Graciela HusbandsKlein, MD, Ruthann CancerFACC, Ty HiltsSteven Cochran   . Bradycardia   . Cardiac pacemaker in situ 09/12/2004  . Closed fracture of lumbar vertebra without mention of spinal cord injury 102/04/202010  . Closed fracture of unspecified trochanteric section of femur 05/29/2011  . Constipation 03/14/2014  . Dementia 2013   04/03/14 MMSE 22/30   . Depression 04/10/2014  . Diffuse cystic mastopathy 12/23/2009  . Disturbance of salivary secretion [id number]  . Edema 08/18/2003  . Fall 12/19/2012   07/22/14 fall VS 144/76, 60, 20, 97.1. No apparent injury   . Flatulence, eructation, and gas pain 12/03/2009  . Fracture of greater trochanter of left femur (HCC) 03/08/14  . FTT (failure to thrive) in adult 12/15/2014  . Hearing loss   . Hemorrhoids   . Hip fracture (HCC)   . HTN (hypertension)   . Hypothyroidism 12/19/2007   Qualifier: Diagnosis of  By: Melvyn NethLewis CMA (AAMA), Patty  01/16/14 TSH 2.474 03/24/14 TSH 2.576    .  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 Loss of weight 10/13/2014   Multiple factorials: dementia, depression, advanced age, possible AR of medications(metformin and Oxybutynin)-will increase Mirtazapine and continue supplement. Observe.    . Macular degeneration (senile) of retina, unspecified 12/12/2001  . Macular degeneration of  both eyes 01/24/2013   Severe visual impairment. Nearly blind.   . Mitral valve prolapse   . Occlusion and stenosis of carotid artery without mention of cerebral infarction 12/27/2007  . Other and unspecified hyperlipidemia   . Other malaise and fatigue 12/06/2007  . Pacemaker-dual  medtronic 09/07/2011  . Personal history of fall 01/29/2009  . Rectal bleeding   . Senile osteoporosis 12/13/2000  . Sinus node dysfunction (HCC)   . Syncope and collapse 12/27/2007  . Trochanteric fracture of left femur (HCC) 03/14/2014   CT scan did however reveal acute fracture in greater left trochanteric. Dr. Margarita Rana consulted. Recommended nonoperative management and weightbearing as tolerated and another 3 weeks of Lovenox for DVT prophylaxis.       . Type 2 diabetes mellitus with blindness, with macular edema, with severe nonproliferative retinopathy 12/19/2007   Qualifier: Diagnosis of  By: Melvyn Neth CMA (AAMA), Patty  01/09/14 Hgb A1c 6.5 01/16/14 Hgb A1c 6.5 03/24/14 Hgb 6.4 12/10/14 pharm: dc Metformin and CBG monitoring-failure to thrive and refusing to eat.     Marland Kitchen Unspecified hereditary and idiopathic peripheral neuropathy 10/28/202012  . Unspecified hypothyroidism 03/18/2010  . Unspecified urinary incontinence 02/22/2005  . Urinary tract infection, site not specified 04/10/2014  . Xerophthalmus 01/21/2015   Past Surgical History:  Procedure Laterality Date  . BREAST BIOPSY Left 1970   benign  . Left hip surgery  09/2005  . PACEMAKER INSERTION  2006   Medtronic In-Pulse  . Right hip surgery  08/2003  . TONSILLECTOMY    . 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 Codeine     unknown  . Ibuprofen     unknown  . Lisinopril     unknown  . Penicillins     unknown  . Sanctura [Trospium Chloride]     unknown    Allergies as of 10/03/2016      Reactions   Codeine    unknown   Ibuprofen    unknown    Lisinopril    unknown   Penicillins    unknown   Sanctura [trospium Chloride]    unknown      Medication List       Accurate as of 10/03/16  1:50 PM. Always use your most recent med list.          atropine 1 % ophthalmic solution Place 4 drops on tongue  as needed for excess secretions   bisacodyl 10 MG suppository Commonly known as:  DULCOLAX Place 10 mg rectally as needed for moderate constipation.   escitalopram 10 MG tablet Commonly known as:  LEXAPRO Take 10 mg by mouth daily.   LORazepam 1 MG tablet Commonly known as:  ATIVAN Take 1 mg by mouth. Take one every evening at 4 pm. Take one tablet every 2 hours as needed for anxiety .   morphine 20 MG/ML concentrated solution Commonly known as:  ROXANOL Give 0.50ml (5mg ) by mouth every two hours as needed   sennosides-docusate sodium 8.6-50 MG tablet Commonly known as:  SENOKOT-S Take 2 tablets by mouth at  bedtime. Reported on 12/11/2015   TYLENOL 325 MG tablet Generic drug:  acetaminophen Take 650 mg by mouth every 4 (four) hours as needed for mild pain or moderate pain.       Review of Systems  Constitutional: Negative for chills and fever.       Emaciated  HENT: Positive for hearing loss. Negative for congestion, ear discharge and ear pain.   Eyes: Negative for pain, discharge and redness.       Low vision Dry eyes  Respiratory: Negative for cough, shortness of breath, wheezing and stridor.        Occasional hacking cough, non productive, no O2 desat, afebrile.  Cardiovascular: Negative for chest pain, palpitations and leg swelling.  Gastrointestinal: Negative for abdominal pain, constipation, nausea and vomiting.  Genitourinary: Positive for frequency. Negative for dysuria and urgency.  Musculoskeletal: Positive for arthralgias and gait problem. Negative for back pain, myalgias and neck pain.       Left hip/leg pain.   Skin: Negative for rash.  Neurological: Negative for dizziness, tremors, seizures,  weakness and headaches.       Severre dementia  Hematological: Negative.   Psychiatric/Behavioral: Positive for confusion and decreased concentration. Negative for hallucinations and suicidal ideas. The patient is nervous/anxious.     Immunization History  Administered Date(s) Administered  . Influenza-Unspecified 06/06/2013, 04/21/2015, 05/17/2016  . PPD Test 07/23/2011  . Pneumococcal Polysaccharide-23 03/14/2009  . Td 03/14/2009  . Zoster 06/29/2006   Pertinent  Health Maintenance Due  Topic Date Due  . OPHTHALMOLOGY EXAM  07[birth year]27  . URINE MICROALBUMIN  [birth year]27  . DEXA SCAN  07/27/1982  . PNA vac Low Risk Adult (2 of 2 - PCV13) 03/14/2010  . HEMOGLOBIN A1C  07/29/2015  . FOOT EXAM  01/26/2016  . INFLUENZA VACCINE  Completed   Fall Risk  01/26/2015 03/06/2014  Falls in the past year? Yes Yes  Number falls in past yr: 1 1  Injury with Fall? Yes Yes  Risk Factor Category  - High Fall Risk  Risk for fall due to : History of fall(s);Impaired balance/gait;Impaired mobility -  Follow up Falls evaluation completed -   Functional Status Survey:    Vitals:   10/03/16 1128  BP: 120/74  Pulse: 60  Resp: 20  Temp: 98.9 F (37.2 C)  Weight: 93 lb 1.6 oz (42.2 kg)  Height: 5\' 2"  (1.575 m)   Body mass index is 17.03 kg/m. Physical Exam  Constitutional:  Extremely thin  HENT:  Head: Normocephalic and atraumatic.  Right Ear: External ear normal.  Left Ear: External ear normal.  Nose: Nose normal.  Mouth/Throat: Oropharynx is clear and moist. No oropharyngeal exudate.  Eyes: Conjunctivae and EOM are normal. Pupils are equal, round, and reactive to light. Right eye exhibits no discharge. Left eye exhibits no discharge. No scleral icterus.  Dry eyes. Legally blind.  Neck: Normal range of motion. Neck supple. No JVD present. No thyromegaly present.  Cardiovascular: Normal rate.   Murmur heard. Pacemaker. Systolic ejection murmur 3/6  Pulmonary/Chest: Effort normal and  breath sounds normal. No respiratory distress. She has no wheezes. She has no rales. She exhibits no tenderness.  Abdominal: Soft. Bowel sounds are normal. She exhibits no distension. There is no tenderness. There is no rebound.  Musculoskeletal: Normal range of motion. She exhibits tenderness. She exhibits no edema.  Left hip and leg pain  Lymphadenopathy:    She has no cervical adenopathy.  Neurological: She is alert. She has normal reflexes. No  cranial nerve deficit. She exhibits normal muscle tone. Coordination normal.  Skin: Skin is warm and dry. No rash noted. She is not diaphoretic. No erythema. No pallor.      Psychiatric: Thought content normal. Her mood appears not anxious. Her affect is not angry, not blunt, not labile and not inappropriate. Her speech is not rapid and/or pressured, not delayed and not slurred. She is slowed. She is not agitated, not aggressive, not hyperactive, not withdrawn, not actively hallucinating and not combative. Thought content is not paranoid and not delusional. Cognition and memory are impaired. She expresses impulsivity and inappropriate judgment. She exhibits a depressed mood. She exhibits abnormal recent memory.  Confusion, HOH, very low vision. Guards against examination. She is attentive.    Labs reviewed: No results for input(s): NA, K, CL, CO2, GLUCOSE, BUN, CREATININE, CALCIUM, MG, PHOS in the last 8760 hours. No results for input(s): AST, ALT, ALKPHOS, BILITOT, PROT, ALBUMIN in the last 8760 hours. No results for input(s): WBC, NEUTROABS, HGB, HCT, MCV, PLT in the last 8760 hours. Lab Results  Component Value Date   TSH 3.55 01/27/2015   Lab Results  Component Value Date   HGBA1C 5.8 01/27/2015   Lab Results  Component Value Date   CHOL 140 12/20/2012   HDL 38 12/20/2012   LDLCALC 57 12/20/2012   TRIG 227 (A) 12/20/2012   CHOLHDL 5.0 12/22/2007    Significant Diagnostic Results in last 30 days:  No results  found.  Assessment/Plan Constipation Stable, continue Senokot S II qhs, prn Dulcolax suppository.   Essential hypertension Controlled, off meds   ATRIAL FIBRILLATION Heart rate is in control, off meds  Hypothyroidism Off Levothyroxine  Dementia Continue Lorazepam q2h prn for comfort measures.   Depression Mood is managed on  Lexapro 10mg  daily  Chronic pain syndrome Multiple sites, prn Morphine and Tylenol available to her to manage pain.      Family/ staff Communication: SNF  Labs/tests ordered:  none

## 2016-10-03 NOTE — Assessment & Plan Note (Signed)
Stable, continue Senokot S II qhs, prn Dulcolax suppository.

## 2016-10-03 NOTE — Assessment & Plan Note (Signed)
Mood is managed on  Lexapro 10mg daily 

## 2016-10-03 NOTE — Assessment & Plan Note (Signed)
Heart rate is in control, off meds.  

## 2016-10-03 NOTE — Assessment & Plan Note (Signed)
Controlled, off meds.  

## 2016-10-04 DIAGNOSIS — R630 Anorexia: Secondary | ICD-10-CM | POA: Diagnosis not present

## 2016-10-04 DIAGNOSIS — R634 Abnormal weight loss: Secondary | ICD-10-CM | POA: Diagnosis not present

## 2016-10-04 DIAGNOSIS — E46 Unspecified protein-calorie malnutrition: Secondary | ICD-10-CM | POA: Diagnosis not present

## 2016-10-04 DIAGNOSIS — I4891 Unspecified atrial fibrillation: Secondary | ICD-10-CM | POA: Diagnosis not present

## 2016-10-04 DIAGNOSIS — I495 Sick sinus syndrome: Secondary | ICD-10-CM | POA: Diagnosis not present

## 2016-10-04 DIAGNOSIS — F0151 Vascular dementia with behavioral disturbance: Secondary | ICD-10-CM | POA: Diagnosis not present

## 2016-10-10 DIAGNOSIS — F0151 Vascular dementia with behavioral disturbance: Secondary | ICD-10-CM | POA: Diagnosis not present

## 2016-10-10 DIAGNOSIS — R630 Anorexia: Secondary | ICD-10-CM | POA: Diagnosis not present

## 2016-10-10 DIAGNOSIS — E46 Unspecified protein-calorie malnutrition: Secondary | ICD-10-CM | POA: Diagnosis not present

## 2016-10-10 DIAGNOSIS — I4891 Unspecified atrial fibrillation: Secondary | ICD-10-CM | POA: Diagnosis not present

## 2016-10-10 DIAGNOSIS — R634 Abnormal weight loss: Secondary | ICD-10-CM | POA: Diagnosis not present

## 2016-10-10 DIAGNOSIS — I495 Sick sinus syndrome: Secondary | ICD-10-CM | POA: Diagnosis not present

## 2016-10-17 DIAGNOSIS — R630 Anorexia: Secondary | ICD-10-CM | POA: Diagnosis not present

## 2016-10-17 DIAGNOSIS — I495 Sick sinus syndrome: Secondary | ICD-10-CM | POA: Diagnosis not present

## 2016-10-17 DIAGNOSIS — I4891 Unspecified atrial fibrillation: Secondary | ICD-10-CM | POA: Diagnosis not present

## 2016-10-17 DIAGNOSIS — F0151 Vascular dementia with behavioral disturbance: Secondary | ICD-10-CM | POA: Diagnosis not present

## 2016-10-17 DIAGNOSIS — R634 Abnormal weight loss: Secondary | ICD-10-CM | POA: Diagnosis not present

## 2016-10-17 DIAGNOSIS — E46 Unspecified protein-calorie malnutrition: Secondary | ICD-10-CM | POA: Diagnosis not present

## 2016-10-18 DIAGNOSIS — E46 Unspecified protein-calorie malnutrition: Secondary | ICD-10-CM | POA: Diagnosis not present

## 2016-10-18 DIAGNOSIS — I4891 Unspecified atrial fibrillation: Secondary | ICD-10-CM | POA: Diagnosis not present

## 2016-10-18 DIAGNOSIS — F0151 Vascular dementia with behavioral disturbance: Secondary | ICD-10-CM | POA: Diagnosis not present

## 2016-10-18 DIAGNOSIS — R634 Abnormal weight loss: Secondary | ICD-10-CM | POA: Diagnosis not present

## 2016-10-18 DIAGNOSIS — I495 Sick sinus syndrome: Secondary | ICD-10-CM | POA: Diagnosis not present

## 2016-10-18 DIAGNOSIS — R630 Anorexia: Secondary | ICD-10-CM | POA: Diagnosis not present

## 2016-10-24 DIAGNOSIS — R630 Anorexia: Secondary | ICD-10-CM | POA: Diagnosis not present

## 2016-10-24 DIAGNOSIS — I495 Sick sinus syndrome: Secondary | ICD-10-CM | POA: Diagnosis not present

## 2016-10-24 DIAGNOSIS — R634 Abnormal weight loss: Secondary | ICD-10-CM | POA: Diagnosis not present

## 2016-10-24 DIAGNOSIS — F0151 Vascular dementia with behavioral disturbance: Secondary | ICD-10-CM | POA: Diagnosis not present

## 2016-10-24 DIAGNOSIS — I4891 Unspecified atrial fibrillation: Secondary | ICD-10-CM | POA: Diagnosis not present

## 2016-10-24 DIAGNOSIS — E46 Unspecified protein-calorie malnutrition: Secondary | ICD-10-CM | POA: Diagnosis not present

## 2016-10-25 DIAGNOSIS — I495 Sick sinus syndrome: Secondary | ICD-10-CM | POA: Diagnosis not present

## 2016-10-25 DIAGNOSIS — I4891 Unspecified atrial fibrillation: Secondary | ICD-10-CM | POA: Diagnosis not present

## 2016-10-25 DIAGNOSIS — R634 Abnormal weight loss: Secondary | ICD-10-CM | POA: Diagnosis not present

## 2016-10-25 DIAGNOSIS — F0151 Vascular dementia with behavioral disturbance: Secondary | ICD-10-CM | POA: Diagnosis not present

## 2016-10-25 DIAGNOSIS — E46 Unspecified protein-calorie malnutrition: Secondary | ICD-10-CM | POA: Diagnosis not present

## 2016-10-25 DIAGNOSIS — R630 Anorexia: Secondary | ICD-10-CM | POA: Diagnosis not present

## 2016-10-26 DIAGNOSIS — I495 Sick sinus syndrome: Secondary | ICD-10-CM | POA: Diagnosis not present

## 2016-10-26 DIAGNOSIS — I4891 Unspecified atrial fibrillation: Secondary | ICD-10-CM | POA: Diagnosis not present

## 2016-10-26 DIAGNOSIS — R630 Anorexia: Secondary | ICD-10-CM | POA: Diagnosis not present

## 2016-10-26 DIAGNOSIS — R634 Abnormal weight loss: Secondary | ICD-10-CM | POA: Diagnosis not present

## 2016-10-26 DIAGNOSIS — E46 Unspecified protein-calorie malnutrition: Secondary | ICD-10-CM | POA: Diagnosis not present

## 2016-10-26 DIAGNOSIS — F0151 Vascular dementia with behavioral disturbance: Secondary | ICD-10-CM | POA: Diagnosis not present

## 2016-10-27 DIAGNOSIS — R630 Anorexia: Secondary | ICD-10-CM | POA: Diagnosis not present

## 2016-10-27 DIAGNOSIS — I4891 Unspecified atrial fibrillation: Secondary | ICD-10-CM | POA: Diagnosis not present

## 2016-10-27 DIAGNOSIS — R634 Abnormal weight loss: Secondary | ICD-10-CM | POA: Diagnosis not present

## 2016-10-27 DIAGNOSIS — F0151 Vascular dementia with behavioral disturbance: Secondary | ICD-10-CM | POA: Diagnosis not present

## 2016-10-27 DIAGNOSIS — I495 Sick sinus syndrome: Secondary | ICD-10-CM | POA: Diagnosis not present

## 2016-10-27 DIAGNOSIS — E46 Unspecified protein-calorie malnutrition: Secondary | ICD-10-CM | POA: Diagnosis not present

## 2016-10-30 DIAGNOSIS — E785 Hyperlipidemia, unspecified: Secondary | ICD-10-CM | POA: Diagnosis not present

## 2016-10-30 DIAGNOSIS — I4891 Unspecified atrial fibrillation: Secondary | ICD-10-CM | POA: Diagnosis not present

## 2016-10-30 DIAGNOSIS — E039 Hypothyroidism, unspecified: Secondary | ICD-10-CM | POA: Diagnosis not present

## 2016-10-30 DIAGNOSIS — H3530 Unspecified macular degeneration: Secondary | ICD-10-CM | POA: Diagnosis not present

## 2016-10-30 DIAGNOSIS — E46 Unspecified protein-calorie malnutrition: Secondary | ICD-10-CM | POA: Diagnosis not present

## 2016-10-30 DIAGNOSIS — I10 Essential (primary) hypertension: Secondary | ICD-10-CM | POA: Diagnosis not present

## 2016-10-30 DIAGNOSIS — M84359S Stress fracture, hip, unspecified, sequela: Secondary | ICD-10-CM | POA: Diagnosis not present

## 2016-10-30 DIAGNOSIS — I495 Sick sinus syndrome: Secondary | ICD-10-CM | POA: Diagnosis not present

## 2016-10-30 DIAGNOSIS — R630 Anorexia: Secondary | ICD-10-CM | POA: Diagnosis not present

## 2016-10-30 DIAGNOSIS — E1159 Type 2 diabetes mellitus with other circulatory complications: Secondary | ICD-10-CM | POA: Diagnosis not present

## 2016-10-30 DIAGNOSIS — F339 Major depressive disorder, recurrent, unspecified: Secondary | ICD-10-CM | POA: Diagnosis not present

## 2016-10-30 DIAGNOSIS — F0151 Vascular dementia with behavioral disturbance: Secondary | ICD-10-CM | POA: Diagnosis not present

## 2016-10-30 DIAGNOSIS — R634 Abnormal weight loss: Secondary | ICD-10-CM | POA: Diagnosis not present

## 2016-11-01 DIAGNOSIS — F0151 Vascular dementia with behavioral disturbance: Secondary | ICD-10-CM | POA: Diagnosis not present

## 2016-11-01 DIAGNOSIS — R630 Anorexia: Secondary | ICD-10-CM | POA: Diagnosis not present

## 2016-11-01 DIAGNOSIS — I495 Sick sinus syndrome: Secondary | ICD-10-CM | POA: Diagnosis not present

## 2016-11-01 DIAGNOSIS — E46 Unspecified protein-calorie malnutrition: Secondary | ICD-10-CM | POA: Diagnosis not present

## 2016-11-01 DIAGNOSIS — I4891 Unspecified atrial fibrillation: Secondary | ICD-10-CM | POA: Diagnosis not present

## 2016-11-01 DIAGNOSIS — R634 Abnormal weight loss: Secondary | ICD-10-CM | POA: Diagnosis not present

## 2016-11-02 ENCOUNTER — Encounter: Payer: Self-pay | Admitting: Nurse Practitioner

## 2016-11-02 ENCOUNTER — Non-Acute Institutional Stay (SKILLED_NURSING_FACILITY): Payer: Medicare Other | Admitting: Nurse Practitioner

## 2016-11-02 DIAGNOSIS — E113419 Type 2 diabetes mellitus with severe nonproliferative diabetic retinopathy with macular edema, unspecified eye: Secondary | ICD-10-CM | POA: Diagnosis not present

## 2016-11-02 DIAGNOSIS — F0151 Vascular dementia with behavioral disturbance: Secondary | ICD-10-CM | POA: Diagnosis not present

## 2016-11-02 DIAGNOSIS — F02818 Dementia in other diseases classified elsewhere, unspecified severity, with other behavioral disturbance: Secondary | ICD-10-CM

## 2016-11-02 DIAGNOSIS — G894 Chronic pain syndrome: Secondary | ICD-10-CM | POA: Diagnosis not present

## 2016-11-02 DIAGNOSIS — I495 Sick sinus syndrome: Secondary | ICD-10-CM | POA: Diagnosis not present

## 2016-11-02 DIAGNOSIS — H547 Unspecified visual loss: Secondary | ICD-10-CM | POA: Diagnosis not present

## 2016-11-02 DIAGNOSIS — E46 Unspecified protein-calorie malnutrition: Secondary | ICD-10-CM | POA: Diagnosis not present

## 2016-11-02 DIAGNOSIS — F325 Major depressive disorder, single episode, in full remission: Secondary | ICD-10-CM | POA: Diagnosis not present

## 2016-11-02 DIAGNOSIS — K5900 Constipation, unspecified: Secondary | ICD-10-CM

## 2016-11-02 DIAGNOSIS — E032 Hypothyroidism due to medicaments and other exogenous substances: Secondary | ICD-10-CM

## 2016-11-02 DIAGNOSIS — R630 Anorexia: Secondary | ICD-10-CM | POA: Diagnosis not present

## 2016-11-02 DIAGNOSIS — R627 Adult failure to thrive: Secondary | ICD-10-CM

## 2016-11-02 DIAGNOSIS — F0281 Dementia in other diseases classified elsewhere with behavioral disturbance: Secondary | ICD-10-CM

## 2016-11-02 DIAGNOSIS — R634 Abnormal weight loss: Secondary | ICD-10-CM | POA: Diagnosis not present

## 2016-11-02 DIAGNOSIS — I4891 Unspecified atrial fibrillation: Secondary | ICD-10-CM | POA: Diagnosis not present

## 2016-11-02 DIAGNOSIS — I10 Essential (primary) hypertension: Secondary | ICD-10-CM

## 2016-11-02 DIAGNOSIS — I481 Persistent atrial fibrillation: Secondary | ICD-10-CM

## 2016-11-02 DIAGNOSIS — I4819 Other persistent atrial fibrillation: Secondary | ICD-10-CM

## 2016-11-02 NOTE — Assessment & Plan Note (Signed)
Persists, supportive care, prn Morphine, Ativan, Tylenol available to her.  

## 2016-11-02 NOTE — Assessment & Plan Note (Signed)
Stable, continue Senokot S II qhs, prn Dulcolax suppository.

## 2016-11-02 NOTE — Assessment & Plan Note (Signed)
Controlled, off meds.  

## 2016-11-02 NOTE — Assessment & Plan Note (Signed)
Mood is managed on  Lexapro 10mg daily 

## 2016-11-02 NOTE — Assessment & Plan Note (Signed)
Multiple sites, prn Morphine and Tylenol available to her to manage pain.

## 2016-11-02 NOTE — Progress Notes (Signed)
Location:  [facility] Room Number: 30 Place of Service:  SNF (31) Provider:  Jvion Turgeon, Manxie  NP  Murray Hodgkins, MD  Patient Care Team: Kimber Relic, MD as PCP - General (Internal Medicine) Adelina Collard Johnney Ou, NP as Nurse Practitioner (Nurse Practitioner) [facility] Harle Stanford., MD as Consulting Physician (Urology) Rachael Fee, MD as Consulting Physician (Gastroenterology) Salvatore Marvel, MD as Consulting Physician (Orthopedic Surgery) Venancio Poisson, MD as Consulting Physician (Dermatology) Sheral Apley, MD as Attending Physician (Orthopedic Surgery)  Extended Emergency Contact Information Primary Emergency Contact: Cook,[name] Address: [address]          [place], [place] [address] [place] Home Phone: [phone] Work Phone: [phone] Mobile Phone: [phone] Relation: Son Secondary Emergency Contact: Manship,[name] Address: [address]          [place], [place] [address] [place] Home Phone: [phone] Relation: Relative  Code Status:  DNR Goals of care: Advanced Directive information Advanced Directives 11/02/2016  Does Patient Have a Medical Advance Directive? Yes  Type of Estate agent of [place];Living will;Out of facility DNR (pink MOST or yellow form)  Does patient want to make changes to medical advance directive? No - Patient declined  Copy of Healthcare Power of Attorney in Chart? Yes  Pre-existing out of facility DNR order (yellow form or pink MOST form) Yellow form placed in chart (order not valid for inpatient use)     Chief Complaint  Patient presents with  . Medical Management of Chronic Issues    HPI:  Pt is a 83 y.o. female seen today for medical management of chronic diseases.      Hx of hypothyroidism, last TSH wnl 12/2014 off Levothyroxine 12.51mcg. Her blood sugar is controlled on diet, last Hgb A1c 5.8 12/2014. Blood pressure and heart rate are controlled  offmetoprolol. Her mood is managed on Lexapro  , dementia requires SNF for care needs, her urinary incontinent is chronic and managed with adult depends. She has BM daily. She is still able to feed self at meals. FTT persisted. Healed left superior pubic ramus fx at the acetabulum hairline extension into the hip joint. Her goal of care is comfort measures, prn Morphine, Lorazepam  4pm and q2h prn, Tylenol available to her.   Past Medical History:  Diagnosis Date  . Abnormality of gait 09/08/2003  . AF (atrial fibrillation) (HCC)   . Anxiety state, unspecified 08/19/2010  . Aortic stenosis   . Atrial fibrillation (HCC) 07/14/2009   Qualifier: Diagnosis of  By: [name], MD, Ruthann Cancer Ty Hilts   . Bradycardia   . Cardiac pacemaker in situ 09/12/2004  . Closed fracture of lumbar vertebra without mention of spinal cord injury [id number]  . Closed fracture of unspecified trochanteric section of femur 05/29/2011  . Constipation 03/14/2014  . Dementia 2013   04/03/14 MMSE 22/30   . Depression 04/10/2014  . Diffuse cystic mastopathy 12/23/2009  . Disturbance of salivary secretion [id number]  . Edema 08/18/2003  . Fall 12/19/2012   07/22/14 fall VS 144/76, 60, 20, 97.1. No apparent injury   . Flatulence, eructation, and gas pain 12/03/2009  . Fracture of greater trochanter of left femur (HCC) 03/08/14  . FTT (failure to thrive) in adult 12/15/2014  . Hearing loss   . Hemorrhoids   . Hip fracture (HCC)   . HTN (hypertension)   . Hypothyroidism 12/19/2007   Qualifier: Diagnosis of  By: Melvyn Neth CMA (AAMA), Patty  01/16/14 TSH 2.474 03/24/14 TSH 2.576    . 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 Loss of weight 10/13/2014   Multiple factorials: dementia, depression, advanced age, possible AR of medications(metformin and Oxybutynin)-will increase Mirtazapine and continue supplement. Observe.    . Macular degeneration (senile) of retina, unspecified 12/12/2001  .  Macular degeneration of both eyes 01/24/2013   Severe visual impairment. Nearly blind.   . Mitral valve prolapse   . Occlusion and stenosis of carotid artery without mention of cerebral infarction 12/27/2007  . Other and unspecified hyperlipidemia   . Other malaise and fatigue 12/06/2007  . Pacemaker-dual  medtronic 09/07/2011  . Personal history of fall 01/29/2009  . Rectal bleeding   . Senile osteoporosis 12/13/2000  . Sinus node dysfunction (HCC)   . Syncope and collapse 12/27/2007  . Trochanteric fracture of left femur (HCC) 03/14/2014   CT scan did however reveal acute fracture in greater left trochanteric. Dr. Margarita Rana consulted. Recommended nonoperative management and weightbearing as tolerated and another 3 weeks of Lovenox for DVT prophylaxis.       . Type 2 diabetes mellitus with blindness, with macular edema, with severe nonproliferative retinopathy 12/19/2007   Qualifier: Diagnosis of  By: Melvyn Neth CMA (AAMA), Patty  01/09/14 Hgb A1c 6.5 01/16/14 Hgb A1c 6.5 03/24/14 Hgb 6.4 12/10/14 pharm: dc Metformin and CBG monitoring-failure to thrive and refusing to eat.     Marland Kitchen Unspecified hereditary and idiopathic peripheral neuropathy 10/28/202012  . Unspecified hypothyroidism 03/18/2010  . Unspecified urinary incontinence 02/22/2005  . Urinary tract infection, site not specified 04/10/2014  . Xerophthalmus 01/21/2015   Past Surgical History:  Procedure Laterality Date  . BREAST BIOPSY Left 1970   benign  . Left hip surgery  09/2005  . PACEMAKER INSERTION  2006   Medtronic In-Pulse  . Right hip surgery  08/2003  . TONSILLECTOMY    . 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 Codeine     unknown  . Ibuprofen     unknown  . Lisinopril     unknown  . Penicillins     unknown  . Sanctura [Trospium Chloride]     unknown    Allergies as of 11/02/2016      Reactions   Codeine    unknown    Ibuprofen    unknown   Lisinopril    unknown   Penicillins    unknown   Sanctura [trospium Chloride]    unknown      Medication List       Accurate as of 11/02/16  1:39 PM. Always use your most recent med list.          atropine 1 % ophthalmic solution Place 4 drops on tongue  as needed for excess secretions   bisacodyl 10 MG suppository Commonly known as:  DULCOLAX Place 10 mg rectally as needed for moderate constipation.   escitalopram 10 MG tablet Commonly known as:  LEXAPRO Take 10 mg by mouth daily.   LORazepam 1 MG tablet Commonly known as:  ATIVAN Take 1 mg by mouth. Take one every evening at 4 pm. Take one tablet every 2 hours as needed for anxiety .   morphine 20 MG/ML concentrated solution Commonly known as:  ROXANOL Give 0.94ml ( ) by mouth every two hours as needed   sennosides-docusate sodium 8.6-50 MG tablet Commonly  known as:  SENOKOT-S Take 2 tablets by mouth at bedtime. Reported on 12/11/2015   TYLENOL 325 MG tablet Generic drug:  acetaminophen Take 650 mg by mouth every 4 (four) hours as needed for mild pain or moderate pain.       Review of Systems  Constitutional: Negative for chills and fever.       Emaciated  HENT: Positive for hearing loss. Negative for congestion, ear discharge and ear pain.   Eyes: Negative for pain, discharge and redness.       Low vision Dry eyes  Respiratory: Negative for cough, shortness of breath, wheezing and stridor.        Occasional hacking cough, non productive, no O2 desat, afebrile.  Cardiovascular: Negative for chest pain, palpitations and leg swelling.  Gastrointestinal: Negative for abdominal pain, constipation, nausea and vomiting.  Genitourinary: Positive for frequency. Negative for dysuria and urgency.  Musculoskeletal: Positive for arthralgias and gait problem. Negative for back pain, myalgias and neck pain.       Left hip/leg pain.   Skin: Negative for rash.  Neurological: Negative for  dizziness, tremors, seizures, weakness and headaches.       Severre dementia  Hematological: Negative.   Psychiatric/Behavioral: Positive for confusion and decreased concentration. Negative for hallucinations and suicidal ideas. The patient is nervous/anxious.     Immunization History  Administered Date(s) Administered  . Influenza-Unspecified 06/06/2013, 04/21/2015, 05/17/2016  . PPD Test 07/23/2011  . Pneumococcal Polysaccharide-23 03/14/2009  . Td 03/14/2009  . Zoster 06/29/2006   Pertinent  Health Maintenance Due  Topic Date Due  . OPHTHALMOLOGY EXAM  07/28/1927  . URINE MICROALBUMIN  07/28/1927  . DEXA SCAN  07/27/1982  . PNA vac Low Risk Adult (2 of 2 - PCV13) 03/14/2010  . HEMOGLOBIN A1C  07/29/2015  . FOOT EXAM  01/26/2016  . INFLUENZA VACCINE  03/01/2017   Fall Risk  01/26/2015 03/06/2014  Falls in the past year? Yes Yes  Number falls in past yr: 1 1  Injury with Fall? Yes Yes  Risk Factor Category  - High Fall Risk  Risk for fall due to : History of fall(s);Impaired balance/gait;Impaired mobility -  Follow up Falls evaluation completed -   Functional Status Survey:    Vitals:   11/02/16 1001  BP: 122/68  Pulse: 70  Resp: 18  Temp: 98 F (36.7 C)  Weight: 93 lb 8 oz (42.4 kg)  Height:  (1.575 m)   Body mass index is 17.1 kg/m. Physical Exam  Constitutional:  Extremely thin  HENT:  Head: Normocephalic and atraumatic.  Right Ear: External ear normal.  Left Ear: External ear normal.  Nose: Nose normal.  Mouth/Throat: Oropharynx is clear and moist. No oropharyngeal exudate.  Eyes: Conjunctivae and EOM are normal. Pupils are equal, round, and reactive to light. Right eye exhibits no discharge. Left eye exhibits no discharge. No scleral icterus.  Dry eyes. Legally blind.  Neck: Normal range of motion. Neck supple. No JVD present. No thyromegaly present.  Cardiovascular: Normal rate.   Murmur heard. Pacemaker. Systolic ejection murmur 3/6    Pulmonary/Chest: Effort normal and breath sounds normal. No respiratory distress. She has no wheezes. She has no rales. She exhibits no tenderness.  Abdominal: Soft. Bowel sounds are normal. She exhibits no distension. There is no tenderness. There is no rebound.  Musculoskeletal: Normal range of motion. She exhibits tenderness. She exhibits no edema.  Left hip and leg pain  Lymphadenopathy:    She has no cervical  adenopathy.  Neurological: She is alert. She has normal reflexes. No cranial nerve deficit. She exhibits normal muscle tone. Coordination normal.  Skin: Skin is warm and dry. No rash noted. She is not diaphoretic. No erythema. No pallor.      Psychiatric: Thought content normal. Her mood appears not anxious. Her affect is not angry, not blunt, not labile and not inappropriate. Her speech is not rapid and/or pressured, not delayed and not slurred. She is slowed. She is not agitated, not aggressive, not hyperactive, not withdrawn, not actively hallucinating and not combative. Thought content is not paranoid and not delusional. Cognition and memory are impaired. She expresses impulsivity and inappropriate judgment. She exhibits a depressed mood. She exhibits abnormal recent memory.  Confusion, HOH, very low vision. Guards against examination. She is attentive.    Labs reviewed: No results for input(s): NA, K, CL, CO2, GLUCOSE, BUN, CREATININE, CALCIUM, MG, PHOS in the last 8760 hours. No results for input(s): AST, ALT, ALKPHOS, BILITOT, PROT, ALBUMIN in the last 8760 hours. No results for input(s): WBC, NEUTROABS, HGB, HCT, MCV, PLT in the last 8760 hours. Lab Results  Component Value Date   TSH 3.55 01/27/2015   Lab Results  Component Value Date   HGBA1C 5.8 01/27/2015   Lab Results  Component Value Date   CHOL 140 12/20/2012   HDL 38 12/20/2012   LDLCALC 57 12/20/2012   TRIG 227 (A) 12/20/2012   CHOLHDL 5.0 12/22/2007    Significant Diagnostic Results in last 30 days:   No results found.  Assessment/Plan Essential hypertension Controlled, off meds   ATRIAL FIBRILLATION Heart rate is in control, off meds  Constipation Stable, continue Senokot S II qhs, prn Dulcolax suppository.    Hypothyroidism Off Levothyroxine  Type 2 diabetes mellitus with blindness, severe nonproliferative retinopathy, and macular edema (HCC) Diet controlled, update Hgb a1c, urine microalbumin  Dementia Continue Lorazepam  4pm and q2h prn for comfort measures.   Depression Mood is managed on Lexapro  daily  FTT (failure to thrive) in adult Persists, supportive care, prn Morphine, Ativan, Tylenol available to her.  Chronic pain syndrome Multiple sites, prn Morphine and Tylenol available to her to manage pain.      Family/ staff Communication: SNF Hospice Service.   Labs/tests ordered:  Hgb a1c, urine microalbumin

## 2016-11-02 NOTE — Assessment & Plan Note (Signed)
Heart rate is in control, off meds.  

## 2016-11-02 NOTE — Assessment & Plan Note (Signed)
Off Levothyroxine

## 2016-11-02 NOTE — Assessment & Plan Note (Signed)
Continue Lorazepam  4pm and q2h prn for comfort measures.

## 2016-11-02 NOTE — Assessment & Plan Note (Signed)
Diet controlled, update Hgb a1c, urine microalbumin

## 2016-11-04 DIAGNOSIS — R630 Anorexia: Secondary | ICD-10-CM | POA: Diagnosis not present

## 2016-11-04 DIAGNOSIS — F0151 Vascular dementia with behavioral disturbance: Secondary | ICD-10-CM | POA: Diagnosis not present

## 2016-11-04 DIAGNOSIS — I4891 Unspecified atrial fibrillation: Secondary | ICD-10-CM | POA: Diagnosis not present

## 2016-11-04 DIAGNOSIS — R634 Abnormal weight loss: Secondary | ICD-10-CM | POA: Diagnosis not present

## 2016-11-04 DIAGNOSIS — I495 Sick sinus syndrome: Secondary | ICD-10-CM | POA: Diagnosis not present

## 2016-11-04 DIAGNOSIS — E46 Unspecified protein-calorie malnutrition: Secondary | ICD-10-CM | POA: Diagnosis not present

## 2016-11-07 DIAGNOSIS — E46 Unspecified protein-calorie malnutrition: Secondary | ICD-10-CM | POA: Diagnosis not present

## 2016-11-07 DIAGNOSIS — I4891 Unspecified atrial fibrillation: Secondary | ICD-10-CM | POA: Diagnosis not present

## 2016-11-07 DIAGNOSIS — F0151 Vascular dementia with behavioral disturbance: Secondary | ICD-10-CM | POA: Diagnosis not present

## 2016-11-07 DIAGNOSIS — R634 Abnormal weight loss: Secondary | ICD-10-CM | POA: Diagnosis not present

## 2016-11-07 DIAGNOSIS — I495 Sick sinus syndrome: Secondary | ICD-10-CM | POA: Diagnosis not present

## 2016-11-07 DIAGNOSIS — R630 Anorexia: Secondary | ICD-10-CM | POA: Diagnosis not present

## 2016-11-09 DIAGNOSIS — R630 Anorexia: Secondary | ICD-10-CM | POA: Diagnosis not present

## 2016-11-09 DIAGNOSIS — R634 Abnormal weight loss: Secondary | ICD-10-CM | POA: Diagnosis not present

## 2016-11-09 DIAGNOSIS — F0151 Vascular dementia with behavioral disturbance: Secondary | ICD-10-CM | POA: Diagnosis not present

## 2016-11-09 DIAGNOSIS — E46 Unspecified protein-calorie malnutrition: Secondary | ICD-10-CM | POA: Diagnosis not present

## 2016-11-09 DIAGNOSIS — I495 Sick sinus syndrome: Secondary | ICD-10-CM | POA: Diagnosis not present

## 2016-11-09 DIAGNOSIS — I4891 Unspecified atrial fibrillation: Secondary | ICD-10-CM | POA: Diagnosis not present

## 2016-11-10 ENCOUNTER — Non-Acute Institutional Stay (SKILLED_NURSING_FACILITY): Payer: Medicare Other | Admitting: Nurse Practitioner

## 2016-11-10 ENCOUNTER — Encounter: Payer: Self-pay | Admitting: Nurse Practitioner

## 2016-11-10 DIAGNOSIS — F329 Major depressive disorder, single episode, unspecified: Secondary | ICD-10-CM | POA: Diagnosis not present

## 2016-11-10 DIAGNOSIS — E113419 Type 2 diabetes mellitus with severe nonproliferative diabetic retinopathy with macular edema, unspecified eye: Secondary | ICD-10-CM | POA: Diagnosis not present

## 2016-11-10 DIAGNOSIS — K5900 Constipation, unspecified: Secondary | ICD-10-CM

## 2016-11-10 DIAGNOSIS — F0281 Dementia in other diseases classified elsewhere with behavioral disturbance: Secondary | ICD-10-CM | POA: Diagnosis not present

## 2016-11-10 DIAGNOSIS — H547 Unspecified visual loss: Secondary | ICD-10-CM

## 2016-11-10 DIAGNOSIS — R627 Adult failure to thrive: Secondary | ICD-10-CM

## 2016-11-10 DIAGNOSIS — I10 Essential (primary) hypertension: Secondary | ICD-10-CM

## 2016-11-10 DIAGNOSIS — F419 Anxiety disorder, unspecified: Secondary | ICD-10-CM

## 2016-11-10 DIAGNOSIS — I481 Persistent atrial fibrillation: Secondary | ICD-10-CM

## 2016-11-10 DIAGNOSIS — E032 Hypothyroidism due to medicaments and other exogenous substances: Secondary | ICD-10-CM | POA: Diagnosis not present

## 2016-11-10 DIAGNOSIS — I4819 Other persistent atrial fibrillation: Secondary | ICD-10-CM

## 2016-11-10 DIAGNOSIS — G894 Chronic pain syndrome: Secondary | ICD-10-CM

## 2016-11-10 DIAGNOSIS — F02818 Dementia in other diseases classified elsewhere, unspecified severity, with other behavioral disturbance: Secondary | ICD-10-CM

## 2016-11-10 NOTE — Assessment & Plan Note (Signed)
Continue Lorazepam 1mg 4pm and q2h prn for comfort measures.  

## 2016-11-10 NOTE — Assessment & Plan Note (Signed)
Continue Lexapro  daily and Lorazepam  bid/q2h prn to stabilize her mood, re-evaluate in 3 months, her goal of care is comfort measures.

## 2016-11-10 NOTE — Assessment & Plan Note (Signed)
Continue supportive measures 

## 2016-11-10 NOTE — Assessment & Plan Note (Signed)
Heart rate is in control, off meds.  

## 2016-11-10 NOTE — Assessment & Plan Note (Signed)
Multiple sites, prn Morphine and Tylenol available to her to manage pain.  

## 2016-11-10 NOTE — Progress Notes (Signed)
Location:  [facility] Room Number: 30 Place of Service:  SNF (31) Provider:  Emmaleah Meroney, Manxie  NP  Murray Hodgkins, MD  Patient Care Team: Kimber Relic, MD as PCP - General (Internal Medicine) Adael Culbreath Johnney Ou, NP as Nurse Practitioner (Nurse Practitioner) [facility] Harle Stanford., MD as Consulting Physician (Urology) Rachael Fee, MD as Consulting Physician (Gastroenterology) Salvatore Marvel, MD as Consulting Physician (Orthopedic Surgery) Venancio Poisson, MD as Consulting Physician (Dermatology) Sheral Apley, MD as Attending Physician (Orthopedic Surgery)  Extended Emergency Contact Information Primary Emergency Contact: Cook,[name] Address: [address]          [place], [place] [address] [place] Home Phone: [phone] Work Phone: [phone] Mobile Phone: [phone] Relation: Son Secondary Emergency Contact: Petko,[name] Address: [address]          [place], [place] [address] [place] Home Phone: [phone] Relation: Relative  Code Status:  DNR Goals of care: Advanced Directive information Advanced Directives 11/10/2016  Does Patient Have a Medical Advance Directive? Yes  Type of Estate agent of [place];Living will;Out of facility DNR (pink MOST or yellow form)  Does patient want to make changes to medical advance directive? No - Patient declined  Copy of Healthcare Power of Attorney in Chart? Yes  Pre-existing out of facility DNR order (yellow form or pink MOST form) Yellow form placed in chart (order not valid for inpatient use)     Chief Complaint  Patient presents with  . Acute Visit    medication mangement    HPI:  Pt is a 83 y.o. female seen today for an acute visit for anxiety, the patient has dementia, low vision, unable to voice her needs, she is total dependent of ADLs. The patient is anxious, guarded when she was examined, yelling when she was touched. Lorazepam   bid and q2h prn are needed for her mood and the goal of care.     Hx of hypothyroidism, last TSH wnl 12/2014 off Levothyroxine 12.97mcg. Her blood sugar is controlled on diet, last Hgb A1c 5.8 12/2014. Blood pressure and heart rate are controlled offmetoprolol. Her mood is managed on Lexapro  , dementia requires SNFfor care needs, her urinary incontinent is chronic and managed with adult depends. She has BM daily. She is still able to feed self at meals. FTT persisted. Healed left superior pubic ramus fx at the acetabulum hairline extension into the hip joint. Her goal of care is comfort measures, prn Morphine, Lorazepam  4pm and q2h prn, Tylenol available to her.   Past Medical History:  Diagnosis Date  . Abnormality of gait 09/08/2003  . AF (atrial fibrillation) (HCC)   . Anxiety state, unspecified 08/19/2010  . Aortic stenosis   . Atrial fibrillation (HCC) 07/14/2009   Qualifier: Diagnosis of  By: [name], MD, Ruthann Cancer Ty Hilts   . Bradycardia   . Cardiac pacemaker in situ 09/12/2004  . Closed fracture of lumbar vertebra without mention of spinal cord injury 108-06-2009  . Closed fracture of unspecified trochanteric section of femur 05/29/2011  . Constipation 03/14/2014  . Dementia 2013   04/03/14 MMSE 22/30   . Depression 04/10/2014  . Diffuse cystic mastopathy 12/23/2009  . Disturbance of salivary secretion [id number]  . Edema 08/18/2003  . Fall 12/19/2012   07/22/14 fall VS 144/76, 60, 20, 97.1. No apparent injury   . Flatulence, eructation, and gas pain 12/03/2009  . Fracture of greater trochanter of left femur (  HCC) 03/08/14  . FTT (failure to thrive) in adult 12/15/2014  . Hearing loss   . Hemorrhoids   . Hip fracture (HCC)   . HTN (hypertension)   . Hypothyroidism 12/19/2007   Qualifier: Diagnosis of  By: Melvyn Neth CMA (AAMA), Patty  01/16/14 TSH 2.474 03/24/14 TSH 2.576    . 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 Loss of weight 10/13/2014    Multiple factorials: dementia, depression, advanced age, possible AR of medications(metformin and Oxybutynin)-will increase Mirtazapine and continue supplement. Observe.    . Macular degeneration (senile) of retina, unspecified 12/12/2001  . Macular degeneration of both eyes 01/24/2013   Severe visual impairment. Nearly blind.   . Mitral valve prolapse   . Occlusion and stenosis of carotid artery without mention of cerebral infarction 12/27/2007  . Other and unspecified hyperlipidemia   . Other malaise and fatigue 12/06/2007  . Pacemaker-dual  medtronic 09/07/2011  . Personal history of fall 01/29/2009  . Rectal bleeding   . Senile osteoporosis 12/13/2000  . Sinus node dysfunction (HCC)   . Syncope and collapse 12/27/2007  . Trochanteric fracture of left femur (HCC) 03/14/2014   CT scan did however reveal acute fracture in greater left trochanteric. Dr. Margarita Rana consulted. Recommended nonoperative management and weightbearing as tolerated and another 3 weeks of Lovenox for DVT prophylaxis.       . Type 2 diabetes mellitus with blindness, with macular edema, with severe nonproliferative retinopathy 12/19/2007   Qualifier: Diagnosis of  By: Melvyn Neth CMA (AAMA), Patty  01/09/14 Hgb A1c 6.5 01/16/14 Hgb A1c 6.5 03/24/14 Hgb 6.4 12/10/14 pharm: dc Metformin and CBG monitoring-failure to thrive and refusing to eat.     Marland Kitchen Unspecified hereditary and idiopathic peripheral neuropathy 10/28/202012  . Unspecified hypothyroidism 03/18/2010  . Unspecified urinary incontinence 02/22/2005  . Urinary tract infection, site not specified 04/10/2014  . Xerophthalmus 01/21/2015   Past Surgical History:  Procedure Laterality Date  . BREAST BIOPSY Left 1970   benign  . Left hip surgery  09/2005  . PACEMAKER INSERTION  2006   Medtronic In-Pulse  . Right hip surgery  08/2003  . TONSILLECTOMY    . 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 Codeine     unknown  . Ibuprofen     unknown  . Lisinopril     unknown  . Penicillins     unknown  . [place] [Trospium Chloride]     unknown    Outpatient Encounter Prescriptions as of 11/10/2016  Medication Sig  . acetaminophen (TYLENOL) 325 MG tablet Take 650 mg by mouth every 4 (four) hours as needed for mild pain or moderate pain.   Marland Kitchen atropine 1 % ophthalmic solution Place 4 drops on tongue  as needed for excess secretions  . bisacodyl (DULCOLAX) 10 MG suppository Place 10 mg rectally as needed for moderate constipation.  Marland Kitchen escitalopram (LEXAPRO) 10 MG tablet Take 10 mg by mouth daily.  Marland Kitchen LORazepam (ATIVAN) 1 MG tablet Take 1 mg by mouth. Take one every evening at 4 pm. Take one tablet every 2 hours as needed for anxiety .  . morphine (ROXANOL) 20 MG/ML concentrated solution Give 0.71ml ( ) by mouth every two hours as needed  . sennosides-docusate sodium (SENOKOT-S) 8.6-50 MG tablet Take 2 tablets by mouth at bedtime. Reported on 12/11/2015   No facility-administered encounter medications  on file as of 11/10/2016.     Review of Systems  Constitutional: Negative for chills and fever.       Emaciated  HENT: Positive for hearing loss. Negative for congestion, ear discharge and ear pain.   Eyes: Negative for pain, discharge and redness.       Low vision Dry eyes  Respiratory: Negative for cough, shortness of breath, wheezing and stridor.        Occasional hacking cough, non productive, no O2 desat, afebrile.  Cardiovascular: Negative for chest pain, palpitations and leg swelling.  Gastrointestinal: Negative for abdominal pain, constipation, nausea and vomiting.  Genitourinary: Positive for frequency. Negative for dysuria and urgency.  Musculoskeletal: Positive for arthralgias and gait problem. Negative for back pain, myalgias and neck pain.       Left hip/leg pain.   Skin: Negative for rash.  Neurological: Negative for dizziness, tremors, seizures, weakness and  headaches.       Severre dementia  Hematological: Negative.   Psychiatric/Behavioral: Positive for agitation, confusion and decreased concentration. Negative for hallucinations and suicidal ideas. The patient is nervous/anxious.     Immunization History  Administered Date(s) Administered  . Influenza-Unspecified 06/06/2013, 04/21/2015, 05/17/2016  . PPD Test 07/23/2011  . Pneumococcal Polysaccharide-23 03/14/2009  . Td 03/14/2009  . Zoster 06/29/2006   Pertinent  Health Maintenance Due  Topic Date Due  . OPHTHALMOLOGY EXAM  07/28/1927  . URINE MICROALBUMIN  07/28/1927  . DEXA SCAN  07/27/1982  . PNA vac Low Risk Adult (2 of 2 - PCV13) 03/14/2010  . HEMOGLOBIN A1C  07/29/2015  . FOOT EXAM  01/26/2016  . INFLUENZA VACCINE  03/01/2017   Fall Risk  01/26/2015 03/06/2014  Falls in the past year? Yes Yes  Number falls in past yr: 1 1  Injury with Fall? Yes Yes  Risk Factor Category  - High Fall Risk  Risk for fall due to : History of fall(s);Impaired balance/gait;Impaired mobility -  Follow up Falls evaluation completed -   Functional Status Survey:    Vitals:   11/10/16 1402  BP: 112/60  Pulse: 70  Resp: 18  Temp: 98 F (36.7 C)  Weight: 93 lb 8 oz (42.4 kg)  Height:  (1.575 m)   Body mass index is 17.1 kg/m. Physical Exam  Constitutional:  Extremely thin  HENT:  Head: Normocephalic and atraumatic.  Right Ear: External ear normal.  Left Ear: External ear normal.  Nose: Nose normal.  Mouth/Throat: Oropharynx is clear and moist. No oropharyngeal exudate.  Eyes: Conjunctivae and EOM are normal. Pupils are equal, round, and reactive to light. Right eye exhibits no discharge. Left eye exhibits no discharge. No scleral icterus.  Dry eyes. Legally blind.  Neck: Normal range of motion. Neck supple. No JVD present. No thyromegaly present.  Cardiovascular: Normal rate.   Murmur heard. Pacemaker. Systolic ejection murmur 3/6  Pulmonary/Chest: Effort normal and breath  sounds normal. No respiratory distress. She has no wheezes. She has no rales. She exhibits no tenderness.  Abdominal: Soft. Bowel sounds are normal. She exhibits no distension. There is no tenderness. There is no rebound.  Musculoskeletal: Normal range of motion. She exhibits tenderness. She exhibits no edema.  Left hip and leg pain  Lymphadenopathy:    She has no cervical adenopathy.  Neurological: She is alert. She has normal reflexes. No cranial nerve deficit. She exhibits normal muscle tone. Coordination normal.  Skin: Skin is warm and dry. No rash noted. She is not diaphoretic. No erythema. No  pallor.      Psychiatric: Thought content normal. Her mood appears not anxious. Her affect is not angry, not blunt, not labile and not inappropriate. Her speech is not rapid and/or pressured, not delayed and not slurred. She is slowed. She is not agitated, not aggressive, not hyperactive, not withdrawn, not actively hallucinating and not combative. Thought content is not paranoid and not delusional. Cognition and memory are impaired. She expresses impulsivity and inappropriate judgment. She exhibits a depressed mood. She exhibits abnormal recent memory.  Confusion, HOH, very low vision. Guards against examination. Anxiety  She is attentive.    Labs reviewed: No results for input(s): NA, K, CL, CO2, GLUCOSE, BUN, CREATININE, CALCIUM, MG, PHOS in the last 8760 hours. No results for input(s): AST, ALT, ALKPHOS, BILITOT, PROT, ALBUMIN in the last 8760 hours. No results for input(s): WBC, NEUTROABS, HGB, HCT, MCV, PLT in the last 8760 hours. Lab Results  Component Value Date   TSH 3.55 01/27/2015   Lab Results  Component Value Date   HGBA1C 5.8 01/27/2015   Lab Results  Component Value Date   CHOL 140 12/20/2012   HDL 38 12/20/2012   LDLCALC 57 12/20/2012   TRIG 227 (A) 12/20/2012   CHOLHDL 5.0 12/22/2007    Significant Diagnostic Results in last 30 days:  No results  found.  Assessment/Plan Anxiety and depression Continue Lexapro  daily and Lorazepam  bid/q2h prn to stabilize her mood, re-evaluate in 3 months, her goal of care is comfort measures.   Chronic pain syndrome Multiple sites, prn Morphine and Tylenol available to her to manage pain.   Essential hypertension Controlled, off meds   ATRIAL FIBRILLATION Heart rate is in control, off meds  Constipation Stable, continue Senokot S II qhs, prn Dulcolax suppository.    Hypothyroidism Off Levothyroxine  Type 2 diabetes mellitus with blindness, severe nonproliferative retinopathy, and macular edema (HCC) Her goal of care is comfort measures, continue Morphine, Ativan, Hospice Service.   Dementia Continue Lorazepam  4pm and q2h prn for comfort measures.   FTT (failure to thrive) in adult Continue supportive measures.      Family/ staff Communication: SNF Hospice Service  Labs/tests ordered: none

## 2016-11-10 NOTE — Assessment & Plan Note (Signed)
Her goal of care is comfort measures, continue Morphine, Ativan, Hospice Service.

## 2016-11-10 NOTE — Assessment & Plan Note (Signed)
Off Levothyroxine 

## 2016-11-10 NOTE — Assessment & Plan Note (Signed)
Controlled, off meds.  

## 2016-11-10 NOTE — Assessment & Plan Note (Signed)
Stable, continue Senokot S II qhs, prn Dulcolax suppository.   

## 2016-11-11 DIAGNOSIS — I495 Sick sinus syndrome: Secondary | ICD-10-CM | POA: Diagnosis not present

## 2016-11-11 DIAGNOSIS — E46 Unspecified protein-calorie malnutrition: Secondary | ICD-10-CM | POA: Diagnosis not present

## 2016-11-11 DIAGNOSIS — I4891 Unspecified atrial fibrillation: Secondary | ICD-10-CM | POA: Diagnosis not present

## 2016-11-11 DIAGNOSIS — R634 Abnormal weight loss: Secondary | ICD-10-CM | POA: Diagnosis not present

## 2016-11-11 DIAGNOSIS — F0151 Vascular dementia with behavioral disturbance: Secondary | ICD-10-CM | POA: Diagnosis not present

## 2016-11-11 DIAGNOSIS — R630 Anorexia: Secondary | ICD-10-CM | POA: Diagnosis not present

## 2016-11-14 DIAGNOSIS — E46 Unspecified protein-calorie malnutrition: Secondary | ICD-10-CM | POA: Diagnosis not present

## 2016-11-14 DIAGNOSIS — I4891 Unspecified atrial fibrillation: Secondary | ICD-10-CM | POA: Diagnosis not present

## 2016-11-14 DIAGNOSIS — R634 Abnormal weight loss: Secondary | ICD-10-CM | POA: Diagnosis not present

## 2016-11-14 DIAGNOSIS — F0151 Vascular dementia with behavioral disturbance: Secondary | ICD-10-CM | POA: Diagnosis not present

## 2016-11-14 DIAGNOSIS — R630 Anorexia: Secondary | ICD-10-CM | POA: Diagnosis not present

## 2016-11-14 DIAGNOSIS — I495 Sick sinus syndrome: Secondary | ICD-10-CM | POA: Diagnosis not present

## 2016-11-15 DIAGNOSIS — F0151 Vascular dementia with behavioral disturbance: Secondary | ICD-10-CM | POA: Diagnosis not present

## 2016-11-15 DIAGNOSIS — I495 Sick sinus syndrome: Secondary | ICD-10-CM | POA: Diagnosis not present

## 2016-11-15 DIAGNOSIS — R630 Anorexia: Secondary | ICD-10-CM | POA: Diagnosis not present

## 2016-11-15 DIAGNOSIS — R634 Abnormal weight loss: Secondary | ICD-10-CM | POA: Diagnosis not present

## 2016-11-15 DIAGNOSIS — I4891 Unspecified atrial fibrillation: Secondary | ICD-10-CM | POA: Diagnosis not present

## 2016-11-15 DIAGNOSIS — E46 Unspecified protein-calorie malnutrition: Secondary | ICD-10-CM | POA: Diagnosis not present

## 2016-11-16 DIAGNOSIS — I495 Sick sinus syndrome: Secondary | ICD-10-CM | POA: Diagnosis not present

## 2016-11-16 DIAGNOSIS — F0151 Vascular dementia with behavioral disturbance: Secondary | ICD-10-CM | POA: Diagnosis not present

## 2016-11-16 DIAGNOSIS — R630 Anorexia: Secondary | ICD-10-CM | POA: Diagnosis not present

## 2016-11-16 DIAGNOSIS — I4891 Unspecified atrial fibrillation: Secondary | ICD-10-CM | POA: Diagnosis not present

## 2016-11-16 DIAGNOSIS — R634 Abnormal weight loss: Secondary | ICD-10-CM | POA: Diagnosis not present

## 2016-11-16 DIAGNOSIS — E46 Unspecified protein-calorie malnutrition: Secondary | ICD-10-CM | POA: Diagnosis not present

## 2016-11-17 DIAGNOSIS — R630 Anorexia: Secondary | ICD-10-CM | POA: Diagnosis not present

## 2016-11-17 DIAGNOSIS — I495 Sick sinus syndrome: Secondary | ICD-10-CM | POA: Diagnosis not present

## 2016-11-17 DIAGNOSIS — R634 Abnormal weight loss: Secondary | ICD-10-CM | POA: Diagnosis not present

## 2016-11-17 DIAGNOSIS — I4891 Unspecified atrial fibrillation: Secondary | ICD-10-CM | POA: Diagnosis not present

## 2016-11-17 DIAGNOSIS — F0151 Vascular dementia with behavioral disturbance: Secondary | ICD-10-CM | POA: Diagnosis not present

## 2016-11-17 DIAGNOSIS — E46 Unspecified protein-calorie malnutrition: Secondary | ICD-10-CM | POA: Diagnosis not present

## 2016-11-18 DIAGNOSIS — R630 Anorexia: Secondary | ICD-10-CM | POA: Diagnosis not present

## 2016-11-18 DIAGNOSIS — E46 Unspecified protein-calorie malnutrition: Secondary | ICD-10-CM | POA: Diagnosis not present

## 2016-11-18 DIAGNOSIS — I495 Sick sinus syndrome: Secondary | ICD-10-CM | POA: Diagnosis not present

## 2016-11-18 DIAGNOSIS — F0151 Vascular dementia with behavioral disturbance: Secondary | ICD-10-CM | POA: Diagnosis not present

## 2016-11-18 DIAGNOSIS — R634 Abnormal weight loss: Secondary | ICD-10-CM | POA: Diagnosis not present

## 2016-11-18 DIAGNOSIS — I4891 Unspecified atrial fibrillation: Secondary | ICD-10-CM | POA: Diagnosis not present

## 2016-11-21 DIAGNOSIS — F0151 Vascular dementia with behavioral disturbance: Secondary | ICD-10-CM | POA: Diagnosis not present

## 2016-11-21 DIAGNOSIS — I4891 Unspecified atrial fibrillation: Secondary | ICD-10-CM | POA: Diagnosis not present

## 2016-11-21 DIAGNOSIS — E46 Unspecified protein-calorie malnutrition: Secondary | ICD-10-CM | POA: Diagnosis not present

## 2016-11-21 DIAGNOSIS — R630 Anorexia: Secondary | ICD-10-CM | POA: Diagnosis not present

## 2016-11-21 DIAGNOSIS — R634 Abnormal weight loss: Secondary | ICD-10-CM | POA: Diagnosis not present

## 2016-11-21 DIAGNOSIS — I495 Sick sinus syndrome: Secondary | ICD-10-CM | POA: Diagnosis not present

## 2016-11-22 DIAGNOSIS — I495 Sick sinus syndrome: Secondary | ICD-10-CM | POA: Diagnosis not present

## 2016-11-22 DIAGNOSIS — R634 Abnormal weight loss: Secondary | ICD-10-CM | POA: Diagnosis not present

## 2016-11-22 DIAGNOSIS — R630 Anorexia: Secondary | ICD-10-CM | POA: Diagnosis not present

## 2016-11-22 DIAGNOSIS — E46 Unspecified protein-calorie malnutrition: Secondary | ICD-10-CM | POA: Diagnosis not present

## 2016-11-22 DIAGNOSIS — F0151 Vascular dementia with behavioral disturbance: Secondary | ICD-10-CM | POA: Diagnosis not present

## 2016-11-22 DIAGNOSIS — I4891 Unspecified atrial fibrillation: Secondary | ICD-10-CM | POA: Diagnosis not present

## 2016-11-23 DIAGNOSIS — R634 Abnormal weight loss: Secondary | ICD-10-CM | POA: Diagnosis not present

## 2016-11-23 DIAGNOSIS — E46 Unspecified protein-calorie malnutrition: Secondary | ICD-10-CM | POA: Diagnosis not present

## 2016-11-23 DIAGNOSIS — R630 Anorexia: Secondary | ICD-10-CM | POA: Diagnosis not present

## 2016-11-23 DIAGNOSIS — F0151 Vascular dementia with behavioral disturbance: Secondary | ICD-10-CM | POA: Diagnosis not present

## 2016-11-23 DIAGNOSIS — I4891 Unspecified atrial fibrillation: Secondary | ICD-10-CM | POA: Diagnosis not present

## 2016-11-23 DIAGNOSIS — I495 Sick sinus syndrome: Secondary | ICD-10-CM | POA: Diagnosis not present

## 2016-11-24 DIAGNOSIS — F0151 Vascular dementia with behavioral disturbance: Secondary | ICD-10-CM | POA: Diagnosis not present

## 2016-11-24 DIAGNOSIS — I495 Sick sinus syndrome: Secondary | ICD-10-CM | POA: Diagnosis not present

## 2016-11-24 DIAGNOSIS — I4891 Unspecified atrial fibrillation: Secondary | ICD-10-CM | POA: Diagnosis not present

## 2016-11-24 DIAGNOSIS — R630 Anorexia: Secondary | ICD-10-CM | POA: Diagnosis not present

## 2016-11-24 DIAGNOSIS — E46 Unspecified protein-calorie malnutrition: Secondary | ICD-10-CM | POA: Diagnosis not present

## 2016-11-24 DIAGNOSIS — R634 Abnormal weight loss: Secondary | ICD-10-CM | POA: Diagnosis not present

## 2016-11-25 DIAGNOSIS — R634 Abnormal weight loss: Secondary | ICD-10-CM | POA: Diagnosis not present

## 2016-11-25 DIAGNOSIS — F0151 Vascular dementia with behavioral disturbance: Secondary | ICD-10-CM | POA: Diagnosis not present

## 2016-11-25 DIAGNOSIS — E46 Unspecified protein-calorie malnutrition: Secondary | ICD-10-CM | POA: Diagnosis not present

## 2016-11-25 DIAGNOSIS — R630 Anorexia: Secondary | ICD-10-CM | POA: Diagnosis not present

## 2016-11-25 DIAGNOSIS — I495 Sick sinus syndrome: Secondary | ICD-10-CM | POA: Diagnosis not present

## 2016-11-25 DIAGNOSIS — I4891 Unspecified atrial fibrillation: Secondary | ICD-10-CM | POA: Diagnosis not present

## 2016-11-28 DIAGNOSIS — E46 Unspecified protein-calorie malnutrition: Secondary | ICD-10-CM | POA: Diagnosis not present

## 2016-11-28 DIAGNOSIS — I4891 Unspecified atrial fibrillation: Secondary | ICD-10-CM | POA: Diagnosis not present

## 2016-11-28 DIAGNOSIS — F0151 Vascular dementia with behavioral disturbance: Secondary | ICD-10-CM | POA: Diagnosis not present

## 2016-11-28 DIAGNOSIS — R630 Anorexia: Secondary | ICD-10-CM | POA: Diagnosis not present

## 2016-11-28 DIAGNOSIS — R634 Abnormal weight loss: Secondary | ICD-10-CM | POA: Diagnosis not present

## 2016-11-28 DIAGNOSIS — I495 Sick sinus syndrome: Secondary | ICD-10-CM | POA: Diagnosis not present

## 2016-11-29 DIAGNOSIS — E785 Hyperlipidemia, unspecified: Secondary | ICD-10-CM | POA: Diagnosis not present

## 2016-11-29 DIAGNOSIS — E1159 Type 2 diabetes mellitus with other circulatory complications: Secondary | ICD-10-CM | POA: Diagnosis not present

## 2016-11-29 DIAGNOSIS — H3530 Unspecified macular degeneration: Secondary | ICD-10-CM | POA: Diagnosis not present

## 2016-11-29 DIAGNOSIS — R630 Anorexia: Secondary | ICD-10-CM | POA: Diagnosis not present

## 2016-11-29 DIAGNOSIS — E46 Unspecified protein-calorie malnutrition: Secondary | ICD-10-CM | POA: Diagnosis not present

## 2016-11-29 DIAGNOSIS — I10 Essential (primary) hypertension: Secondary | ICD-10-CM | POA: Diagnosis not present

## 2016-11-29 DIAGNOSIS — R634 Abnormal weight loss: Secondary | ICD-10-CM | POA: Diagnosis not present

## 2016-11-29 DIAGNOSIS — E039 Hypothyroidism, unspecified: Secondary | ICD-10-CM | POA: Diagnosis not present

## 2016-11-29 DIAGNOSIS — F0151 Vascular dementia with behavioral disturbance: Secondary | ICD-10-CM | POA: Diagnosis not present

## 2016-11-29 DIAGNOSIS — I495 Sick sinus syndrome: Secondary | ICD-10-CM | POA: Diagnosis not present

## 2016-11-29 DIAGNOSIS — I4891 Unspecified atrial fibrillation: Secondary | ICD-10-CM | POA: Diagnosis not present

## 2016-11-29 DIAGNOSIS — F339 Major depressive disorder, recurrent, unspecified: Secondary | ICD-10-CM | POA: Diagnosis not present

## 2016-11-29 DIAGNOSIS — M84359S Stress fracture, hip, unspecified, sequela: Secondary | ICD-10-CM | POA: Diagnosis not present

## 2016-11-30 DIAGNOSIS — I4891 Unspecified atrial fibrillation: Secondary | ICD-10-CM | POA: Diagnosis not present

## 2016-11-30 DIAGNOSIS — R630 Anorexia: Secondary | ICD-10-CM | POA: Diagnosis not present

## 2016-11-30 DIAGNOSIS — I495 Sick sinus syndrome: Secondary | ICD-10-CM | POA: Diagnosis not present

## 2016-11-30 DIAGNOSIS — E46 Unspecified protein-calorie malnutrition: Secondary | ICD-10-CM | POA: Diagnosis not present

## 2016-11-30 DIAGNOSIS — F0151 Vascular dementia with behavioral disturbance: Secondary | ICD-10-CM | POA: Diagnosis not present

## 2016-11-30 DIAGNOSIS — R634 Abnormal weight loss: Secondary | ICD-10-CM | POA: Diagnosis not present

## 2016-12-01 DIAGNOSIS — F0151 Vascular dementia with behavioral disturbance: Secondary | ICD-10-CM | POA: Diagnosis not present

## 2016-12-01 DIAGNOSIS — I4891 Unspecified atrial fibrillation: Secondary | ICD-10-CM | POA: Diagnosis not present

## 2016-12-01 DIAGNOSIS — R630 Anorexia: Secondary | ICD-10-CM | POA: Diagnosis not present

## 2016-12-01 DIAGNOSIS — I495 Sick sinus syndrome: Secondary | ICD-10-CM | POA: Diagnosis not present

## 2016-12-01 DIAGNOSIS — E46 Unspecified protein-calorie malnutrition: Secondary | ICD-10-CM | POA: Diagnosis not present

## 2016-12-01 DIAGNOSIS — R634 Abnormal weight loss: Secondary | ICD-10-CM | POA: Diagnosis not present

## 2016-12-02 DIAGNOSIS — I4891 Unspecified atrial fibrillation: Secondary | ICD-10-CM | POA: Diagnosis not present

## 2016-12-02 DIAGNOSIS — I495 Sick sinus syndrome: Secondary | ICD-10-CM | POA: Diagnosis not present

## 2016-12-02 DIAGNOSIS — F0151 Vascular dementia with behavioral disturbance: Secondary | ICD-10-CM | POA: Diagnosis not present

## 2016-12-02 DIAGNOSIS — E46 Unspecified protein-calorie malnutrition: Secondary | ICD-10-CM | POA: Diagnosis not present

## 2016-12-02 DIAGNOSIS — R634 Abnormal weight loss: Secondary | ICD-10-CM | POA: Diagnosis not present

## 2016-12-02 DIAGNOSIS — R630 Anorexia: Secondary | ICD-10-CM | POA: Diagnosis not present

## 2016-12-05 ENCOUNTER — Encounter: Payer: Self-pay | Admitting: Internal Medicine

## 2016-12-05 ENCOUNTER — Non-Acute Institutional Stay (SKILLED_NURSING_FACILITY): Payer: Medicare Other | Admitting: Internal Medicine

## 2016-12-05 DIAGNOSIS — I495 Sick sinus syndrome: Secondary | ICD-10-CM | POA: Diagnosis not present

## 2016-12-05 DIAGNOSIS — F0281 Dementia in other diseases classified elsewhere with behavioral disturbance: Secondary | ICD-10-CM | POA: Diagnosis not present

## 2016-12-05 DIAGNOSIS — I10 Essential (primary) hypertension: Secondary | ICD-10-CM

## 2016-12-05 DIAGNOSIS — F02818 Dementia in other diseases classified elsewhere, unspecified severity, with other behavioral disturbance: Secondary | ICD-10-CM

## 2016-12-05 DIAGNOSIS — R630 Anorexia: Secondary | ICD-10-CM | POA: Diagnosis not present

## 2016-12-05 DIAGNOSIS — E113299 Type 2 diabetes mellitus with mild nonproliferative diabetic retinopathy without macular edema, unspecified eye: Secondary | ICD-10-CM

## 2016-12-05 DIAGNOSIS — E032 Hypothyroidism due to medicaments and other exogenous substances: Secondary | ICD-10-CM | POA: Diagnosis not present

## 2016-12-05 DIAGNOSIS — R634 Abnormal weight loss: Secondary | ICD-10-CM | POA: Diagnosis not present

## 2016-12-05 DIAGNOSIS — E46 Unspecified protein-calorie malnutrition: Secondary | ICD-10-CM | POA: Diagnosis not present

## 2016-12-05 DIAGNOSIS — F0151 Vascular dementia with behavioral disturbance: Secondary | ICD-10-CM | POA: Diagnosis not present

## 2016-12-05 DIAGNOSIS — I4891 Unspecified atrial fibrillation: Secondary | ICD-10-CM | POA: Diagnosis not present

## 2016-12-05 NOTE — Progress Notes (Signed)
Progress Note    Location:  [facility] Room Number: N30 Place of Service:  SNF [phone]) Provider:  Kimber Relic, MD  Patient Care Team: Kimber Relic, MD as PCP - General (Internal Medicine) Mast, Man X, NP as Nurse Practitioner (Nurse Practitioner) [place] [place], [place] Lowella Bandy., MD as Consulting Physician (Urology) Rachael Fee, MD as Consulting Physician (Gastroenterology) Salvatore Marvel, MD as Consulting Physician (Orthopedic Surgery) Venancio Poisson, MD as Consulting Physician (Dermatology) Sheral Apley, MD as Attending Physician (Orthopedic Surgery)  Extended Emergency Contact Information Primary Emergency Contact: Cook,[name] Address: [address]          [place], [place] [address] Darden [place] of [place] Home Phone: [phone] Work Phone: [phone] Mobile Phone: [phone] Relation: Son Secondary Emergency Contact: Gronau,[name] Address: [address]          [place], [place] [address] D[place] of [place] Home Phone: [phone] Relation: Relative  Code Status:  DNR Goals of care: Advanced Directive information Advanced Directives 12/05/2016  Does Patient Have a Medical Advance Directive? Yes  Type of Estate agent of [place];Living will;Out of facility DNR (pink MOST or yellow form)  Does patient want to make changes to medical advance directive? -  Copy of Healthcare Power of Attorney in Chart? Yes  Pre-existing out of facility DNR order (yellow form or pink MOST form) Yellow form placed in chart (order not valid for inpatient use);Pink MOST form placed in chart (order not valid for inpatient use)     Chief Complaint  Patient presents with  . Medical Management of Chronic Issues    routine visit  . diabetic    foot exam     HPI:  Pt is a 83 y.o. female seen today for medical management of chronic diseases.    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 Needs to have A1c done.  Essential hypertension - controlled  Hypothyroidism due to non-medication exogenous substances - needs FU TSH     Past Medical History:  Diagnosis Date  . Abnormality of gait 09/08/2003  . AF (atrial fibrillation) (HCC)   . Anxiety state, unspecified 08/19/2010  . Aortic stenosis   . Atrial fibrillation (HCC) 07/14/2009   Qualifier: Diagnosis of  By: [name], MD, Ruthann Cancer Ty Hilts   . Bradycardia   . Cardiac pacemaker in situ 09/12/2004  . Closed fracture of lumbar vertebra without mention of spinal cord injury 12020/07/1109  . Closed fracture of unspecified trochanteric section of femur 05/29/2011  . Constipation 03/14/2014  . Dementia 2013   04/03/14 MMSE 22/30   . Depression 04/10/2014  . Diffuse cystic mastopathy 12/23/2009  . Disturbance of salivary secretion [id number]  . Edema 08/18/2003  . Fall 12/19/2012   07/22/14 fall VS 144/76, 60, 20, 97.1. No apparent injury   . Flatulence, eructation, and gas pain 12/03/2009  . Fracture of greater trochanter of left femur (HCC) 03/08/14  . FTT (failure to thrive) in adult 12/15/2014  . Hearing loss   . Hemorrhoids   . Hip fracture (HCC)   . HTN (hypertension)   . Hypothyroidism 12/19/2007   Qualifier: Diagnosis of  By: Melvyn Neth CMA (AAMA), Patty  01/16/14 TSH 2.474 03/24/14 TSH 2.576    . 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 Loss of weight 10/13/2014   Multiple  factorials: dementia, depression, advanced age, possible AR of medications(metformin and Oxybutynin)-will increase Mirtazapine and continue supplement. Observe.    . Macular degeneration (senile) of retina, unspecified 12/12/2001  . Macular degeneration of both eyes 01/24/2013   Severe visual impairment. Nearly blind.   . Mitral valve prolapse   . Occlusion and stenosis of carotid artery without mention of  cerebral infarction 12/27/2007  . Other and unspecified hyperlipidemia   . Other malaise and fatigue 12/06/2007  . Pacemaker-dual  medtronic 09/07/2011  . Personal history of fall 01/29/2009  . Rectal bleeding   . Senile osteoporosis 12/13/2000  . Sinus node dysfunction (HCC)   . Syncope and collapse 12/27/2007  . Trochanteric fracture of left femur (HCC) 03/14/2014   CT scan did however reveal acute fracture in greater left trochanteric. Dr. Margarita Rana consulted. Recommended nonoperative management and weightbearing as tolerated and another 3 weeks of Lovenox for DVT prophylaxis.       . Type 2 diabetes mellitus with blindness, with macular edema, with severe nonproliferative retinopathy 12/19/2007   Qualifier: Diagnosis of  By: Melvyn Neth CMA (AAMA), Patty  01/09/14 Hgb A1c 6.5 01/16/14 Hgb A1c 6.5 03/24/14 Hgb 6.4 12/10/14 pharm: dc Metformin and CBG monitoring-failure to thrive and refusing to eat.     Marland Kitchen Unspecified hereditary and idiopathic peripheral neuropathy 10/28/202012  . Unspecified hypothyroidism 03/18/2010  . Unspecified urinary incontinence 02/22/2005  . Urinary tract infection, site not specified 04/10/2014  . Xerophthalmus 01/21/2015   Past Surgical History:  Procedure Laterality Date  . BREAST BIOPSY Left 1970   benign  . Left hip surgery  09/2005  . PACEMAKER INSERTION  2006   Medtronic In-Pulse  . Right hip surgery  08/2003  . TONSILLECTOMY    . 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 Codeine     unknown  . Ibuprofen     unknown  . Lisinopril     unknown  . Penicillins     unknown  . Sanctura [Trospium Chloride]     unknown    Outpatient Encounter Prescriptions as of 12/05/2016  Medication Sig  . acetaminophen (TYLENOL) 325 MG tablet Take 650 mg by mouth every 4 (four) hours as needed for mild pain or moderate pain.   Marland Kitchen atropine 1 % ophthalmic solution Place 4 drops on  tongue  as needed for excess secretions  . bisacodyl (DULCOLAX) 10 MG suppository Place 10 mg rectally as needed for moderate constipation.  Marland Kitchen escitalopram (LEXAPRO) 10 MG tablet Take 10 mg by mouth. Take one and half tablets to equal 15 mg once a day  . LORazepam (ATIVAN) 1 MG tablet Take 1 mg by mouth. Take one at 12:00 noon and  at 4 pm, for 60 days (01/16/17)  Take one tablet every 2 hours as needed for anxiety .  . morphine (ROXANOL) 20 MG/ML concentrated solution Give 0.66ml (5mg ) sublingual under tongue  every two hours as needed for pain  . sennosides-docusate sodium (SENOKOT-S) 8.6-50 MG tablet Take 2 tablets by mouth at bedtime. Reported on 12/11/2015   No facility-administered encounter medications on file as of 12/05/2016.     Review of Systems  Constitutional: Negative for chills and fever.       Emaciated  HENT: Positive for hearing loss. Negative for congestion, ear discharge and ear pain.   Eyes: Negative for pain, discharge and redness.       Low  vision Dry eyes  Respiratory: Negative for cough, shortness of breath, wheezing and stridor.        Occasional hacking cough, non productive, no O2 desat, afebrile.  Cardiovascular: Negative for chest pain, palpitations and leg swelling.  Gastrointestinal: Negative for abdominal pain, constipation, nausea and vomiting.  Endocrine:       Hx of abnormal TSH  Genitourinary: Positive for frequency. Negative for dysuria and urgency.  Musculoskeletal: Positive for arthralgias and gait problem. Negative for back pain, myalgias and neck pain.       Left hip/leg pain.   Skin: Negative for rash.  Neurological: Negative for dizziness, tremors, seizures, weakness and headaches.       Severre dementia  Hematological: Negative.   Psychiatric/Behavioral: Positive for confusion and decreased concentration. Negative for hallucinations and suicidal ideas. The patient is nervous/anxious.     Immunization History  Administered Date(s) Administered   . Influenza-Unspecified 06/06/2013, 04/21/2015, 05/17/2016  . PPD Test 07/23/2011  . Pneumococcal Polysaccharide-23 03/14/2009  . Td 03/14/2009  . Zoster 06/29/2006   Pertinent  Health Maintenance Due  Topic Date Due  . OPHTHALMOLOGY EXAM  07/28/1927  . URINE MICROALBUMIN  07/28/1927  . DEXA SCAN  07/27/1982  . PNA vac Low Risk Adult (2 of 2 - PCV13) 03/14/2010  . HEMOGLOBIN A1C  07/29/2015  . FOOT EXAM  01/26/2016  . INFLUENZA VACCINE  03/01/2017   Fall Risk  01/26/2015 03/06/2014  Falls in the past year? Yes Yes  Number falls in past yr: 1 1  Injury with Fall? Yes Yes  Risk Factor Category  - High Fall Risk  Risk for fall due to : History of fall(s);Impaired balance/gait;Impaired mobility -  Follow up Falls evaluation completed -     Vitals:   12/05/16 1245  BP: 118/68  Pulse: 68  Resp: 18  Temp: 97.8 F (36.6 C)  SpO2: 97%  Weight: 94 lb 11.2 oz (43 kg)  Height: 5\' 2"  (1.575 m)   Body mass index is 17.32 kg/m.  Wt Readings from Last 3 Encounters:  12/05/16 94 lb 11.2 oz (43 kg)  11/10/16 93 lb 8 oz (42.4 kg)  11/02/16 93 lb 8 oz (42.4 kg)    Physical Exam  Constitutional:  Extremely thin  HENT:  Head: Normocephalic and atraumatic.  Right Ear: External ear normal.  Left Ear: External ear normal.  Nose: Nose normal.  Mouth/Throat: Oropharynx is clear and moist. No oropharyngeal exudate.  Eyes: Conjunctivae and EOM are normal. Pupils are equal, round, and reactive to light. Right eye exhibits no discharge. Left eye exhibits no discharge. No scleral icterus.  Dry eyes. Legally blind.  Neck: Normal range of motion. Neck supple. No JVD present. No thyromegaly present.  Cardiovascular: Normal rate.   Murmur heard. Pacemaker. Systolic ejection murmur 3/6  Pulmonary/Chest: Effort normal and breath sounds normal. No respiratory distress. She has no wheezes. She has no rales. She exhibits no tenderness.  Abdominal: Soft. Bowel sounds are normal. She exhibits no  distension. There is no tenderness. There is no rebound.  Musculoskeletal: Normal range of motion. She exhibits tenderness. She exhibits no edema.  Left hip and leg pain  Lymphadenopathy:    She has no cervical adenopathy.  Neurological: She is alert. She has normal reflexes. No cranial nerve deficit. She exhibits normal muscle tone. Coordination normal.  Skin: Skin is warm and dry. No rash noted. She is not diaphoretic. No erythema. No pallor.  Psychiatric: Thought content normal. Her mood appears not anxious. Her  affect is not angry, not blunt, not labile and not inappropriate. Her speech is not rapid and/or pressured, not delayed and not slurred. She is slowed. She is not agitated, not aggressive, not hyperactive, not withdrawn, not actively hallucinating and not combative. Thought content is not paranoid and not delusional. Cognition and memory are impaired. She expresses impulsivity and inappropriate judgment. She exhibits a depressed mood. She exhibits abnormal recent memory.  Confusion, HOH, very low vision. Guards against examination. She is attentive.    Labs reviewed: No results for input(s): NA, K, CL, CO2, GLUCOSE, BUN, CREATININE, CALCIUM, MG, PHOS in the last 8760 hours. No results for input(s): AST, ALT, ALKPHOS, BILITOT, PROT, ALBUMIN in the last 8760 hours. No results for input(s): WBC, NEUTROABS, HGB, HCT, MCV, PLT in the last 8760 hours. Lab Results  Component Value Date   TSH 3.55 01/27/2015   Lab Results  Component Value Date   HGBA1C 5.8 01/27/2015   Lab Results  Component Value Date   CHOL 140 12/20/2012   HDL 38 12/20/2012   LDLCALC 57 12/20/2012   TRIG 227 (A) 12/20/2012   CHOLHDL 5.0 12/22/2007    Assessment/Plan 1. Dementia associated with other underlying disease with behavioral disturbance unchanged  2. 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 Essential hypertension -CMP  4. Hypothyroidism due to  non-medication exogenous substances -TSH

## 2016-12-06 DIAGNOSIS — I495 Sick sinus syndrome: Secondary | ICD-10-CM | POA: Diagnosis not present

## 2016-12-06 DIAGNOSIS — R630 Anorexia: Secondary | ICD-10-CM | POA: Diagnosis not present

## 2016-12-06 DIAGNOSIS — R634 Abnormal weight loss: Secondary | ICD-10-CM | POA: Diagnosis not present

## 2016-12-06 DIAGNOSIS — I4891 Unspecified atrial fibrillation: Secondary | ICD-10-CM | POA: Diagnosis not present

## 2016-12-06 DIAGNOSIS — F0151 Vascular dementia with behavioral disturbance: Secondary | ICD-10-CM | POA: Diagnosis not present

## 2016-12-06 DIAGNOSIS — E46 Unspecified protein-calorie malnutrition: Secondary | ICD-10-CM | POA: Diagnosis not present

## 2016-12-06 LAB — BASIC METABOLIC PANEL
BUN: 24 mg/dL — AB (ref 4–21)
CREATININE: 0.9 mg/dL (ref <=1.1)
Glucose: 94 mg/dL
Potassium: 3.9 mmol/L (ref 3.4–5.3)
Sodium: 142 mmol/L (ref 137–147)

## 2016-12-06 LAB — HEPATIC FUNCTION PANEL
ALT: 11 U/L (ref 7–35)
AST: 21 U/L (ref 13–35)
Alkaline Phosphatase: 70 U/L (ref 25–125)
Bilirubin, Total: 0.5 mg/dL

## 2016-12-06 LAB — HEMOGLOBIN A1C: HEMOGLOBIN A1C: 5.2

## 2016-12-06 LAB — MICROALBUMIN, URINE: MICROALB UR: 20.6

## 2016-12-07 ENCOUNTER — Other Ambulatory Visit: Payer: Self-pay | Admitting: *Deleted

## 2016-12-07 DIAGNOSIS — R634 Abnormal weight loss: Secondary | ICD-10-CM | POA: Diagnosis not present

## 2016-12-07 DIAGNOSIS — I495 Sick sinus syndrome: Secondary | ICD-10-CM | POA: Diagnosis not present

## 2016-12-07 DIAGNOSIS — F0151 Vascular dementia with behavioral disturbance: Secondary | ICD-10-CM | POA: Diagnosis not present

## 2016-12-07 DIAGNOSIS — E46 Unspecified protein-calorie malnutrition: Secondary | ICD-10-CM | POA: Diagnosis not present

## 2016-12-07 DIAGNOSIS — R630 Anorexia: Secondary | ICD-10-CM | POA: Diagnosis not present

## 2016-12-07 DIAGNOSIS — I4891 Unspecified atrial fibrillation: Secondary | ICD-10-CM | POA: Diagnosis not present

## 2016-12-08 DIAGNOSIS — R634 Abnormal weight loss: Secondary | ICD-10-CM | POA: Diagnosis not present

## 2016-12-08 DIAGNOSIS — E46 Unspecified protein-calorie malnutrition: Secondary | ICD-10-CM | POA: Diagnosis not present

## 2016-12-08 DIAGNOSIS — F0151 Vascular dementia with behavioral disturbance: Secondary | ICD-10-CM | POA: Diagnosis not present

## 2016-12-08 DIAGNOSIS — I495 Sick sinus syndrome: Secondary | ICD-10-CM | POA: Diagnosis not present

## 2016-12-08 DIAGNOSIS — R630 Anorexia: Secondary | ICD-10-CM | POA: Diagnosis not present

## 2016-12-08 DIAGNOSIS — I4891 Unspecified atrial fibrillation: Secondary | ICD-10-CM | POA: Diagnosis not present

## 2016-12-09 DIAGNOSIS — I4891 Unspecified atrial fibrillation: Secondary | ICD-10-CM | POA: Diagnosis not present

## 2016-12-09 DIAGNOSIS — R630 Anorexia: Secondary | ICD-10-CM | POA: Diagnosis not present

## 2016-12-09 DIAGNOSIS — R634 Abnormal weight loss: Secondary | ICD-10-CM | POA: Diagnosis not present

## 2016-12-09 DIAGNOSIS — E46 Unspecified protein-calorie malnutrition: Secondary | ICD-10-CM | POA: Diagnosis not present

## 2016-12-09 DIAGNOSIS — I495 Sick sinus syndrome: Secondary | ICD-10-CM | POA: Diagnosis not present

## 2016-12-09 DIAGNOSIS — F0151 Vascular dementia with behavioral disturbance: Secondary | ICD-10-CM | POA: Diagnosis not present

## 2016-12-12 DIAGNOSIS — F0151 Vascular dementia with behavioral disturbance: Secondary | ICD-10-CM | POA: Diagnosis not present

## 2016-12-12 DIAGNOSIS — I4891 Unspecified atrial fibrillation: Secondary | ICD-10-CM | POA: Diagnosis not present

## 2016-12-12 DIAGNOSIS — I495 Sick sinus syndrome: Secondary | ICD-10-CM | POA: Diagnosis not present

## 2016-12-12 DIAGNOSIS — R630 Anorexia: Secondary | ICD-10-CM | POA: Diagnosis not present

## 2016-12-12 DIAGNOSIS — R634 Abnormal weight loss: Secondary | ICD-10-CM | POA: Diagnosis not present

## 2016-12-12 DIAGNOSIS — E46 Unspecified protein-calorie malnutrition: Secondary | ICD-10-CM | POA: Diagnosis not present

## 2016-12-13 DIAGNOSIS — F0151 Vascular dementia with behavioral disturbance: Secondary | ICD-10-CM | POA: Diagnosis not present

## 2016-12-13 DIAGNOSIS — I4891 Unspecified atrial fibrillation: Secondary | ICD-10-CM | POA: Diagnosis not present

## 2016-12-13 DIAGNOSIS — I495 Sick sinus syndrome: Secondary | ICD-10-CM | POA: Diagnosis not present

## 2016-12-13 DIAGNOSIS — R634 Abnormal weight loss: Secondary | ICD-10-CM | POA: Diagnosis not present

## 2016-12-13 DIAGNOSIS — E46 Unspecified protein-calorie malnutrition: Secondary | ICD-10-CM | POA: Diagnosis not present

## 2016-12-13 DIAGNOSIS — R630 Anorexia: Secondary | ICD-10-CM | POA: Diagnosis not present

## 2016-12-14 DIAGNOSIS — R634 Abnormal weight loss: Secondary | ICD-10-CM | POA: Diagnosis not present

## 2016-12-14 DIAGNOSIS — F0151 Vascular dementia with behavioral disturbance: Secondary | ICD-10-CM | POA: Diagnosis not present

## 2016-12-14 DIAGNOSIS — I495 Sick sinus syndrome: Secondary | ICD-10-CM | POA: Diagnosis not present

## 2016-12-14 DIAGNOSIS — I4891 Unspecified atrial fibrillation: Secondary | ICD-10-CM | POA: Diagnosis not present

## 2016-12-14 DIAGNOSIS — E46 Unspecified protein-calorie malnutrition: Secondary | ICD-10-CM | POA: Diagnosis not present

## 2016-12-14 DIAGNOSIS — R630 Anorexia: Secondary | ICD-10-CM | POA: Diagnosis not present

## 2016-12-15 DIAGNOSIS — R634 Abnormal weight loss: Secondary | ICD-10-CM | POA: Diagnosis not present

## 2016-12-15 DIAGNOSIS — E46 Unspecified protein-calorie malnutrition: Secondary | ICD-10-CM | POA: Diagnosis not present

## 2016-12-15 DIAGNOSIS — I4891 Unspecified atrial fibrillation: Secondary | ICD-10-CM | POA: Diagnosis not present

## 2016-12-15 DIAGNOSIS — F0151 Vascular dementia with behavioral disturbance: Secondary | ICD-10-CM | POA: Diagnosis not present

## 2016-12-15 DIAGNOSIS — R630 Anorexia: Secondary | ICD-10-CM | POA: Diagnosis not present

## 2016-12-15 DIAGNOSIS — I495 Sick sinus syndrome: Secondary | ICD-10-CM | POA: Diagnosis not present

## 2016-12-16 DIAGNOSIS — I4891 Unspecified atrial fibrillation: Secondary | ICD-10-CM | POA: Diagnosis not present

## 2016-12-16 DIAGNOSIS — R634 Abnormal weight loss: Secondary | ICD-10-CM | POA: Diagnosis not present

## 2016-12-16 DIAGNOSIS — E46 Unspecified protein-calorie malnutrition: Secondary | ICD-10-CM | POA: Diagnosis not present

## 2016-12-16 DIAGNOSIS — R630 Anorexia: Secondary | ICD-10-CM | POA: Diagnosis not present

## 2016-12-16 DIAGNOSIS — I495 Sick sinus syndrome: Secondary | ICD-10-CM | POA: Diagnosis not present

## 2016-12-16 DIAGNOSIS — F0151 Vascular dementia with behavioral disturbance: Secondary | ICD-10-CM | POA: Diagnosis not present

## 2016-12-19 DIAGNOSIS — I495 Sick sinus syndrome: Secondary | ICD-10-CM | POA: Diagnosis not present

## 2016-12-19 DIAGNOSIS — F0151 Vascular dementia with behavioral disturbance: Secondary | ICD-10-CM | POA: Diagnosis not present

## 2016-12-19 DIAGNOSIS — R634 Abnormal weight loss: Secondary | ICD-10-CM | POA: Diagnosis not present

## 2016-12-19 DIAGNOSIS — E46 Unspecified protein-calorie malnutrition: Secondary | ICD-10-CM | POA: Diagnosis not present

## 2016-12-19 DIAGNOSIS — I4891 Unspecified atrial fibrillation: Secondary | ICD-10-CM | POA: Diagnosis not present

## 2016-12-19 DIAGNOSIS — R630 Anorexia: Secondary | ICD-10-CM | POA: Diagnosis not present

## 2016-12-20 DIAGNOSIS — I495 Sick sinus syndrome: Secondary | ICD-10-CM | POA: Diagnosis not present

## 2016-12-20 DIAGNOSIS — E46 Unspecified protein-calorie malnutrition: Secondary | ICD-10-CM | POA: Diagnosis not present

## 2016-12-20 DIAGNOSIS — F0151 Vascular dementia with behavioral disturbance: Secondary | ICD-10-CM | POA: Diagnosis not present

## 2016-12-20 DIAGNOSIS — R630 Anorexia: Secondary | ICD-10-CM | POA: Diagnosis not present

## 2016-12-20 DIAGNOSIS — I4891 Unspecified atrial fibrillation: Secondary | ICD-10-CM | POA: Diagnosis not present

## 2016-12-20 DIAGNOSIS — R634 Abnormal weight loss: Secondary | ICD-10-CM | POA: Diagnosis not present

## 2016-12-21 DIAGNOSIS — R630 Anorexia: Secondary | ICD-10-CM | POA: Diagnosis not present

## 2016-12-21 DIAGNOSIS — R634 Abnormal weight loss: Secondary | ICD-10-CM | POA: Diagnosis not present

## 2016-12-21 DIAGNOSIS — I495 Sick sinus syndrome: Secondary | ICD-10-CM | POA: Diagnosis not present

## 2016-12-21 DIAGNOSIS — F0151 Vascular dementia with behavioral disturbance: Secondary | ICD-10-CM | POA: Diagnosis not present

## 2016-12-21 DIAGNOSIS — I4891 Unspecified atrial fibrillation: Secondary | ICD-10-CM | POA: Diagnosis not present

## 2016-12-21 DIAGNOSIS — E46 Unspecified protein-calorie malnutrition: Secondary | ICD-10-CM | POA: Diagnosis not present

## 2016-12-22 DIAGNOSIS — I4891 Unspecified atrial fibrillation: Secondary | ICD-10-CM | POA: Diagnosis not present

## 2016-12-22 DIAGNOSIS — R630 Anorexia: Secondary | ICD-10-CM | POA: Diagnosis not present

## 2016-12-22 DIAGNOSIS — I495 Sick sinus syndrome: Secondary | ICD-10-CM | POA: Diagnosis not present

## 2016-12-22 DIAGNOSIS — R634 Abnormal weight loss: Secondary | ICD-10-CM | POA: Diagnosis not present

## 2016-12-22 DIAGNOSIS — E46 Unspecified protein-calorie malnutrition: Secondary | ICD-10-CM | POA: Diagnosis not present

## 2016-12-22 DIAGNOSIS — F0151 Vascular dementia with behavioral disturbance: Secondary | ICD-10-CM | POA: Diagnosis not present

## 2016-12-23 DIAGNOSIS — I4891 Unspecified atrial fibrillation: Secondary | ICD-10-CM | POA: Diagnosis not present

## 2016-12-23 DIAGNOSIS — I495 Sick sinus syndrome: Secondary | ICD-10-CM | POA: Diagnosis not present

## 2016-12-23 DIAGNOSIS — F0151 Vascular dementia with behavioral disturbance: Secondary | ICD-10-CM | POA: Diagnosis not present

## 2016-12-23 DIAGNOSIS — E46 Unspecified protein-calorie malnutrition: Secondary | ICD-10-CM | POA: Diagnosis not present

## 2016-12-23 DIAGNOSIS — R634 Abnormal weight loss: Secondary | ICD-10-CM | POA: Diagnosis not present

## 2016-12-23 DIAGNOSIS — R630 Anorexia: Secondary | ICD-10-CM | POA: Diagnosis not present

## 2016-12-27 DIAGNOSIS — R630 Anorexia: Secondary | ICD-10-CM | POA: Diagnosis not present

## 2016-12-27 DIAGNOSIS — I4891 Unspecified atrial fibrillation: Secondary | ICD-10-CM | POA: Diagnosis not present

## 2016-12-27 DIAGNOSIS — F0151 Vascular dementia with behavioral disturbance: Secondary | ICD-10-CM | POA: Diagnosis not present

## 2016-12-27 DIAGNOSIS — E46 Unspecified protein-calorie malnutrition: Secondary | ICD-10-CM | POA: Diagnosis not present

## 2016-12-27 DIAGNOSIS — I495 Sick sinus syndrome: Secondary | ICD-10-CM | POA: Diagnosis not present

## 2016-12-27 DIAGNOSIS — R634 Abnormal weight loss: Secondary | ICD-10-CM | POA: Diagnosis not present

## 2016-12-28 DIAGNOSIS — F0151 Vascular dementia with behavioral disturbance: Secondary | ICD-10-CM | POA: Diagnosis not present

## 2016-12-28 DIAGNOSIS — R634 Abnormal weight loss: Secondary | ICD-10-CM | POA: Diagnosis not present

## 2016-12-28 DIAGNOSIS — I4891 Unspecified atrial fibrillation: Secondary | ICD-10-CM | POA: Diagnosis not present

## 2016-12-28 DIAGNOSIS — E46 Unspecified protein-calorie malnutrition: Secondary | ICD-10-CM | POA: Diagnosis not present

## 2016-12-28 DIAGNOSIS — I495 Sick sinus syndrome: Secondary | ICD-10-CM | POA: Diagnosis not present

## 2016-12-28 DIAGNOSIS — R630 Anorexia: Secondary | ICD-10-CM | POA: Diagnosis not present

## 2016-12-29 DIAGNOSIS — I4891 Unspecified atrial fibrillation: Secondary | ICD-10-CM | POA: Diagnosis not present

## 2016-12-29 DIAGNOSIS — E46 Unspecified protein-calorie malnutrition: Secondary | ICD-10-CM | POA: Diagnosis not present

## 2016-12-29 DIAGNOSIS — I495 Sick sinus syndrome: Secondary | ICD-10-CM | POA: Diagnosis not present

## 2016-12-29 DIAGNOSIS — R634 Abnormal weight loss: Secondary | ICD-10-CM | POA: Diagnosis not present

## 2016-12-29 DIAGNOSIS — R630 Anorexia: Secondary | ICD-10-CM | POA: Diagnosis not present

## 2016-12-29 DIAGNOSIS — F0151 Vascular dementia with behavioral disturbance: Secondary | ICD-10-CM | POA: Diagnosis not present

## 2016-12-30 DIAGNOSIS — E039 Hypothyroidism, unspecified: Secondary | ICD-10-CM | POA: Diagnosis not present

## 2016-12-30 DIAGNOSIS — M84359S Stress fracture, hip, unspecified, sequela: Secondary | ICD-10-CM | POA: Diagnosis not present

## 2016-12-30 DIAGNOSIS — F339 Major depressive disorder, recurrent, unspecified: Secondary | ICD-10-CM | POA: Diagnosis not present

## 2016-12-30 DIAGNOSIS — I10 Essential (primary) hypertension: Secondary | ICD-10-CM | POA: Diagnosis not present

## 2016-12-30 DIAGNOSIS — R634 Abnormal weight loss: Secondary | ICD-10-CM | POA: Diagnosis not present

## 2016-12-30 DIAGNOSIS — F0151 Vascular dementia with behavioral disturbance: Secondary | ICD-10-CM | POA: Diagnosis not present

## 2016-12-30 DIAGNOSIS — R630 Anorexia: Secondary | ICD-10-CM | POA: Diagnosis not present

## 2016-12-30 DIAGNOSIS — I4891 Unspecified atrial fibrillation: Secondary | ICD-10-CM | POA: Diagnosis not present

## 2016-12-30 DIAGNOSIS — H3530 Unspecified macular degeneration: Secondary | ICD-10-CM | POA: Diagnosis not present

## 2016-12-30 DIAGNOSIS — E1159 Type 2 diabetes mellitus with other circulatory complications: Secondary | ICD-10-CM | POA: Diagnosis not present

## 2016-12-30 DIAGNOSIS — E46 Unspecified protein-calorie malnutrition: Secondary | ICD-10-CM | POA: Diagnosis not present

## 2016-12-30 DIAGNOSIS — E785 Hyperlipidemia, unspecified: Secondary | ICD-10-CM | POA: Diagnosis not present

## 2016-12-30 DIAGNOSIS — I495 Sick sinus syndrome: Secondary | ICD-10-CM | POA: Diagnosis not present

## 2017-01-02 DIAGNOSIS — E46 Unspecified protein-calorie malnutrition: Secondary | ICD-10-CM | POA: Diagnosis not present

## 2017-01-02 DIAGNOSIS — I4891 Unspecified atrial fibrillation: Secondary | ICD-10-CM | POA: Diagnosis not present

## 2017-01-02 DIAGNOSIS — R634 Abnormal weight loss: Secondary | ICD-10-CM | POA: Diagnosis not present

## 2017-01-02 DIAGNOSIS — I495 Sick sinus syndrome: Secondary | ICD-10-CM | POA: Diagnosis not present

## 2017-01-02 DIAGNOSIS — R630 Anorexia: Secondary | ICD-10-CM | POA: Diagnosis not present

## 2017-01-02 DIAGNOSIS — F0151 Vascular dementia with behavioral disturbance: Secondary | ICD-10-CM | POA: Diagnosis not present

## 2017-01-03 ENCOUNTER — Non-Acute Institutional Stay (SKILLED_NURSING_FACILITY): Payer: Medicare Other | Admitting: Nurse Practitioner

## 2017-01-03 ENCOUNTER — Encounter: Payer: Self-pay | Admitting: Nurse Practitioner

## 2017-01-03 DIAGNOSIS — R630 Anorexia: Secondary | ICD-10-CM | POA: Diagnosis not present

## 2017-01-03 DIAGNOSIS — E032 Hypothyroidism due to medicaments and other exogenous substances: Secondary | ICD-10-CM

## 2017-01-03 DIAGNOSIS — F02818 Dementia in other diseases classified elsewhere, unspecified severity, with other behavioral disturbance: Secondary | ICD-10-CM

## 2017-01-03 DIAGNOSIS — E113299 Type 2 diabetes mellitus with mild nonproliferative diabetic retinopathy without macular edema, unspecified eye: Secondary | ICD-10-CM

## 2017-01-03 DIAGNOSIS — F0151 Vascular dementia with behavioral disturbance: Secondary | ICD-10-CM | POA: Diagnosis not present

## 2017-01-03 DIAGNOSIS — F0281 Dementia in other diseases classified elsewhere with behavioral disturbance: Secondary | ICD-10-CM

## 2017-01-03 DIAGNOSIS — I10 Essential (primary) hypertension: Secondary | ICD-10-CM | POA: Diagnosis not present

## 2017-01-03 DIAGNOSIS — I481 Persistent atrial fibrillation: Secondary | ICD-10-CM

## 2017-01-03 DIAGNOSIS — F329 Major depressive disorder, single episode, unspecified: Secondary | ICD-10-CM | POA: Diagnosis not present

## 2017-01-03 DIAGNOSIS — I4891 Unspecified atrial fibrillation: Secondary | ICD-10-CM | POA: Diagnosis not present

## 2017-01-03 DIAGNOSIS — R634 Abnormal weight loss: Secondary | ICD-10-CM | POA: Diagnosis not present

## 2017-01-03 DIAGNOSIS — K5900 Constipation, unspecified: Secondary | ICD-10-CM

## 2017-01-03 DIAGNOSIS — R627 Adult failure to thrive: Secondary | ICD-10-CM | POA: Diagnosis not present

## 2017-01-03 DIAGNOSIS — F419 Anxiety disorder, unspecified: Secondary | ICD-10-CM

## 2017-01-03 DIAGNOSIS — I495 Sick sinus syndrome: Secondary | ICD-10-CM | POA: Diagnosis not present

## 2017-01-03 DIAGNOSIS — E46 Unspecified protein-calorie malnutrition: Secondary | ICD-10-CM | POA: Diagnosis not present

## 2017-01-03 DIAGNOSIS — I4819 Other persistent atrial fibrillation: Secondary | ICD-10-CM

## 2017-01-03 NOTE — Progress Notes (Signed)
Location:  [facility] Room Number: 30 Place of Service:  SNF (31) Provider:  Mast, Manxie  NP  Kimber Relic, MD  Patient Care Team: Kimber Relic, MD as PCP - General (Internal Medicine) Mast, Man X, NP as Nurse Practitioner (Nurse Practitioner) [place] [place], [place] Lowella Bandy., MD as Consulting Physician (Urology) Rachael Fee, MD as Consulting Physician (Gastroenterology) Salvatore Marvel, MD as Consulting Physician (Orthopedic Surgery) Venancio Poisson, MD as Consulting Physician (Dermatology) Sheral Apley, MD as Attending Physician (Orthopedic Surgery)  Extended Emergency Contact Information Primary Emergency Contact: Cook,[name] Address: [address]          [place], [place] [address] [place] of [place] Home Phone: [phone] Work Phone: [phone] Mobile Phone: [phone] Relation: Son Secondary Emergency Contact: Slevin,[name] Address: [address]          [place], [place] [address] [place] of [place] Home Phone: [phone] Relation: Relative  Code Status:  DNR Goals of care: Advanced Directive information Advanced Directives 01/03/2017  Does Patient Have a Medical Advance Directive? Yes  Type of Estate agent of [place];Living will;Out of facility DNR (pink MOST or yellow form)  Does patient want to make changes to medical advance directive? No - Patient declined  Copy of Healthcare Power of Attorney in Chart? Yes  Pre-existing out of facility DNR order (yellow form or pink MOST form) Yellow form placed in chart (order not valid for inpatient use)     Chief Complaint  Patient presents with  . Medical Management of Chronic Issues    HPI:  Pt is a 83 y.o. female seen today for medical management of chronic diseases.     Lorazepam 1mg  bid and q2h prn are needed for her mood and the goal of care.                           Hx of hypothyroidism, last TSH 2.52 12/07/16 off Levothyroxine  12.36mcg. Her blood sugar is controlled on diet, last Hgb A1c 5.2 12/06/16. Blood pressure and heart rate are controlled offmetoprolol. Her mood is managed on Lexapro 10mg  , dementia requires SNFfor care needs, her urinary incontinent is chronic and managed with adult depends. She has BM daily on Senokot S II qd, Dulcolax prn.  FTT persisted. Healed left superior pubic ramus fx at the acetabulum hairline extension into the hip joint. Her goal of care is comfort measures, prn Morphine, Lorazepam 1mg  4pm and q2h prn, Tylenol available to her.    Past Medical History:  Diagnosis Date  . Abnormality of gait 09/08/2003  . AF (atrial fibrillation) (HCC)   . Anxiety state, unspecified 08/19/2010  . Aortic stenosis   . Atrial fibrillation (HCC) 07/14/2009   Qualifier: Diagnosis of  By: [name], MD, Ruthann Cancer Ty Hilts   . Bradycardia   . Cardiac pacemaker in situ 09/12/2004  . Closed fracture of lumbar vertebra without mention of spinal cord injury 12020/08/3008  . Closed fracture of unspecified trochanteric section of femur 05/29/2011  . Constipation 03/14/2014  . Dementia 2013   04/03/14 MMSE 22/30   . Depression 04/10/2014  . Diffuse cystic mastopathy 12/23/2009  . Disturbance of salivary secretion [id number]  . Edema 08/18/2003  . Fall 12/19/2012   07/22/14 fall VS 144/76, 60, 20, 97.1. No apparent injury   . Flatulence, eructation, and gas pain 12/03/2009  . Fracture of greater trochanter of left femur (HCC) 03/08/14  . FTT (failure  to thrive) in adult 12/15/2014  . Hearing loss   . Hemorrhoids   . Hip fracture (HCC)   . HTN (hypertension)   . Hypothyroidism 12/19/2007   Qualifier: Diagnosis of  By: Melvyn Neth CMA (AAMA), Patty  01/16/14 TSH 2.474 03/24/14 TSH 2.576    . 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 Loss of weight 10/13/2014   Multiple factorials: dementia, depression, advanced age, possible AR of medications(metformin and Oxybutynin)-will increase Mirtazapine  and continue supplement. Observe.    . Macular degeneration (senile) of retina, unspecified 12/12/2001  . Macular degeneration of both eyes 01/24/2013   Severe visual impairment. Nearly blind.   . Mitral valve prolapse   . Occlusion and stenosis of carotid artery without mention of cerebral infarction 12/27/2007  . Other and unspecified hyperlipidemia   . Other malaise and fatigue 12/06/2007  . Pacemaker-dual  medtronic 09/07/2011  . Personal history of fall 01/29/2009  . Rectal bleeding   . Senile osteoporosis 12/13/2000  . Sinus node dysfunction (HCC)   . Syncope and collapse 12/27/2007  . Trochanteric fracture of left femur (HCC) 03/14/2014   CT scan did however reveal acute fracture in greater left trochanteric. Dr. Margarita Rana consulted. Recommended nonoperative management and weightbearing as tolerated and another 3 weeks of Lovenox for DVT prophylaxis.       . Type 2 diabetes mellitus with blindness, with macular edema, with severe nonproliferative retinopathy 12/19/2007   Qualifier: Diagnosis of  By: Melvyn Neth CMA (AAMA), Patty  01/09/14 Hgb A1c 6.5 01/16/14 Hgb A1c 6.5 03/24/14 Hgb 6.4 12/10/14 pharm: dc Metformin and CBG monitoring-failure to thrive and refusing to eat.     Marland Kitchen Unspecified hereditary and idiopathic peripheral neuropathy 10/28/202012  . Unspecified hypothyroidism 03/18/2010  . Unspecified urinary incontinence 02/22/2005  . Urinary tract infection, site not specified 04/10/2014  . Xerophthalmus 01/21/2015   Past Surgical History:  Procedure Laterality Date  . BREAST BIOPSY Left 1970   benign  . Left hip surgery  09/2005  . PACEMAKER INSERTION  2006   Medtronic In-Pulse  . Right hip surgery  08/2003  . TONSILLECTOMY    . 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 Codeine     unknown  . Ibuprofen     unknown  . Lisinopril     unknown  . Penicillins     unknown  . Sanctura  [Trospium Chloride]     unknown    Outpatient Encounter Prescriptions as of 01/03/2017  Medication Sig  . acetaminophen (TYLENOL) 325 MG tablet Take 650 mg by mouth every 4 (four) hours as needed for mild pain or moderate pain.   Marland Kitchen atropine 1 % ophthalmic solution Place 4 drops on tongue  as needed for excess secretions  . bisacodyl (DULCOLAX) 10 MG suppository Place 10 mg rectally as needed for moderate constipation.  Marland Kitchen escitalopram (LEXAPRO) 10 MG tablet Take 10 mg by mouth. Take one and half tablets to equal 15 mg once a day  . LORazepam (ATIVAN) 1 MG tablet Take 1 mg by mouth. Take one at 12:00 noon and  at 4 pm, for 60 days (01/16/17)  Take one tablet every 2 hours as needed for anxiety .  . morphine (ROXANOL) 20 MG/ML concentrated solution Give 0.70ml (5mg ) sublingual under tongue  every two hours as needed for pain  . sennosides-docusate sodium (SENOKOT-S) 8.6-50 MG  tablet Take 2 tablets by mouth at bedtime. Reported on 12/11/2015   No facility-administered encounter medications on file as of 01/03/2017.     Review of Systems  Constitutional: Negative for chills and fever.       Emaciated  HENT: Positive for hearing loss. Negative for congestion, ear discharge and ear pain.   Eyes: Negative for pain, discharge and redness.       Low vision Dry eyes  Respiratory: Negative for cough, shortness of breath, wheezing and stridor.        Occasional hacking cough, non productive, no O2 desat, afebrile.  Cardiovascular: Negative for chest pain, palpitations and leg swelling.  Gastrointestinal: Negative for abdominal pain, constipation, nausea and vomiting.  Endocrine:       Hx of abnormal TSH  Genitourinary: Positive for frequency. Negative for dysuria and urgency.  Musculoskeletal: Positive for arthralgias and gait problem. Negative for back pain, myalgias and neck pain.       Left hip/leg pain.   Skin: Negative for rash.  Neurological: Negative for dizziness, tremors, seizures, weakness  and headaches.       Severre dementia  Hematological: Negative.   Psychiatric/Behavioral: Positive for confusion and decreased concentration. Negative for hallucinations and suicidal ideas. The patient is nervous/anxious.     Immunization History  Administered Date(s) Administered  . Influenza-Unspecified 06/06/2013, 04/21/2015, 05/17/2016  . PPD Test 07/23/2011  . Pneumococcal Polysaccharide-23 03/14/2009  . Td 03/14/2009  . Zoster 06/29/2006   Pertinent  Health Maintenance Due  Topic Date Due  . OPHTHALMOLOGY EXAM  07/28/1927  . DEXA SCAN  07/27/1982  . PNA vac Low Risk Adult (2 of 2 - PCV13) 03/14/2010  . INFLUENZA VACCINE  03/01/2017  . HEMOGLOBIN A1C  06/08/2017  . FOOT EXAM  12/05/2017  . URINE MICROALBUMIN  12/06/2017   Fall Risk  01/26/2015 03/06/2014  Falls in the past year? Yes Yes  Number falls in past yr: 1 1  Injury with Fall? Yes Yes  Risk Factor Category  - High Fall Risk  Risk for fall due to : History of fall(s);Impaired balance/gait;Impaired mobility -  Follow up Falls evaluation completed -   Functional Status Survey:    Vitals:   01/03/17 1412  BP: 126/60  Pulse: 62  Resp: 19  Temp: 97 F (36.1 C)  Weight: 97 lb 4.8 oz (44.1 kg)  Height: 5\' 2"  (1.575 m)   Body mass index is 17.8 kg/m. Physical Exam  Constitutional:  Extremely thin  HENT:  Head: Normocephalic and atraumatic.  Right Ear: External ear normal.  Left Ear: External ear normal.  Nose: Nose normal.  Mouth/Throat: Oropharynx is clear and moist. No oropharyngeal exudate.  Eyes: Conjunctivae and EOM are normal. Pupils are equal, round, and reactive to light. Right eye exhibits no discharge. Left eye exhibits no discharge. No scleral icterus.  Dry eyes. Legally blind.  Neck: Normal range of motion. Neck supple. No JVD present. No thyromegaly present.  Cardiovascular: Normal rate.   Murmur heard. Pacemaker. Systolic ejection murmur 3/6  Pulmonary/Chest: Effort normal and breath sounds  normal. No respiratory distress. She has no wheezes. She has no rales. She exhibits no tenderness.  Abdominal: Soft. Bowel sounds are normal. She exhibits no distension. There is no tenderness. There is no rebound.  Musculoskeletal: Normal range of motion. She exhibits tenderness. She exhibits no edema.  Left hip and leg pain  Lymphadenopathy:    She has no cervical adenopathy.  Neurological: She is alert. She has normal reflexes.  No cranial nerve deficit. She exhibits normal muscle tone. Coordination normal.  Skin: Skin is warm and dry. No rash noted. She is not diaphoretic. No erythema. No pallor.  Psychiatric: Thought content normal. Her mood appears not anxious. Her affect is not angry, not blunt, not labile and not inappropriate. Her speech is not rapid and/or pressured, not delayed and not slurred. She is slowed. She is not agitated, not aggressive, not hyperactive, not withdrawn, not actively hallucinating and not combative. Thought content is not paranoid and not delusional. Cognition and memory are impaired. She expresses impulsivity and inappropriate judgment. She exhibits a depressed mood. She exhibits abnormal recent memory.  Confusion, HOH, very low vision. Guards against examination. She is attentive.    Labs reviewed:  Recent Labs  12/06/16  NA 142  K 3.9  BUN 24*  CREATININE 0.9    Recent Labs  12/06/16  AST 21  ALT 11  ALKPHOS 70   No results for input(s): WBC, NEUTROABS, HGB, HCT, MCV, PLT in the last 8760 hours. Lab Results  Component Value Date   TSH 3.55 01/27/2015   Lab Results  Component Value Date   HGBA1C 5.2 12/06/2016   Lab Results  Component Value Date   CHOL 140 12/20/2012   HDL 38 12/20/2012   LDLCALC 57 12/20/2012   TRIG 227 (A) 12/20/2012   CHOLHDL 5.0 12/22/2007    Significant Diagnostic Results in last 30 days:  No results found.  Assessment/Plan Essential hypertension Normalized, not on meds  ATRIAL FIBRILLATION Heart rate is in  control, not on meds.   Constipation Stable, continue Senokot S II daily, Dulcolax prn  Hypothyroidism Off meds, TSH 2.52 12/07/16, goal of care is comfort measures.   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   Anxiety and depression Her mood is managed with Lexapro 10mg , Lorazepam 1mg  bid and q2h prn.     Family/ staff Communication: SNF  Labs/tests ordered:  none

## 2017-01-03 NOTE — Assessment & Plan Note (Addendum)
Off meds, TSH 2.52 12/07/16, goal of care is comfort measures.

## 2017-01-03 NOTE — Assessment & Plan Note (Signed)
Heart rate is in control, not on meds.

## 2017-01-03 NOTE — Assessment & Plan Note (Signed)
Normalized, not on meds

## 2017-01-03 NOTE — Assessment & Plan Note (Signed)
Total care of her ADLs

## 2017-01-03 NOTE — Assessment & Plan Note (Signed)
Diet controlled, last Hgb a1c 5.2

## 2017-01-03 NOTE — Assessment & Plan Note (Signed)
Her mood is managed with Lexapro 10mg , Lorazepam 1mg  bid and q2h prn.

## 2017-01-03 NOTE — Assessment & Plan Note (Signed)
Stable, continue Senokot S II daily, Dulcolax prn

## 2017-01-03 NOTE — Assessment & Plan Note (Signed)
Morphine, Lorazepam, Atropine available to her for comfort measures.

## 2017-01-04 DIAGNOSIS — I495 Sick sinus syndrome: Secondary | ICD-10-CM | POA: Diagnosis not present

## 2017-01-04 DIAGNOSIS — E46 Unspecified protein-calorie malnutrition: Secondary | ICD-10-CM | POA: Diagnosis not present

## 2017-01-04 DIAGNOSIS — R634 Abnormal weight loss: Secondary | ICD-10-CM | POA: Diagnosis not present

## 2017-01-04 DIAGNOSIS — F0151 Vascular dementia with behavioral disturbance: Secondary | ICD-10-CM | POA: Diagnosis not present

## 2017-01-04 DIAGNOSIS — R630 Anorexia: Secondary | ICD-10-CM | POA: Diagnosis not present

## 2017-01-04 DIAGNOSIS — I4891 Unspecified atrial fibrillation: Secondary | ICD-10-CM | POA: Diagnosis not present

## 2017-01-05 DIAGNOSIS — R630 Anorexia: Secondary | ICD-10-CM | POA: Diagnosis not present

## 2017-01-05 DIAGNOSIS — R634 Abnormal weight loss: Secondary | ICD-10-CM | POA: Diagnosis not present

## 2017-01-05 DIAGNOSIS — I4891 Unspecified atrial fibrillation: Secondary | ICD-10-CM | POA: Diagnosis not present

## 2017-01-05 DIAGNOSIS — I495 Sick sinus syndrome: Secondary | ICD-10-CM | POA: Diagnosis not present

## 2017-01-05 DIAGNOSIS — F0151 Vascular dementia with behavioral disturbance: Secondary | ICD-10-CM | POA: Diagnosis not present

## 2017-01-05 DIAGNOSIS — E46 Unspecified protein-calorie malnutrition: Secondary | ICD-10-CM | POA: Diagnosis not present

## 2017-01-06 DIAGNOSIS — I4891 Unspecified atrial fibrillation: Secondary | ICD-10-CM | POA: Diagnosis not present

## 2017-01-06 DIAGNOSIS — R630 Anorexia: Secondary | ICD-10-CM | POA: Diagnosis not present

## 2017-01-06 DIAGNOSIS — E46 Unspecified protein-calorie malnutrition: Secondary | ICD-10-CM | POA: Diagnosis not present

## 2017-01-06 DIAGNOSIS — R634 Abnormal weight loss: Secondary | ICD-10-CM | POA: Diagnosis not present

## 2017-01-06 DIAGNOSIS — F0151 Vascular dementia with behavioral disturbance: Secondary | ICD-10-CM | POA: Diagnosis not present

## 2017-01-06 DIAGNOSIS — I495 Sick sinus syndrome: Secondary | ICD-10-CM | POA: Diagnosis not present

## 2017-01-09 DIAGNOSIS — R630 Anorexia: Secondary | ICD-10-CM | POA: Diagnosis not present

## 2017-01-09 DIAGNOSIS — I495 Sick sinus syndrome: Secondary | ICD-10-CM | POA: Diagnosis not present

## 2017-01-09 DIAGNOSIS — F0151 Vascular dementia with behavioral disturbance: Secondary | ICD-10-CM | POA: Diagnosis not present

## 2017-01-09 DIAGNOSIS — R634 Abnormal weight loss: Secondary | ICD-10-CM | POA: Diagnosis not present

## 2017-01-09 DIAGNOSIS — E46 Unspecified protein-calorie malnutrition: Secondary | ICD-10-CM | POA: Diagnosis not present

## 2017-01-09 DIAGNOSIS — I4891 Unspecified atrial fibrillation: Secondary | ICD-10-CM | POA: Diagnosis not present

## 2017-01-10 DIAGNOSIS — I4891 Unspecified atrial fibrillation: Secondary | ICD-10-CM | POA: Diagnosis not present

## 2017-01-10 DIAGNOSIS — R630 Anorexia: Secondary | ICD-10-CM | POA: Diagnosis not present

## 2017-01-10 DIAGNOSIS — R634 Abnormal weight loss: Secondary | ICD-10-CM | POA: Diagnosis not present

## 2017-01-10 DIAGNOSIS — I495 Sick sinus syndrome: Secondary | ICD-10-CM | POA: Diagnosis not present

## 2017-01-10 DIAGNOSIS — E46 Unspecified protein-calorie malnutrition: Secondary | ICD-10-CM | POA: Diagnosis not present

## 2017-01-10 DIAGNOSIS — F0151 Vascular dementia with behavioral disturbance: Secondary | ICD-10-CM | POA: Diagnosis not present

## 2017-01-11 DIAGNOSIS — R630 Anorexia: Secondary | ICD-10-CM | POA: Diagnosis not present

## 2017-01-11 DIAGNOSIS — F0151 Vascular dementia with behavioral disturbance: Secondary | ICD-10-CM | POA: Diagnosis not present

## 2017-01-11 DIAGNOSIS — E46 Unspecified protein-calorie malnutrition: Secondary | ICD-10-CM | POA: Diagnosis not present

## 2017-01-11 DIAGNOSIS — I495 Sick sinus syndrome: Secondary | ICD-10-CM | POA: Diagnosis not present

## 2017-01-11 DIAGNOSIS — I4891 Unspecified atrial fibrillation: Secondary | ICD-10-CM | POA: Diagnosis not present

## 2017-01-11 DIAGNOSIS — R634 Abnormal weight loss: Secondary | ICD-10-CM | POA: Diagnosis not present

## 2017-01-12 DIAGNOSIS — F0151 Vascular dementia with behavioral disturbance: Secondary | ICD-10-CM | POA: Diagnosis not present

## 2017-01-12 DIAGNOSIS — R630 Anorexia: Secondary | ICD-10-CM | POA: Diagnosis not present

## 2017-01-12 DIAGNOSIS — R634 Abnormal weight loss: Secondary | ICD-10-CM | POA: Diagnosis not present

## 2017-01-12 DIAGNOSIS — E46 Unspecified protein-calorie malnutrition: Secondary | ICD-10-CM | POA: Diagnosis not present

## 2017-01-12 DIAGNOSIS — I4891 Unspecified atrial fibrillation: Secondary | ICD-10-CM | POA: Diagnosis not present

## 2017-01-12 DIAGNOSIS — I495 Sick sinus syndrome: Secondary | ICD-10-CM | POA: Diagnosis not present

## 2017-01-13 DIAGNOSIS — I495 Sick sinus syndrome: Secondary | ICD-10-CM | POA: Diagnosis not present

## 2017-01-13 DIAGNOSIS — E46 Unspecified protein-calorie malnutrition: Secondary | ICD-10-CM | POA: Diagnosis not present

## 2017-01-13 DIAGNOSIS — I4891 Unspecified atrial fibrillation: Secondary | ICD-10-CM | POA: Diagnosis not present

## 2017-01-13 DIAGNOSIS — R634 Abnormal weight loss: Secondary | ICD-10-CM | POA: Diagnosis not present

## 2017-01-13 DIAGNOSIS — R630 Anorexia: Secondary | ICD-10-CM | POA: Diagnosis not present

## 2017-01-13 DIAGNOSIS — F0151 Vascular dementia with behavioral disturbance: Secondary | ICD-10-CM | POA: Diagnosis not present

## 2017-01-16 DIAGNOSIS — R630 Anorexia: Secondary | ICD-10-CM | POA: Diagnosis not present

## 2017-01-16 DIAGNOSIS — E46 Unspecified protein-calorie malnutrition: Secondary | ICD-10-CM | POA: Diagnosis not present

## 2017-01-16 DIAGNOSIS — R634 Abnormal weight loss: Secondary | ICD-10-CM | POA: Diagnosis not present

## 2017-01-16 DIAGNOSIS — F0151 Vascular dementia with behavioral disturbance: Secondary | ICD-10-CM | POA: Diagnosis not present

## 2017-01-16 DIAGNOSIS — I4891 Unspecified atrial fibrillation: Secondary | ICD-10-CM | POA: Diagnosis not present

## 2017-01-16 DIAGNOSIS — I495 Sick sinus syndrome: Secondary | ICD-10-CM | POA: Diagnosis not present

## 2017-01-17 DIAGNOSIS — E46 Unspecified protein-calorie malnutrition: Secondary | ICD-10-CM | POA: Diagnosis not present

## 2017-01-17 DIAGNOSIS — F0151 Vascular dementia with behavioral disturbance: Secondary | ICD-10-CM | POA: Diagnosis not present

## 2017-01-17 DIAGNOSIS — R634 Abnormal weight loss: Secondary | ICD-10-CM | POA: Diagnosis not present

## 2017-01-17 DIAGNOSIS — I4891 Unspecified atrial fibrillation: Secondary | ICD-10-CM | POA: Diagnosis not present

## 2017-01-17 DIAGNOSIS — I495 Sick sinus syndrome: Secondary | ICD-10-CM | POA: Diagnosis not present

## 2017-01-17 DIAGNOSIS — R630 Anorexia: Secondary | ICD-10-CM | POA: Diagnosis not present

## 2017-01-18 DIAGNOSIS — R630 Anorexia: Secondary | ICD-10-CM | POA: Diagnosis not present

## 2017-01-18 DIAGNOSIS — R634 Abnormal weight loss: Secondary | ICD-10-CM | POA: Diagnosis not present

## 2017-01-18 DIAGNOSIS — I495 Sick sinus syndrome: Secondary | ICD-10-CM | POA: Diagnosis not present

## 2017-01-18 DIAGNOSIS — I4891 Unspecified atrial fibrillation: Secondary | ICD-10-CM | POA: Diagnosis not present

## 2017-01-18 DIAGNOSIS — F0151 Vascular dementia with behavioral disturbance: Secondary | ICD-10-CM | POA: Diagnosis not present

## 2017-01-18 DIAGNOSIS — E46 Unspecified protein-calorie malnutrition: Secondary | ICD-10-CM | POA: Diagnosis not present

## 2017-01-19 DIAGNOSIS — I495 Sick sinus syndrome: Secondary | ICD-10-CM | POA: Diagnosis not present

## 2017-01-19 DIAGNOSIS — F0151 Vascular dementia with behavioral disturbance: Secondary | ICD-10-CM | POA: Diagnosis not present

## 2017-01-19 DIAGNOSIS — E46 Unspecified protein-calorie malnutrition: Secondary | ICD-10-CM | POA: Diagnosis not present

## 2017-01-19 DIAGNOSIS — R634 Abnormal weight loss: Secondary | ICD-10-CM | POA: Diagnosis not present

## 2017-01-19 DIAGNOSIS — I4891 Unspecified atrial fibrillation: Secondary | ICD-10-CM | POA: Diagnosis not present

## 2017-01-19 DIAGNOSIS — R630 Anorexia: Secondary | ICD-10-CM | POA: Diagnosis not present

## 2017-01-20 DIAGNOSIS — E46 Unspecified protein-calorie malnutrition: Secondary | ICD-10-CM | POA: Diagnosis not present

## 2017-01-20 DIAGNOSIS — R634 Abnormal weight loss: Secondary | ICD-10-CM | POA: Diagnosis not present

## 2017-01-20 DIAGNOSIS — R630 Anorexia: Secondary | ICD-10-CM | POA: Diagnosis not present

## 2017-01-20 DIAGNOSIS — I4891 Unspecified atrial fibrillation: Secondary | ICD-10-CM | POA: Diagnosis not present

## 2017-01-20 DIAGNOSIS — F0151 Vascular dementia with behavioral disturbance: Secondary | ICD-10-CM | POA: Diagnosis not present

## 2017-01-20 DIAGNOSIS — I495 Sick sinus syndrome: Secondary | ICD-10-CM | POA: Diagnosis not present

## 2017-01-21 DIAGNOSIS — I495 Sick sinus syndrome: Secondary | ICD-10-CM | POA: Diagnosis not present

## 2017-01-21 DIAGNOSIS — E46 Unspecified protein-calorie malnutrition: Secondary | ICD-10-CM | POA: Diagnosis not present

## 2017-01-21 DIAGNOSIS — I4891 Unspecified atrial fibrillation: Secondary | ICD-10-CM | POA: Diagnosis not present

## 2017-01-21 DIAGNOSIS — R634 Abnormal weight loss: Secondary | ICD-10-CM | POA: Diagnosis not present

## 2017-01-21 DIAGNOSIS — R630 Anorexia: Secondary | ICD-10-CM | POA: Diagnosis not present

## 2017-01-21 DIAGNOSIS — F0151 Vascular dementia with behavioral disturbance: Secondary | ICD-10-CM | POA: Diagnosis not present

## 2017-01-23 DIAGNOSIS — I4891 Unspecified atrial fibrillation: Secondary | ICD-10-CM | POA: Diagnosis not present

## 2017-01-23 DIAGNOSIS — I495 Sick sinus syndrome: Secondary | ICD-10-CM | POA: Diagnosis not present

## 2017-01-23 DIAGNOSIS — F0151 Vascular dementia with behavioral disturbance: Secondary | ICD-10-CM | POA: Diagnosis not present

## 2017-01-23 DIAGNOSIS — R630 Anorexia: Secondary | ICD-10-CM | POA: Diagnosis not present

## 2017-01-23 DIAGNOSIS — E46 Unspecified protein-calorie malnutrition: Secondary | ICD-10-CM | POA: Diagnosis not present

## 2017-01-23 DIAGNOSIS — R634 Abnormal weight loss: Secondary | ICD-10-CM | POA: Diagnosis not present

## 2017-01-24 DIAGNOSIS — F0151 Vascular dementia with behavioral disturbance: Secondary | ICD-10-CM | POA: Diagnosis not present

## 2017-01-24 DIAGNOSIS — E46 Unspecified protein-calorie malnutrition: Secondary | ICD-10-CM | POA: Diagnosis not present

## 2017-01-24 DIAGNOSIS — R634 Abnormal weight loss: Secondary | ICD-10-CM | POA: Diagnosis not present

## 2017-01-24 DIAGNOSIS — I495 Sick sinus syndrome: Secondary | ICD-10-CM | POA: Diagnosis not present

## 2017-01-24 DIAGNOSIS — I4891 Unspecified atrial fibrillation: Secondary | ICD-10-CM | POA: Diagnosis not present

## 2017-01-24 DIAGNOSIS — R630 Anorexia: Secondary | ICD-10-CM | POA: Diagnosis not present

## 2017-01-25 DIAGNOSIS — R630 Anorexia: Secondary | ICD-10-CM | POA: Diagnosis not present

## 2017-01-25 DIAGNOSIS — I495 Sick sinus syndrome: Secondary | ICD-10-CM | POA: Diagnosis not present

## 2017-01-25 DIAGNOSIS — E46 Unspecified protein-calorie malnutrition: Secondary | ICD-10-CM | POA: Diagnosis not present

## 2017-01-25 DIAGNOSIS — R634 Abnormal weight loss: Secondary | ICD-10-CM | POA: Diagnosis not present

## 2017-01-25 DIAGNOSIS — F0151 Vascular dementia with behavioral disturbance: Secondary | ICD-10-CM | POA: Diagnosis not present

## 2017-01-25 DIAGNOSIS — I4891 Unspecified atrial fibrillation: Secondary | ICD-10-CM | POA: Diagnosis not present

## 2017-01-26 DIAGNOSIS — R630 Anorexia: Secondary | ICD-10-CM | POA: Diagnosis not present

## 2017-01-26 DIAGNOSIS — I4891 Unspecified atrial fibrillation: Secondary | ICD-10-CM | POA: Diagnosis not present

## 2017-01-26 DIAGNOSIS — R634 Abnormal weight loss: Secondary | ICD-10-CM | POA: Diagnosis not present

## 2017-01-26 DIAGNOSIS — I495 Sick sinus syndrome: Secondary | ICD-10-CM | POA: Diagnosis not present

## 2017-01-26 DIAGNOSIS — E46 Unspecified protein-calorie malnutrition: Secondary | ICD-10-CM | POA: Diagnosis not present

## 2017-01-26 DIAGNOSIS — F0151 Vascular dementia with behavioral disturbance: Secondary | ICD-10-CM | POA: Diagnosis not present

## 2017-01-27 DIAGNOSIS — I4891 Unspecified atrial fibrillation: Secondary | ICD-10-CM | POA: Diagnosis not present

## 2017-01-27 DIAGNOSIS — I495 Sick sinus syndrome: Secondary | ICD-10-CM | POA: Diagnosis not present

## 2017-01-27 DIAGNOSIS — R634 Abnormal weight loss: Secondary | ICD-10-CM | POA: Diagnosis not present

## 2017-01-27 DIAGNOSIS — F0151 Vascular dementia with behavioral disturbance: Secondary | ICD-10-CM | POA: Diagnosis not present

## 2017-01-27 DIAGNOSIS — R630 Anorexia: Secondary | ICD-10-CM | POA: Diagnosis not present

## 2017-01-27 DIAGNOSIS — E46 Unspecified protein-calorie malnutrition: Secondary | ICD-10-CM | POA: Diagnosis not present

## 2017-01-29 DIAGNOSIS — R634 Abnormal weight loss: Secondary | ICD-10-CM | POA: Diagnosis not present

## 2017-01-29 DIAGNOSIS — M84359S Stress fracture, hip, unspecified, sequela: Secondary | ICD-10-CM | POA: Diagnosis not present

## 2017-01-29 DIAGNOSIS — I10 Essential (primary) hypertension: Secondary | ICD-10-CM | POA: Diagnosis not present

## 2017-01-29 DIAGNOSIS — R630 Anorexia: Secondary | ICD-10-CM | POA: Diagnosis not present

## 2017-01-29 DIAGNOSIS — E785 Hyperlipidemia, unspecified: Secondary | ICD-10-CM | POA: Diagnosis not present

## 2017-01-29 DIAGNOSIS — H3530 Unspecified macular degeneration: Secondary | ICD-10-CM | POA: Diagnosis not present

## 2017-01-29 DIAGNOSIS — I495 Sick sinus syndrome: Secondary | ICD-10-CM | POA: Diagnosis not present

## 2017-01-29 DIAGNOSIS — E46 Unspecified protein-calorie malnutrition: Secondary | ICD-10-CM | POA: Diagnosis not present

## 2017-01-29 DIAGNOSIS — I4891 Unspecified atrial fibrillation: Secondary | ICD-10-CM | POA: Diagnosis not present

## 2017-01-29 DIAGNOSIS — E039 Hypothyroidism, unspecified: Secondary | ICD-10-CM | POA: Diagnosis not present

## 2017-01-29 DIAGNOSIS — F339 Major depressive disorder, recurrent, unspecified: Secondary | ICD-10-CM | POA: Diagnosis not present

## 2017-01-29 DIAGNOSIS — E1159 Type 2 diabetes mellitus with other circulatory complications: Secondary | ICD-10-CM | POA: Diagnosis not present

## 2017-01-29 DIAGNOSIS — F0151 Vascular dementia with behavioral disturbance: Secondary | ICD-10-CM | POA: Diagnosis not present

## 2017-01-30 DIAGNOSIS — F0151 Vascular dementia with behavioral disturbance: Secondary | ICD-10-CM | POA: Diagnosis not present

## 2017-01-30 DIAGNOSIS — E46 Unspecified protein-calorie malnutrition: Secondary | ICD-10-CM | POA: Diagnosis not present

## 2017-01-30 DIAGNOSIS — R630 Anorexia: Secondary | ICD-10-CM | POA: Diagnosis not present

## 2017-01-30 DIAGNOSIS — I495 Sick sinus syndrome: Secondary | ICD-10-CM | POA: Diagnosis not present

## 2017-01-30 DIAGNOSIS — I4891 Unspecified atrial fibrillation: Secondary | ICD-10-CM | POA: Diagnosis not present

## 2017-01-30 DIAGNOSIS — R634 Abnormal weight loss: Secondary | ICD-10-CM | POA: Diagnosis not present

## 2017-01-31 DIAGNOSIS — I4891 Unspecified atrial fibrillation: Secondary | ICD-10-CM | POA: Diagnosis not present

## 2017-01-31 DIAGNOSIS — F0151 Vascular dementia with behavioral disturbance: Secondary | ICD-10-CM | POA: Diagnosis not present

## 2017-01-31 DIAGNOSIS — I495 Sick sinus syndrome: Secondary | ICD-10-CM | POA: Diagnosis not present

## 2017-01-31 DIAGNOSIS — R634 Abnormal weight loss: Secondary | ICD-10-CM | POA: Diagnosis not present

## 2017-01-31 DIAGNOSIS — R630 Anorexia: Secondary | ICD-10-CM | POA: Diagnosis not present

## 2017-01-31 DIAGNOSIS — E46 Unspecified protein-calorie malnutrition: Secondary | ICD-10-CM | POA: Diagnosis not present

## 2017-02-02 DIAGNOSIS — I495 Sick sinus syndrome: Secondary | ICD-10-CM | POA: Diagnosis not present

## 2017-02-02 DIAGNOSIS — F0151 Vascular dementia with behavioral disturbance: Secondary | ICD-10-CM | POA: Diagnosis not present

## 2017-02-02 DIAGNOSIS — I4891 Unspecified atrial fibrillation: Secondary | ICD-10-CM | POA: Diagnosis not present

## 2017-02-02 DIAGNOSIS — R630 Anorexia: Secondary | ICD-10-CM | POA: Diagnosis not present

## 2017-02-02 DIAGNOSIS — R634 Abnormal weight loss: Secondary | ICD-10-CM | POA: Diagnosis not present

## 2017-02-02 DIAGNOSIS — E46 Unspecified protein-calorie malnutrition: Secondary | ICD-10-CM | POA: Diagnosis not present

## 2017-02-03 DIAGNOSIS — I495 Sick sinus syndrome: Secondary | ICD-10-CM | POA: Diagnosis not present

## 2017-02-03 DIAGNOSIS — F0151 Vascular dementia with behavioral disturbance: Secondary | ICD-10-CM | POA: Diagnosis not present

## 2017-02-03 DIAGNOSIS — R630 Anorexia: Secondary | ICD-10-CM | POA: Diagnosis not present

## 2017-02-03 DIAGNOSIS — I4891 Unspecified atrial fibrillation: Secondary | ICD-10-CM | POA: Diagnosis not present

## 2017-02-03 DIAGNOSIS — R634 Abnormal weight loss: Secondary | ICD-10-CM | POA: Diagnosis not present

## 2017-02-03 DIAGNOSIS — E46 Unspecified protein-calorie malnutrition: Secondary | ICD-10-CM | POA: Diagnosis not present

## 2017-02-06 DIAGNOSIS — R634 Abnormal weight loss: Secondary | ICD-10-CM | POA: Diagnosis not present

## 2017-02-06 DIAGNOSIS — F0151 Vascular dementia with behavioral disturbance: Secondary | ICD-10-CM | POA: Diagnosis not present

## 2017-02-06 DIAGNOSIS — R630 Anorexia: Secondary | ICD-10-CM | POA: Diagnosis not present

## 2017-02-06 DIAGNOSIS — E46 Unspecified protein-calorie malnutrition: Secondary | ICD-10-CM | POA: Diagnosis not present

## 2017-02-06 DIAGNOSIS — I4891 Unspecified atrial fibrillation: Secondary | ICD-10-CM | POA: Diagnosis not present

## 2017-02-06 DIAGNOSIS — I495 Sick sinus syndrome: Secondary | ICD-10-CM | POA: Diagnosis not present

## 2017-02-07 ENCOUNTER — Non-Acute Institutional Stay (SKILLED_NURSING_FACILITY): Payer: Medicare Other | Admitting: Internal Medicine

## 2017-02-07 ENCOUNTER — Encounter: Payer: Self-pay | Admitting: Internal Medicine

## 2017-02-07 DIAGNOSIS — E43 Unspecified severe protein-calorie malnutrition: Secondary | ICD-10-CM | POA: Diagnosis not present

## 2017-02-07 DIAGNOSIS — E46 Unspecified protein-calorie malnutrition: Secondary | ICD-10-CM | POA: Diagnosis not present

## 2017-02-07 DIAGNOSIS — F0281 Dementia in other diseases classified elsewhere with behavioral disturbance: Secondary | ICD-10-CM | POA: Diagnosis not present

## 2017-02-07 DIAGNOSIS — G309 Alzheimer's disease, unspecified: Secondary | ICD-10-CM | POA: Diagnosis not present

## 2017-02-07 DIAGNOSIS — I4891 Unspecified atrial fibrillation: Secondary | ICD-10-CM | POA: Diagnosis not present

## 2017-02-07 DIAGNOSIS — K5909 Other constipation: Secondary | ICD-10-CM | POA: Diagnosis not present

## 2017-02-07 DIAGNOSIS — R634 Abnormal weight loss: Secondary | ICD-10-CM | POA: Diagnosis not present

## 2017-02-07 DIAGNOSIS — I495 Sick sinus syndrome: Secondary | ICD-10-CM | POA: Diagnosis not present

## 2017-02-07 DIAGNOSIS — I10 Essential (primary) hypertension: Secondary | ICD-10-CM

## 2017-02-07 DIAGNOSIS — R1312 Dysphagia, oropharyngeal phase: Secondary | ICD-10-CM | POA: Diagnosis not present

## 2017-02-07 DIAGNOSIS — F0151 Vascular dementia with behavioral disturbance: Secondary | ICD-10-CM | POA: Diagnosis not present

## 2017-02-07 DIAGNOSIS — R630 Anorexia: Secondary | ICD-10-CM | POA: Diagnosis not present

## 2017-02-07 NOTE — Progress Notes (Signed)
[facility]  [facility] Room Number: 30 Place of Service:  SNF (31) Provider:  Oneal GroutMahima Ziyanna Tolin MD  Oneal GroutPandey, Audreyanna Butkiewicz, MD  Patient Care Team: Oneal GroutPandey, Rumi Taras, MD as PCP - General (Internal Medicine) Mast, Man X, NP as Nurse Practitioner (Nurse Practitioner) [facility] [place], [place] Lowella BandyM Jr., MD as Consulting Physician (Urology) Rachael FeeJacobs, Daniel P, MD as Consulting Physician (Gastroenterology) Salvatore MarvelWainer, Robert, MD as Consulting Physician (Orthopedic Surgery) Venancio PoissonLomax, Laura, MD as Consulting Physician (Dermatology) Sheral ApleyMurphy, Timothy D, MD as Attending Physician (Orthopedic Surgery)  Extended Emergency Contact Information Primary Emergency Contact: Cook,[name] Address: [address]          [place], [place] [address] [place] Home Phone: [phone] Work Phone: [phone] Mobile Phone: [phone] Relation: Son Secondary Emergency Contact: [place],[name] Address: [address]          [place], [place] [address] [place] Home Phone: [phone] Relation: Relative  Code Status:  DNR Goals of care: Advanced Directive information Advanced Directives 01/03/2017  Does Patient Have a Medical Advance Directive? Yes  Type of Estate agentAdvance Directive Healthcare Power of [place];Living will;Out of facility DNR (pink MOST or yellow form)  Does patient want to make changes to medical advance directive? No - Patient declined  Copy of Healthcare Power of Attorney in Chart? Yes  Pre-existing out of facility DNR order (yellow form or pink MOST form) Yellow form placed in chart (order not valid for inpatient use)     Chief Complaint  Patient presents with  . Medical Management of Chronic Issues    Routine Visit     HPI:  Pt is a 83 y.o. female seen today for medical management of chronic diseases.  She is in her bed. She does not participate in HPI and ROS. She is under total care and is being followed by hospice service. She does not participate in HPI  and ROS. No fall reported. No pressure ulcer reported. Her mood and behavior has been stable with benzodiazepine. She takes her medications crushed and is on dysphagia diet. She needs 2 person assistance with transfer and is out of bed to geri chair daily.    Past Medical History:  Diagnosis Date  . Abnormality of gait 09/08/2003  . AF (atrial fibrillation) (HCC)   . Anxiety state, unspecified 08/19/2010  . Aortic stenosis   . Atrial fibrillation (HCC) 07/14/2009   Qualifier: Diagnosis of  By: Graciela HusbandsKlein, MD, Ruthann CancerFACC, Ty HiltsSteven Cochran   . Bradycardia   . Cardiac pacemaker in situ 09/12/2004  . Closed fracture of lumbar vertebra without mention of spinal cord injury 111/17/202010  . Closed fracture of unspecified trochanteric section of femur 05/29/2011  . Constipation 03/14/2014  . Dementia 2013   04/03/14 MMSE 22/30   . Depression 04/10/2014  . Diffuse cystic mastopathy 12/23/2009  . Disturbance of salivary secretion [id number]  . Edema 08/18/2003  . Fall 12/19/2012   07/22/14 fall VS 144/76, 60, 20, 97.1. No apparent injury   . Flatulence, eructation, and gas pain 12/03/2009  . Fracture of greater trochanter of left femur (HCC) 03/08/14  . FTT (failure to thrive) in adult 12/15/2014  . Hearing loss   . Hemorrhoids   . Hip fracture (HCC)   . HTN (hypertension)   . Hypothyroidism 12/19/2007   Qualifier: Diagnosis of  By: Melvyn NethLewis CMA (AAMA), Patty  01/16/14 TSH 2.474 03/24/14 TSH 2.576    . 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 Loss of  weight 10/13/2014   Multiple factorials: dementia, depression, advanced age, possible AR of medications(metformin and Oxybutynin)-will increase Mirtazapine and continue supplement. Observe.    . Macular degeneration (senile) of retina, unspecified 12/12/2001  . Macular degeneration of both eyes 01/24/2013   Severe visual impairment. Nearly blind.   . Mitral valve prolapse   . Occlusion and stenosis of carotid artery without mention of  cerebral infarction 12/27/2007  . Other and unspecified hyperlipidemia   . Other malaise and fatigue 12/06/2007  . Pacemaker-dual  medtronic 09/07/2011  . Personal history of fall 01/29/2009  . Rectal bleeding   . Senile osteoporosis 12/13/2000  . Sinus node dysfunction (HCC)   . Syncope and collapse 12/27/2007  . Trochanteric fracture of left femur (HCC) 03/14/2014   CT scan did however reveal acute fracture in greater left trochanteric. Dr. Margarita Rana consulted. Recommended nonoperative management and weightbearing as tolerated and another 3 weeks of Lovenox for DVT prophylaxis.       . Type 2 diabetes mellitus with blindness, with macular edema, with severe nonproliferative retinopathy 12/19/2007   Qualifier: Diagnosis of  By: Melvyn Neth CMA (AAMA), Patty  01/09/14 Hgb A1c 6.5 01/16/14 Hgb A1c 6.5 03/24/14 Hgb 6.4 12/10/14 pharm: dc Metformin and CBG monitoring-failure to thrive and refusing to eat.     Marland Kitchen Unspecified hereditary and idiopathic peripheral neuropathy 10/28/202012  . Unspecified hypothyroidism 03/18/2010  . Unspecified urinary incontinence 02/22/2005  . Urinary tract infection, site not specified 04/10/2014  . Xerophthalmus 01/21/2015   Past Surgical History:  Procedure Laterality Date  . BREAST BIOPSY Left 1970   benign  . Left hip surgery  09/2005  . PACEMAKER INSERTION  2006   Medtronic In-Pulse  . Right hip surgery  08/2003  . TONSILLECTOMY    . 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 Codeine     unknown  . Ibuprofen     unknown  . Lisinopril     unknown  . Penicillins     unknown  . Sanctura [Trospium Chloride]     unknown    Outpatient Encounter Prescriptions as of 02/07/2017  Medication Sig  . acetaminophen (TYLENOL) 325 MG tablet Take 650 mg by mouth every 4 (four) hours as needed for mild pain or moderate pain.   Marland Kitchen atropine 1 % ophthalmic solution Place 4 drops on  tongue  as needed for excess secretions  . bisacodyl (DULCOLAX) 10 MG suppository Place 10 mg rectally daily as needed for moderate constipation.   Marland Kitchen escitalopram (LEXAPRO) 10 MG tablet Take 15 mg by mouth daily.   Marland Kitchen LORazepam (ATIVAN) 1 MG tablet Take 1 mg by mouth. Take one at 12:00 noon and  at 4 pm, for 60 days (01/16/17)  Take one tablet every 2 hours as needed for anxiety .  . morphine (ROXANOL) 20 MG/ML concentrated solution Give 0.51ml (5mg ) sublingual under tongue  every two hours as needed for pain  . sennosides-docusate sodium (SENOKOT-S) 8.6-50 MG tablet Take 2 tablets by mouth at bedtime. Reported on 12/11/2015   No facility-administered encounter medications on file as of 02/07/2017.     Review of Systems  Reason unable to perform ROS: with her advanced dementia.    Immunization History  Administered Date(s) Administered  . Influenza-Unspecified 06/06/2013, 04/21/2015, 05/17/2016  . PPD Test 07/23/2011  . Pneumococcal Polysaccharide-23 03/14/2009  . Td 03/14/2009  . Zoster 06/29/2006   Pertinent  Health Maintenance Due  Topic Date Due  . OPHTHALMOLOGY EXAM  07/28/1927  . DEXA SCAN  07/27/1982  . PNA vac Low Risk Adult (2 of 2 - PCV13) 03/14/2010  . INFLUENZA VACCINE  03/01/2017  . HEMOGLOBIN A1C  06/08/2017  . FOOT EXAM  12/05/2017  . URINE MICROALBUMIN  12/06/2017   Fall Risk  01/26/2015 03/06/2014  Falls in the past year? Yes Yes  Number falls in past yr: 1 1  Injury with Fall? Yes Yes  Risk Factor Category  - High Fall Risk  Risk for fall due to : History of fall(s);Impaired balance/gait;Impaired mobility -  Follow up Falls evaluation completed -   Functional Status Survey:    Vitals:   02/07/17 1426  BP: (!) 160/50  Pulse: 76  Resp: 18  Temp: 98 F (36.7 C)  TempSrc: Oral  Weight: 95 lb 11.2 oz (43.4 kg)  Height: 5\' 2"  (1.575 m)   Body mass index is 17.5 kg/m. Physical Exam  Constitutional: No distress.  Frail and thin built, elderly female  patient  HENT:  Head: Normocephalic and atraumatic.  Legally blind  Eyes: Pupils are equal, round, and reactive to light.  Neck: Normal range of motion. Neck supple.  Cardiovascular: Normal rate and regular rhythm.   Murmur heard. Pacemaker   Pulmonary/Chest: Effort normal and breath sounds normal.  Abdominal: Soft. Bowel sounds are normal.  Musculoskeletal: She exhibits no edema.  Can move all 4 extremities  Lymphadenopathy:    She has no cervical adenopathy.  Neurological:  Pleasantly confused  Skin: Skin is warm and dry. She is not diaphoretic.  geri sleeves to her arms and legs    Labs reviewed:  Recent Labs  12/06/16  NA 142  K 3.9  BUN 24*  CREATININE 0.9    Recent Labs  12/06/16  AST 21  ALT 11  ALKPHOS 70   No results for input(s): WBC, NEUTROABS, HGB, HCT, MCV, PLT in the last 8760 hours. Lab Results  Component Value Date   TSH 3.55 01/27/2015   Lab Results  Component Value Date   HGBA1C 5.2 12/06/2016   Lab Results  Component Value Date   CHOL 140 12/20/2012   HDL 38 12/20/2012   LDLCALC 57 12/20/2012   TRIG 227 (A) 12/20/2012   CHOLHDL 5.0 12/22/2007    Significant Diagnostic Results in last 30 days:  No results found.   Assessment/Plan  Chronic constipation Continue senokot s at bedtime and prn dulcolax suppository and monitor  Dementia with behavioral disturbance Advanced and decline anticipated. Continue scheduled lorazepam for anxiety. Continue escitalopram for depression.  Severe protein calorie malnutrition Assist with feeding, decline anticipated with advanced dementia  Dysphagia With cognitive impairment, continue pureed feed with nectar thick liquid, aspiration precautions  HTN 2 Elevated BP reading with SBP in 160s. Her being agitated during BP check could contribute to this. Rest of SBP 112-124. Monitor BP reading for now and if BP remains elevated, will need to consider antihypertensives    Family/ staff  Communication: reviewed care plan with patient and charge nurse.   Labs/tests ordered:  none   Oneal Grout, MD Internal Medicine [facility] [address] [place], [place] [address] Cell Phone (Monday-Friday 8 am - 5 pm): [phone] On Call: [phone] and follow prompts after 5 pm and on weekends Office Phone: [phone] Office Fax: [phone]

## 2017-02-08 DIAGNOSIS — I495 Sick sinus syndrome: Secondary | ICD-10-CM | POA: Diagnosis not present

## 2017-02-08 DIAGNOSIS — F0151 Vascular dementia with behavioral disturbance: Secondary | ICD-10-CM | POA: Diagnosis not present

## 2017-02-08 DIAGNOSIS — I4891 Unspecified atrial fibrillation: Secondary | ICD-10-CM | POA: Diagnosis not present

## 2017-02-08 DIAGNOSIS — E46 Unspecified protein-calorie malnutrition: Secondary | ICD-10-CM | POA: Diagnosis not present

## 2017-02-08 DIAGNOSIS — R630 Anorexia: Secondary | ICD-10-CM | POA: Diagnosis not present

## 2017-02-08 DIAGNOSIS — R634 Abnormal weight loss: Secondary | ICD-10-CM | POA: Diagnosis not present

## 2017-02-09 DIAGNOSIS — F0151 Vascular dementia with behavioral disturbance: Secondary | ICD-10-CM | POA: Diagnosis not present

## 2017-02-09 DIAGNOSIS — E46 Unspecified protein-calorie malnutrition: Secondary | ICD-10-CM | POA: Diagnosis not present

## 2017-02-09 DIAGNOSIS — I4891 Unspecified atrial fibrillation: Secondary | ICD-10-CM | POA: Diagnosis not present

## 2017-02-09 DIAGNOSIS — R630 Anorexia: Secondary | ICD-10-CM | POA: Diagnosis not present

## 2017-02-09 DIAGNOSIS — R634 Abnormal weight loss: Secondary | ICD-10-CM | POA: Diagnosis not present

## 2017-02-09 DIAGNOSIS — I495 Sick sinus syndrome: Secondary | ICD-10-CM | POA: Diagnosis not present

## 2017-02-10 DIAGNOSIS — F0151 Vascular dementia with behavioral disturbance: Secondary | ICD-10-CM | POA: Diagnosis not present

## 2017-02-10 DIAGNOSIS — I495 Sick sinus syndrome: Secondary | ICD-10-CM | POA: Diagnosis not present

## 2017-02-10 DIAGNOSIS — R634 Abnormal weight loss: Secondary | ICD-10-CM | POA: Diagnosis not present

## 2017-02-10 DIAGNOSIS — R630 Anorexia: Secondary | ICD-10-CM | POA: Diagnosis not present

## 2017-02-10 DIAGNOSIS — I4891 Unspecified atrial fibrillation: Secondary | ICD-10-CM | POA: Diagnosis not present

## 2017-02-10 DIAGNOSIS — E46 Unspecified protein-calorie malnutrition: Secondary | ICD-10-CM | POA: Diagnosis not present

## 2017-02-13 DIAGNOSIS — I495 Sick sinus syndrome: Secondary | ICD-10-CM | POA: Diagnosis not present

## 2017-02-13 DIAGNOSIS — I4891 Unspecified atrial fibrillation: Secondary | ICD-10-CM | POA: Diagnosis not present

## 2017-02-13 DIAGNOSIS — R630 Anorexia: Secondary | ICD-10-CM | POA: Diagnosis not present

## 2017-02-13 DIAGNOSIS — R634 Abnormal weight loss: Secondary | ICD-10-CM | POA: Diagnosis not present

## 2017-02-13 DIAGNOSIS — E46 Unspecified protein-calorie malnutrition: Secondary | ICD-10-CM | POA: Diagnosis not present

## 2017-02-13 DIAGNOSIS — F0151 Vascular dementia with behavioral disturbance: Secondary | ICD-10-CM | POA: Diagnosis not present

## 2017-02-14 DIAGNOSIS — F0151 Vascular dementia with behavioral disturbance: Secondary | ICD-10-CM | POA: Diagnosis not present

## 2017-02-14 DIAGNOSIS — I495 Sick sinus syndrome: Secondary | ICD-10-CM | POA: Diagnosis not present

## 2017-02-14 DIAGNOSIS — E46 Unspecified protein-calorie malnutrition: Secondary | ICD-10-CM | POA: Diagnosis not present

## 2017-02-14 DIAGNOSIS — R634 Abnormal weight loss: Secondary | ICD-10-CM | POA: Diagnosis not present

## 2017-02-14 DIAGNOSIS — R630 Anorexia: Secondary | ICD-10-CM | POA: Diagnosis not present

## 2017-02-14 DIAGNOSIS — I4891 Unspecified atrial fibrillation: Secondary | ICD-10-CM | POA: Diagnosis not present

## 2017-02-15 DIAGNOSIS — E46 Unspecified protein-calorie malnutrition: Secondary | ICD-10-CM | POA: Diagnosis not present

## 2017-02-15 DIAGNOSIS — F0151 Vascular dementia with behavioral disturbance: Secondary | ICD-10-CM | POA: Diagnosis not present

## 2017-02-15 DIAGNOSIS — I4891 Unspecified atrial fibrillation: Secondary | ICD-10-CM | POA: Diagnosis not present

## 2017-02-15 DIAGNOSIS — R634 Abnormal weight loss: Secondary | ICD-10-CM | POA: Diagnosis not present

## 2017-02-15 DIAGNOSIS — R630 Anorexia: Secondary | ICD-10-CM | POA: Diagnosis not present

## 2017-02-15 DIAGNOSIS — I495 Sick sinus syndrome: Secondary | ICD-10-CM | POA: Diagnosis not present

## 2017-02-16 DIAGNOSIS — R630 Anorexia: Secondary | ICD-10-CM | POA: Diagnosis not present

## 2017-02-16 DIAGNOSIS — I495 Sick sinus syndrome: Secondary | ICD-10-CM | POA: Diagnosis not present

## 2017-02-16 DIAGNOSIS — R634 Abnormal weight loss: Secondary | ICD-10-CM | POA: Diagnosis not present

## 2017-02-16 DIAGNOSIS — I4891 Unspecified atrial fibrillation: Secondary | ICD-10-CM | POA: Diagnosis not present

## 2017-02-16 DIAGNOSIS — E46 Unspecified protein-calorie malnutrition: Secondary | ICD-10-CM | POA: Diagnosis not present

## 2017-02-16 DIAGNOSIS — F0151 Vascular dementia with behavioral disturbance: Secondary | ICD-10-CM | POA: Diagnosis not present

## 2017-02-17 DIAGNOSIS — I495 Sick sinus syndrome: Secondary | ICD-10-CM | POA: Diagnosis not present

## 2017-02-17 DIAGNOSIS — R630 Anorexia: Secondary | ICD-10-CM | POA: Diagnosis not present

## 2017-02-17 DIAGNOSIS — R634 Abnormal weight loss: Secondary | ICD-10-CM | POA: Diagnosis not present

## 2017-02-17 DIAGNOSIS — E46 Unspecified protein-calorie malnutrition: Secondary | ICD-10-CM | POA: Diagnosis not present

## 2017-02-17 DIAGNOSIS — I4891 Unspecified atrial fibrillation: Secondary | ICD-10-CM | POA: Diagnosis not present

## 2017-02-17 DIAGNOSIS — F0151 Vascular dementia with behavioral disturbance: Secondary | ICD-10-CM | POA: Diagnosis not present

## 2017-02-20 DIAGNOSIS — E46 Unspecified protein-calorie malnutrition: Secondary | ICD-10-CM | POA: Diagnosis not present

## 2017-02-20 DIAGNOSIS — R630 Anorexia: Secondary | ICD-10-CM | POA: Diagnosis not present

## 2017-02-20 DIAGNOSIS — I495 Sick sinus syndrome: Secondary | ICD-10-CM | POA: Diagnosis not present

## 2017-02-20 DIAGNOSIS — I4891 Unspecified atrial fibrillation: Secondary | ICD-10-CM | POA: Diagnosis not present

## 2017-02-20 DIAGNOSIS — F0151 Vascular dementia with behavioral disturbance: Secondary | ICD-10-CM | POA: Diagnosis not present

## 2017-02-20 DIAGNOSIS — R634 Abnormal weight loss: Secondary | ICD-10-CM | POA: Diagnosis not present

## 2017-02-21 DIAGNOSIS — R634 Abnormal weight loss: Secondary | ICD-10-CM | POA: Diagnosis not present

## 2017-02-21 DIAGNOSIS — E46 Unspecified protein-calorie malnutrition: Secondary | ICD-10-CM | POA: Diagnosis not present

## 2017-02-21 DIAGNOSIS — I495 Sick sinus syndrome: Secondary | ICD-10-CM | POA: Diagnosis not present

## 2017-02-21 DIAGNOSIS — R630 Anorexia: Secondary | ICD-10-CM | POA: Diagnosis not present

## 2017-02-21 DIAGNOSIS — I4891 Unspecified atrial fibrillation: Secondary | ICD-10-CM | POA: Diagnosis not present

## 2017-02-21 DIAGNOSIS — F0151 Vascular dementia with behavioral disturbance: Secondary | ICD-10-CM | POA: Diagnosis not present

## 2017-02-22 DIAGNOSIS — F0151 Vascular dementia with behavioral disturbance: Secondary | ICD-10-CM | POA: Diagnosis not present

## 2017-02-22 DIAGNOSIS — R634 Abnormal weight loss: Secondary | ICD-10-CM | POA: Diagnosis not present

## 2017-02-22 DIAGNOSIS — E46 Unspecified protein-calorie malnutrition: Secondary | ICD-10-CM | POA: Diagnosis not present

## 2017-02-22 DIAGNOSIS — I495 Sick sinus syndrome: Secondary | ICD-10-CM | POA: Diagnosis not present

## 2017-02-22 DIAGNOSIS — R630 Anorexia: Secondary | ICD-10-CM | POA: Diagnosis not present

## 2017-02-22 DIAGNOSIS — I4891 Unspecified atrial fibrillation: Secondary | ICD-10-CM | POA: Diagnosis not present

## 2017-02-23 DIAGNOSIS — F0151 Vascular dementia with behavioral disturbance: Secondary | ICD-10-CM | POA: Diagnosis not present

## 2017-02-23 DIAGNOSIS — E46 Unspecified protein-calorie malnutrition: Secondary | ICD-10-CM | POA: Diagnosis not present

## 2017-02-23 DIAGNOSIS — I495 Sick sinus syndrome: Secondary | ICD-10-CM | POA: Diagnosis not present

## 2017-02-23 DIAGNOSIS — R634 Abnormal weight loss: Secondary | ICD-10-CM | POA: Diagnosis not present

## 2017-02-23 DIAGNOSIS — R630 Anorexia: Secondary | ICD-10-CM | POA: Diagnosis not present

## 2017-02-23 DIAGNOSIS — I4891 Unspecified atrial fibrillation: Secondary | ICD-10-CM | POA: Diagnosis not present

## 2017-02-24 DIAGNOSIS — I4891 Unspecified atrial fibrillation: Secondary | ICD-10-CM | POA: Diagnosis not present

## 2017-02-24 DIAGNOSIS — I495 Sick sinus syndrome: Secondary | ICD-10-CM | POA: Diagnosis not present

## 2017-02-24 DIAGNOSIS — R634 Abnormal weight loss: Secondary | ICD-10-CM | POA: Diagnosis not present

## 2017-02-24 DIAGNOSIS — E46 Unspecified protein-calorie malnutrition: Secondary | ICD-10-CM | POA: Diagnosis not present

## 2017-02-24 DIAGNOSIS — F0151 Vascular dementia with behavioral disturbance: Secondary | ICD-10-CM | POA: Diagnosis not present

## 2017-02-24 DIAGNOSIS — R630 Anorexia: Secondary | ICD-10-CM | POA: Diagnosis not present

## 2017-02-27 DIAGNOSIS — I495 Sick sinus syndrome: Secondary | ICD-10-CM | POA: Diagnosis not present

## 2017-02-27 DIAGNOSIS — F0151 Vascular dementia with behavioral disturbance: Secondary | ICD-10-CM | POA: Diagnosis not present

## 2017-02-27 DIAGNOSIS — R630 Anorexia: Secondary | ICD-10-CM | POA: Diagnosis not present

## 2017-02-27 DIAGNOSIS — I4891 Unspecified atrial fibrillation: Secondary | ICD-10-CM | POA: Diagnosis not present

## 2017-02-27 DIAGNOSIS — R634 Abnormal weight loss: Secondary | ICD-10-CM | POA: Diagnosis not present

## 2017-02-27 DIAGNOSIS — E46 Unspecified protein-calorie malnutrition: Secondary | ICD-10-CM | POA: Diagnosis not present

## 2017-02-28 DIAGNOSIS — R634 Abnormal weight loss: Secondary | ICD-10-CM | POA: Diagnosis not present

## 2017-02-28 DIAGNOSIS — R630 Anorexia: Secondary | ICD-10-CM | POA: Diagnosis not present

## 2017-02-28 DIAGNOSIS — I4891 Unspecified atrial fibrillation: Secondary | ICD-10-CM | POA: Diagnosis not present

## 2017-02-28 DIAGNOSIS — E46 Unspecified protein-calorie malnutrition: Secondary | ICD-10-CM | POA: Diagnosis not present

## 2017-02-28 DIAGNOSIS — F0151 Vascular dementia with behavioral disturbance: Secondary | ICD-10-CM | POA: Diagnosis not present

## 2017-02-28 DIAGNOSIS — I495 Sick sinus syndrome: Secondary | ICD-10-CM | POA: Diagnosis not present

## 2017-03-01 DIAGNOSIS — H3530 Unspecified macular degeneration: Secondary | ICD-10-CM | POA: Diagnosis not present

## 2017-03-01 DIAGNOSIS — I495 Sick sinus syndrome: Secondary | ICD-10-CM | POA: Diagnosis not present

## 2017-03-01 DIAGNOSIS — I10 Essential (primary) hypertension: Secondary | ICD-10-CM | POA: Diagnosis not present

## 2017-03-01 DIAGNOSIS — E785 Hyperlipidemia, unspecified: Secondary | ICD-10-CM | POA: Diagnosis not present

## 2017-03-01 DIAGNOSIS — E46 Unspecified protein-calorie malnutrition: Secondary | ICD-10-CM | POA: Diagnosis not present

## 2017-03-01 DIAGNOSIS — I4891 Unspecified atrial fibrillation: Secondary | ICD-10-CM | POA: Diagnosis not present

## 2017-03-01 DIAGNOSIS — F339 Major depressive disorder, recurrent, unspecified: Secondary | ICD-10-CM | POA: Diagnosis not present

## 2017-03-01 DIAGNOSIS — R630 Anorexia: Secondary | ICD-10-CM | POA: Diagnosis not present

## 2017-03-01 DIAGNOSIS — R634 Abnormal weight loss: Secondary | ICD-10-CM | POA: Diagnosis not present

## 2017-03-01 DIAGNOSIS — E1159 Type 2 diabetes mellitus with other circulatory complications: Secondary | ICD-10-CM | POA: Diagnosis not present

## 2017-03-01 DIAGNOSIS — M84359S Stress fracture, hip, unspecified, sequela: Secondary | ICD-10-CM | POA: Diagnosis not present

## 2017-03-01 DIAGNOSIS — E039 Hypothyroidism, unspecified: Secondary | ICD-10-CM | POA: Diagnosis not present

## 2017-03-01 DIAGNOSIS — F0151 Vascular dementia with behavioral disturbance: Secondary | ICD-10-CM | POA: Diagnosis not present

## 2017-03-02 DIAGNOSIS — R630 Anorexia: Secondary | ICD-10-CM | POA: Diagnosis not present

## 2017-03-02 DIAGNOSIS — F0151 Vascular dementia with behavioral disturbance: Secondary | ICD-10-CM | POA: Diagnosis not present

## 2017-03-02 DIAGNOSIS — I4891 Unspecified atrial fibrillation: Secondary | ICD-10-CM | POA: Diagnosis not present

## 2017-03-02 DIAGNOSIS — E46 Unspecified protein-calorie malnutrition: Secondary | ICD-10-CM | POA: Diagnosis not present

## 2017-03-02 DIAGNOSIS — R634 Abnormal weight loss: Secondary | ICD-10-CM | POA: Diagnosis not present

## 2017-03-02 DIAGNOSIS — I495 Sick sinus syndrome: Secondary | ICD-10-CM | POA: Diagnosis not present

## 2017-03-03 DIAGNOSIS — R634 Abnormal weight loss: Secondary | ICD-10-CM | POA: Diagnosis not present

## 2017-03-03 DIAGNOSIS — F0151 Vascular dementia with behavioral disturbance: Secondary | ICD-10-CM | POA: Diagnosis not present

## 2017-03-03 DIAGNOSIS — R630 Anorexia: Secondary | ICD-10-CM | POA: Diagnosis not present

## 2017-03-03 DIAGNOSIS — I4891 Unspecified atrial fibrillation: Secondary | ICD-10-CM | POA: Diagnosis not present

## 2017-03-03 DIAGNOSIS — E46 Unspecified protein-calorie malnutrition: Secondary | ICD-10-CM | POA: Diagnosis not present

## 2017-03-03 DIAGNOSIS — I495 Sick sinus syndrome: Secondary | ICD-10-CM | POA: Diagnosis not present

## 2017-03-06 DIAGNOSIS — F0151 Vascular dementia with behavioral disturbance: Secondary | ICD-10-CM | POA: Diagnosis not present

## 2017-03-06 DIAGNOSIS — E46 Unspecified protein-calorie malnutrition: Secondary | ICD-10-CM | POA: Diagnosis not present

## 2017-03-06 DIAGNOSIS — R634 Abnormal weight loss: Secondary | ICD-10-CM | POA: Diagnosis not present

## 2017-03-06 DIAGNOSIS — R630 Anorexia: Secondary | ICD-10-CM | POA: Diagnosis not present

## 2017-03-06 DIAGNOSIS — I4891 Unspecified atrial fibrillation: Secondary | ICD-10-CM | POA: Diagnosis not present

## 2017-03-06 DIAGNOSIS — I495 Sick sinus syndrome: Secondary | ICD-10-CM | POA: Diagnosis not present

## 2017-03-07 ENCOUNTER — Encounter: Payer: Self-pay | Admitting: Nurse Practitioner

## 2017-03-07 ENCOUNTER — Non-Acute Institutional Stay (SKILLED_NURSING_FACILITY): Payer: Medicare Other | Admitting: Nurse Practitioner

## 2017-03-07 DIAGNOSIS — F0150 Vascular dementia without behavioral disturbance: Secondary | ICD-10-CM

## 2017-03-07 DIAGNOSIS — F419 Anxiety disorder, unspecified: Secondary | ICD-10-CM

## 2017-03-07 DIAGNOSIS — F0151 Vascular dementia with behavioral disturbance: Secondary | ICD-10-CM | POA: Diagnosis not present

## 2017-03-07 DIAGNOSIS — F329 Major depressive disorder, single episode, unspecified: Secondary | ICD-10-CM | POA: Diagnosis not present

## 2017-03-07 DIAGNOSIS — R634 Abnormal weight loss: Secondary | ICD-10-CM | POA: Diagnosis not present

## 2017-03-07 DIAGNOSIS — R630 Anorexia: Secondary | ICD-10-CM | POA: Diagnosis not present

## 2017-03-07 DIAGNOSIS — I495 Sick sinus syndrome: Secondary | ICD-10-CM | POA: Diagnosis not present

## 2017-03-07 DIAGNOSIS — I4891 Unspecified atrial fibrillation: Secondary | ICD-10-CM | POA: Diagnosis not present

## 2017-03-07 DIAGNOSIS — E46 Unspecified protein-calorie malnutrition: Secondary | ICD-10-CM | POA: Diagnosis not present

## 2017-03-07 NOTE — Progress Notes (Signed)
Location:  [facility] Room Number: 30 Place of Service:  SNF (31) Provider:  Calbert Hulsebus, Manxie  NP  Oneal Grout, MD  Patient Care Team: Oneal Grout, MD as PCP - General (Internal Medicine) Letonya Mangels X, NP as Nurse Practitioner (Nurse Practitioner) [facility] [place], [place] Lowella Bandy., MD as Consulting Physician (Urology) Rachael Fee, MD as Consulting Physician (Gastroenterology) Salvatore Marvel, MD as Consulting Physician (Orthopedic Surgery) Venancio Poisson, MD as Consulting Physician (Dermatology) Sheral Apley, MD as Attending Physician (Orthopedic Surgery)  Extended Emergency Contact Information Primary Emergency Contact: Cook,[name] Address: [address]          [place], [place] [address] [place] Home Phone: [phone] Work Phone: [phone] Mobile Phone: [phone] Relation: Son Secondary Emergency Contact: [place],[name] Address: [address]          [place], [place] [address] [place] Home Phone: [phone] Relation: Relative  Code Status:  DNR Goals of care: Advanced Directive information Advanced Directives 03/07/2017  Does Patient Have a Medical Advance Directive? Yes  Type of Estate agent of [place];Living will;Out of facility DNR (pink MOST or yellow form)  Does patient want to make changes to medical advance directive? No - Patient declined  Copy of Healthcare Power of Attorney in Chart? Yes  Pre-existing out of facility DNR order (yellow form or pink MOST form) Yellow form placed in chart (order not valid for inpatient use);Pink MOST form placed in chart (order not valid for inpatient use)     Chief Complaint  Patient presents with  . Acute Visit    evaluation for lorazepam    HPI:  Pt is a 83 y.o. female seen today for an acute visit for no  as needed Lorazepam required in the past week. Currently her mood is stable while scheduled Lorazepam 1mg  12noon and 4pm,  she also takes Lexapro 10mg  qd. The patient has no episodes of emotional outbursts in the past week.   End stage of dementia, she is total care of her ADLs, resides in SNF   Past Medical History:  Diagnosis Date  . Abnormality of gait 09/08/2003  . AF (atrial fibrillation) (HCC)   . Anxiety state, unspecified 08/19/2010  . Aortic stenosis   . Atrial fibrillation (HCC) 07/14/2009   Qualifier: Diagnosis of  By: [name], MD, Ruthann Cancer Ty Hilts   . Bradycardia   . Cardiac pacemaker in situ 09/12/2004  . Closed fracture of lumbar vertebra without mention of spinal cord injury 110/08/202010  . Closed fracture of unspecified trochanteric section of femur 05/29/2011  . Constipation 03/14/2014  . Dementia 2013   04/03/14 MMSE 22/30   . Depression 04/10/2014  . Diffuse cystic mastopathy 12/23/2009  . Disturbance of salivary secretion [id number]  . Edema 08/18/2003  . Fall 12/19/2012   07/22/14 fall VS 144/76, 60, 20, 97.1. No apparent injury   . Flatulence, eructation, and gas pain 12/03/2009  . Fracture of greater trochanter of left femur (HCC) 03/08/14  . FTT (failure to thrive) in adult 12/15/2014  . Hearing loss   . Hemorrhoids   . Hip fracture (HCC)   . HTN (hypertension)   . Hypothyroidism 12/19/2007   Qualifier: Diagnosis of  By: Melvyn Neth CMA (AAMA), Patty  01/16/14 TSH 2.474 03/24/14 TSH 2.576    . 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 Loss of weight 10/13/2014   Multiple factorials: dementia, depression, advanced age, possible AR of  medications(metformin and Oxybutynin)-will increase Mirtazapine and continue supplement. Observe.    . Macular degeneration (senile) of retina, unspecified 12/12/2001  . Macular degeneration of both eyes 01/24/2013   Severe visual impairment. Nearly blind.   . Mitral valve prolapse   . Occlusion and stenosis of carotid artery without mention of cerebral infarction 12/27/2007  . Other and unspecified hyperlipidemia   . Other malaise  and fatigue 12/06/2007  . Pacemaker-dual  medtronic 09/07/2011  . Personal history of fall 01/29/2009  . Rectal bleeding   . Senile osteoporosis 12/13/2000  . Sinus node dysfunction (HCC)   . Syncope and collapse 12/27/2007  . Trochanteric fracture of left femur (HCC) 03/14/2014   CT scan did however reveal acute fracture in greater left trochanteric. Dr. Margarita Ranaimothy Murphy consulted. Recommended nonoperative management and weightbearing as tolerated and another 3 weeks of Lovenox for DVT prophylaxis.       . Type 2 diabetes mellitus with blindness, with macular edema, with severe nonproliferative retinopathy 12/19/2007   Qualifier: Diagnosis of  By: Melvyn NethLewis CMA (AAMA), Patty  01/09/14 Hgb A1c 6.5 01/16/14 Hgb A1c 6.5 03/24/14 Hgb 6.4 12/10/14 pharm: dc Metformin and CBG monitoring-failure to thrive and refusing to eat.     Marland Kitchen. Unspecified hereditary and idiopathic peripheral neuropathy 10/28/202012  . Unspecified hypothyroidism 03/18/2010  . Unspecified urinary incontinence 02/22/2005  . Urinary tract infection, site not specified 04/10/2014  . Xerophthalmus 01/21/2015   Past Surgical History:  Procedure Laterality Date  . BREAST BIOPSY Left 1970   benign  . Left hip surgery  09/2005  . PACEMAKER INSERTION  2006   Medtronic In-Pulse  . Right hip surgery  08/2003  . TONSILLECTOMY    . 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 Codeine     unknown  . Ibuprofen     unknown  . Lisinopril     unknown  . Penicillins     unknown  . Sanctura [Trospium Chloride]     unknown    Outpatient Encounter Prescriptions as of 03/07/2017  Medication Sig  . acetaminophen (TYLENOL) 325 MG tablet Take 650 mg by mouth every 4 (four) hours as needed for mild pain or moderate pain.   Marland Kitchen. atropine 1 % ophthalmic solution Place 4 drops on tongue  as needed for excess secretions  . bisacodyl (DULCOLAX) 10 MG suppository Place 10  mg rectally daily as needed for moderate constipation.   Marland Kitchen. escitalopram (LEXAPRO) 10 MG tablet Take 15 mg by mouth daily.   Marland Kitchen. LORazepam (ATIVAN) 1 MG tablet Take 1 mg by mouth. Take one at 12:00 noon and  at 4 pm, for 60 days (01/16/17)  Take one tablet every 2 hours as needed for anxiety .  . sennosides-docusate sodium (SENOKOT-S) 8.6-50 MG tablet Take 2 tablets by mouth at bedtime. Reported on 12/11/2015  . [DISCONTINUED] morphine (ROXANOL) 20 MG/ML concentrated solution Give 0.3625ml (5mg ) sublingual under tongue  every two hours as needed for pain   No facility-administered encounter medications on file as of 03/07/2017.     Review of Systems  Constitutional: Negative for activity change and appetite change.       Emaciated. ROS per staff  HENT: Positive for hearing loss. Negative for congestion.   Eyes:       Low vision Dry eyes  Respiratory: Negative for cough and shortness of breath.        Occasional hacking  cough, non productive, no O2 desat, afebrile.  Cardiovascular: Negative for chest pain and leg swelling.  Gastrointestinal: Negative for abdominal distention, abdominal pain, constipation, nausea and vomiting.  Endocrine:       Hx of abnormal TSH  Genitourinary: Negative for difficulty urinating.       Incontinent of urine  Musculoskeletal: Positive for arthralgias.       Left hip/leg pain. No longer ambulatory  Skin: Negative for rash and wound.  Neurological: Negative for dizziness, seizures and headaches.       Severre dementia  Hematological: Negative.   Psychiatric/Behavioral: Positive for confusion. The patient is not nervous/anxious.     Immunization History  Administered Date(s) Administered  . Influenza-Unspecified 06/06/2013, 04/21/2015, 05/17/2016  . PPD Test 07/23/2011  . Pneumococcal Polysaccharide-23 03/14/2009  . Td 03/14/2009  . Zoster 06/29/2006   Pertinent  Health Maintenance Due  Topic Date Due  . OPHTHALMOLOGY EXAM  07/28/1927  . DEXA SCAN   07/27/1982  . PNA vac Low Risk Adult (2 of 2 - PCV13) 03/14/2010  . INFLUENZA VACCINE  03/01/2017  . HEMOGLOBIN A1C  06/08/2017  . FOOT EXAM  12/05/2017  . URINE MICROALBUMIN  12/06/2017   Fall Risk  01/26/2015 03/06/2014  Falls in the past year? Yes Yes  Number falls in past yr: 1 1  Comment - 03/06/14  Injury with Fall? Yes Yes  Comment - left hip   Risk Factor Category  - High Fall Risk  Risk for fall due to : History of fall(s);Impaired balance/gait;Impaired mobility -  Follow up Falls evaluation completed -   Functional Status Survey:    Vitals:   03/07/17 1455  BP: 138/78  Pulse: 74  Resp: 20  Temp: 98 F (36.7 C)  Weight: 95 lb 1.6 oz (43.1 kg)  Height: 5\' 2"  (1.575 m)   Body mass index is 17.39 kg/m. Physical Exam  Constitutional: No distress.  Frail and thin built, elderly female patient  HENT:  Head: Normocephalic and atraumatic.  Legally blind  Eyes: Pupils are equal, round, and reactive to light.  Neck: Normal range of motion. Neck supple.  Cardiovascular: Normal rate and regular rhythm.   Murmur heard. Pacemaker   Pulmonary/Chest: Effort normal and breath sounds normal.  Abdominal: Soft. Bowel sounds are normal. She exhibits no distension. There is no tenderness.  Musculoskeletal: She exhibits no edema.  Lymphadenopathy:    She has no cervical adenopathy.  Neurological: She is alert.  confusion  Skin: Skin is warm and dry. She is not diaphoretic.  geri sleeves to her arms and legs  Psychiatric:  No prn Lorazepam required in the past week.     Labs reviewed:  Recent Labs  12/06/16  NA 142  K 3.9  BUN 24*  CREATININE 0.9    Recent Labs  12/06/16  AST 21  ALT 11  ALKPHOS 70   No results for input(s): WBC, NEUTROABS, HGB, HCT, MCV, PLT in the last 8760 hours. Lab Results  Component Value Date   TSH 3.55 01/27/2015   Lab Results  Component Value Date   HGBA1C 5.2 12/06/2016   Lab Results  Component Value Date   CHOL 140 12/20/2012     HDL 38 12/20/2012   LDLCALC 57 12/20/2012   TRIG 227 (A) 12/20/2012   CHOLHDL 5.0 12/22/2007    Significant Diagnostic Results in last 30 days:  No results found.  Assessment/Plan Anxiety and depression no  as needed Lorazepam required in the past week. Currently her  mood is stable while scheduled Lorazepam 1mg  12noon and 4pm, she also takes Lexapro 10mg  qd     Dementia End stage of dementia, she is total care of her ADLs, resides in SNF        Family/ staff Communication: plan of care reviewed with the patient and charge nurse  Labs/tests ordered:  None  Time spend: 15 minutes.

## 2017-03-07 NOTE — Assessment & Plan Note (Signed)
no  as needed Lorazepam required in the past week. Currently her mood is stable while scheduled Lorazepam 1mg  12noon and 4pm, she also takes Lexapro 10mg  qd

## 2017-03-07 NOTE — Assessment & Plan Note (Signed)
End stage of dementia, she is total care of her ADLs, resides in SNF

## 2017-03-08 DIAGNOSIS — R634 Abnormal weight loss: Secondary | ICD-10-CM | POA: Diagnosis not present

## 2017-03-08 DIAGNOSIS — I495 Sick sinus syndrome: Secondary | ICD-10-CM | POA: Diagnosis not present

## 2017-03-08 DIAGNOSIS — E46 Unspecified protein-calorie malnutrition: Secondary | ICD-10-CM | POA: Diagnosis not present

## 2017-03-08 DIAGNOSIS — I4891 Unspecified atrial fibrillation: Secondary | ICD-10-CM | POA: Diagnosis not present

## 2017-03-08 DIAGNOSIS — F0151 Vascular dementia with behavioral disturbance: Secondary | ICD-10-CM | POA: Diagnosis not present

## 2017-03-08 DIAGNOSIS — R630 Anorexia: Secondary | ICD-10-CM | POA: Diagnosis not present

## 2017-03-09 DIAGNOSIS — I495 Sick sinus syndrome: Secondary | ICD-10-CM | POA: Diagnosis not present

## 2017-03-09 DIAGNOSIS — E46 Unspecified protein-calorie malnutrition: Secondary | ICD-10-CM | POA: Diagnosis not present

## 2017-03-09 DIAGNOSIS — R630 Anorexia: Secondary | ICD-10-CM | POA: Diagnosis not present

## 2017-03-09 DIAGNOSIS — R634 Abnormal weight loss: Secondary | ICD-10-CM | POA: Diagnosis not present

## 2017-03-09 DIAGNOSIS — F0151 Vascular dementia with behavioral disturbance: Secondary | ICD-10-CM | POA: Diagnosis not present

## 2017-03-09 DIAGNOSIS — I4891 Unspecified atrial fibrillation: Secondary | ICD-10-CM | POA: Diagnosis not present

## 2017-03-10 DIAGNOSIS — E46 Unspecified protein-calorie malnutrition: Secondary | ICD-10-CM | POA: Diagnosis not present

## 2017-03-10 DIAGNOSIS — R634 Abnormal weight loss: Secondary | ICD-10-CM | POA: Diagnosis not present

## 2017-03-10 DIAGNOSIS — F0151 Vascular dementia with behavioral disturbance: Secondary | ICD-10-CM | POA: Diagnosis not present

## 2017-03-10 DIAGNOSIS — I495 Sick sinus syndrome: Secondary | ICD-10-CM | POA: Diagnosis not present

## 2017-03-10 DIAGNOSIS — R630 Anorexia: Secondary | ICD-10-CM | POA: Diagnosis not present

## 2017-03-10 DIAGNOSIS — I4891 Unspecified atrial fibrillation: Secondary | ICD-10-CM | POA: Diagnosis not present

## 2017-03-13 ENCOUNTER — Non-Acute Institutional Stay (SKILLED_NURSING_FACILITY): Payer: Medicare Other | Admitting: Nurse Practitioner

## 2017-03-13 ENCOUNTER — Encounter: Payer: Self-pay | Admitting: Nurse Practitioner

## 2017-03-13 DIAGNOSIS — K5900 Constipation, unspecified: Secondary | ICD-10-CM | POA: Diagnosis not present

## 2017-03-13 DIAGNOSIS — F419 Anxiety disorder, unspecified: Secondary | ICD-10-CM

## 2017-03-13 DIAGNOSIS — I4891 Unspecified atrial fibrillation: Secondary | ICD-10-CM | POA: Diagnosis not present

## 2017-03-13 DIAGNOSIS — R630 Anorexia: Secondary | ICD-10-CM | POA: Diagnosis not present

## 2017-03-13 DIAGNOSIS — F0151 Vascular dementia with behavioral disturbance: Secondary | ICD-10-CM

## 2017-03-13 DIAGNOSIS — I495 Sick sinus syndrome: Secondary | ICD-10-CM | POA: Diagnosis not present

## 2017-03-13 DIAGNOSIS — F01518 Vascular dementia, unspecified severity, with other behavioral disturbance: Secondary | ICD-10-CM

## 2017-03-13 DIAGNOSIS — F329 Major depressive disorder, single episode, unspecified: Secondary | ICD-10-CM | POA: Diagnosis not present

## 2017-03-13 DIAGNOSIS — E46 Unspecified protein-calorie malnutrition: Secondary | ICD-10-CM | POA: Diagnosis not present

## 2017-03-13 DIAGNOSIS — R634 Abnormal weight loss: Secondary | ICD-10-CM | POA: Diagnosis not present

## 2017-03-13 NOTE — Assessment & Plan Note (Addendum)
Her mood is managed with Lexapro 10mg , Lorazepam 1mg  bid at 12pm and 4pm

## 2017-03-13 NOTE — Assessment & Plan Note (Signed)
Stable, managed with Senna S II hs and prn Bisacodyl suppository

## 2017-03-13 NOTE — Progress Notes (Signed)
Location:  [facility] Room Number: 30 Place of Service:  SNF (31) Provider:  Mast, Manxie  NP  Oneal Grout, MD  Patient Care Team: Oneal Grout, MD as PCP - General (Internal Medicine) Mast, Man X, NP as Nurse Practitioner (Nurse Practitioner) [place] [place], [place] Lowella Bandy., MD as Consulting Physician (Urology) Rachael Fee, MD as Consulting Physician (Gastroenterology) Salvatore Marvel, MD as Consulting Physician (Orthopedic Surgery) Venancio Poisson, MD as Consulting Physician (Dermatology) Sheral Apley, MD as Attending Physician (Orthopedic Surgery)  Extended Emergency Contact Information Primary Emergency Contact: Cook,[name] Address: [address]          [place], [place] [address] [place] Home Phone: [phone] Work Phone: [phone] Mobile Phone: [phone] Relation: Son Secondary Emergency Contact: Musa,[name] Address: [address]          [place], [place] [address] [place] Home Phone: [phone] Relation: Relative  Code Status:  DNR Goals of care: Advanced Directive information Advanced Directives 03/13/2017  Does Patient Have a Medical Advance Directive? Yes  Type of Estate agent of [place];Living will;Out of facility DNR (pink MOST or yellow form)  Does patient want to make changes to medical advance directive? No - Patient declined  Copy of Healthcare Power of Attorney in Chart? Yes  Pre-existing out of facility DNR order (yellow form or pink MOST form) Yellow form placed in chart (order not valid for inpatient use)     Chief Complaint  Patient presents with  . Medical Management of Chronic Issues    HPI:  Pt is a 83 y.o. Farley seen today for medical management of chronic diseases.     The patient has been total dependent of ADL due to her advanced dementia, low vision, physical frailty. Her goal of care is comfort measures, she takes Lorazepam 1mg  bid,  Morphine 5mg  q2h prn available to her. She also takes Lexapro 15mg  po qd for mood, her constipation is managed with Senna S II hs and prn Bisacodyl suppository   Past Medical History:  Diagnosis Date  . Abnormality of gait 09/08/2003  . AF (atrial fibrillation) (HCC)   . Anxiety state, unspecified 08/19/2010  . Aortic stenosis   . Atrial fibrillation (HCC) 07/14/2009   Qualifier: Diagnosis of  By: [name], MD, Ruthann Cancer Ty Hilts   . Bradycardia   . Cardiac pacemaker in situ 09/12/2004  . Closed fracture of lumbar vertebra without mention of spinal cord injury 1August 21, 202010  . Closed fracture of unspecified trochanteric section of femur 05/29/2011  . Constipation 03/14/2014  . Dementia 2013   04/03/14 MMSE 22/30   . Depression 04/10/2014  . Diffuse cystic mastopathy 12/23/2009  . Disturbance of salivary secretion [id number]  . Edema 08/18/2003  . Fall 12/19/2012   07/22/14 fall VS 144/76, 60, 20, 97.1. No apparent injury   . Flatulence, eructation, and gas pain 12/03/2009  . Fracture of greater trochanter of left femur (HCC) 03/08/14  . FTT (failure to thrive) in adult 12/15/2014  . Hearing loss   . Hemorrhoids   . Hip fracture (HCC)   . HTN (hypertension)   . Hypothyroidism 12/19/2007   Qualifier: Diagnosis of  By: Melvyn Neth CMA (AAMA), Patty  01/16/14 TSH 2.474 03/24/14 TSH 2.576    . 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 Loss of weight 10/13/2014   Multiple factorials: dementia, depression, advanced age, possible AR of medications(metformin and Oxybutynin)-will increase Mirtazapine and continue  supplement. Observe.    . Macular degeneration (senile) of retina, unspecified 12/12/2001  . Macular degeneration of both eyes 01/24/2013   Severe visual impairment. Nearly blind.   . Mitral valve prolapse   . Occlusion and stenosis of carotid artery without mention of cerebral infarction 12/27/2007  . Other and unspecified hyperlipidemia   . Other malaise and fatigue  12/06/2007  . Pacemaker-dual  medtronic 09/07/2011  . Personal history of fall 01/29/2009  . Rectal bleeding   . Senile osteoporosis 12/13/2000  . Sinus node dysfunction (HCC)   . Syncope and collapse 12/27/2007  . Trochanteric fracture of left femur (HCC) 03/14/2014   CT scan did however reveal acute fracture in greater left trochanteric. Dr. Margarita Rana consulted. Recommended nonoperative management and weightbearing as tolerated and another 3 weeks of Lovenox for DVT prophylaxis.       . Type 2 diabetes mellitus with blindness, with macular edema, with severe nonproliferative retinopathy 12/19/2007   Qualifier: Diagnosis of  By: Melvyn Neth CMA (AAMA), Patty  01/09/14 Hgb A1c 6.5 01/16/14 Hgb A1c 6.5 03/24/14 Hgb 6.4 12/10/14 pharm: dc Metformin and CBG monitoring-failure to thrive and refusing to eat.     Marland Kitchen Unspecified hereditary and idiopathic peripheral neuropathy 10/28/202012  . Unspecified hypothyroidism 03/18/2010  . Unspecified urinary incontinence 02/22/2005  . Urinary tract infection, site not specified 04/10/2014  . Xerophthalmus 01/21/2015   Past Surgical History:  Procedure Laterality Date  . BREAST BIOPSY Left 1970   benign  . Left hip surgery  09/2005  . PACEMAKER INSERTION  2006   Medtronic In-Pulse  . Right hip surgery  08/2003  . TONSILLECTOMY    . 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 Codeine     unknown  . Ibuprofen     unknown  . Lisinopril     unknown  . Penicillins     unknown  . [place]ura [Trospium Chloride]     unknown    Outpatient Encounter Prescriptions as of 03/13/2017  Medication Sig  . acetaminophen (TYLENOL) 325 MG tablet Take 650 mg by mouth every 4 (four) hours as needed for mild pain or moderate pain.   Marland Kitchen atropine 1 % ophthalmic solution Place 4 drops on tongue  as needed for excess secretions  . bisacodyl (DULCOLAX) 10 MG suppository Place 10 mg rectally  daily as needed for moderate constipation.   Marland Kitchen escitalopram (LEXAPRO) 10 MG tablet Take 15 mg by mouth daily.   Marland Kitchen LORazepam (ATIVAN) 1 MG tablet Take 1 mg by mouth. Take one at 12:00 noon and  at 4 pm, for 60 days (01/16/17)  Take one tablet every 2 hours as needed for anxiety .  . morphine 20 MG/5ML solution Take 5 mg by mouth every 2 (two) hours as needed for pain. Give 0.15ml sublingual under tongue every two hours PRN.  . sennosides-docusate sodium (SENOKOT-S) 8.6-50 MG tablet Take 2 tablets by mouth at bedtime. Reported on 12/11/2015   No facility-administered encounter medications on file as of 03/13/2017.    ROS partially provided by staff.  Review of Systems  Unable to perform ROS: Dementia  Constitutional: Negative for activity change and appetite change.       Emaciated. ROS per staff  HENT: Positive for hearing loss. Negative for congestion.   Eyes:       Low vision Dry eyes  Respiratory: Negative for cough and shortness of breath.  Occasional hacking cough, non productive, no O2 desat, afebrile.  Cardiovascular: Negative for chest pain and leg swelling.  Gastrointestinal: Negative for abdominal distention and abdominal pain.  Endocrine:       Hx of abnormal TSH  Genitourinary: Negative for difficulty urinating.       Incontinent of urine  Musculoskeletal: Negative for arthralgias.       Not ambulatory  Skin: Negative for rash and wound.  Neurological:       Severe dementia  Hematological: Negative.   Psychiatric/Behavioral: Positive for agitation, behavioral problems and confusion.    Immunization History  Administered Date(s) Administered  . Influenza-Unspecified 06/06/2013, 04/21/2015, 05/17/2016  . PPD Test 07/23/2011  . Pneumococcal Polysaccharide-23 03/14/2009  . Td 03/14/2009  . Zoster 06/29/2006   Pertinent  Health Maintenance Due  Topic Date Due  . OPHTHALMOLOGY EXAM  07/28/1927  . DEXA SCAN  07/27/1982  . PNA vac Low Risk Adult (2 of 2 - PCV13)  03/14/2010  . INFLUENZA VACCINE  03/01/2017  . HEMOGLOBIN A1C  06/08/2017  . FOOT EXAM  12/05/2017  . URINE MICROALBUMIN  12/06/2017   Fall Risk  01/26/2015 03/06/2014  Falls in the past year? Yes Yes  Number falls in past yr: 1 1  Comment - 03/06/14  Injury with Fall? Yes Yes  Comment - left hip   Risk Factor Category  - High Fall Risk  Risk for fall due to : History of fall(s);Impaired balance/gait;Impaired mobility -  Follow up Falls evaluation completed -   Functional Status Survey:    Vitals:   03/13/17 1212  BP: 122/66  Pulse: (!) 55  Resp: 14  Temp: (!) 96.4 F (Crystal.8 C)  Weight: 91 lb 8 oz (41.5 kg)  Height: 5\' 2"  (1.575 m)   Body mass index is 16.74 kg/m. Physical Exam  Constitutional: No distress.  Frail and thin built, elderly Farley patient  HENT:  Head: Normocephalic and atraumatic.  Legally blind  Eyes: Pupils are equal, round, and reactive to light.  Neck: Normal range of motion. Neck supple.  Cardiovascular: Normal rate and regular rhythm.   Murmur heard. Pacemaker   Pulmonary/Chest: Effort normal and breath sounds normal.  Abdominal: Soft. Bowel sounds are normal. She exhibits no distension. There is no tenderness.  Musculoskeletal: She exhibits no edema.  Neurological: She is alert.  confusion  Skin: Skin is warm and dry. She is not diaphoretic.  geri sleeves to her arms and legs, left fore arm skin tear, approximated with steri strips, no sign of infection    Labs reviewed:  Recent Labs  12/06/16  NA 142  K 3.9  BUN 24*  CREATININE 0.9    Recent Labs  12/06/16  AST 21  ALT 11  ALKPHOS 70   No results for input(s): WBC, NEUTROABS, HGB, HCT, MCV, PLT in the last 8760 hours. Lab Results  Component Value Date   TSH 3.55 01/27/2015   Lab Results  Component Value Date   HGBA1C 5.2 12/06/2016   Lab Results  Component Value Date   CHOL 140 12/20/2012   HDL 38 12/20/2012   LDLCALC 57 12/20/2012   TRIG 227 (A) 12/20/2012   CHOLHDL  5.0 12/22/2007    Significant Diagnostic Results in last 30 days:  No results found.  Assessment/Plan Dementia End stage of dementia, she is total care of her ADLs, resides in SNF. She takes Lorazepam 1mg  bid, Morphine 5mg  q2h prn available to her.   Anxiety and depression Her mood is managed  with Lexapro 10mg , Lorazepam 1mg  bid at 12pm and 4pm   Constipation Stable, managed with Senna S II hs and prn Bisacodyl suppository    Family/ staff Communication: plan of care reviewed with the patient and charge nurse.    Labs/tests ordered:  None  Time spend 25 minutes

## 2017-03-13 NOTE — Assessment & Plan Note (Addendum)
End stage of dementia, she is total care of her ADLs, resides in SNF. She takes Lorazepam 1mg  bid, Morphine 5mg  q2h prn available to her.

## 2017-03-14 DIAGNOSIS — I4891 Unspecified atrial fibrillation: Secondary | ICD-10-CM | POA: Diagnosis not present

## 2017-03-14 DIAGNOSIS — I495 Sick sinus syndrome: Secondary | ICD-10-CM | POA: Diagnosis not present

## 2017-03-14 DIAGNOSIS — F0151 Vascular dementia with behavioral disturbance: Secondary | ICD-10-CM | POA: Diagnosis not present

## 2017-03-14 DIAGNOSIS — E46 Unspecified protein-calorie malnutrition: Secondary | ICD-10-CM | POA: Diagnosis not present

## 2017-03-14 DIAGNOSIS — R634 Abnormal weight loss: Secondary | ICD-10-CM | POA: Diagnosis not present

## 2017-03-14 DIAGNOSIS — R630 Anorexia: Secondary | ICD-10-CM | POA: Diagnosis not present

## 2017-03-15 DIAGNOSIS — I4891 Unspecified atrial fibrillation: Secondary | ICD-10-CM | POA: Diagnosis not present

## 2017-03-15 DIAGNOSIS — E46 Unspecified protein-calorie malnutrition: Secondary | ICD-10-CM | POA: Diagnosis not present

## 2017-03-15 DIAGNOSIS — R630 Anorexia: Secondary | ICD-10-CM | POA: Diagnosis not present

## 2017-03-15 DIAGNOSIS — F0151 Vascular dementia with behavioral disturbance: Secondary | ICD-10-CM | POA: Diagnosis not present

## 2017-03-15 DIAGNOSIS — I495 Sick sinus syndrome: Secondary | ICD-10-CM | POA: Diagnosis not present

## 2017-03-15 DIAGNOSIS — R634 Abnormal weight loss: Secondary | ICD-10-CM | POA: Diagnosis not present

## 2017-03-16 DIAGNOSIS — R630 Anorexia: Secondary | ICD-10-CM | POA: Diagnosis not present

## 2017-03-16 DIAGNOSIS — R634 Abnormal weight loss: Secondary | ICD-10-CM | POA: Diagnosis not present

## 2017-03-16 DIAGNOSIS — I495 Sick sinus syndrome: Secondary | ICD-10-CM | POA: Diagnosis not present

## 2017-03-16 DIAGNOSIS — I4891 Unspecified atrial fibrillation: Secondary | ICD-10-CM | POA: Diagnosis not present

## 2017-03-16 DIAGNOSIS — F0151 Vascular dementia with behavioral disturbance: Secondary | ICD-10-CM | POA: Diagnosis not present

## 2017-03-16 DIAGNOSIS — E46 Unspecified protein-calorie malnutrition: Secondary | ICD-10-CM | POA: Diagnosis not present

## 2017-03-17 DIAGNOSIS — F0151 Vascular dementia with behavioral disturbance: Secondary | ICD-10-CM | POA: Diagnosis not present

## 2017-03-17 DIAGNOSIS — R634 Abnormal weight loss: Secondary | ICD-10-CM | POA: Diagnosis not present

## 2017-03-17 DIAGNOSIS — R630 Anorexia: Secondary | ICD-10-CM | POA: Diagnosis not present

## 2017-03-17 DIAGNOSIS — I4891 Unspecified atrial fibrillation: Secondary | ICD-10-CM | POA: Diagnosis not present

## 2017-03-17 DIAGNOSIS — E46 Unspecified protein-calorie malnutrition: Secondary | ICD-10-CM | POA: Diagnosis not present

## 2017-03-17 DIAGNOSIS — I495 Sick sinus syndrome: Secondary | ICD-10-CM | POA: Diagnosis not present

## 2017-03-20 DIAGNOSIS — I495 Sick sinus syndrome: Secondary | ICD-10-CM | POA: Diagnosis not present

## 2017-03-20 DIAGNOSIS — E46 Unspecified protein-calorie malnutrition: Secondary | ICD-10-CM | POA: Diagnosis not present

## 2017-03-20 DIAGNOSIS — F0151 Vascular dementia with behavioral disturbance: Secondary | ICD-10-CM | POA: Diagnosis not present

## 2017-03-20 DIAGNOSIS — R634 Abnormal weight loss: Secondary | ICD-10-CM | POA: Diagnosis not present

## 2017-03-20 DIAGNOSIS — I4891 Unspecified atrial fibrillation: Secondary | ICD-10-CM | POA: Diagnosis not present

## 2017-03-20 DIAGNOSIS — R630 Anorexia: Secondary | ICD-10-CM | POA: Diagnosis not present

## 2017-03-21 DIAGNOSIS — I495 Sick sinus syndrome: Secondary | ICD-10-CM | POA: Diagnosis not present

## 2017-03-21 DIAGNOSIS — F0151 Vascular dementia with behavioral disturbance: Secondary | ICD-10-CM | POA: Diagnosis not present

## 2017-03-21 DIAGNOSIS — R634 Abnormal weight loss: Secondary | ICD-10-CM | POA: Diagnosis not present

## 2017-03-21 DIAGNOSIS — I4891 Unspecified atrial fibrillation: Secondary | ICD-10-CM | POA: Diagnosis not present

## 2017-03-21 DIAGNOSIS — R630 Anorexia: Secondary | ICD-10-CM | POA: Diagnosis not present

## 2017-03-21 DIAGNOSIS — E46 Unspecified protein-calorie malnutrition: Secondary | ICD-10-CM | POA: Diagnosis not present

## 2017-03-22 DIAGNOSIS — R634 Abnormal weight loss: Secondary | ICD-10-CM | POA: Diagnosis not present

## 2017-03-22 DIAGNOSIS — I495 Sick sinus syndrome: Secondary | ICD-10-CM | POA: Diagnosis not present

## 2017-03-22 DIAGNOSIS — E46 Unspecified protein-calorie malnutrition: Secondary | ICD-10-CM | POA: Diagnosis not present

## 2017-03-22 DIAGNOSIS — F0151 Vascular dementia with behavioral disturbance: Secondary | ICD-10-CM | POA: Diagnosis not present

## 2017-03-22 DIAGNOSIS — R630 Anorexia: Secondary | ICD-10-CM | POA: Diagnosis not present

## 2017-03-22 DIAGNOSIS — I4891 Unspecified atrial fibrillation: Secondary | ICD-10-CM | POA: Diagnosis not present

## 2017-03-23 DIAGNOSIS — I495 Sick sinus syndrome: Secondary | ICD-10-CM | POA: Diagnosis not present

## 2017-03-23 DIAGNOSIS — R634 Abnormal weight loss: Secondary | ICD-10-CM | POA: Diagnosis not present

## 2017-03-23 DIAGNOSIS — F0151 Vascular dementia with behavioral disturbance: Secondary | ICD-10-CM | POA: Diagnosis not present

## 2017-03-23 DIAGNOSIS — I4891 Unspecified atrial fibrillation: Secondary | ICD-10-CM | POA: Diagnosis not present

## 2017-03-23 DIAGNOSIS — R630 Anorexia: Secondary | ICD-10-CM | POA: Diagnosis not present

## 2017-03-23 DIAGNOSIS — E46 Unspecified protein-calorie malnutrition: Secondary | ICD-10-CM | POA: Diagnosis not present

## 2017-03-24 DIAGNOSIS — E46 Unspecified protein-calorie malnutrition: Secondary | ICD-10-CM | POA: Diagnosis not present

## 2017-03-24 DIAGNOSIS — R630 Anorexia: Secondary | ICD-10-CM | POA: Diagnosis not present

## 2017-03-24 DIAGNOSIS — I495 Sick sinus syndrome: Secondary | ICD-10-CM | POA: Diagnosis not present

## 2017-03-24 DIAGNOSIS — R634 Abnormal weight loss: Secondary | ICD-10-CM | POA: Diagnosis not present

## 2017-03-24 DIAGNOSIS — I4891 Unspecified atrial fibrillation: Secondary | ICD-10-CM | POA: Diagnosis not present

## 2017-03-24 DIAGNOSIS — F0151 Vascular dementia with behavioral disturbance: Secondary | ICD-10-CM | POA: Diagnosis not present

## 2017-03-27 DIAGNOSIS — R630 Anorexia: Secondary | ICD-10-CM | POA: Diagnosis not present

## 2017-03-27 DIAGNOSIS — I4891 Unspecified atrial fibrillation: Secondary | ICD-10-CM | POA: Diagnosis not present

## 2017-03-27 DIAGNOSIS — I495 Sick sinus syndrome: Secondary | ICD-10-CM | POA: Diagnosis not present

## 2017-03-27 DIAGNOSIS — F0151 Vascular dementia with behavioral disturbance: Secondary | ICD-10-CM | POA: Diagnosis not present

## 2017-03-27 DIAGNOSIS — E46 Unspecified protein-calorie malnutrition: Secondary | ICD-10-CM | POA: Diagnosis not present

## 2017-03-27 DIAGNOSIS — R634 Abnormal weight loss: Secondary | ICD-10-CM | POA: Diagnosis not present

## 2017-03-28 DIAGNOSIS — I4891 Unspecified atrial fibrillation: Secondary | ICD-10-CM | POA: Diagnosis not present

## 2017-03-28 DIAGNOSIS — R630 Anorexia: Secondary | ICD-10-CM | POA: Diagnosis not present

## 2017-03-28 DIAGNOSIS — E46 Unspecified protein-calorie malnutrition: Secondary | ICD-10-CM | POA: Diagnosis not present

## 2017-03-28 DIAGNOSIS — F0151 Vascular dementia with behavioral disturbance: Secondary | ICD-10-CM | POA: Diagnosis not present

## 2017-03-28 DIAGNOSIS — I495 Sick sinus syndrome: Secondary | ICD-10-CM | POA: Diagnosis not present

## 2017-03-28 DIAGNOSIS — R634 Abnormal weight loss: Secondary | ICD-10-CM | POA: Diagnosis not present

## 2017-03-29 DIAGNOSIS — R630 Anorexia: Secondary | ICD-10-CM | POA: Diagnosis not present

## 2017-03-29 DIAGNOSIS — R634 Abnormal weight loss: Secondary | ICD-10-CM | POA: Diagnosis not present

## 2017-03-29 DIAGNOSIS — E46 Unspecified protein-calorie malnutrition: Secondary | ICD-10-CM | POA: Diagnosis not present

## 2017-03-29 DIAGNOSIS — I495 Sick sinus syndrome: Secondary | ICD-10-CM | POA: Diagnosis not present

## 2017-03-29 DIAGNOSIS — F0151 Vascular dementia with behavioral disturbance: Secondary | ICD-10-CM | POA: Diagnosis not present

## 2017-03-29 DIAGNOSIS — I4891 Unspecified atrial fibrillation: Secondary | ICD-10-CM | POA: Diagnosis not present

## 2017-03-30 ENCOUNTER — Non-Acute Institutional Stay (SKILLED_NURSING_FACILITY): Payer: Medicare Other

## 2017-03-30 DIAGNOSIS — Z0000 Encounter for general adult medical examination without abnormal findings: Secondary | ICD-10-CM | POA: Diagnosis not present

## 2017-03-30 DIAGNOSIS — I4891 Unspecified atrial fibrillation: Secondary | ICD-10-CM | POA: Diagnosis not present

## 2017-03-30 DIAGNOSIS — F0151 Vascular dementia with behavioral disturbance: Secondary | ICD-10-CM | POA: Diagnosis not present

## 2017-03-30 DIAGNOSIS — I495 Sick sinus syndrome: Secondary | ICD-10-CM | POA: Diagnosis not present

## 2017-03-30 DIAGNOSIS — R634 Abnormal weight loss: Secondary | ICD-10-CM | POA: Diagnosis not present

## 2017-03-30 DIAGNOSIS — R630 Anorexia: Secondary | ICD-10-CM | POA: Diagnosis not present

## 2017-03-30 DIAGNOSIS — E46 Unspecified protein-calorie malnutrition: Secondary | ICD-10-CM | POA: Diagnosis not present

## 2017-03-30 NOTE — Patient Instructions (Signed)
Ms. [name] , Thank you for taking time to come for your Medicare Wellness Visit. I appreciate your ongoing commitment to your health goals. Please review the following plan we discussed and let me know if I can assist you in the future.   Screening recommendations/referrals: Colonoscopy excluded, pt over age 81 Mammogram excluded, pt over age 81 Bone Density up to date Recommended yearly ophthalmology/optometry visit for glaucoma screening and checkup Recommended yearly dental visit for hygiene and checkup  Vaccinations: Influenza vaccine excluded, hospice patient Pneumococcal vaccine due, excluded hospice patient Tdap vaccine up to date Shingles vaccine excluded, hospice patient  Advanced directives: In chart  Conditions/risks identified: none  Next appointment: Dr. Glade LloydPandey makes rounds   Preventive Care 8865 Years and Older, Female Preventive care refers to lifestyle choices and visits with your health care provider that can promote health and wellness. What does preventive care include?  A yearly physical exam. This is also called an annual well check.  Dental exams once or twice a year.  Routine eye exams. Ask your health care provider how often you should have your eyes checked.  Personal lifestyle choices, including:  Daily care of your teeth and gums.  Regular physical activity.  Eating a healthy diet.  Avoiding tobacco and drug use.  Limiting alcohol use.  Practicing safe sex.  Taking low-dose aspirin every day.  Taking vitamin and mineral supplements as recommended by your health care provider. What happens during an annual well check? The services and screenings done by your health care provider during your annual well check will depend on your age, overall health, lifestyle risk factors, and family history of disease. Counseling  Your health care provider may ask you questions about your:  Alcohol use.  Tobacco use.  Drug use.  Emotional  well-being.  Home and relationship well-being.  Sexual activity.  Eating habits.  History of falls.  Memory and ability to understand (cognition).  Work and work Astronomerenvironment.  Reproductive health. Screening  You may have the following tests or measurements:  Height, weight, and BMI.  Blood pressure.  Lipid and cholesterol levels. These may be checked every 5 years, or more frequently if you are over 81 years old.  Skin check.  Lung cancer screening. You may have this screening every year starting at age 81 if you have a 30-pack-year history of smoking and currently smoke or have quit within the past 15 years.  Fecal occult blood test (FOBT) of the stool. You may have this test every year starting at age 81.  Flexible sigmoidoscopy or colonoscopy. You may have a sigmoidoscopy every 5 years or a colonoscopy every 10 years starting at age 81.  Hepatitis C blood test.  Hepatitis B blood test.  Sexually transmitted disease (STD) testing.  Diabetes screening. This is done by checking your blood sugar (glucose) after you have not eaten for a while (fasting). You may have this done every 1-3 years.  Bone density scan. This is done to screen for osteoporosis. You may have this done starting at age 81.  Mammogram. This may be done every 1-2 years. Talk to your health care provider about how often you should have regular mammograms. Talk with your health care provider about your test results, treatment options, and if necessary, the need for more tests. Vaccines  Your health care provider may recommend certain vaccines, such as:  Influenza vaccine. This is recommended every year.  Tetanus, diphtheria, and acellular pertussis (Tdap, Td) vaccine. You may need a Td  booster every 10 years.  Zoster vaccine. You may need this after age 53.  Pneumococcal 13-valent conjugate (PCV13) vaccine. One dose is recommended after age 27.  Pneumococcal polysaccharide (PPSV23) vaccine. One  dose is recommended after age 21. Talk to your health care provider about which screenings and vaccines you need and how often you need them. This information is not intended to replace advice given to you by your health care provider. Make sure you discuss any questions you have with your health care provider. Document Released: 08/14/2015 Document Revised: 04/06/2016 Document Reviewed: 05/19/2015 Elsevier Interactive Patient Education  2017 [place] Prevention in the Home Falls can cause injuries. They can happen to people of all ages. There are many things you can do to make your home safe and to help prevent falls. What can I do on the outside of my home?  Regularly fix the edges of walkways and driveways and fix any cracks.  Remove anything that might make you trip as you walk through a door, such as a raised step or threshold.  Trim any bushes or trees on the path to your home.  Use bright outdoor lighting.  Clear any walking paths of anything that might make someone trip, such as rocks or tools.  Regularly check to see if handrails are loose or broken. Make sure that both sides of any steps have handrails.  Any raised decks and porches should have guardrails on the edges.  Have any leaves, snow, or ice cleared regularly.  Use sand or salt on walking paths during winter.  Clean up any spills in your garage right away. This includes oil or grease spills. What can I do in the bathroom?  Use night lights.  Install grab bars by the toilet and in the tub and shower. Do not use towel bars as grab bars.  Use non-skid mats or decals in the tub or shower.  If you need to sit down in the shower, use a plastic, non-slip stool.  Keep the floor dry. Clean up any water that spills on the floor as soon as it happens.  Remove soap buildup in the tub or shower regularly.  Attach bath mats securely with double-sided non-slip rug tape.  Do not have throw rugs and other  things on the floor that can make you trip. What can I do in the bedroom?  Use night lights.  Make sure that you have a light by your bed that is easy to reach.  Do not use any sheets or blankets that are too big for your bed. They should not hang down onto the floor.  Have a firm chair that has side arms. You can use this for support while you get dressed.  Do not have throw rugs and other things on the floor that can make you trip. What can I do in the kitchen?  Clean up any spills right away.  Avoid walking on wet floors.  Keep items that you use a lot in easy-to-reach places.  If you need to reach something above you, use a strong step stool that has a grab bar.  Keep electrical cords out of the way.  Do not use floor polish or wax that makes floors slippery. If you must use wax, use non-skid floor wax.  Do not have throw rugs and other things on the floor that can make you trip. What can I do with my stairs?  Do not leave any items on the stairs.  Make sure that there are handrails on both sides of the stairs and use them. Fix handrails that are broken or loose. Make sure that handrails are as long as the stairways.  Check any carpeting to make sure that it is firmly attached to the stairs. Fix any carpet that is loose or worn.  Avoid having throw rugs at the top or bottom of the stairs. If you do have throw rugs, attach them to the floor with carpet tape.  Make sure that you have a light switch at the top of the stairs and the bottom of the stairs. If you do not have them, ask someone to add them for you. What else can I do to help prevent falls?  Wear shoes that:  Do not have high heels.  Have rubber bottoms.  Are comfortable and fit you well.  Are closed at the toe. Do not wear sandals.  If you use a stepladder:  Make sure that it is fully opened. Do not climb a closed stepladder.  Make sure that both sides of the stepladder are locked into place.  Ask  someone to hold it for you, if possible.  Clearly mark and make sure that you can see:  Any grab bars or handrails.  First and last steps.  Where the edge of each step is.  Use tools that help you move around (mobility aids) if they are needed. These include:  Canes.  Walkers.  Scooters.  Crutches.  Turn on the lights when you go into a dark area. Replace any light bulbs as soon as they burn out.  Set up your furniture so you have a clear path. Avoid moving your furniture around.  If any of your floors are uneven, fix them.  If there are any pets around you, be aware of where they are.  Review your medicines with your doctor. Some medicines can make you feel dizzy. This can increase your chance of falling. Ask your doctor what other things that you can do to help prevent falls. This information is not intended to replace advice given to you by your health care provider. Make sure you discuss any questions you have with your health care provider. Document Released: 05/14/2009 Document Revised: 12/24/2015 Document Reviewed: 08/22/2014 Elsevier Interactive Patient Education  2017 [employer].

## 2017-03-30 NOTE — Progress Notes (Signed)
Subjective:   Crystal CaoSara F. [name] is a 83 y.o. female who presents for an Initial Medicare Annual Wellness Visit [facility]; incapacitated patient unable to answer questions appropriately, hospice patient     Objective:    Today's Vitals   03/30/17 1218  BP: 118/80  Pulse: 82  Temp: (!) 96.2 F (35.7 C)  TempSrc: Oral  SpO2: 97%  Weight: 92 lb (41.7 kg)  Height: 5\' 2"  (1.575 m)   Body mass index is 16.83 kg/m.   Current Medications (verified) Outpatient Encounter Prescriptions as of 03/30/2017  Medication Sig  . acetaminophen (TYLENOL) 325 MG tablet Take 650 mg by mouth every 4 (four) hours as needed for mild pain or moderate pain.   Marland Kitchen. atropine 1 % ophthalmic solution Place 4 drops on tongue  as needed for excess secretions  . bisacodyl (DULCOLAX) 10 MG suppository Place 10 mg rectally daily as needed for moderate constipation.   Marland Kitchen. escitalopram (LEXAPRO) 10 MG tablet Take 15 mg by mouth daily.   Marland Kitchen. LORazepam (ATIVAN) 1 MG tablet Take 1 mg by mouth. Take one at 12:00 noon and  at 4 pm, for 60 days (01/16/17)  Take one tablet every 2 hours as needed for anxiety .  . morphine 20 MG/5ML solution Take 5 mg by mouth every 2 (two) hours as needed for pain. Give 0.4325ml sublingual under tongue every two hours PRN.  . sennosides-docusate sodium (SENOKOT-S) 8.6-50 MG tablet Take 2 tablets by mouth at bedtime. Reported on 12/11/2015   No facility-administered encounter medications on file as of 03/30/2017.     Allergies (verified) Codeine; Ibuprofen; Lisinopril; Penicillins; and Sanctura [trospium chloride]   History: Past Medical History:  Diagnosis Date  . Abnormality of gait 09/08/2003  . AF (atrial fibrillation) (HCC)   . Anxiety state, unspecified 08/19/2010  . Aortic stenosis   . Atrial fibrillation (HCC) 07/14/2009   Qualifier: Diagnosis of  By: Graciela HusbandsKlein, MD, Ruthann CancerFACC, Ty HiltsSteven Cochran   . Bradycardia   . Cardiac pacemaker in situ 09/12/2004  . Closed fracture of  lumbar vertebra without mention of spinal cord injury 108/24/202010  . Closed fracture of unspecified trochanteric section of femur 05/29/2011  . Constipation 03/14/2014  . Dementia 2013   04/03/14 MMSE 22/30   . Depression 04/10/2014  . Diffuse cystic mastopathy 12/23/2009  . Disturbance of salivary secretion [id number]  . Edema 08/18/2003  . Fall 12/19/2012   07/22/14 fall VS 144/76, 60, 20, 97.1. No apparent injury   . Flatulence, eructation, and gas pain 12/03/2009  . Fracture of greater trochanter of left femur (HCC) 03/08/14  . FTT (failure to thrive) in adult 12/15/2014  . Hearing loss   . Hemorrhoids   . Hip fracture (HCC)   . HTN (hypertension)   . Hypothyroidism 12/19/2007   Qualifier: Diagnosis of  By: Melvyn NethLewis CMA (AAMA), Patty  01/16/14 TSH 2.474 03/24/14 TSH 2.576    . 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 Loss of weight 10/13/2014   Multiple factorials: dementia, depression, advanced age, possible AR of medications(metformin and Oxybutynin)-will increase Mirtazapine and continue supplement. Observe.    . Macular degeneration (senile) of retina, unspecified 12/12/2001  . Macular degeneration of both eyes 01/24/2013   Severe visual impairment. Nearly blind.   . Mitral valve prolapse   . Occlusion and stenosis of carotid artery without mention of cerebral infarction 12/27/2007  . Other and unspecified hyperlipidemia   . Other malaise  and fatigue 12/06/2007  . Pacemaker-dual  medtronic 09/07/2011  . Personal history of fall 01/29/2009  . Rectal bleeding   . Senile osteoporosis 12/13/2000  . Sinus node dysfunction (HCC)   . Syncope and collapse 12/27/2007  . Trochanteric fracture of left femur (HCC) 03/14/2014   CT scan did however reveal acute fracture in greater left trochanteric. Dr. Margarita Rana consulted. Recommended nonoperative management and weightbearing as tolerated and another 3 weeks of Lovenox for DVT prophylaxis.       . Type 2 diabetes  mellitus with blindness, with macular edema, with severe nonproliferative retinopathy 12/19/2007   Qualifier: Diagnosis of  By: Melvyn Neth CMA (AAMA), Patty  01/09/14 Hgb A1c 6.5 01/16/14 Hgb A1c 6.5 03/24/14 Hgb 6.4 12/10/14 pharm: dc Metformin and CBG monitoring-failure to thrive and refusing to eat.     Marland Kitchen Unspecified hereditary and idiopathic peripheral neuropathy 10/28/202012  . Unspecified hypothyroidism 03/18/2010  . Unspecified urinary incontinence 02/22/2005  . Urinary tract infection, site not specified 04/10/2014  . Xerophthalmus 01/21/2015   Past Surgical History:  Procedure Laterality Date  . BREAST BIOPSY Left 1970   benign  . Left hip surgery  09/2005  . PACEMAKER INSERTION  2006   Medtronic In-Pulse  . Right hip surgery  08/2003  . TONSILLECTOMY    . 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 Colon cancer Mother   . Cancer Mother        colon  . Heart attack Father   . Heart disease Father        MI  . Heart disease Sister        MI   Social History   Occupational History  . Not on file.   Social History Main Topics  . Smoking status: Never Smoker  . Smokeless tobacco: Never Used  . Alcohol use No  . Drug use: No  . Sexual activity: No    Tobacco Counseling Counseling given: Not Answered   Activities of Daily Living In your present state of health, do you have any difficulty performing the following activities: 03/30/2017  Hearing? Y  Vision? Y  Difficulty concentrating or making decisions? Y  Walking or climbing stairs? Y  Dressing or bathing? Y  Doing errands, shopping? Y  Preparing Food and eating ? Y  Using the Toilet? Y  In the past six months, have you accidently leaked urine? Y  Do you have problems with loss of bowel control? Y  Managing your Medications? Y  Managing your Finances? Y  Housekeeping or managing your Housekeeping? Y  Some  recent data might be hidden    Immunizations and Health Maintenance Immunization History  Administered Date(s) Administered  . Influenza-Unspecified 06/06/2013, 04/21/2015, 05/17/2016  . PPD Test 07/23/2011  . Pneumococcal Polysaccharide-23 03/14/2009  . Td 03/14/2009  . Zoster 06/29/2006   Health Maintenance Due  Topic Date Due  . OPHTHALMOLOGY EXAM  07[birth year]27  . DEXA SCAN  07/27/1982  . PNA vac Low Risk Adult (2 of 2 - PCV13) 03/14/2010  . INFLUENZA VACCINE  03/01/2017    Patient Care Team: Oneal Grout, MD as PCP - General (Internal Medicine) Mast, Man X, NP as Nurse Practitioner (Nurse Practitioner) [place], [place] Lowella Bandy., MD as Consulting Physician (Urology) Rachael Fee, MD as Consulting Physician (Gastroenterology) Salvatore Marvel, MD as Consulting Physician (Orthopedic Surgery) Venancio Poisson, MD as Consulting  Physician (Dermatology) Sheral Apley, MD as Attending Physician (Orthopedic Surgery)  Indicate any recent Medical Services you may have received from other than [facility] providers in the past year (date may be approximate).     Assessment:   This is a routine wellness examination for Consandra.   Hearing/Vision screen No exam data present  Dietary issues and exercise activities discussed: Current Exercise Habits: The patient does not participate in regular exercise at present, Exercise limited by: neurologic condition(s);orthopedic condition(s)  Goals    None     Depression Screen PHQ 2/9 Scores 03/30/2017 01/26/2015 03/06/2014  PHQ - 2 Score - 2 3  PHQ- 9 Score - 6 -  Exception Documentation Medical reason - -    Fall Risk Fall Risk  03/30/2017 01/26/2015 03/06/2014  Falls in the past year? No Yes Yes  Number falls in past yr: - 1 1  Comment - - 03/06/14  Injury with Fall? - Yes Yes  Comment - - left hip   Risk Factor Category  - - High Fall Risk  Risk for fall due to : - History of fall(s);Impaired balance/gait;Impaired  mobility -  Follow up - Falls evaluation completed -    Cognitive Function: MMSE - Mini Mental State Exam 03/30/2017  Not completed: Unable to complete        Screening Tests Health Maintenance  Topic Date Due  . OPHTHALMOLOGY EXAM  07[birth year]27  . DEXA SCAN  07/27/1982  . PNA vac Low Risk Adult (2 of 2 - PCV13) 03/14/2010  . INFLUENZA VACCINE  03/01/2017  . HEMOGLOBIN A1C  06/08/2017  . FOOT EXAM  12/05/2017  . URINE MICROALBUMIN  12/06/2017  . TETANUS/TDAP  03/15/2019      Plan:    I have personally reviewed and addressed the Medicare Annual Wellness questionnaire and have noted the following in the patient's chart:  A. Medical and social history B. Use of alcohol, tobacco or illicit drugs  C. Current medications and supplements D. Functional ability and status E.  Nutritional status F.  Physical activity G. Advance directives H. List of other physicians I.  Hospitalizations, surgeries, and ER visits in previous 12 months J.  Vitals K. Screenings to include hearing, vision, cognitive, depression L. Referrals and appointments - none  In addition, I am unable to review and discuss with incapacitated patient certain preventive protocols, quality metrics, and best practice recommendations. A written personalized care plan for preventive services as well as general preventive health recommendations were provided to patient.  See attached scanned questionnaire for additional information.   Signed,   Annetta Maw, RN Nurse Health Advisor   Quick Notes   Health Maintenance: Eye exam, pna 13 due. Not order hospice patient.     Abnormal Screen: Unable to complete mental exam     Patient Concerns: none     Nurse Concerns: none

## 2017-03-31 DIAGNOSIS — I495 Sick sinus syndrome: Secondary | ICD-10-CM | POA: Diagnosis not present

## 2017-03-31 DIAGNOSIS — E46 Unspecified protein-calorie malnutrition: Secondary | ICD-10-CM | POA: Diagnosis not present

## 2017-03-31 DIAGNOSIS — R630 Anorexia: Secondary | ICD-10-CM | POA: Diagnosis not present

## 2017-03-31 DIAGNOSIS — I4891 Unspecified atrial fibrillation: Secondary | ICD-10-CM | POA: Diagnosis not present

## 2017-03-31 DIAGNOSIS — F0151 Vascular dementia with behavioral disturbance: Secondary | ICD-10-CM | POA: Diagnosis not present

## 2017-03-31 DIAGNOSIS — R634 Abnormal weight loss: Secondary | ICD-10-CM | POA: Diagnosis not present

## 2017-04-01 DIAGNOSIS — E1159 Type 2 diabetes mellitus with other circulatory complications: Secondary | ICD-10-CM | POA: Diagnosis not present

## 2017-04-01 DIAGNOSIS — R634 Abnormal weight loss: Secondary | ICD-10-CM | POA: Diagnosis not present

## 2017-04-01 DIAGNOSIS — E46 Unspecified protein-calorie malnutrition: Secondary | ICD-10-CM | POA: Diagnosis not present

## 2017-04-01 DIAGNOSIS — M84359S Stress fracture, hip, unspecified, sequela: Secondary | ICD-10-CM | POA: Diagnosis not present

## 2017-04-01 DIAGNOSIS — H3530 Unspecified macular degeneration: Secondary | ICD-10-CM | POA: Diagnosis not present

## 2017-04-01 DIAGNOSIS — E785 Hyperlipidemia, unspecified: Secondary | ICD-10-CM | POA: Diagnosis not present

## 2017-04-01 DIAGNOSIS — I4891 Unspecified atrial fibrillation: Secondary | ICD-10-CM | POA: Diagnosis not present

## 2017-04-01 DIAGNOSIS — F0151 Vascular dementia with behavioral disturbance: Secondary | ICD-10-CM | POA: Diagnosis not present

## 2017-04-01 DIAGNOSIS — E039 Hypothyroidism, unspecified: Secondary | ICD-10-CM | POA: Diagnosis not present

## 2017-04-01 DIAGNOSIS — I495 Sick sinus syndrome: Secondary | ICD-10-CM | POA: Diagnosis not present

## 2017-04-01 DIAGNOSIS — R630 Anorexia: Secondary | ICD-10-CM | POA: Diagnosis not present

## 2017-04-01 DIAGNOSIS — I10 Essential (primary) hypertension: Secondary | ICD-10-CM | POA: Diagnosis not present

## 2017-04-01 DIAGNOSIS — F339 Major depressive disorder, recurrent, unspecified: Secondary | ICD-10-CM | POA: Diagnosis not present

## 2017-04-04 DIAGNOSIS — R630 Anorexia: Secondary | ICD-10-CM | POA: Diagnosis not present

## 2017-04-04 DIAGNOSIS — R634 Abnormal weight loss: Secondary | ICD-10-CM | POA: Diagnosis not present

## 2017-04-04 DIAGNOSIS — E46 Unspecified protein-calorie malnutrition: Secondary | ICD-10-CM | POA: Diagnosis not present

## 2017-04-04 DIAGNOSIS — I495 Sick sinus syndrome: Secondary | ICD-10-CM | POA: Diagnosis not present

## 2017-04-04 DIAGNOSIS — I4891 Unspecified atrial fibrillation: Secondary | ICD-10-CM | POA: Diagnosis not present

## 2017-04-04 DIAGNOSIS — F0151 Vascular dementia with behavioral disturbance: Secondary | ICD-10-CM | POA: Diagnosis not present

## 2017-04-05 DIAGNOSIS — I495 Sick sinus syndrome: Secondary | ICD-10-CM | POA: Diagnosis not present

## 2017-04-05 DIAGNOSIS — E46 Unspecified protein-calorie malnutrition: Secondary | ICD-10-CM | POA: Diagnosis not present

## 2017-04-05 DIAGNOSIS — F0151 Vascular dementia with behavioral disturbance: Secondary | ICD-10-CM | POA: Diagnosis not present

## 2017-04-05 DIAGNOSIS — R634 Abnormal weight loss: Secondary | ICD-10-CM | POA: Diagnosis not present

## 2017-04-05 DIAGNOSIS — R630 Anorexia: Secondary | ICD-10-CM | POA: Diagnosis not present

## 2017-04-05 DIAGNOSIS — I4891 Unspecified atrial fibrillation: Secondary | ICD-10-CM | POA: Diagnosis not present

## 2017-04-06 DIAGNOSIS — F0151 Vascular dementia with behavioral disturbance: Secondary | ICD-10-CM | POA: Diagnosis not present

## 2017-04-06 DIAGNOSIS — E46 Unspecified protein-calorie malnutrition: Secondary | ICD-10-CM | POA: Diagnosis not present

## 2017-04-06 DIAGNOSIS — R630 Anorexia: Secondary | ICD-10-CM | POA: Diagnosis not present

## 2017-04-06 DIAGNOSIS — I495 Sick sinus syndrome: Secondary | ICD-10-CM | POA: Diagnosis not present

## 2017-04-06 DIAGNOSIS — R634 Abnormal weight loss: Secondary | ICD-10-CM | POA: Diagnosis not present

## 2017-04-06 DIAGNOSIS — I4891 Unspecified atrial fibrillation: Secondary | ICD-10-CM | POA: Diagnosis not present

## 2017-04-07 DIAGNOSIS — R634 Abnormal weight loss: Secondary | ICD-10-CM | POA: Diagnosis not present

## 2017-04-07 DIAGNOSIS — F0151 Vascular dementia with behavioral disturbance: Secondary | ICD-10-CM | POA: Diagnosis not present

## 2017-04-07 DIAGNOSIS — R630 Anorexia: Secondary | ICD-10-CM | POA: Diagnosis not present

## 2017-04-07 DIAGNOSIS — I4891 Unspecified atrial fibrillation: Secondary | ICD-10-CM | POA: Diagnosis not present

## 2017-04-07 DIAGNOSIS — I495 Sick sinus syndrome: Secondary | ICD-10-CM | POA: Diagnosis not present

## 2017-04-07 DIAGNOSIS — E46 Unspecified protein-calorie malnutrition: Secondary | ICD-10-CM | POA: Diagnosis not present

## 2017-04-10 DIAGNOSIS — F0151 Vascular dementia with behavioral disturbance: Secondary | ICD-10-CM | POA: Diagnosis not present

## 2017-04-10 DIAGNOSIS — I495 Sick sinus syndrome: Secondary | ICD-10-CM | POA: Diagnosis not present

## 2017-04-10 DIAGNOSIS — E46 Unspecified protein-calorie malnutrition: Secondary | ICD-10-CM | POA: Diagnosis not present

## 2017-04-10 DIAGNOSIS — I4891 Unspecified atrial fibrillation: Secondary | ICD-10-CM | POA: Diagnosis not present

## 2017-04-10 DIAGNOSIS — R634 Abnormal weight loss: Secondary | ICD-10-CM | POA: Diagnosis not present

## 2017-04-10 DIAGNOSIS — R630 Anorexia: Secondary | ICD-10-CM | POA: Diagnosis not present

## 2017-04-11 DIAGNOSIS — R630 Anorexia: Secondary | ICD-10-CM | POA: Diagnosis not present

## 2017-04-11 DIAGNOSIS — R634 Abnormal weight loss: Secondary | ICD-10-CM | POA: Diagnosis not present

## 2017-04-11 DIAGNOSIS — E46 Unspecified protein-calorie malnutrition: Secondary | ICD-10-CM | POA: Diagnosis not present

## 2017-04-11 DIAGNOSIS — F0151 Vascular dementia with behavioral disturbance: Secondary | ICD-10-CM | POA: Diagnosis not present

## 2017-04-11 DIAGNOSIS — I4891 Unspecified atrial fibrillation: Secondary | ICD-10-CM | POA: Diagnosis not present

## 2017-04-11 DIAGNOSIS — I495 Sick sinus syndrome: Secondary | ICD-10-CM | POA: Diagnosis not present

## 2017-04-12 DIAGNOSIS — I495 Sick sinus syndrome: Secondary | ICD-10-CM | POA: Diagnosis not present

## 2017-04-12 DIAGNOSIS — R630 Anorexia: Secondary | ICD-10-CM | POA: Diagnosis not present

## 2017-04-12 DIAGNOSIS — E46 Unspecified protein-calorie malnutrition: Secondary | ICD-10-CM | POA: Diagnosis not present

## 2017-04-12 DIAGNOSIS — R634 Abnormal weight loss: Secondary | ICD-10-CM | POA: Diagnosis not present

## 2017-04-12 DIAGNOSIS — F0151 Vascular dementia with behavioral disturbance: Secondary | ICD-10-CM | POA: Diagnosis not present

## 2017-04-12 DIAGNOSIS — I4891 Unspecified atrial fibrillation: Secondary | ICD-10-CM | POA: Diagnosis not present

## 2017-04-13 DIAGNOSIS — R634 Abnormal weight loss: Secondary | ICD-10-CM | POA: Diagnosis not present

## 2017-04-13 DIAGNOSIS — I495 Sick sinus syndrome: Secondary | ICD-10-CM | POA: Diagnosis not present

## 2017-04-13 DIAGNOSIS — R630 Anorexia: Secondary | ICD-10-CM | POA: Diagnosis not present

## 2017-04-13 DIAGNOSIS — I4891 Unspecified atrial fibrillation: Secondary | ICD-10-CM | POA: Diagnosis not present

## 2017-04-13 DIAGNOSIS — E46 Unspecified protein-calorie malnutrition: Secondary | ICD-10-CM | POA: Diagnosis not present

## 2017-04-13 DIAGNOSIS — F0151 Vascular dementia with behavioral disturbance: Secondary | ICD-10-CM | POA: Diagnosis not present

## 2017-04-14 ENCOUNTER — Encounter: Payer: Self-pay | Admitting: Internal Medicine

## 2017-04-14 ENCOUNTER — Non-Acute Institutional Stay (SKILLED_NURSING_FACILITY): Payer: Medicare Other | Admitting: Internal Medicine

## 2017-04-14 DIAGNOSIS — R627 Adult failure to thrive: Secondary | ICD-10-CM | POA: Diagnosis not present

## 2017-04-14 DIAGNOSIS — I495 Sick sinus syndrome: Secondary | ICD-10-CM | POA: Diagnosis not present

## 2017-04-14 DIAGNOSIS — K5900 Constipation, unspecified: Secondary | ICD-10-CM | POA: Diagnosis not present

## 2017-04-14 DIAGNOSIS — G8929 Other chronic pain: Secondary | ICD-10-CM | POA: Diagnosis not present

## 2017-04-14 DIAGNOSIS — G309 Alzheimer's disease, unspecified: Secondary | ICD-10-CM | POA: Diagnosis not present

## 2017-04-14 DIAGNOSIS — E43 Unspecified severe protein-calorie malnutrition: Secondary | ICD-10-CM

## 2017-04-14 DIAGNOSIS — E46 Unspecified protein-calorie malnutrition: Secondary | ICD-10-CM | POA: Diagnosis not present

## 2017-04-14 DIAGNOSIS — F0281 Dementia in other diseases classified elsewhere with behavioral disturbance: Secondary | ICD-10-CM

## 2017-04-14 DIAGNOSIS — R630 Anorexia: Secondary | ICD-10-CM | POA: Diagnosis not present

## 2017-04-14 DIAGNOSIS — R634 Abnormal weight loss: Secondary | ICD-10-CM | POA: Diagnosis not present

## 2017-04-14 DIAGNOSIS — I4891 Unspecified atrial fibrillation: Secondary | ICD-10-CM | POA: Diagnosis not present

## 2017-04-14 DIAGNOSIS — F0151 Vascular dementia with behavioral disturbance: Secondary | ICD-10-CM

## 2017-04-14 DIAGNOSIS — H548 Legal blindness, as defined in USA: Secondary | ICD-10-CM | POA: Diagnosis not present

## 2017-04-14 DIAGNOSIS — F01518 Vascular dementia, unspecified severity, with other behavioral disturbance: Secondary | ICD-10-CM

## 2017-04-14 NOTE — Progress Notes (Signed)
Location:  [facility] Room Number: 30 Place of Service:  SNF (31) Provider:  Oneal Grout MD  Oneal Grout, MD  Patient Care Team: Oneal Grout, MD as PCP - General (Internal Medicine) Mast, Man X, NP as Nurse Practitioner (Nurse Practitioner) [place], [place] Lowella Bandy., MD as Consulting Physician (Urology) Rachael Fee, MD as Consulting Physician (Gastroenterology) Salvatore Marvel, MD as Consulting Physician (Orthopedic Surgery) Venancio Poisson, MD as Consulting Physician (Dermatology) Sheral Apley, MD as Attending Physician (Orthopedic Surgery)  Extended Emergency Contact Information Primary Emergency Contact: Cook,[name] Address: [address]          [place], [place] [address] [place] of [place] Home Phone: [phone] Work Phone: [phone] Mobile Phone: [phone] Relation: Son Secondary Emergency Contact: Santee,[name] Address: [address]          [place], [place] [address] [place] of [place] Home Phone: [phone] Relation: Relative  Code Status:  DNR Goals of care: Advanced Directive information Advanced Directives 04/14/2017  Does Patient Have a Medical Advance Directive? Yes  Type of Estate agent of [place];Living will;Out of facility DNR (pink MOST or yellow form)  Does patient want to make changes to medical advance directive? No - Patient declined  Copy of Healthcare Power of Attorney in Chart? Yes  Pre-existing out of facility DNR order (yellow form or pink MOST form) Pink MOST form placed in chart (order not valid for inpatient use);Yellow form placed in chart (order not valid for inpatient use)     Chief Complaint  Patient presents with  . Medical Management of Chronic Issues    Routine Visit     HPI:  Pt is a 83 y.o. female seen today for medical management of chronic diseases.  She is under hospice care and appears comfortable today. She is sitting on her geri  chair. She does not participate in HPI and ROS. She is under total care. No fall reported. No pressure ulcer reported. She has sustained skin tear to her left leg. Her mood and behavior has been stable with benzodiazepine. She takes her medications crushed and is on dysphagia diet. She needs 2 person assistance with transfer and is out of bed to geri chair daily.    Past Medical History:  Diagnosis Date  . Abnormality of gait 09/08/2003  . AF (atrial fibrillation) (HCC)   . Anxiety state, unspecified 08/19/2010  . Aortic stenosis   . Atrial fibrillation (HCC) 07/14/2009   Qualifier: Diagnosis of  By: [name], MD, Ruthann Cancer Ty Hilts   . Bradycardia   . Cardiac pacemaker in situ 09/12/2004  . Closed fracture of lumbar vertebra without mention of spinal cord injury [id number]  . Closed fracture of unspecified trochanteric section of femur 05/29/2011  . Constipation 03/14/2014  . Dementia 2013   04/03/14 MMSE 22/30   . Depression 04/10/2014  . Diffuse cystic mastopathy 12/23/2009  . Disturbance of salivary secretion [id number]  . Edema 08/18/2003  . Fall 12/19/2012   07/22/14 fall VS 144/76, 60, 20, 97.1. No apparent injury   . Flatulence, eructation, and gas pain 12/03/2009  . Fracture of greater trochanter of left femur (HCC) 03/08/14  . FTT (failure to thrive) in adult 12/15/2014  . Hearing loss   . Hemorrhoids   . Hip fracture (HCC)   . HTN (hypertension)   . Hypothyroidism 12/19/2007   Qualifier: Diagnosis of  By: Melvyn Neth CMA (AAMA), Patty  01/16/14 TSH 2.474 03/24/14 TSH 2.576    . Legal  blindness, as defined in [place] 03/20/1999   secondary to macular degeneration  . Loss of weight 10/13/2014   Multiple factorials: dementia, depression, advanced age, possible AR of medications(metformin and Oxybutynin)-will increase Mirtazapine and continue supplement. Observe.    . Macular degeneration (senile) of retina, unspecified 12/12/2001  . Macular degeneration of both eyes 01/24/2013   Severe visual impairment.  Nearly blind.   . Mitral valve prolapse   . Occlusion and stenosis of carotid artery without mention of cerebral infarction 12/27/2007  . Other and unspecified hyperlipidemia   . Other malaise and fatigue 12/06/2007  . Pacemaker-dual  medtronic 09/07/2011  . Personal history of fall 01/29/2009  . Rectal bleeding   . Senile osteoporosis 12/13/2000  . Sinus node dysfunction (HCC)   . Syncope and collapse 12/27/2007  . Trochanteric fracture of left femur (HCC) 03/14/2014   CT scan did however reveal acute fracture in greater left trochanteric. Dr. Margarita Rana consulted. Recommended nonoperative management and weightbearing as tolerated and another 3 weeks of Lovenox for DVT prophylaxis.       . Type 2 diabetes mellitus with blindness, with macular edema, with severe nonproliferative retinopathy 12/19/2007   Qualifier: Diagnosis of  By: Melvyn Neth CMA (AAMA), Patty  01/09/14 Hgb A1c 6.5 01/16/14 Hgb A1c 6.5 03/24/14 Hgb 6.4 12/10/14 pharm: dc Metformin and CBG monitoring-failure to thrive and refusing to eat.     Marland Kitchen Unspecified hereditary and idiopathic peripheral neuropathy 10/28/202012  . Unspecified hypothyroidism 03/18/2010  . Unspecified urinary incontinence 02/22/2005  . Urinary tract infection, site not specified 04/10/2014  . Xerophthalmus 01/21/2015   Past Surgical History:  Procedure Laterality Date  . BREAST BIOPSY Left 1970   benign  . Left hip surgery  09/2005  . PACEMAKER INSERTION  2006   Medtronic In-Pulse  . Right hip surgery  08/2003  . TONSILLECTOMY    . 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 Codeine     unknown  . Ibuprofen     unknown  . Lisinopril     unknown  . Penicillins     unknown  . Sanctura [Trospium Chloride]     unknown    Outpatient Encounter Prescriptions as of 04/14/2017  Medication Sig  . acetaminophen (TYLENOL) 325 MG tablet Take 650 mg by mouth every 4  (four) hours as needed for mild pain or moderate pain.   Marland Kitchen atropine 1 % ophthalmic solution every 2 (two) hours as needed. Place 4 drops on tongue  for excess secretions  . bisacodyl (DULCOLAX) 10 MG suppository Place 10 mg rectally daily as needed for moderate constipation.   Marland Kitchen escitalopram (LEXAPRO) 10 MG tablet Take 15 mg by mouth daily.   Marland Kitchen LORazepam (ATIVAN) 1 MG tablet Take 1 mg by mouth 2 (two) times daily.   Marland Kitchen morphine 20 MG/5ML solution Take by mouth every 2 (two) hours as needed for pain. Give 0.25ml  . sennosides-docusate sodium (SENOKOT-S) 8.6-50 MG tablet Take 2 tablets by mouth at bedtime. Reported on 12/11/2015   No facility-administered encounter medications on file as of 04/14/2017.     Review of Systems  Reason unable to perform ROS: with her advanced dementia.    Immunization History  Administered Date(s) Administered  . Influenza-Unspecified 06/06/2013, 04/21/2015, 05/17/2016  . PPD Test 07/23/2011  . Pneumococcal Polysaccharide-23 03/14/2009  . Td 03/14/2009  . Zoster 06/29/2006   Pertinent  Health Maintenance Due  Topic Date Due  . OPHTHALMOLOGY EXAM  07/28/1927  . DEXA SCAN  07/27/1982  . INFLUENZA VACCINE  05/01/2017 (Originally 03/01/2017)  . PNA vac Low Risk Adult (2 of 2 - PCV13) 06/01/2017 (Originally 03/14/2010)  . HEMOGLOBIN A1C  06/08/2017  . FOOT EXAM  12/05/2017  . URINE MICROALBUMIN  12/06/2017   Fall Risk  03/30/2017 01/26/2015 03/06/2014  Falls in the past year? No Yes Yes  Number falls in past yr: - 1 1  Comment - - 03/06/14  Injury with Fall? - Yes Yes  Comment - - left hip   Risk Factor Category  - - High Fall Risk  Risk for fall due to : - History of fall(s);Impaired balance/gait;Impaired mobility -  Follow up - Falls evaluation completed -   Functional Status Survey:    Vitals:   04/14/17 1242  BP: 124/80  Pulse: 60  Resp: 18  Temp: 97.6 F (36.4 C)  TempSrc: Oral  Weight: 89 lb 8 oz (40.6 kg)  Height:  (1.575 m)   Body mass  index is 16.37 kg/m.   Wt Readings from Last 3 Encounters:  04/14/17 89 lb 8 oz (40.6 kg)  03/30/17 92 lb (41.7 kg)  03/13/17 91 lb 8 oz (41.5 kg)   Physical Exam  Constitutional: No distress.  Frail and thin built, elderly female patient  HENT:  Head: Normocephalic and atraumatic.  Legally blind  Eyes: Pupils are equal, round, and reactive to light.  Neck: Normal range of motion. Neck supple.  Cardiovascular: Normal rate and regular rhythm.   Murmur heard. Pacemaker   Pulmonary/Chest: Effort normal. No respiratory distress.  Abdominal: Soft. Bowel sounds are normal.  Musculoskeletal: She exhibits no edema.  Can move her upper extremities, contracture to her legs  Lymphadenopathy:    She has no cervical adenopathy.  Neurological:  Pleasantly confused  Skin: Skin is warm and dry. She is not diaphoretic.  Dressing to LLE. geri sleeves to her arms and legs    Labs reviewed:  Recent Labs  12/06/16  NA 142  K 3.9  BUN 24*  CREATININE 0.9    Recent Labs  12/06/16  AST 21  ALT 11  ALKPHOS 70   No results for input(s): WBC, NEUTROABS, HGB, HCT, MCV, PLT in the last 8760 hours. Lab Results  Component Value Date   TSH 3.55 01/27/2015   Lab Results  Component Value Date   HGBA1C 5.2 12/06/2016   Lab Results  Component Value Date   CHOL 140 12/20/2012   HDL 38 12/20/2012   LDLCALC 57 12/20/2012   TRIG 227 (A) 12/20/2012   CHOLHDL 5.0 12/22/2007    Significant Diagnostic Results in last 30 days:  No results found.   Assessment/Plan  Advanced dementia with behavioral disturbance Poor prognosis and decline anticipated. Followed by hospice services. Total care with ADLs for now. Supportive care. Currently on escitalopram 15 mg daily and 1 mg bid of lorazepam. Decrease escitalopram to 10 mg daily.   Chronic constipation Continue senokot s at bedtime and prn dulcolax suppository and monitor  Failure to thrive With poor po intake and weight loss. Comfort  care for now. Assist with feeding  Legally blind Supportive care. Fall preventions  Chronic pain With history of fall and fractures. Continue prn morphine  Severe protein calorie malnutrition Assist with feeding, dysphagia diet. decline anticipated with advanced dementia   Family/ staff Communication: reviewed care plan with patient's charge nurse.   Labs/tests ordered:  none  Blanchie Serve, MD Internal Medicine [facility] [address] [place], [place] [address] Cell Phone (Monday-Friday 8 am - 5 pm): [phone] On Call: [phone] and follow prompts after 5 pm and on weekends Office Phone: [phone] Office Fax: [phone]

## 2017-04-17 DIAGNOSIS — E46 Unspecified protein-calorie malnutrition: Secondary | ICD-10-CM | POA: Diagnosis not present

## 2017-04-17 DIAGNOSIS — F0151 Vascular dementia with behavioral disturbance: Secondary | ICD-10-CM | POA: Diagnosis not present

## 2017-04-17 DIAGNOSIS — R630 Anorexia: Secondary | ICD-10-CM | POA: Diagnosis not present

## 2017-04-17 DIAGNOSIS — R634 Abnormal weight loss: Secondary | ICD-10-CM | POA: Diagnosis not present

## 2017-04-17 DIAGNOSIS — I4891 Unspecified atrial fibrillation: Secondary | ICD-10-CM | POA: Diagnosis not present

## 2017-04-17 DIAGNOSIS — I495 Sick sinus syndrome: Secondary | ICD-10-CM | POA: Diagnosis not present

## 2017-04-18 DIAGNOSIS — E46 Unspecified protein-calorie malnutrition: Secondary | ICD-10-CM | POA: Diagnosis not present

## 2017-04-18 DIAGNOSIS — I495 Sick sinus syndrome: Secondary | ICD-10-CM | POA: Diagnosis not present

## 2017-04-18 DIAGNOSIS — I4891 Unspecified atrial fibrillation: Secondary | ICD-10-CM | POA: Diagnosis not present

## 2017-04-18 DIAGNOSIS — R630 Anorexia: Secondary | ICD-10-CM | POA: Diagnosis not present

## 2017-04-18 DIAGNOSIS — F0151 Vascular dementia with behavioral disturbance: Secondary | ICD-10-CM | POA: Diagnosis not present

## 2017-04-18 DIAGNOSIS — R634 Abnormal weight loss: Secondary | ICD-10-CM | POA: Diagnosis not present

## 2017-04-19 DIAGNOSIS — I4891 Unspecified atrial fibrillation: Secondary | ICD-10-CM | POA: Diagnosis not present

## 2017-04-19 DIAGNOSIS — F0151 Vascular dementia with behavioral disturbance: Secondary | ICD-10-CM | POA: Diagnosis not present

## 2017-04-19 DIAGNOSIS — I495 Sick sinus syndrome: Secondary | ICD-10-CM | POA: Diagnosis not present

## 2017-04-19 DIAGNOSIS — R630 Anorexia: Secondary | ICD-10-CM | POA: Diagnosis not present

## 2017-04-19 DIAGNOSIS — R634 Abnormal weight loss: Secondary | ICD-10-CM | POA: Diagnosis not present

## 2017-04-19 DIAGNOSIS — E46 Unspecified protein-calorie malnutrition: Secondary | ICD-10-CM | POA: Diagnosis not present

## 2017-04-20 DIAGNOSIS — E46 Unspecified protein-calorie malnutrition: Secondary | ICD-10-CM | POA: Diagnosis not present

## 2017-04-20 DIAGNOSIS — I4891 Unspecified atrial fibrillation: Secondary | ICD-10-CM | POA: Diagnosis not present

## 2017-04-20 DIAGNOSIS — R634 Abnormal weight loss: Secondary | ICD-10-CM | POA: Diagnosis not present

## 2017-04-20 DIAGNOSIS — F0151 Vascular dementia with behavioral disturbance: Secondary | ICD-10-CM | POA: Diagnosis not present

## 2017-04-20 DIAGNOSIS — R630 Anorexia: Secondary | ICD-10-CM | POA: Diagnosis not present

## 2017-04-20 DIAGNOSIS — I495 Sick sinus syndrome: Secondary | ICD-10-CM | POA: Diagnosis not present

## 2017-04-21 DIAGNOSIS — R630 Anorexia: Secondary | ICD-10-CM | POA: Diagnosis not present

## 2017-04-21 DIAGNOSIS — R634 Abnormal weight loss: Secondary | ICD-10-CM | POA: Diagnosis not present

## 2017-04-21 DIAGNOSIS — F0151 Vascular dementia with behavioral disturbance: Secondary | ICD-10-CM | POA: Diagnosis not present

## 2017-04-21 DIAGNOSIS — F01518 Vascular dementia, unspecified severity, with other behavioral disturbance: Secondary | ICD-10-CM | POA: Insufficient documentation

## 2017-04-21 DIAGNOSIS — I495 Sick sinus syndrome: Secondary | ICD-10-CM | POA: Diagnosis not present

## 2017-04-21 DIAGNOSIS — I4891 Unspecified atrial fibrillation: Secondary | ICD-10-CM | POA: Diagnosis not present

## 2017-04-21 DIAGNOSIS — E46 Unspecified protein-calorie malnutrition: Secondary | ICD-10-CM | POA: Diagnosis not present

## 2017-04-24 DIAGNOSIS — I4891 Unspecified atrial fibrillation: Secondary | ICD-10-CM | POA: Diagnosis not present

## 2017-04-24 DIAGNOSIS — E46 Unspecified protein-calorie malnutrition: Secondary | ICD-10-CM | POA: Diagnosis not present

## 2017-04-24 DIAGNOSIS — R634 Abnormal weight loss: Secondary | ICD-10-CM | POA: Diagnosis not present

## 2017-04-24 DIAGNOSIS — I495 Sick sinus syndrome: Secondary | ICD-10-CM | POA: Diagnosis not present

## 2017-04-24 DIAGNOSIS — F0151 Vascular dementia with behavioral disturbance: Secondary | ICD-10-CM | POA: Diagnosis not present

## 2017-04-24 DIAGNOSIS — R630 Anorexia: Secondary | ICD-10-CM | POA: Diagnosis not present

## 2017-04-25 ENCOUNTER — Other Ambulatory Visit: Payer: Self-pay

## 2017-04-25 DIAGNOSIS — I495 Sick sinus syndrome: Secondary | ICD-10-CM | POA: Diagnosis not present

## 2017-04-25 DIAGNOSIS — R630 Anorexia: Secondary | ICD-10-CM | POA: Diagnosis not present

## 2017-04-25 DIAGNOSIS — I4891 Unspecified atrial fibrillation: Secondary | ICD-10-CM | POA: Diagnosis not present

## 2017-04-25 DIAGNOSIS — E46 Unspecified protein-calorie malnutrition: Secondary | ICD-10-CM | POA: Diagnosis not present

## 2017-04-25 DIAGNOSIS — R634 Abnormal weight loss: Secondary | ICD-10-CM | POA: Diagnosis not present

## 2017-04-25 DIAGNOSIS — F0151 Vascular dementia with behavioral disturbance: Secondary | ICD-10-CM | POA: Diagnosis not present

## 2017-04-25 MED ORDER — LORAZEPAM 1 MG PO TABS: 1.0000 mg | ORAL_TABLET | Freq: Two times a day (BID) | ORAL | 0 refills | Status: DC

## 2017-04-26 DIAGNOSIS — I495 Sick sinus syndrome: Secondary | ICD-10-CM | POA: Diagnosis not present

## 2017-04-26 DIAGNOSIS — I4891 Unspecified atrial fibrillation: Secondary | ICD-10-CM | POA: Diagnosis not present

## 2017-04-26 DIAGNOSIS — E46 Unspecified protein-calorie malnutrition: Secondary | ICD-10-CM | POA: Diagnosis not present

## 2017-04-26 DIAGNOSIS — R630 Anorexia: Secondary | ICD-10-CM | POA: Diagnosis not present

## 2017-04-26 DIAGNOSIS — F0151 Vascular dementia with behavioral disturbance: Secondary | ICD-10-CM | POA: Diagnosis not present

## 2017-04-26 DIAGNOSIS — R634 Abnormal weight loss: Secondary | ICD-10-CM | POA: Diagnosis not present

## 2017-04-27 DIAGNOSIS — E46 Unspecified protein-calorie malnutrition: Secondary | ICD-10-CM | POA: Diagnosis not present

## 2017-04-27 DIAGNOSIS — I495 Sick sinus syndrome: Secondary | ICD-10-CM | POA: Diagnosis not present

## 2017-04-27 DIAGNOSIS — F0151 Vascular dementia with behavioral disturbance: Secondary | ICD-10-CM | POA: Diagnosis not present

## 2017-04-27 DIAGNOSIS — I4891 Unspecified atrial fibrillation: Secondary | ICD-10-CM | POA: Diagnosis not present

## 2017-04-27 DIAGNOSIS — R634 Abnormal weight loss: Secondary | ICD-10-CM | POA: Diagnosis not present

## 2017-04-27 DIAGNOSIS — R630 Anorexia: Secondary | ICD-10-CM | POA: Diagnosis not present

## 2017-04-28 DIAGNOSIS — I4891 Unspecified atrial fibrillation: Secondary | ICD-10-CM | POA: Diagnosis not present

## 2017-04-28 DIAGNOSIS — R634 Abnormal weight loss: Secondary | ICD-10-CM | POA: Diagnosis not present

## 2017-04-28 DIAGNOSIS — R630 Anorexia: Secondary | ICD-10-CM | POA: Diagnosis not present

## 2017-04-28 DIAGNOSIS — I495 Sick sinus syndrome: Secondary | ICD-10-CM | POA: Diagnosis not present

## 2017-04-28 DIAGNOSIS — F0151 Vascular dementia with behavioral disturbance: Secondary | ICD-10-CM | POA: Diagnosis not present

## 2017-04-28 DIAGNOSIS — E46 Unspecified protein-calorie malnutrition: Secondary | ICD-10-CM | POA: Diagnosis not present

## 2017-05-01 DIAGNOSIS — I10 Essential (primary) hypertension: Secondary | ICD-10-CM | POA: Diagnosis not present

## 2017-05-01 DIAGNOSIS — R634 Abnormal weight loss: Secondary | ICD-10-CM | POA: Diagnosis not present

## 2017-05-01 DIAGNOSIS — F0151 Vascular dementia with behavioral disturbance: Secondary | ICD-10-CM | POA: Diagnosis not present

## 2017-05-01 DIAGNOSIS — E785 Hyperlipidemia, unspecified: Secondary | ICD-10-CM | POA: Diagnosis not present

## 2017-05-01 DIAGNOSIS — E1159 Type 2 diabetes mellitus with other circulatory complications: Secondary | ICD-10-CM | POA: Diagnosis not present

## 2017-05-01 DIAGNOSIS — E039 Hypothyroidism, unspecified: Secondary | ICD-10-CM | POA: Diagnosis not present

## 2017-05-01 DIAGNOSIS — E46 Unspecified protein-calorie malnutrition: Secondary | ICD-10-CM | POA: Diagnosis not present

## 2017-05-01 DIAGNOSIS — I495 Sick sinus syndrome: Secondary | ICD-10-CM | POA: Diagnosis not present

## 2017-05-01 DIAGNOSIS — F339 Major depressive disorder, recurrent, unspecified: Secondary | ICD-10-CM | POA: Diagnosis not present

## 2017-05-01 DIAGNOSIS — R630 Anorexia: Secondary | ICD-10-CM | POA: Diagnosis not present

## 2017-05-01 DIAGNOSIS — M84359S Stress fracture, hip, unspecified, sequela: Secondary | ICD-10-CM | POA: Diagnosis not present

## 2017-05-01 DIAGNOSIS — H3530 Unspecified macular degeneration: Secondary | ICD-10-CM | POA: Diagnosis not present

## 2017-05-01 DIAGNOSIS — I4891 Unspecified atrial fibrillation: Secondary | ICD-10-CM | POA: Diagnosis not present

## 2017-05-02 DIAGNOSIS — R634 Abnormal weight loss: Secondary | ICD-10-CM | POA: Diagnosis not present

## 2017-05-02 DIAGNOSIS — I4891 Unspecified atrial fibrillation: Secondary | ICD-10-CM | POA: Diagnosis not present

## 2017-05-02 DIAGNOSIS — R630 Anorexia: Secondary | ICD-10-CM | POA: Diagnosis not present

## 2017-05-02 DIAGNOSIS — I495 Sick sinus syndrome: Secondary | ICD-10-CM | POA: Diagnosis not present

## 2017-05-02 DIAGNOSIS — F0151 Vascular dementia with behavioral disturbance: Secondary | ICD-10-CM | POA: Diagnosis not present

## 2017-05-02 DIAGNOSIS — E46 Unspecified protein-calorie malnutrition: Secondary | ICD-10-CM | POA: Diagnosis not present

## 2017-05-03 DIAGNOSIS — R634 Abnormal weight loss: Secondary | ICD-10-CM | POA: Diagnosis not present

## 2017-05-03 DIAGNOSIS — E46 Unspecified protein-calorie malnutrition: Secondary | ICD-10-CM | POA: Diagnosis not present

## 2017-05-03 DIAGNOSIS — F0151 Vascular dementia with behavioral disturbance: Secondary | ICD-10-CM | POA: Diagnosis not present

## 2017-05-03 DIAGNOSIS — R630 Anorexia: Secondary | ICD-10-CM | POA: Diagnosis not present

## 2017-05-03 DIAGNOSIS — I495 Sick sinus syndrome: Secondary | ICD-10-CM | POA: Diagnosis not present

## 2017-05-03 DIAGNOSIS — I4891 Unspecified atrial fibrillation: Secondary | ICD-10-CM | POA: Diagnosis not present

## 2017-05-04 DIAGNOSIS — I4891 Unspecified atrial fibrillation: Secondary | ICD-10-CM | POA: Diagnosis not present

## 2017-05-04 DIAGNOSIS — R630 Anorexia: Secondary | ICD-10-CM | POA: Diagnosis not present

## 2017-05-04 DIAGNOSIS — I495 Sick sinus syndrome: Secondary | ICD-10-CM | POA: Diagnosis not present

## 2017-05-04 DIAGNOSIS — E46 Unspecified protein-calorie malnutrition: Secondary | ICD-10-CM | POA: Diagnosis not present

## 2017-05-04 DIAGNOSIS — F0151 Vascular dementia with behavioral disturbance: Secondary | ICD-10-CM | POA: Diagnosis not present

## 2017-05-04 DIAGNOSIS — R634 Abnormal weight loss: Secondary | ICD-10-CM | POA: Diagnosis not present

## 2017-05-05 DIAGNOSIS — I495 Sick sinus syndrome: Secondary | ICD-10-CM | POA: Diagnosis not present

## 2017-05-05 DIAGNOSIS — F0151 Vascular dementia with behavioral disturbance: Secondary | ICD-10-CM | POA: Diagnosis not present

## 2017-05-05 DIAGNOSIS — I4891 Unspecified atrial fibrillation: Secondary | ICD-10-CM | POA: Diagnosis not present

## 2017-05-05 DIAGNOSIS — R630 Anorexia: Secondary | ICD-10-CM | POA: Diagnosis not present

## 2017-05-05 DIAGNOSIS — R634 Abnormal weight loss: Secondary | ICD-10-CM | POA: Diagnosis not present

## 2017-05-05 DIAGNOSIS — E46 Unspecified protein-calorie malnutrition: Secondary | ICD-10-CM | POA: Diagnosis not present

## 2017-05-08 DIAGNOSIS — F0151 Vascular dementia with behavioral disturbance: Secondary | ICD-10-CM | POA: Diagnosis not present

## 2017-05-08 DIAGNOSIS — R630 Anorexia: Secondary | ICD-10-CM | POA: Diagnosis not present

## 2017-05-08 DIAGNOSIS — I495 Sick sinus syndrome: Secondary | ICD-10-CM | POA: Diagnosis not present

## 2017-05-08 DIAGNOSIS — E46 Unspecified protein-calorie malnutrition: Secondary | ICD-10-CM | POA: Diagnosis not present

## 2017-05-08 DIAGNOSIS — R634 Abnormal weight loss: Secondary | ICD-10-CM | POA: Diagnosis not present

## 2017-05-08 DIAGNOSIS — I4891 Unspecified atrial fibrillation: Secondary | ICD-10-CM | POA: Diagnosis not present

## 2017-05-09 DIAGNOSIS — R634 Abnormal weight loss: Secondary | ICD-10-CM | POA: Diagnosis not present

## 2017-05-09 DIAGNOSIS — I495 Sick sinus syndrome: Secondary | ICD-10-CM | POA: Diagnosis not present

## 2017-05-09 DIAGNOSIS — R630 Anorexia: Secondary | ICD-10-CM | POA: Diagnosis not present

## 2017-05-09 DIAGNOSIS — E46 Unspecified protein-calorie malnutrition: Secondary | ICD-10-CM | POA: Diagnosis not present

## 2017-05-09 DIAGNOSIS — I4891 Unspecified atrial fibrillation: Secondary | ICD-10-CM | POA: Diagnosis not present

## 2017-05-09 DIAGNOSIS — F0151 Vascular dementia with behavioral disturbance: Secondary | ICD-10-CM | POA: Diagnosis not present

## 2017-05-10 DIAGNOSIS — R634 Abnormal weight loss: Secondary | ICD-10-CM | POA: Diagnosis not present

## 2017-05-10 DIAGNOSIS — F0151 Vascular dementia with behavioral disturbance: Secondary | ICD-10-CM | POA: Diagnosis not present

## 2017-05-10 DIAGNOSIS — I495 Sick sinus syndrome: Secondary | ICD-10-CM | POA: Diagnosis not present

## 2017-05-10 DIAGNOSIS — R630 Anorexia: Secondary | ICD-10-CM | POA: Diagnosis not present

## 2017-05-10 DIAGNOSIS — E46 Unspecified protein-calorie malnutrition: Secondary | ICD-10-CM | POA: Diagnosis not present

## 2017-05-10 DIAGNOSIS — I4891 Unspecified atrial fibrillation: Secondary | ICD-10-CM | POA: Diagnosis not present

## 2017-05-12 DIAGNOSIS — E46 Unspecified protein-calorie malnutrition: Secondary | ICD-10-CM | POA: Diagnosis not present

## 2017-05-12 DIAGNOSIS — F0151 Vascular dementia with behavioral disturbance: Secondary | ICD-10-CM | POA: Diagnosis not present

## 2017-05-12 DIAGNOSIS — I4891 Unspecified atrial fibrillation: Secondary | ICD-10-CM | POA: Diagnosis not present

## 2017-05-12 DIAGNOSIS — I495 Sick sinus syndrome: Secondary | ICD-10-CM | POA: Diagnosis not present

## 2017-05-12 DIAGNOSIS — R634 Abnormal weight loss: Secondary | ICD-10-CM | POA: Diagnosis not present

## 2017-05-12 DIAGNOSIS — R630 Anorexia: Secondary | ICD-10-CM | POA: Diagnosis not present

## 2017-05-15 DIAGNOSIS — I4891 Unspecified atrial fibrillation: Secondary | ICD-10-CM | POA: Diagnosis not present

## 2017-05-15 DIAGNOSIS — E46 Unspecified protein-calorie malnutrition: Secondary | ICD-10-CM | POA: Diagnosis not present

## 2017-05-15 DIAGNOSIS — I495 Sick sinus syndrome: Secondary | ICD-10-CM | POA: Diagnosis not present

## 2017-05-15 DIAGNOSIS — F0151 Vascular dementia with behavioral disturbance: Secondary | ICD-10-CM | POA: Diagnosis not present

## 2017-05-15 DIAGNOSIS — R630 Anorexia: Secondary | ICD-10-CM | POA: Diagnosis not present

## 2017-05-15 DIAGNOSIS — R634 Abnormal weight loss: Secondary | ICD-10-CM | POA: Diagnosis not present

## 2017-05-17 ENCOUNTER — Non-Acute Institutional Stay (SKILLED_NURSING_FACILITY): Payer: Medicare Other | Admitting: Nurse Practitioner

## 2017-05-17 ENCOUNTER — Encounter: Payer: Self-pay | Admitting: Nurse Practitioner

## 2017-05-17 DIAGNOSIS — I4891 Unspecified atrial fibrillation: Secondary | ICD-10-CM | POA: Diagnosis not present

## 2017-05-17 DIAGNOSIS — R627 Adult failure to thrive: Secondary | ICD-10-CM

## 2017-05-17 DIAGNOSIS — G309 Alzheimer's disease, unspecified: Secondary | ICD-10-CM

## 2017-05-17 DIAGNOSIS — R634 Abnormal weight loss: Secondary | ICD-10-CM | POA: Diagnosis not present

## 2017-05-17 DIAGNOSIS — E46 Unspecified protein-calorie malnutrition: Secondary | ICD-10-CM | POA: Diagnosis not present

## 2017-05-17 DIAGNOSIS — F0151 Vascular dementia with behavioral disturbance: Secondary | ICD-10-CM | POA: Diagnosis not present

## 2017-05-17 DIAGNOSIS — F0281 Dementia in other diseases classified elsewhere with behavioral disturbance: Secondary | ICD-10-CM

## 2017-05-17 DIAGNOSIS — R630 Anorexia: Secondary | ICD-10-CM | POA: Diagnosis not present

## 2017-05-17 DIAGNOSIS — I495 Sick sinus syndrome: Secondary | ICD-10-CM | POA: Diagnosis not present

## 2017-05-17 NOTE — Assessment & Plan Note (Signed)
The patient has history of dementia, under Hospice service for comfort measures, her mood is managed with Lorazepam bid and Citalopram 15 mg daily. She has emotional outburst occasionally but usually it can be redirected.

## 2017-05-17 NOTE — Assessment & Plan Note (Signed)
Prn Morphine, Tylenol, Atropine drops available to her for comfort purpose. Continue nutritional supplement 3x /day.

## 2017-05-17 NOTE — Progress Notes (Signed)
Location:  [facility] Room Number: 30 Place of Service:  SNF (31) Provider:  Stephfon Bovey, Manxie  NP  Oneal Grout, MD  Patient Care Team: Oneal Grout, MD as PCP - General (Internal Medicine) Dalexa Gentz X, NP as Nurse Practitioner (Nurse Practitioner) [place] [place], [place] Lowella Bandy., MD as Consulting Physician (Urology) Rachael Fee, MD as Consulting Physician (Gastroenterology) Salvatore Marvel, MD as Consulting Physician (Orthopedic Surgery) Venancio Poisson, MD as Consulting Physician (Dermatology) Sheral Apley, MD as Attending Physician (Orthopedic Surgery)  Extended Emergency Contact Information Primary Emergency Contact: Cook,[name] Address: [address]          [place], [place] [address] [place] Home Phone: [phone] Work Phone: [phone] Mobile Phone: [phone] Relation: Son Secondary Emergency Contact: Hinojos,[name] Address: [address]          [place], [place] [address] [place] Home Phone: [phone] Relation: Relative  Code Status:  DNR Goals of care: Advanced Directive information Advanced Directives 05/17/2017  Does Patient Have a Medical Advance Directive? Yes  Type of Estate agent of [place];Living will;Out of facility DNR (pink MOST or yellow form)  Does patient want to make changes to medical advance directive? No - Patient declined  Copy of Healthcare Power of Attorney in Chart? Yes  Pre-existing out of facility DNR order (yellow form or pink MOST form) Yellow form placed in chart (order not valid for inpatient use);Pink MOST form placed in chart (order not valid for inpatient use)     Chief Complaint  Patient presents with  . Medical Management of Chronic Issues    HPI:  Pt is a 83 y.o. female seen today for medical management of chronic diseases.     The patient has history of dementia, under Hospice service for comfort measures, her mood is managed  with Lorazepam bid and Citalopram 15 mg daily. She has emotional outburst occasionally but usually it can be redirected. Prn Morphine, Tylenol, Atropine drops available to her for comfort purpose.    Past Medical History:  Diagnosis Date  . Abnormality of gait 09/08/2003  . AF (atrial fibrillation) (HCC)   . Anxiety state, unspecified 08/19/2010  . Aortic stenosis   . Atrial fibrillation (HCC) 07/14/2009   Qualifier: Diagnosis of  By: [name], MD, Ruthann Cancer Ty Hilts   . Bradycardia   . Cardiac pacemaker in situ 09/12/2004  . Closed fracture of lumbar vertebra without mention of spinal cord injury 110/28/202010  . Closed fracture of unspecified trochanteric section of femur 05/29/2011  . Constipation 03/14/2014  . Dementia 2013   04/03/14 MMSE 22/30   . Depression 04/10/2014  . Diffuse cystic mastopathy 12/23/2009  . Disturbance of salivary secretion [id number]  . Edema 08/18/2003  . Fall 12/19/2012   07/22/14 fall VS 144/76, 60, 20, 97.1. No apparent injury   . Flatulence, eructation, and gas pain 12/03/2009  . Fracture of greater trochanter of left femur (HCC) 03/08/14  . FTT (failure to thrive) in adult 12/15/2014  . Hearing loss   . Hemorrhoids   . Hip fracture (HCC)   . HTN (hypertension)   . Hypothyroidism 12/19/2007   Qualifier: Diagnosis of  By: Melvyn Neth CMA (AAMA), Patty  01/16/14 TSH 2.474 03/24/14 TSH 2.576    . 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 Loss of weight 10/13/2014   Multiple factorials: dementia, depression, advanced age, possible AR of medications(metformin and Oxybutynin)-will increase Mirtazapine and continue  supplement. Observe.    . Macular degeneration (senile) of retina, unspecified 12/12/2001  . Macular degeneration of both eyes 01/24/2013   Severe visual impairment. Nearly blind.   . Mitral valve prolapse   . Occlusion and stenosis of carotid artery without mention of cerebral infarction 12/27/2007  . Other and unspecified  hyperlipidemia   . Other malaise and fatigue 12/06/2007  . Pacemaker-dual  medtronic 09/07/2011  . Personal history of fall 01/29/2009  . Rectal bleeding   . Senile osteoporosis 12/13/2000  . Sinus node dysfunction (HCC)   . Syncope and collapse 12/27/2007  . Trochanteric fracture of left femur (HCC) 03/14/2014   CT scan did however reveal acute fracture in greater left trochanteric. Dr. Margarita Rana consulted. Recommended nonoperative management and weightbearing as tolerated and another 3 weeks of Lovenox for DVT prophylaxis.       . Type 2 diabetes mellitus with blindness, with macular edema, with severe nonproliferative retinopathy 12/19/2007   Qualifier: Diagnosis of  By: Melvyn Neth CMA (AAMA), Patty  01/09/14 Hgb A1c 6.5 01/16/14 Hgb A1c 6.5 03/24/14 Hgb 6.4 12/10/14 pharm: dc Metformin and CBG monitoring-failure to thrive and refusing to eat.     Marland Kitchen Unspecified hereditary and idiopathic peripheral neuropathy 10/28/202012  . Unspecified hypothyroidism 03/18/2010  . Unspecified urinary incontinence 02/22/2005  . Urinary tract infection, site not specified 04/10/2014  . Xerophthalmus 01/21/2015   Past Surgical History:  Procedure Laterality Date  . BREAST BIOPSY Left 1970   benign  . Left hip surgery  09/2005  . PACEMAKER INSERTION  2006   Medtronic In-Pulse  . Right hip surgery  08/2003  . TONSILLECTOMY    . 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 Codeine     unknown  . Ibuprofen     unknown  . Lisinopril     unknown  . Penicillins     unknown  . Sanctura [Trospium Chloride]     unknown    Outpatient Encounter Prescriptions as of 05/17/2017  Medication Sig  . acetaminophen (TYLENOL) 325 MG tablet Take 650 mg by mouth every 4 (four) hours as needed for mild pain or moderate pain.   Marland Kitchen atropine 1 % ophthalmic solution every 2 (two) hours as needed. Place 4 drops on tongue  for excess  secretions  . bisacodyl (DULCOLAX) 10 MG suppository Place 10 mg rectally daily as needed for moderate constipation.   . diphenoxylate-atropine (LOMOTIL) 2.5-0.025 MG/5ML liquid Take by mouth every 2 (two) hours as needed. 4 drops under the tongue as needed for excessive secretions  . escitalopram (LEXAPRO) 10 MG tablet Take 15 mg by mouth daily.   Marland Kitchen LORazepam (ATIVAN) 1 MG tablet Take 1 tablet (1 mg total) by mouth 2 (two) times daily.  Marland Kitchen morphine 20 MG/5ML solution Take by mouth every 2 (two) hours as needed for pain. Give 0.47ml, sublingual  . sennosides-docusate sodium (SENOKOT-S) 8.6-50 MG tablet Take 2 tablets by mouth at bedtime. Reported on 12/11/2015   No facility-administered encounter medications on file as of 05/17/2017.    ROS was provided with assistance of staff Review of Systems  Constitutional: Negative for activity change, appetite change, chills, diaphoresis, fatigue and fever.       Thin, frail  HENT: Positive for hearing loss. Negative for congestion, trouble swallowing and voice change.   Eyes: Negative for pain and discharge.       Legally blind.  Respiratory: Negative for cough, choking, chest tightness and shortness of breath.   Cardiovascular: Negative for chest pain, palpitations and leg swelling.  Gastrointestinal: Negative for abdominal distention, abdominal pain, constipation, diarrhea, nausea and vomiting.       Incontinent of bowel.   Genitourinary: Negative for dysuria.       Incontinent of bladder.   Musculoskeletal: Positive for gait problem.       Non ambulatory.   Skin: Positive for wound. Negative for color change, pallor and rash.       Right leg skin tear, well approximated with steri-strips, no sign of infection.   Neurological: Negative for weakness.       Dementia, no sensible speech.   Psychiatric/Behavioral: Positive for agitation, behavioral problems and confusion. Negative for sleep disturbance.       Occasionally     Immunization  History  Administered Date(s) Administered  . Influenza-Unspecified 06/06/2013, 04/21/2015, 05/17/2016  . PPD Test 07/23/2011  . Pneumococcal Polysaccharide-23 03/14/2009  . Td 03/14/2009  . Zoster 06/29/2006   Pertinent  Health Maintenance Due  Topic Date Due  . OPHTHALMOLOGY EXAM  07/28/1927  . DEXA SCAN  07/27/1982  . INFLUENZA VACCINE  03/01/2017  . PNA vac Low Risk Adult (2 of 2 - PCV13) 06/01/2017 (Originally 03/14/2010)  . HEMOGLOBIN A1C  06/08/2017  . FOOT EXAM  12/05/2017  . URINE MICROALBUMIN  12/06/2017   Fall Risk  03/30/2017 01/26/2015 03/06/2014  Falls in the past year? No Yes Yes  Number falls in past yr: - 1 1  Comment - - 03/06/14  Injury with Fall? - Yes Yes  Comment - - left hip   Risk Factor Category  - - High Fall Risk  Risk for fall due to : - History of fall(s);Impaired balance/gait;Impaired mobility -  Follow up - Falls evaluation completed -   Functional Status Survey:    Vitals:   05/17/17 1228  BP: 132/60  Pulse: 68  Resp: 18  Temp: 97.8 F (36.6 C)  Weight: 85 lb 12.8 oz (38.9 kg)  Height: 5\' 2"  (1.575 m)   Body mass index is 15.69 kg/m. Physical Exam  Constitutional: She appears well-developed and well-nourished. No distress.  HENT:  Head: Normocephalic and atraumatic.  Eyes: Pupils are equal, round, and reactive to light. Conjunctivae are normal.  Neck: Normal range of motion. Neck supple.  Cardiovascular: Normal rate and regular rhythm.   No murmur heard. Pulmonary/Chest: She has no wheezes. She has no rales.  Abdominal: Soft. Bowel sounds are normal. She exhibits no distension. There is no tenderness.  Musculoskeletal: She exhibits no edema or tenderness.  Neurological: She is alert. She exhibits normal muscle tone.  Oriented to self.   Skin: Skin is warm and dry. No rash noted. She is not diaphoretic. No erythema. No pallor.  Right leg skin tear with steri strip closures, no s/s of infection.   Psychiatric: She has a normal mood and  affect.  Agitation at times.     Labs reviewed:  Recent Labs  12/06/16  NA 142  K 3.9  BUN 24*  CREATININE 0.9    Recent Labs  12/06/16  AST 21  ALT 11  ALKPHOS 70   No results for input(s): WBC, NEUTROABS, HGB, HCT, MCV, PLT in the last 8760 hours. Lab Results  Component Value Date   TSH 3.55 01/27/2015   Lab Results  Component Value Date   HGBA1C 5.2 12/06/2016   Lab Results  Component Value Date  CHOL 140 12/20/2012   HDL 38 12/20/2012   LDLCALC 57 12/20/2012   TRIG 227 (A) 12/20/2012   CHOLHDL 5.0 12/22/2007    Significant Diagnostic Results in last 30 days:  No results found.  Assessment/Plan Alzheimer's dementia with behavioral disturbance The patient has history of dementia, under Hospice service for comfort measures, her mood is managed with Lorazepam bid and Citalopram 15 mg daily. She has emotional outburst occasionally but usually it can be redirected.  FTT (failure to thrive) in adult Prn Morphine, Tylenol, Atropine drops available to her for comfort purpose. Continue nutritional supplement 3x /day.        Family/ staff Communication: plan of care reviewed with the patient and charge nurse  Labs/tests ordered:  None  Time spend 25 minutes

## 2017-05-18 DIAGNOSIS — E46 Unspecified protein-calorie malnutrition: Secondary | ICD-10-CM | POA: Diagnosis not present

## 2017-05-18 DIAGNOSIS — R630 Anorexia: Secondary | ICD-10-CM | POA: Diagnosis not present

## 2017-05-18 DIAGNOSIS — I495 Sick sinus syndrome: Secondary | ICD-10-CM | POA: Diagnosis not present

## 2017-05-18 DIAGNOSIS — F0151 Vascular dementia with behavioral disturbance: Secondary | ICD-10-CM | POA: Diagnosis not present

## 2017-05-18 DIAGNOSIS — R634 Abnormal weight loss: Secondary | ICD-10-CM | POA: Diagnosis not present

## 2017-05-18 DIAGNOSIS — I4891 Unspecified atrial fibrillation: Secondary | ICD-10-CM | POA: Diagnosis not present

## 2017-05-19 DIAGNOSIS — I4891 Unspecified atrial fibrillation: Secondary | ICD-10-CM | POA: Diagnosis not present

## 2017-05-19 DIAGNOSIS — E46 Unspecified protein-calorie malnutrition: Secondary | ICD-10-CM | POA: Diagnosis not present

## 2017-05-19 DIAGNOSIS — R630 Anorexia: Secondary | ICD-10-CM | POA: Diagnosis not present

## 2017-05-19 DIAGNOSIS — R634 Abnormal weight loss: Secondary | ICD-10-CM | POA: Diagnosis not present

## 2017-05-19 DIAGNOSIS — I495 Sick sinus syndrome: Secondary | ICD-10-CM | POA: Diagnosis not present

## 2017-05-19 DIAGNOSIS — F0151 Vascular dementia with behavioral disturbance: Secondary | ICD-10-CM | POA: Diagnosis not present

## 2017-05-22 DIAGNOSIS — R630 Anorexia: Secondary | ICD-10-CM | POA: Diagnosis not present

## 2017-05-22 DIAGNOSIS — I495 Sick sinus syndrome: Secondary | ICD-10-CM | POA: Diagnosis not present

## 2017-05-22 DIAGNOSIS — F0151 Vascular dementia with behavioral disturbance: Secondary | ICD-10-CM | POA: Diagnosis not present

## 2017-05-22 DIAGNOSIS — I4891 Unspecified atrial fibrillation: Secondary | ICD-10-CM | POA: Diagnosis not present

## 2017-05-22 DIAGNOSIS — E46 Unspecified protein-calorie malnutrition: Secondary | ICD-10-CM | POA: Diagnosis not present

## 2017-05-22 DIAGNOSIS — R634 Abnormal weight loss: Secondary | ICD-10-CM | POA: Diagnosis not present

## 2017-05-24 DIAGNOSIS — I4891 Unspecified atrial fibrillation: Secondary | ICD-10-CM | POA: Diagnosis not present

## 2017-05-24 DIAGNOSIS — R634 Abnormal weight loss: Secondary | ICD-10-CM | POA: Diagnosis not present

## 2017-05-24 DIAGNOSIS — F0151 Vascular dementia with behavioral disturbance: Secondary | ICD-10-CM | POA: Diagnosis not present

## 2017-05-24 DIAGNOSIS — I495 Sick sinus syndrome: Secondary | ICD-10-CM | POA: Diagnosis not present

## 2017-05-24 DIAGNOSIS — E46 Unspecified protein-calorie malnutrition: Secondary | ICD-10-CM | POA: Diagnosis not present

## 2017-05-24 DIAGNOSIS — R630 Anorexia: Secondary | ICD-10-CM | POA: Diagnosis not present

## 2017-05-26 DIAGNOSIS — R634 Abnormal weight loss: Secondary | ICD-10-CM | POA: Diagnosis not present

## 2017-05-26 DIAGNOSIS — I4891 Unspecified atrial fibrillation: Secondary | ICD-10-CM | POA: Diagnosis not present

## 2017-05-26 DIAGNOSIS — I495 Sick sinus syndrome: Secondary | ICD-10-CM | POA: Diagnosis not present

## 2017-05-26 DIAGNOSIS — E46 Unspecified protein-calorie malnutrition: Secondary | ICD-10-CM | POA: Diagnosis not present

## 2017-05-26 DIAGNOSIS — R630 Anorexia: Secondary | ICD-10-CM | POA: Diagnosis not present

## 2017-05-26 DIAGNOSIS — F0151 Vascular dementia with behavioral disturbance: Secondary | ICD-10-CM | POA: Diagnosis not present

## 2017-05-29 DIAGNOSIS — R634 Abnormal weight loss: Secondary | ICD-10-CM | POA: Diagnosis not present

## 2017-05-29 DIAGNOSIS — E46 Unspecified protein-calorie malnutrition: Secondary | ICD-10-CM | POA: Diagnosis not present

## 2017-05-29 DIAGNOSIS — R630 Anorexia: Secondary | ICD-10-CM | POA: Diagnosis not present

## 2017-05-29 DIAGNOSIS — I4891 Unspecified atrial fibrillation: Secondary | ICD-10-CM | POA: Diagnosis not present

## 2017-05-29 DIAGNOSIS — I495 Sick sinus syndrome: Secondary | ICD-10-CM | POA: Diagnosis not present

## 2017-05-29 DIAGNOSIS — F0151 Vascular dementia with behavioral disturbance: Secondary | ICD-10-CM | POA: Diagnosis not present

## 2017-05-30 DIAGNOSIS — F0151 Vascular dementia with behavioral disturbance: Secondary | ICD-10-CM | POA: Diagnosis not present

## 2017-05-30 DIAGNOSIS — I4891 Unspecified atrial fibrillation: Secondary | ICD-10-CM | POA: Diagnosis not present

## 2017-05-30 DIAGNOSIS — I495 Sick sinus syndrome: Secondary | ICD-10-CM | POA: Diagnosis not present

## 2017-05-30 DIAGNOSIS — R630 Anorexia: Secondary | ICD-10-CM | POA: Diagnosis not present

## 2017-05-30 DIAGNOSIS — E46 Unspecified protein-calorie malnutrition: Secondary | ICD-10-CM | POA: Diagnosis not present

## 2017-05-30 DIAGNOSIS — R634 Abnormal weight loss: Secondary | ICD-10-CM | POA: Diagnosis not present

## 2017-05-31 DIAGNOSIS — R634 Abnormal weight loss: Secondary | ICD-10-CM | POA: Diagnosis not present

## 2017-05-31 DIAGNOSIS — E46 Unspecified protein-calorie malnutrition: Secondary | ICD-10-CM | POA: Diagnosis not present

## 2017-05-31 DIAGNOSIS — R630 Anorexia: Secondary | ICD-10-CM | POA: Diagnosis not present

## 2017-05-31 DIAGNOSIS — F0151 Vascular dementia with behavioral disturbance: Secondary | ICD-10-CM | POA: Diagnosis not present

## 2017-05-31 DIAGNOSIS — I4891 Unspecified atrial fibrillation: Secondary | ICD-10-CM | POA: Diagnosis not present

## 2017-05-31 DIAGNOSIS — I495 Sick sinus syndrome: Secondary | ICD-10-CM | POA: Diagnosis not present

## 2017-06-01 DIAGNOSIS — H3530 Unspecified macular degeneration: Secondary | ICD-10-CM | POA: Diagnosis not present

## 2017-06-01 DIAGNOSIS — F0151 Vascular dementia with behavioral disturbance: Secondary | ICD-10-CM | POA: Diagnosis not present

## 2017-06-01 DIAGNOSIS — I4891 Unspecified atrial fibrillation: Secondary | ICD-10-CM | POA: Diagnosis not present

## 2017-06-01 DIAGNOSIS — I495 Sick sinus syndrome: Secondary | ICD-10-CM | POA: Diagnosis not present

## 2017-06-01 DIAGNOSIS — I10 Essential (primary) hypertension: Secondary | ICD-10-CM | POA: Diagnosis not present

## 2017-06-01 DIAGNOSIS — R630 Anorexia: Secondary | ICD-10-CM | POA: Diagnosis not present

## 2017-06-01 DIAGNOSIS — E46 Unspecified protein-calorie malnutrition: Secondary | ICD-10-CM | POA: Diagnosis not present

## 2017-06-01 DIAGNOSIS — F339 Major depressive disorder, recurrent, unspecified: Secondary | ICD-10-CM | POA: Diagnosis not present

## 2017-06-01 DIAGNOSIS — M84359S Stress fracture, hip, unspecified, sequela: Secondary | ICD-10-CM | POA: Diagnosis not present

## 2017-06-01 DIAGNOSIS — R634 Abnormal weight loss: Secondary | ICD-10-CM | POA: Diagnosis not present

## 2017-06-01 DIAGNOSIS — E039 Hypothyroidism, unspecified: Secondary | ICD-10-CM | POA: Diagnosis not present

## 2017-06-01 DIAGNOSIS — E785 Hyperlipidemia, unspecified: Secondary | ICD-10-CM | POA: Diagnosis not present

## 2017-06-01 DIAGNOSIS — E1159 Type 2 diabetes mellitus with other circulatory complications: Secondary | ICD-10-CM | POA: Diagnosis not present

## 2017-06-02 DIAGNOSIS — E46 Unspecified protein-calorie malnutrition: Secondary | ICD-10-CM | POA: Diagnosis not present

## 2017-06-02 DIAGNOSIS — I495 Sick sinus syndrome: Secondary | ICD-10-CM | POA: Diagnosis not present

## 2017-06-02 DIAGNOSIS — R634 Abnormal weight loss: Secondary | ICD-10-CM | POA: Diagnosis not present

## 2017-06-02 DIAGNOSIS — R630 Anorexia: Secondary | ICD-10-CM | POA: Diagnosis not present

## 2017-06-02 DIAGNOSIS — I4891 Unspecified atrial fibrillation: Secondary | ICD-10-CM | POA: Diagnosis not present

## 2017-06-02 DIAGNOSIS — F0151 Vascular dementia with behavioral disturbance: Secondary | ICD-10-CM | POA: Diagnosis not present

## 2017-06-05 DIAGNOSIS — R634 Abnormal weight loss: Secondary | ICD-10-CM | POA: Diagnosis not present

## 2017-06-05 DIAGNOSIS — I4891 Unspecified atrial fibrillation: Secondary | ICD-10-CM | POA: Diagnosis not present

## 2017-06-05 DIAGNOSIS — E46 Unspecified protein-calorie malnutrition: Secondary | ICD-10-CM | POA: Diagnosis not present

## 2017-06-05 DIAGNOSIS — R630 Anorexia: Secondary | ICD-10-CM | POA: Diagnosis not present

## 2017-06-05 DIAGNOSIS — F0151 Vascular dementia with behavioral disturbance: Secondary | ICD-10-CM | POA: Diagnosis not present

## 2017-06-05 DIAGNOSIS — I495 Sick sinus syndrome: Secondary | ICD-10-CM | POA: Diagnosis not present

## 2017-06-06 DIAGNOSIS — I4891 Unspecified atrial fibrillation: Secondary | ICD-10-CM | POA: Diagnosis not present

## 2017-06-06 DIAGNOSIS — E46 Unspecified protein-calorie malnutrition: Secondary | ICD-10-CM | POA: Diagnosis not present

## 2017-06-06 DIAGNOSIS — R634 Abnormal weight loss: Secondary | ICD-10-CM | POA: Diagnosis not present

## 2017-06-06 DIAGNOSIS — R630 Anorexia: Secondary | ICD-10-CM | POA: Diagnosis not present

## 2017-06-06 DIAGNOSIS — F0151 Vascular dementia with behavioral disturbance: Secondary | ICD-10-CM | POA: Diagnosis not present

## 2017-06-06 DIAGNOSIS — I495 Sick sinus syndrome: Secondary | ICD-10-CM | POA: Diagnosis not present

## 2017-06-07 DIAGNOSIS — R630 Anorexia: Secondary | ICD-10-CM | POA: Diagnosis not present

## 2017-06-07 DIAGNOSIS — R634 Abnormal weight loss: Secondary | ICD-10-CM | POA: Diagnosis not present

## 2017-06-07 DIAGNOSIS — E46 Unspecified protein-calorie malnutrition: Secondary | ICD-10-CM | POA: Diagnosis not present

## 2017-06-07 DIAGNOSIS — I4891 Unspecified atrial fibrillation: Secondary | ICD-10-CM | POA: Diagnosis not present

## 2017-06-07 DIAGNOSIS — F0151 Vascular dementia with behavioral disturbance: Secondary | ICD-10-CM | POA: Diagnosis not present

## 2017-06-07 DIAGNOSIS — I495 Sick sinus syndrome: Secondary | ICD-10-CM | POA: Diagnosis not present

## 2017-06-09 DIAGNOSIS — I495 Sick sinus syndrome: Secondary | ICD-10-CM | POA: Diagnosis not present

## 2017-06-09 DIAGNOSIS — R630 Anorexia: Secondary | ICD-10-CM | POA: Diagnosis not present

## 2017-06-09 DIAGNOSIS — R634 Abnormal weight loss: Secondary | ICD-10-CM | POA: Diagnosis not present

## 2017-06-09 DIAGNOSIS — F0151 Vascular dementia with behavioral disturbance: Secondary | ICD-10-CM | POA: Diagnosis not present

## 2017-06-09 DIAGNOSIS — I4891 Unspecified atrial fibrillation: Secondary | ICD-10-CM | POA: Diagnosis not present

## 2017-06-09 DIAGNOSIS — E46 Unspecified protein-calorie malnutrition: Secondary | ICD-10-CM | POA: Diagnosis not present

## 2017-06-12 DIAGNOSIS — E46 Unspecified protein-calorie malnutrition: Secondary | ICD-10-CM | POA: Diagnosis not present

## 2017-06-12 DIAGNOSIS — R630 Anorexia: Secondary | ICD-10-CM | POA: Diagnosis not present

## 2017-06-12 DIAGNOSIS — F0151 Vascular dementia with behavioral disturbance: Secondary | ICD-10-CM | POA: Diagnosis not present

## 2017-06-12 DIAGNOSIS — R634 Abnormal weight loss: Secondary | ICD-10-CM | POA: Diagnosis not present

## 2017-06-12 DIAGNOSIS — I495 Sick sinus syndrome: Secondary | ICD-10-CM | POA: Diagnosis not present

## 2017-06-12 DIAGNOSIS — I4891 Unspecified atrial fibrillation: Secondary | ICD-10-CM | POA: Diagnosis not present

## 2017-06-14 DIAGNOSIS — I4891 Unspecified atrial fibrillation: Secondary | ICD-10-CM | POA: Diagnosis not present

## 2017-06-14 DIAGNOSIS — E46 Unspecified protein-calorie malnutrition: Secondary | ICD-10-CM | POA: Diagnosis not present

## 2017-06-14 DIAGNOSIS — I495 Sick sinus syndrome: Secondary | ICD-10-CM | POA: Diagnosis not present

## 2017-06-14 DIAGNOSIS — F0151 Vascular dementia with behavioral disturbance: Secondary | ICD-10-CM | POA: Diagnosis not present

## 2017-06-14 DIAGNOSIS — R634 Abnormal weight loss: Secondary | ICD-10-CM | POA: Diagnosis not present

## 2017-06-14 DIAGNOSIS — R630 Anorexia: Secondary | ICD-10-CM | POA: Diagnosis not present

## 2017-06-15 DIAGNOSIS — F0151 Vascular dementia with behavioral disturbance: Secondary | ICD-10-CM | POA: Diagnosis not present

## 2017-06-15 DIAGNOSIS — R630 Anorexia: Secondary | ICD-10-CM | POA: Diagnosis not present

## 2017-06-15 DIAGNOSIS — R634 Abnormal weight loss: Secondary | ICD-10-CM | POA: Diagnosis not present

## 2017-06-15 DIAGNOSIS — I495 Sick sinus syndrome: Secondary | ICD-10-CM | POA: Diagnosis not present

## 2017-06-15 DIAGNOSIS — I4891 Unspecified atrial fibrillation: Secondary | ICD-10-CM | POA: Diagnosis not present

## 2017-06-15 DIAGNOSIS — E46 Unspecified protein-calorie malnutrition: Secondary | ICD-10-CM | POA: Diagnosis not present

## 2017-06-16 DIAGNOSIS — I4891 Unspecified atrial fibrillation: Secondary | ICD-10-CM | POA: Diagnosis not present

## 2017-06-16 DIAGNOSIS — I495 Sick sinus syndrome: Secondary | ICD-10-CM | POA: Diagnosis not present

## 2017-06-16 DIAGNOSIS — R630 Anorexia: Secondary | ICD-10-CM | POA: Diagnosis not present

## 2017-06-16 DIAGNOSIS — F0151 Vascular dementia with behavioral disturbance: Secondary | ICD-10-CM | POA: Diagnosis not present

## 2017-06-16 DIAGNOSIS — R634 Abnormal weight loss: Secondary | ICD-10-CM | POA: Diagnosis not present

## 2017-06-16 DIAGNOSIS — E46 Unspecified protein-calorie malnutrition: Secondary | ICD-10-CM | POA: Diagnosis not present

## 2017-06-19 DIAGNOSIS — R634 Abnormal weight loss: Secondary | ICD-10-CM | POA: Diagnosis not present

## 2017-06-19 DIAGNOSIS — F0151 Vascular dementia with behavioral disturbance: Secondary | ICD-10-CM | POA: Diagnosis not present

## 2017-06-19 DIAGNOSIS — I495 Sick sinus syndrome: Secondary | ICD-10-CM | POA: Diagnosis not present

## 2017-06-19 DIAGNOSIS — E46 Unspecified protein-calorie malnutrition: Secondary | ICD-10-CM | POA: Diagnosis not present

## 2017-06-19 DIAGNOSIS — I4891 Unspecified atrial fibrillation: Secondary | ICD-10-CM | POA: Diagnosis not present

## 2017-06-19 DIAGNOSIS — R630 Anorexia: Secondary | ICD-10-CM | POA: Diagnosis not present

## 2017-06-21 DIAGNOSIS — R634 Abnormal weight loss: Secondary | ICD-10-CM | POA: Diagnosis not present

## 2017-06-21 DIAGNOSIS — R630 Anorexia: Secondary | ICD-10-CM | POA: Diagnosis not present

## 2017-06-21 DIAGNOSIS — E46 Unspecified protein-calorie malnutrition: Secondary | ICD-10-CM | POA: Diagnosis not present

## 2017-06-21 DIAGNOSIS — I495 Sick sinus syndrome: Secondary | ICD-10-CM | POA: Diagnosis not present

## 2017-06-21 DIAGNOSIS — I4891 Unspecified atrial fibrillation: Secondary | ICD-10-CM | POA: Diagnosis not present

## 2017-06-21 DIAGNOSIS — F0151 Vascular dementia with behavioral disturbance: Secondary | ICD-10-CM | POA: Diagnosis not present

## 2017-06-23 DIAGNOSIS — I495 Sick sinus syndrome: Secondary | ICD-10-CM | POA: Diagnosis not present

## 2017-06-23 DIAGNOSIS — R634 Abnormal weight loss: Secondary | ICD-10-CM | POA: Diagnosis not present

## 2017-06-23 DIAGNOSIS — I4891 Unspecified atrial fibrillation: Secondary | ICD-10-CM | POA: Diagnosis not present

## 2017-06-23 DIAGNOSIS — F0151 Vascular dementia with behavioral disturbance: Secondary | ICD-10-CM | POA: Diagnosis not present

## 2017-06-23 DIAGNOSIS — E46 Unspecified protein-calorie malnutrition: Secondary | ICD-10-CM | POA: Diagnosis not present

## 2017-06-23 DIAGNOSIS — R630 Anorexia: Secondary | ICD-10-CM | POA: Diagnosis not present

## 2017-06-26 DIAGNOSIS — E46 Unspecified protein-calorie malnutrition: Secondary | ICD-10-CM | POA: Diagnosis not present

## 2017-06-26 DIAGNOSIS — R634 Abnormal weight loss: Secondary | ICD-10-CM | POA: Diagnosis not present

## 2017-06-26 DIAGNOSIS — F0151 Vascular dementia with behavioral disturbance: Secondary | ICD-10-CM | POA: Diagnosis not present

## 2017-06-26 DIAGNOSIS — I4891 Unspecified atrial fibrillation: Secondary | ICD-10-CM | POA: Diagnosis not present

## 2017-06-26 DIAGNOSIS — I495 Sick sinus syndrome: Secondary | ICD-10-CM | POA: Diagnosis not present

## 2017-06-26 DIAGNOSIS — R630 Anorexia: Secondary | ICD-10-CM | POA: Diagnosis not present

## 2017-06-28 DIAGNOSIS — R630 Anorexia: Secondary | ICD-10-CM | POA: Diagnosis not present

## 2017-06-28 DIAGNOSIS — I495 Sick sinus syndrome: Secondary | ICD-10-CM | POA: Diagnosis not present

## 2017-06-28 DIAGNOSIS — F0151 Vascular dementia with behavioral disturbance: Secondary | ICD-10-CM | POA: Diagnosis not present

## 2017-06-28 DIAGNOSIS — R634 Abnormal weight loss: Secondary | ICD-10-CM | POA: Diagnosis not present

## 2017-06-28 DIAGNOSIS — I4891 Unspecified atrial fibrillation: Secondary | ICD-10-CM | POA: Diagnosis not present

## 2017-06-28 DIAGNOSIS — E46 Unspecified protein-calorie malnutrition: Secondary | ICD-10-CM | POA: Diagnosis not present

## 2017-06-29 ENCOUNTER — Non-Acute Institutional Stay (SKILLED_NURSING_FACILITY): Payer: Medicare Other | Admitting: Internal Medicine

## 2017-06-29 ENCOUNTER — Encounter: Payer: Self-pay | Admitting: Internal Medicine

## 2017-06-29 DIAGNOSIS — K5904 Chronic idiopathic constipation: Secondary | ICD-10-CM | POA: Diagnosis not present

## 2017-06-29 DIAGNOSIS — F329 Major depressive disorder, single episode, unspecified: Secondary | ICD-10-CM

## 2017-06-29 DIAGNOSIS — G309 Alzheimer's disease, unspecified: Secondary | ICD-10-CM | POA: Diagnosis not present

## 2017-06-29 DIAGNOSIS — I10 Essential (primary) hypertension: Secondary | ICD-10-CM | POA: Diagnosis not present

## 2017-06-29 DIAGNOSIS — E43 Unspecified severe protein-calorie malnutrition: Secondary | ICD-10-CM

## 2017-06-29 DIAGNOSIS — F0281 Dementia in other diseases classified elsewhere with behavioral disturbance: Secondary | ICD-10-CM | POA: Diagnosis not present

## 2017-06-29 DIAGNOSIS — F419 Anxiety disorder, unspecified: Secondary | ICD-10-CM

## 2017-06-29 NOTE — Progress Notes (Signed)
Location:  [facility] Room Number: 30 Place of Service:  SNF (31) Provider:  Oneal GroutMahima Roni Scow MD  Oneal GroutPandey, Coulter Oldaker, MD  Patient Care Team: Oneal GroutPandey, Trajon Rosete, MD as PCP - General (Internal Medicine) Mast, Man X, NP as Nurse Practitioner (Nurse Practitioner) [place] [place] Lowella BandyM Jr., MD as Consulting Physician (Urology) Rachael FeeJacobs, Daniel P, MD as Consulting Physician (Gastroenterology) Salvatore MarvelWainer, Robert, MD as Consulting Physician (Orthopedic Surgery) Venancio PoissonLomax, Laura, MD as Consulting Physician (Dermatology) Sheral ApleyMurphy, Timothy D, MD as Attending Physician (Orthopedic Surgery)  Extended Emergency Contact Information Primary Emergency Contact: Cook,[name] Address: [address]          [place], [place] [address] [place] Home Phone: [phone] Work Phone: [phone] Mobile Phone: [phone] Relation: Son Secondary Emergency Contact: Wenzlick,[name] Address: [address]          [place], [place] [address] [place] Home Phone: [phone] Relation: Relative  Code Status:  DNR  Goals of care: Advanced Directive information Advanced Directives 05/17/2017  Does Patient Have a Medical Advance Directive? Yes  Type of Estate agentAdvance Directive Healthcare Power of [place];Living will;Out of facility DNR (pink MOST or yellow form)  Does patient want to make changes to medical advance directive? No - Patient declined  Copy of Healthcare Power of Attorney in Chart? Yes  Pre-existing out of facility DNR order (yellow form or pink MOST form) Yellow form placed in chart (order not valid for inpatient use);Pink MOST form placed in chart (order not valid for inpatient use)     Chief Complaint  Patient presents with  . Medical Management of Chronic Issues    HPI:  Pt is a 83 y.o. female seen today for medical management of chronic diseases. She is seen in her room today. She is her the recliner. She has advanced dementia. She hollers at times  and is talking few words to herself. She is unable to express her needs and is under total care. She does not appear in distress. She is being followed by hospice services for comfort measures. Reviewed her medications and vital signs. She is incontinent with her bowel and bladder. She has frail and thin skin and gets bruised easily.    Past Medical History:  Diagnosis Date  . Abnormality of gait 09/08/2003  . AF (atrial fibrillation) (HCC)   . Anxiety state, unspecified 08/19/2010  . Aortic stenosis   . Atrial fibrillation (HCC) 07/14/2009   Qualifier: Diagnosis of  By: Graciela HusbandsKlein, MD, Ruthann CancerFACC, Ty HiltsSteven Cochran   . Bradycardia   . Cardiac pacemaker in situ 09/12/2004  . Closed fracture of lumbar vertebra without mention of spinal cord injury 108/13/202010  . Closed fracture of unspecified trochanteric section of femur 05/29/2011  . Constipation 03/14/2014  . Dementia 2013   04/03/14 MMSE 22/30   . Depression 04/10/2014  . Diffuse cystic mastopathy 12/23/2009  . Disturbance of salivary secretion [id number]  . Edema 08/18/2003  . Fall 12/19/2012   07/22/14 fall VS 144/76, 60, 20, 97.1. No apparent injury   . Flatulence, eructation, and gas pain 12/03/2009  . Fracture of greater trochanter of left femur (HCC) 03/08/14  . FTT (failure to thrive) in adult 12/15/2014  . Hearing loss   . Hemorrhoids   . Hip fracture (HCC)   . HTN (hypertension)   . Hypothyroidism 12/19/2007   Qualifier: Diagnosis of  By: Melvyn NethLewis CMA (AAMA), Patty  01/16/14 TSH 2.474 03/24/14 TSH 2.576    . 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 Loss of weight 10/13/2014   Multiple factorials: dementia, depression, advanced age, possible AR of medications(metformin and Oxybutynin)-will increase Mirtazapine and continue supplement. Observe.    . Macular degeneration (senile) of retina, unspecified 12/12/2001  . Macular degeneration of both eyes 01/24/2013   Severe visual impairment. Nearly blind.   . Mitral  valve prolapse   . Occlusion and stenosis of carotid artery without mention of cerebral infarction 12/27/2007  . Other and unspecified hyperlipidemia   . Other malaise and fatigue 12/06/2007  . Pacemaker-dual  medtronic 09/07/2011  . Personal history of fall 01/29/2009  . Rectal bleeding   . Senile osteoporosis 12/13/2000  . Sinus node dysfunction (HCC)   . Syncope and collapse 12/27/2007  . Trochanteric fracture of left femur (HCC) 03/14/2014   CT scan did however reveal acute fracture in greater left trochanteric. Dr. Margarita Ranaimothy Murphy consulted. Recommended nonoperative management and weightbearing as tolerated and another 3 weeks of Lovenox for DVT prophylaxis.       . Type 2 diabetes mellitus with blindness, with macular edema, with severe nonproliferative retinopathy 12/19/2007   Qualifier: Diagnosis of  By: Melvyn NethLewis CMA (AAMA), Patty  01/09/14 Hgb A1c 6.5 01/16/14 Hgb A1c 6.5 03/24/14 Hgb 6.4 12/10/14 pharm: dc Metformin and CBG monitoring-failure to thrive and refusing to eat.     Marland Kitchen. Unspecified hereditary and idiopathic peripheral neuropathy 10/28/202012  . Unspecified hypothyroidism 03/18/2010  . Unspecified urinary incontinence 02/22/2005  . Urinary tract infection, site not specified 04/10/2014  . Xerophthalmus 01/21/2015   Past Surgical History:  Procedure Laterality Date  . BREAST BIOPSY Left 1970   benign  . Left hip surgery  09/2005  . PACEMAKER INSERTION  2006   Medtronic In-Pulse  . Right hip surgery  08/2003  . TONSILLECTOMY    . 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 Codeine     unknown  . Ibuprofen     unknown  . Lisinopril     unknown  . Penicillins     unknown  . Sanctura [Trospium Chloride]     unknown    Outpatient Encounter Medications as of 06/29/2017  Medication Sig  . acetaminophen (TYLENOL) 325 MG tablet Take 650 mg by mouth every 4 (four) hours as needed for mild  pain or moderate pain.   Marland Kitchen. atropine 1 % ophthalmic solution every 2 (two) hours as needed. Place 4 drops on tongue  for excess secretions  . bisacodyl (DULCOLAX) 10 MG suppository Place 10 mg rectally daily as needed for moderate constipation.   . diphenoxylate-atropine (LOMOTIL) 2.5-0.025 MG/5ML liquid Take by mouth every 2 (two) hours as needed. 4 drops under the tongue as needed for excessive secretions  . escitalopram (LEXAPRO) 10 MG tablet Take 15 mg by mouth daily.   Marland Kitchen. LORazepam (ATIVAN) 1 MG tablet Take 1 tablet (1 mg total) by mouth 2 (two) times daily.  Marland Kitchen. morphine 20 MG/5ML solution Take by mouth every 2 (two) hours as needed for pain. Give 0.4125ml, sublingual  . sennosides-docusate sodium (SENOKOT-S) 8.6-50 MG tablet Take 2 tablets by mouth at bedtime. Reported on 12/11/2015   No facility-administered encounter medications on file as of 06/29/2017.     Review of Systems  Unable to perform ROS: Dementia (unable to obtain)    Immunization History  Administered Date(s) Administered  . Influenza Whole 05/10/2017  . Influenza-Unspecified 06/06/2013, 04/21/2015, 05/17/2016  .  PPD Test 07/23/2011  . Pneumococcal Conjugate-13 03/14/2009  . Pneumococcal Polysaccharide-23 03/14/2009  . Td 03/14/2009  . Tdap 03/14/2009  . Zoster 06/29/2006, 06/29/2006   Pertinent  Health Maintenance Due  Topic Date Due  . OPHTHALMOLOGY EXAM  07[birth year]27  . DEXA SCAN  07/27/1982  . PNA vac Low Risk Adult (2 of 2 - PCV13) 03/14/2010  . HEMOGLOBIN A1C  06/08/2017  . FOOT EXAM  12/05/2017  . URINE MICROALBUMIN  12/06/2017  . INFLUENZA VACCINE  Completed   Fall Risk  03/30/2017 01/26/2015 03/06/2014  Falls in the past year? No Yes Yes  Number falls in past yr: - 1 1  Comment - - 03/06/14  Injury with Fall? - Yes Yes  Comment - - left hip   Risk Factor Category  - - High Fall Risk  Risk for fall due to : - History of fall(s);Impaired balance/gait;Impaired mobility -  Follow up - Falls evaluation  completed -   Functional Status Survey:    Vitals:   06/29/17 0833  BP: 130/60  Pulse: 60  Resp: 16  Temp: 97.6 F (36.4 C)  Weight: 82 lb 1.6 oz (37.2 kg)  Height: 5\' 2"  (1.575 m)   Body mass index is 15.02 kg/m.   Wt Readings from Last 3 Encounters:  06/29/17 82 lb 1.6 oz (37.2 kg)  05/17/17 85 lb 12.8 oz (38.9 kg)  04/14/17 89 lb 8 oz (40.6 kg)   Physical Exam  Constitutional:  Thin built, frail, in no distress  HENT:  Head: Normocephalic and atraumatic.  Eyes: Conjunctivae are normal. Pupils are equal, round, and reactive to light. Right eye exhibits no discharge. Left eye exhibits no discharge.  Legally blind  Neck: Neck supple.  Cardiovascular: Normal rate.  Murmur heard. Pulmonary/Chest: Effort normal and breath sounds normal. No respiratory distress.  Abdominal: Soft. Bowel sounds are normal. There is no tenderness.  Musculoskeletal: She exhibits deformity. She exhibits no edema.  Arthritis changes to her fingers, contracture to LLE  Lymphadenopathy:    She has no cervical adenopathy.  Neurological: She is alert.  Not oriented  Skin: Skin is warm and dry.  Easy bruising, geri sleeves present  Psychiatric:  Confused, hollers at times    Labs reviewed: Recent Labs    12/06/16  NA 142  K 3.9  BUN 24*  CREATININE 0.9   Recent Labs    12/06/16  AST 21  ALT 11  ALKPHOS 70   No results for input(s): WBC, NEUTROABS, HGB, HCT, MCV, PLT in the last 8760 hours. Lab Results  Component Value Date   TSH 3.55 01/27/2015   Lab Results  Component Value Date   HGBA1C 5.2 12/06/2016   Lab Results  Component Value Date   CHOL 140 12/20/2012   HDL 38 12/20/2012   LDLCALC 57 12/20/2012   TRIG 227 (A) 12/20/2012   CHOLHDL 5.0 12/22/2007    Significant Diagnostic Results in last 30 days:  No results found.  Assessment/Plan  Severe protein calorie malnutrition Low weight and BMI. Assistance with feeding. Nutritional supplement as  tolerated.  Chronic depression and anxiety With advanced dementia. Continue lexapro 15 mg daily with lorazepam 1 mg bid.  Chronic constipation Continue senokot s daily with prn dulcolax suppository and monitor  Advanced dementia with behavioral disturbance Supportive care. Continue mood medication. Decline anticipated.  Hypertension Off medications. Monitor clinically.    Family/ staff Communication: reviewed care plan with patient and charge nurse.    Labs/tests ordered:  none  Blanchie Serve, MD Internal Medicine [facility] [address] [place], [place] [address] Cell Phone (Monday-Friday 8 am - 5 pm): [phone] On Call: [phone] and follow prompts after 5 pm and on weekends Office Phone: [phone] Office Fax: [phone]

## 2017-06-30 DIAGNOSIS — E46 Unspecified protein-calorie malnutrition: Secondary | ICD-10-CM | POA: Diagnosis not present

## 2017-06-30 DIAGNOSIS — I495 Sick sinus syndrome: Secondary | ICD-10-CM | POA: Diagnosis not present

## 2017-06-30 DIAGNOSIS — F0151 Vascular dementia with behavioral disturbance: Secondary | ICD-10-CM | POA: Diagnosis not present

## 2017-06-30 DIAGNOSIS — R630 Anorexia: Secondary | ICD-10-CM | POA: Diagnosis not present

## 2017-06-30 DIAGNOSIS — R634 Abnormal weight loss: Secondary | ICD-10-CM | POA: Diagnosis not present

## 2017-06-30 DIAGNOSIS — I4891 Unspecified atrial fibrillation: Secondary | ICD-10-CM | POA: Diagnosis not present

## 2017-07-01 DIAGNOSIS — F0151 Vascular dementia with behavioral disturbance: Secondary | ICD-10-CM | POA: Diagnosis not present

## 2017-07-01 DIAGNOSIS — E785 Hyperlipidemia, unspecified: Secondary | ICD-10-CM | POA: Diagnosis not present

## 2017-07-01 DIAGNOSIS — I10 Essential (primary) hypertension: Secondary | ICD-10-CM | POA: Diagnosis not present

## 2017-07-01 DIAGNOSIS — R630 Anorexia: Secondary | ICD-10-CM | POA: Diagnosis not present

## 2017-07-01 DIAGNOSIS — I4891 Unspecified atrial fibrillation: Secondary | ICD-10-CM | POA: Diagnosis not present

## 2017-07-01 DIAGNOSIS — H3530 Unspecified macular degeneration: Secondary | ICD-10-CM | POA: Diagnosis not present

## 2017-07-01 DIAGNOSIS — R634 Abnormal weight loss: Secondary | ICD-10-CM | POA: Diagnosis not present

## 2017-07-01 DIAGNOSIS — E1159 Type 2 diabetes mellitus with other circulatory complications: Secondary | ICD-10-CM | POA: Diagnosis not present

## 2017-07-01 DIAGNOSIS — M84359S Stress fracture, hip, unspecified, sequela: Secondary | ICD-10-CM | POA: Diagnosis not present

## 2017-07-01 DIAGNOSIS — I495 Sick sinus syndrome: Secondary | ICD-10-CM | POA: Diagnosis not present

## 2017-07-01 DIAGNOSIS — E039 Hypothyroidism, unspecified: Secondary | ICD-10-CM | POA: Diagnosis not present

## 2017-07-01 DIAGNOSIS — E46 Unspecified protein-calorie malnutrition: Secondary | ICD-10-CM | POA: Diagnosis not present

## 2017-07-01 DIAGNOSIS — F339 Major depressive disorder, recurrent, unspecified: Secondary | ICD-10-CM | POA: Diagnosis not present

## 2017-07-03 DIAGNOSIS — R630 Anorexia: Secondary | ICD-10-CM | POA: Diagnosis not present

## 2017-07-03 DIAGNOSIS — F0151 Vascular dementia with behavioral disturbance: Secondary | ICD-10-CM | POA: Diagnosis not present

## 2017-07-03 DIAGNOSIS — E46 Unspecified protein-calorie malnutrition: Secondary | ICD-10-CM | POA: Diagnosis not present

## 2017-07-03 DIAGNOSIS — I4891 Unspecified atrial fibrillation: Secondary | ICD-10-CM | POA: Diagnosis not present

## 2017-07-03 DIAGNOSIS — R634 Abnormal weight loss: Secondary | ICD-10-CM | POA: Diagnosis not present

## 2017-07-03 DIAGNOSIS — I495 Sick sinus syndrome: Secondary | ICD-10-CM | POA: Diagnosis not present

## 2017-07-05 DIAGNOSIS — R630 Anorexia: Secondary | ICD-10-CM | POA: Diagnosis not present

## 2017-07-05 DIAGNOSIS — I4891 Unspecified atrial fibrillation: Secondary | ICD-10-CM | POA: Diagnosis not present

## 2017-07-05 DIAGNOSIS — F0151 Vascular dementia with behavioral disturbance: Secondary | ICD-10-CM | POA: Diagnosis not present

## 2017-07-05 DIAGNOSIS — I495 Sick sinus syndrome: Secondary | ICD-10-CM | POA: Diagnosis not present

## 2017-07-05 DIAGNOSIS — E46 Unspecified protein-calorie malnutrition: Secondary | ICD-10-CM | POA: Diagnosis not present

## 2017-07-05 DIAGNOSIS — R634 Abnormal weight loss: Secondary | ICD-10-CM | POA: Diagnosis not present

## 2017-07-07 DIAGNOSIS — R630 Anorexia: Secondary | ICD-10-CM | POA: Diagnosis not present

## 2017-07-07 DIAGNOSIS — F0151 Vascular dementia with behavioral disturbance: Secondary | ICD-10-CM | POA: Diagnosis not present

## 2017-07-07 DIAGNOSIS — E46 Unspecified protein-calorie malnutrition: Secondary | ICD-10-CM | POA: Diagnosis not present

## 2017-07-07 DIAGNOSIS — R634 Abnormal weight loss: Secondary | ICD-10-CM | POA: Diagnosis not present

## 2017-07-07 DIAGNOSIS — I4891 Unspecified atrial fibrillation: Secondary | ICD-10-CM | POA: Diagnosis not present

## 2017-07-07 DIAGNOSIS — I495 Sick sinus syndrome: Secondary | ICD-10-CM | POA: Diagnosis not present

## 2017-07-10 DIAGNOSIS — I4891 Unspecified atrial fibrillation: Secondary | ICD-10-CM | POA: Diagnosis not present

## 2017-07-10 DIAGNOSIS — E46 Unspecified protein-calorie malnutrition: Secondary | ICD-10-CM | POA: Diagnosis not present

## 2017-07-10 DIAGNOSIS — F0151 Vascular dementia with behavioral disturbance: Secondary | ICD-10-CM | POA: Diagnosis not present

## 2017-07-10 DIAGNOSIS — R634 Abnormal weight loss: Secondary | ICD-10-CM | POA: Diagnosis not present

## 2017-07-10 DIAGNOSIS — R630 Anorexia: Secondary | ICD-10-CM | POA: Diagnosis not present

## 2017-07-10 DIAGNOSIS — I495 Sick sinus syndrome: Secondary | ICD-10-CM | POA: Diagnosis not present

## 2017-07-12 DIAGNOSIS — R630 Anorexia: Secondary | ICD-10-CM | POA: Diagnosis not present

## 2017-07-12 DIAGNOSIS — F0151 Vascular dementia with behavioral disturbance: Secondary | ICD-10-CM | POA: Diagnosis not present

## 2017-07-12 DIAGNOSIS — I495 Sick sinus syndrome: Secondary | ICD-10-CM | POA: Diagnosis not present

## 2017-07-12 DIAGNOSIS — R634 Abnormal weight loss: Secondary | ICD-10-CM | POA: Diagnosis not present

## 2017-07-12 DIAGNOSIS — I4891 Unspecified atrial fibrillation: Secondary | ICD-10-CM | POA: Diagnosis not present

## 2017-07-12 DIAGNOSIS — E46 Unspecified protein-calorie malnutrition: Secondary | ICD-10-CM | POA: Diagnosis not present

## 2017-07-13 DIAGNOSIS — E46 Unspecified protein-calorie malnutrition: Secondary | ICD-10-CM | POA: Diagnosis not present

## 2017-07-13 DIAGNOSIS — R634 Abnormal weight loss: Secondary | ICD-10-CM | POA: Diagnosis not present

## 2017-07-13 DIAGNOSIS — I495 Sick sinus syndrome: Secondary | ICD-10-CM | POA: Diagnosis not present

## 2017-07-13 DIAGNOSIS — F0151 Vascular dementia with behavioral disturbance: Secondary | ICD-10-CM | POA: Diagnosis not present

## 2017-07-13 DIAGNOSIS — R630 Anorexia: Secondary | ICD-10-CM | POA: Diagnosis not present

## 2017-07-13 DIAGNOSIS — I4891 Unspecified atrial fibrillation: Secondary | ICD-10-CM | POA: Diagnosis not present

## 2017-07-14 DIAGNOSIS — I4891 Unspecified atrial fibrillation: Secondary | ICD-10-CM | POA: Diagnosis not present

## 2017-07-14 DIAGNOSIS — R634 Abnormal weight loss: Secondary | ICD-10-CM | POA: Diagnosis not present

## 2017-07-14 DIAGNOSIS — I495 Sick sinus syndrome: Secondary | ICD-10-CM | POA: Diagnosis not present

## 2017-07-14 DIAGNOSIS — R630 Anorexia: Secondary | ICD-10-CM | POA: Diagnosis not present

## 2017-07-14 DIAGNOSIS — F0151 Vascular dementia with behavioral disturbance: Secondary | ICD-10-CM | POA: Diagnosis not present

## 2017-07-14 DIAGNOSIS — E46 Unspecified protein-calorie malnutrition: Secondary | ICD-10-CM | POA: Diagnosis not present

## 2017-07-17 DIAGNOSIS — I4891 Unspecified atrial fibrillation: Secondary | ICD-10-CM | POA: Diagnosis not present

## 2017-07-17 DIAGNOSIS — I495 Sick sinus syndrome: Secondary | ICD-10-CM | POA: Diagnosis not present

## 2017-07-17 DIAGNOSIS — F0151 Vascular dementia with behavioral disturbance: Secondary | ICD-10-CM | POA: Diagnosis not present

## 2017-07-17 DIAGNOSIS — E46 Unspecified protein-calorie malnutrition: Secondary | ICD-10-CM | POA: Diagnosis not present

## 2017-07-17 DIAGNOSIS — R630 Anorexia: Secondary | ICD-10-CM | POA: Diagnosis not present

## 2017-07-17 DIAGNOSIS — R634 Abnormal weight loss: Secondary | ICD-10-CM | POA: Diagnosis not present

## 2017-07-19 DIAGNOSIS — I4891 Unspecified atrial fibrillation: Secondary | ICD-10-CM | POA: Diagnosis not present

## 2017-07-19 DIAGNOSIS — I495 Sick sinus syndrome: Secondary | ICD-10-CM | POA: Diagnosis not present

## 2017-07-19 DIAGNOSIS — E46 Unspecified protein-calorie malnutrition: Secondary | ICD-10-CM | POA: Diagnosis not present

## 2017-07-19 DIAGNOSIS — R630 Anorexia: Secondary | ICD-10-CM | POA: Diagnosis not present

## 2017-07-19 DIAGNOSIS — R634 Abnormal weight loss: Secondary | ICD-10-CM | POA: Diagnosis not present

## 2017-07-19 DIAGNOSIS — F0151 Vascular dementia with behavioral disturbance: Secondary | ICD-10-CM | POA: Diagnosis not present

## 2017-07-21 ENCOUNTER — Non-Acute Institutional Stay (SKILLED_NURSING_FACILITY): Payer: Medicare Other | Admitting: Nurse Practitioner

## 2017-07-21 ENCOUNTER — Encounter: Payer: Self-pay | Admitting: Nurse Practitioner

## 2017-07-21 DIAGNOSIS — F0151 Vascular dementia with behavioral disturbance: Secondary | ICD-10-CM | POA: Diagnosis not present

## 2017-07-21 DIAGNOSIS — F329 Major depressive disorder, single episode, unspecified: Secondary | ICD-10-CM

## 2017-07-21 DIAGNOSIS — F419 Anxiety disorder, unspecified: Secondary | ICD-10-CM | POA: Diagnosis not present

## 2017-07-21 DIAGNOSIS — R627 Adult failure to thrive: Secondary | ICD-10-CM | POA: Diagnosis not present

## 2017-07-21 DIAGNOSIS — F0281 Dementia in other diseases classified elsewhere with behavioral disturbance: Secondary | ICD-10-CM

## 2017-07-21 DIAGNOSIS — I4891 Unspecified atrial fibrillation: Secondary | ICD-10-CM | POA: Diagnosis not present

## 2017-07-21 DIAGNOSIS — G301 Alzheimer's disease with late onset: Secondary | ICD-10-CM | POA: Diagnosis not present

## 2017-07-21 DIAGNOSIS — E46 Unspecified protein-calorie malnutrition: Secondary | ICD-10-CM | POA: Diagnosis not present

## 2017-07-21 DIAGNOSIS — R634 Abnormal weight loss: Secondary | ICD-10-CM | POA: Diagnosis not present

## 2017-07-21 DIAGNOSIS — I495 Sick sinus syndrome: Secondary | ICD-10-CM | POA: Diagnosis not present

## 2017-07-21 DIAGNOSIS — R630 Anorexia: Secondary | ICD-10-CM | POA: Diagnosis not present

## 2017-07-21 NOTE — Assessment & Plan Note (Signed)
The patient is legally blind, HOH, has history of advanced dementia, total care of ADLs, incontinent of bowel and bladder, mechanical lift for transfer, stays in recliner once she is out of bed. Her goal of care is comfort measures, prn Morphine 5mg  q2h, Atropine ophthalmic sol SL q2h prn, Acetaminophen 650mg  q4h prn available to her, GDR: decrease Lorazepam to 0.5mg  12pm po, continue Lorazepam 1mg  4pm. Observe.

## 2017-07-21 NOTE — Assessment & Plan Note (Signed)
Persists, continue supportive care, Hospice service.  

## 2017-07-21 NOTE — Progress Notes (Signed)
Location:  [facility] Room Number: 30 Place of Service:  SNF (31) Provider:  Reagann Dolce, Manxie  NP  Oneal Grout, MD  Patient Care Team: Oneal Grout, MD as PCP - General (Internal Medicine) Macaila Tahir X, NP as Nurse Practitioner (Nurse Practitioner) [place] [place], [place] Lowella Bandy., MD as Consulting Physician (Urology) Rachael Fee, MD as Consulting Physician (Gastroenterology) Salvatore Marvel, MD as Consulting Physician (Orthopedic Surgery) Venancio Poisson, MD as Consulting Physician (Dermatology) Sheral Apley, MD as Attending Physician (Orthopedic Surgery)  Extended Emergency Contact Information Primary Emergency Contact: Cook,[name] Address: [address]          [place], [place] [address] [place] Home Phone: [phone] Work Phone: [phone] Mobile Phone: [phone] Relation: Son Secondary Emergency Contact: Knack,[name] Address: [address]          [place], [place] [address] [place] Home Phone: [phone] Relation: Relative  Code Status:  DNR Goals of care: Advanced Directive information Advanced Directives 05/17/2017  Does Patient Have a Medical Advance Directive? Yes  Type of Estate agent of [place];Living will;Out of facility DNR (pink MOST or yellow form)  Does patient want to make changes to medical advance directive? No - Patient declined  Copy of Healthcare Power of Attorney in Chart? Yes  Pre-existing out of facility DNR order (yellow form or pink MOST form) Yellow form placed in chart (order not valid for inpatient use);Pink MOST form placed in chart (order not valid for inpatient use)     Chief Complaint  Patient presents with  . Medical Management of Chronic Issues    HPI:  Pt is a 83 y.o. female seen today for medical management of chronic diseases.     The patient is legally blind, HOH, has history of advanced dementia, total care of ADLs, incontinent  of bowel and bladder, mechanical lift for transfer, stays in recliner once she is out of bed. Her goal of care is comfort measures, prn Morphine 5mg  q2h, Atropine ophthalmic sol SL q2h prn, Acetaminophen 650mg  q4h prn available to her, her mood is stable, taking Lorazepam 1mg  bid, Escitalopram 10mg  daily.    Past Medical History:  Diagnosis Date  . Abnormality of gait 09/08/2003  . AF (atrial fibrillation) (HCC)   . Anxiety state, unspecified 08/19/2010  . Aortic stenosis   . Atrial fibrillation (HCC) 07/14/2009   Qualifier: Diagnosis of  By: [name], MD, Ruthann Cancer Ty Hilts   . Bradycardia   . Cardiac pacemaker in situ 09/12/2004  . Closed fracture of lumbar vertebra without mention of spinal cord injury 105/01/2009  . Closed fracture of unspecified trochanteric section of femur 05/29/2011  . Constipation 03/14/2014  . Dementia 2013   04/03/14 MMSE 22/30   . Depression 04/10/2014  . Diffuse cystic mastopathy 12/23/2009  . Disturbance of salivary secretion [id number]  . Edema 08/18/2003  . Fall 12/19/2012   07/22/14 fall VS 144/76, 60, 20, 97.1. No apparent injury   . Flatulence, eructation, and gas pain 12/03/2009  . Fracture of greater trochanter of left femur (HCC) 03/08/14  . FTT (failure to thrive) in adult 12/15/2014  . Hearing loss   . Hemorrhoids   . Hip fracture (HCC)   . HTN (hypertension)   . Hypothyroidism 12/19/2007   Qualifier: Diagnosis of  By: Melvyn Neth CMA (AAMA), Patty  01/16/14 TSH 2.474 03/24/14 TSH 2.576    . 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  Loss of weight 10/13/2014   Multiple factorials: dementia, depression, advanced age, possible AR of medications(metformin and Oxybutynin)-will increase Mirtazapine and continue supplement. Observe.    . Macular degeneration (senile) of retina, unspecified 12/12/2001  . Macular degeneration of both eyes 01/24/2013   Severe visual impairment. Nearly blind.   . Mitral valve prolapse   . Occlusion and  stenosis of carotid artery without mention of cerebral infarction 12/27/2007  . Other and unspecified hyperlipidemia   . Other malaise and fatigue 12/06/2007  . Pacemaker-dual  medtronic 09/07/2011  . Personal history of fall 01/29/2009  . Rectal bleeding   . Senile osteoporosis 12/13/2000  . Sinus node dysfunction (HCC)   . Syncope and collapse 12/27/2007  . Trochanteric fracture of left femur (HCC) 03/14/2014   CT scan did however reveal acute fracture in greater left trochanteric. Dr. Margarita Ranaimothy Murphy consulted. Recommended nonoperative management and weightbearing as tolerated and another 3 weeks of Lovenox for DVT prophylaxis.       . Type 2 diabetes mellitus with blindness, with macular edema, with severe nonproliferative retinopathy 12/19/2007   Qualifier: Diagnosis of  By: Melvyn NethLewis CMA (AAMA), Patty  01/09/14 Hgb A1c 6.5 01/16/14 Hgb A1c 6.5 03/24/14 Hgb 6.4 12/10/14 pharm: dc Metformin and CBG monitoring-failure to thrive and refusing to eat.     Marland Kitchen. Unspecified hereditary and idiopathic peripheral neuropathy 10/28/202012  . Unspecified hypothyroidism 03/18/2010  . Unspecified urinary incontinence 02/22/2005  . Urinary tract infection, site not specified 04/10/2014  . Xerophthalmus 01/21/2015   Past Surgical History:  Procedure Laterality Date  . BREAST BIOPSY Left 1970   benign  . Left hip surgery  09/2005  . PACEMAKER INSERTION  2006   Medtronic In-Pulse  . Right hip surgery  08/2003  . TONSILLECTOMY    . 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 Codeine     unknown  . Ibuprofen     unknown  . Lisinopril     unknown  . Penicillins     unknown  . Sanctura [Trospium Chloride]     unknown    Outpatient Encounter Medications as of 07/21/2017  Medication Sig  . acetaminophen (TYLENOL) 325 MG tablet Take 650 mg by mouth every 4 (four) hours as needed for mild pain or moderate pain.   Marland Kitchen.  atropine 1 % ophthalmic solution every 2 (two) hours as needed. Place 4 drops on tongue  for excess secretions  . bisacodyl (DULCOLAX) 10 MG suppository Place 10 mg rectally daily as needed for moderate constipation.   Marland Kitchen. escitalopram (LEXAPRO) 10 MG tablet Take 15 mg by mouth daily.   . feeding supplement (BOOST / RESOURCE BREEZE) LIQD Take 1 Container by mouth 3 (three) times daily between meals.  Marland Kitchen. LORazepam (ATIVAN) 1 MG tablet Take 1 tablet (1 mg total) by mouth 2 (two) times daily.  Marland Kitchen. morphine 20 MG/5ML solution Take by mouth every 2 (two) hours as needed for pain. Give 0.2125ml, sublingual  . sennosides-docusate sodium (SENOKOT-S) 8.6-50 MG tablet Take 2 tablets by mouth at bedtime. Reported on 12/11/2015  . [DISCONTINUED] diphenoxylate-atropine (LOMOTIL) 2.5-0.025 MG/5ML liquid Take by mouth every 2 (two) hours as needed. 4 drops under the tongue as needed for excessive secretions   No facility-administered encounter medications on file as of 07/21/2017.     Review of Systems  Constitutional: Negative for activity change, appetite change, chills, diaphoresis, fatigue and fever.  HENT:  Positive for hearing loss. Negative for congestion, trouble swallowing and voice change.   Eyes:       Legally blind  Respiratory: Negative for cough, choking, shortness of breath and wheezing.   Cardiovascular: Negative for chest pain, palpitations and leg swelling.  Gastrointestinal: Negative for abdominal distention, abdominal pain, constipation, diarrhea, nausea and vomiting.  Genitourinary: Negative for difficulty urinating, dysuria and urgency.       Incontinent of urine.   Musculoskeletal: Positive for gait problem.  Skin: Negative for color change and wound.  Neurological: Negative for tremors, speech difficulty, weakness and headaches.       Dementia.  Psychiatric/Behavioral: Positive for agitation and confusion. Negative for behavioral problems and sleep disturbance. The patient is not  nervous/anxious.        Occasionally    Immunization History  Administered Date(s) Administered  . Influenza Whole 05/10/2017  . Influenza-Unspecified 06/06/2013, 04/21/2015, 05/17/2016  . PPD Test 07/23/2011  . Pneumococcal Conjugate-13 03/14/2009  . Pneumococcal Polysaccharide-23 03/14/2009  . Td 03/14/2009  . Tdap 03/14/2009  . Zoster 06/29/2006, 06/29/2006   Pertinent  Health Maintenance Due  Topic Date Due  . OPHTHALMOLOGY EXAM  07[birth year]28[birth year]1927  . DEXA SCAN  07/27/1982  . PNA vac Low Risk Adult (2 of 2 - PCV13) 03/14/2010  . HEMOGLOBIN A1C  06/08/2017  . FOOT EXAM  12/05/2017  . URINE MICROALBUMIN  12/06/2017  . INFLUENZA VACCINE  Completed   Fall Risk  03/30/2017 01/26/2015 03/06/2014  Falls in the past year? No Yes Yes  Number falls in past yr: - 1 1  Comment - - 03/06/14  Injury with Fall? - Yes Yes  Comment - - left hip   Risk Factor Category  - - High Fall Risk  Risk for fall due to : - History of fall(s);Impaired balance/gait;Impaired mobility -  Follow up - Falls evaluation completed -   Functional Status Survey:    Vitals:   07/21/17 0919  BP: 118/68  Pulse: 62  Resp: 18  Temp: (!) 97 F (36.1 C)  Weight: 83 lb 8 oz (37.9 kg)  Height: 5\' 2"  (1.575 m)   Body mass index is 15.27 kg/m. Physical Exam  Constitutional: She appears well-developed and well-nourished.  frail  HENT:  Head: Normocephalic and atraumatic.  Eyes:  Legally blind  Neck: Normal range of motion. Neck supple. No JVD present. No thyromegaly present.  Cardiovascular: Normal rate and regular rhythm.  Murmur heard. Pulmonary/Chest: She has no wheezes. She has no rales.  Abdominal: Soft. Bowel sounds are normal. She exhibits no distension. There is no tenderness. There is no rebound.  Musculoskeletal: She exhibits no edema.  Contracture left hip. Mechanical lift for transfer, stays in recliner once out of bed.   Neurological: She is alert. She exhibits normal muscle tone. Coordination  normal.  Oriented to self  Skin: Skin is warm and dry.  Psychiatric:  Agitated sometimes.     Labs reviewed: Recent Labs    12/06/16  NA 142  K 3.9  BUN 24*  CREATININE 0.9   Recent Labs    12/06/16  AST 21  ALT 11  ALKPHOS 70   No results for input(s): WBC, NEUTROABS, HGB, HCT, MCV, PLT in the last 8760 hours. Lab Results  Component Value Date   TSH 3.55 01/27/2015   Lab Results  Component Value Date   HGBA1C 5.2 12/06/2016   Lab Results  Component Value Date   CHOL 140 12/20/2012   HDL 38 12/20/2012  LDLCALC 57 12/20/2012   TRIG 227 (A) 12/20/2012   CHOLHDL 5.0 12/22/2007    Significant Diagnostic Results in last 30 days:  No results found.  Assessment/Plan Alzheimer's dementia with behavioral disturbance The patient is legally blind, HOH, has history of advanced dementia, total care of ADLs, incontinent of bowel and bladder, mechanical lift for transfer, stays in recliner once she is out of bed. Her goal of care is comfort measures, prn Morphine 5mg  q2h, Atropine ophthalmic sol SL q2h prn, Acetaminophen 650mg  q4h prn available to her, GDR: decrease Lorazepam to 0.5mg  12pm po, continue Lorazepam 1mg  4pm. Observe.   FTT (failure to thrive) in adult Persists, continue supportive care, Hospice service.   Anxiety and depression Mood is stable, continue Escitalopram 10mg  po daily, decrease Lorazepam 1mg  12pm 4pm to 0.5mg  12pm, continue 1mg  4pm po.      Family/ staff Communication: plan of care reviewed with the patient and charge nurse  Labs/tests ordered:  none  Time spend 25 minutes.

## 2017-07-21 NOTE — Assessment & Plan Note (Addendum)
Mood is stable, continue Escitalopram 10mg  po daily, decrease Lorazepam 1mg  12pm 4pm to 0.5mg  12pm, continue 1mg  4pm po.

## 2017-07-24 DIAGNOSIS — R634 Abnormal weight loss: Secondary | ICD-10-CM | POA: Diagnosis not present

## 2017-07-24 DIAGNOSIS — E46 Unspecified protein-calorie malnutrition: Secondary | ICD-10-CM | POA: Diagnosis not present

## 2017-07-24 DIAGNOSIS — I4891 Unspecified atrial fibrillation: Secondary | ICD-10-CM | POA: Diagnosis not present

## 2017-07-24 DIAGNOSIS — I495 Sick sinus syndrome: Secondary | ICD-10-CM | POA: Diagnosis not present

## 2017-07-24 DIAGNOSIS — F0151 Vascular dementia with behavioral disturbance: Secondary | ICD-10-CM | POA: Diagnosis not present

## 2017-07-24 DIAGNOSIS — R630 Anorexia: Secondary | ICD-10-CM | POA: Diagnosis not present

## 2017-07-26 DIAGNOSIS — I4891 Unspecified atrial fibrillation: Secondary | ICD-10-CM | POA: Diagnosis not present

## 2017-07-26 DIAGNOSIS — I495 Sick sinus syndrome: Secondary | ICD-10-CM | POA: Diagnosis not present

## 2017-07-26 DIAGNOSIS — R634 Abnormal weight loss: Secondary | ICD-10-CM | POA: Diagnosis not present

## 2017-07-26 DIAGNOSIS — R630 Anorexia: Secondary | ICD-10-CM | POA: Diagnosis not present

## 2017-07-26 DIAGNOSIS — F0151 Vascular dementia with behavioral disturbance: Secondary | ICD-10-CM | POA: Diagnosis not present

## 2017-07-26 DIAGNOSIS — E46 Unspecified protein-calorie malnutrition: Secondary | ICD-10-CM | POA: Diagnosis not present

## 2017-07-28 DIAGNOSIS — I495 Sick sinus syndrome: Secondary | ICD-10-CM | POA: Diagnosis not present

## 2017-07-28 DIAGNOSIS — I4891 Unspecified atrial fibrillation: Secondary | ICD-10-CM | POA: Diagnosis not present

## 2017-07-28 DIAGNOSIS — R630 Anorexia: Secondary | ICD-10-CM | POA: Diagnosis not present

## 2017-07-28 DIAGNOSIS — F0151 Vascular dementia with behavioral disturbance: Secondary | ICD-10-CM | POA: Diagnosis not present

## 2017-07-28 DIAGNOSIS — R634 Abnormal weight loss: Secondary | ICD-10-CM | POA: Diagnosis not present

## 2017-07-28 DIAGNOSIS — E46 Unspecified protein-calorie malnutrition: Secondary | ICD-10-CM | POA: Diagnosis not present

## 2017-07-31 DIAGNOSIS — R630 Anorexia: Secondary | ICD-10-CM | POA: Diagnosis not present

## 2017-07-31 DIAGNOSIS — I4891 Unspecified atrial fibrillation: Secondary | ICD-10-CM | POA: Diagnosis not present

## 2017-07-31 DIAGNOSIS — F0151 Vascular dementia with behavioral disturbance: Secondary | ICD-10-CM | POA: Diagnosis not present

## 2017-07-31 DIAGNOSIS — R634 Abnormal weight loss: Secondary | ICD-10-CM | POA: Diagnosis not present

## 2017-07-31 DIAGNOSIS — I495 Sick sinus syndrome: Secondary | ICD-10-CM | POA: Diagnosis not present

## 2017-07-31 DIAGNOSIS — E46 Unspecified protein-calorie malnutrition: Secondary | ICD-10-CM | POA: Diagnosis not present

## 2017-08-01 DIAGNOSIS — E46 Unspecified protein-calorie malnutrition: Secondary | ICD-10-CM | POA: Diagnosis not present

## 2017-08-01 DIAGNOSIS — M84359S Stress fracture, hip, unspecified, sequela: Secondary | ICD-10-CM | POA: Diagnosis not present

## 2017-08-01 DIAGNOSIS — R634 Abnormal weight loss: Secondary | ICD-10-CM | POA: Diagnosis not present

## 2017-08-01 DIAGNOSIS — R630 Anorexia: Secondary | ICD-10-CM | POA: Diagnosis not present

## 2017-08-01 DIAGNOSIS — E1159 Type 2 diabetes mellitus with other circulatory complications: Secondary | ICD-10-CM | POA: Diagnosis not present

## 2017-08-01 DIAGNOSIS — E785 Hyperlipidemia, unspecified: Secondary | ICD-10-CM | POA: Diagnosis not present

## 2017-08-01 DIAGNOSIS — I495 Sick sinus syndrome: Secondary | ICD-10-CM | POA: Diagnosis not present

## 2017-08-01 DIAGNOSIS — I4891 Unspecified atrial fibrillation: Secondary | ICD-10-CM | POA: Diagnosis not present

## 2017-08-01 DIAGNOSIS — I10 Essential (primary) hypertension: Secondary | ICD-10-CM | POA: Diagnosis not present

## 2017-08-01 DIAGNOSIS — H3530 Unspecified macular degeneration: Secondary | ICD-10-CM | POA: Diagnosis not present

## 2017-08-01 DIAGNOSIS — E039 Hypothyroidism, unspecified: Secondary | ICD-10-CM | POA: Diagnosis not present

## 2017-08-01 DIAGNOSIS — F339 Major depressive disorder, recurrent, unspecified: Secondary | ICD-10-CM | POA: Diagnosis not present

## 2017-08-01 DIAGNOSIS — F0151 Vascular dementia with behavioral disturbance: Secondary | ICD-10-CM | POA: Diagnosis not present

## 2017-08-02 DIAGNOSIS — R630 Anorexia: Secondary | ICD-10-CM | POA: Diagnosis not present

## 2017-08-02 DIAGNOSIS — R634 Abnormal weight loss: Secondary | ICD-10-CM | POA: Diagnosis not present

## 2017-08-02 DIAGNOSIS — E46 Unspecified protein-calorie malnutrition: Secondary | ICD-10-CM | POA: Diagnosis not present

## 2017-08-02 DIAGNOSIS — F0151 Vascular dementia with behavioral disturbance: Secondary | ICD-10-CM | POA: Diagnosis not present

## 2017-08-02 DIAGNOSIS — I495 Sick sinus syndrome: Secondary | ICD-10-CM | POA: Diagnosis not present

## 2017-08-02 DIAGNOSIS — I4891 Unspecified atrial fibrillation: Secondary | ICD-10-CM | POA: Diagnosis not present

## 2017-08-03 DIAGNOSIS — E46 Unspecified protein-calorie malnutrition: Secondary | ICD-10-CM | POA: Diagnosis not present

## 2017-08-03 DIAGNOSIS — R630 Anorexia: Secondary | ICD-10-CM | POA: Diagnosis not present

## 2017-08-03 DIAGNOSIS — R634 Abnormal weight loss: Secondary | ICD-10-CM | POA: Diagnosis not present

## 2017-08-03 DIAGNOSIS — I495 Sick sinus syndrome: Secondary | ICD-10-CM | POA: Diagnosis not present

## 2017-08-03 DIAGNOSIS — I4891 Unspecified atrial fibrillation: Secondary | ICD-10-CM | POA: Diagnosis not present

## 2017-08-03 DIAGNOSIS — F0151 Vascular dementia with behavioral disturbance: Secondary | ICD-10-CM | POA: Diagnosis not present

## 2017-08-04 DIAGNOSIS — I495 Sick sinus syndrome: Secondary | ICD-10-CM | POA: Diagnosis not present

## 2017-08-04 DIAGNOSIS — F0151 Vascular dementia with behavioral disturbance: Secondary | ICD-10-CM | POA: Diagnosis not present

## 2017-08-04 DIAGNOSIS — I4891 Unspecified atrial fibrillation: Secondary | ICD-10-CM | POA: Diagnosis not present

## 2017-08-04 DIAGNOSIS — R634 Abnormal weight loss: Secondary | ICD-10-CM | POA: Diagnosis not present

## 2017-08-04 DIAGNOSIS — E46 Unspecified protein-calorie malnutrition: Secondary | ICD-10-CM | POA: Diagnosis not present

## 2017-08-04 DIAGNOSIS — R630 Anorexia: Secondary | ICD-10-CM | POA: Diagnosis not present

## 2017-08-07 DIAGNOSIS — R630 Anorexia: Secondary | ICD-10-CM | POA: Diagnosis not present

## 2017-08-07 DIAGNOSIS — E46 Unspecified protein-calorie malnutrition: Secondary | ICD-10-CM | POA: Diagnosis not present

## 2017-08-07 DIAGNOSIS — F0151 Vascular dementia with behavioral disturbance: Secondary | ICD-10-CM | POA: Diagnosis not present

## 2017-08-07 DIAGNOSIS — R634 Abnormal weight loss: Secondary | ICD-10-CM | POA: Diagnosis not present

## 2017-08-07 DIAGNOSIS — I4891 Unspecified atrial fibrillation: Secondary | ICD-10-CM | POA: Diagnosis not present

## 2017-08-07 DIAGNOSIS — I495 Sick sinus syndrome: Secondary | ICD-10-CM | POA: Diagnosis not present

## 2017-08-09 DIAGNOSIS — I4891 Unspecified atrial fibrillation: Secondary | ICD-10-CM | POA: Diagnosis not present

## 2017-08-09 DIAGNOSIS — E46 Unspecified protein-calorie malnutrition: Secondary | ICD-10-CM | POA: Diagnosis not present

## 2017-08-09 DIAGNOSIS — F0151 Vascular dementia with behavioral disturbance: Secondary | ICD-10-CM | POA: Diagnosis not present

## 2017-08-09 DIAGNOSIS — R630 Anorexia: Secondary | ICD-10-CM | POA: Diagnosis not present

## 2017-08-09 DIAGNOSIS — R634 Abnormal weight loss: Secondary | ICD-10-CM | POA: Diagnosis not present

## 2017-08-09 DIAGNOSIS — I495 Sick sinus syndrome: Secondary | ICD-10-CM | POA: Diagnosis not present

## 2017-08-10 DIAGNOSIS — R630 Anorexia: Secondary | ICD-10-CM | POA: Diagnosis not present

## 2017-08-10 DIAGNOSIS — R634 Abnormal weight loss: Secondary | ICD-10-CM | POA: Diagnosis not present

## 2017-08-10 DIAGNOSIS — I4891 Unspecified atrial fibrillation: Secondary | ICD-10-CM | POA: Diagnosis not present

## 2017-08-10 DIAGNOSIS — E46 Unspecified protein-calorie malnutrition: Secondary | ICD-10-CM | POA: Diagnosis not present

## 2017-08-10 DIAGNOSIS — I495 Sick sinus syndrome: Secondary | ICD-10-CM | POA: Diagnosis not present

## 2017-08-10 DIAGNOSIS — F0151 Vascular dementia with behavioral disturbance: Secondary | ICD-10-CM | POA: Diagnosis not present

## 2017-08-11 DIAGNOSIS — R630 Anorexia: Secondary | ICD-10-CM | POA: Diagnosis not present

## 2017-08-11 DIAGNOSIS — I4891 Unspecified atrial fibrillation: Secondary | ICD-10-CM | POA: Diagnosis not present

## 2017-08-11 DIAGNOSIS — R634 Abnormal weight loss: Secondary | ICD-10-CM | POA: Diagnosis not present

## 2017-08-11 DIAGNOSIS — F0151 Vascular dementia with behavioral disturbance: Secondary | ICD-10-CM | POA: Diagnosis not present

## 2017-08-11 DIAGNOSIS — E46 Unspecified protein-calorie malnutrition: Secondary | ICD-10-CM | POA: Diagnosis not present

## 2017-08-11 DIAGNOSIS — I495 Sick sinus syndrome: Secondary | ICD-10-CM | POA: Diagnosis not present

## 2017-08-14 DIAGNOSIS — R634 Abnormal weight loss: Secondary | ICD-10-CM | POA: Diagnosis not present

## 2017-08-14 DIAGNOSIS — I495 Sick sinus syndrome: Secondary | ICD-10-CM | POA: Diagnosis not present

## 2017-08-14 DIAGNOSIS — I4891 Unspecified atrial fibrillation: Secondary | ICD-10-CM | POA: Diagnosis not present

## 2017-08-14 DIAGNOSIS — R630 Anorexia: Secondary | ICD-10-CM | POA: Diagnosis not present

## 2017-08-14 DIAGNOSIS — E46 Unspecified protein-calorie malnutrition: Secondary | ICD-10-CM | POA: Diagnosis not present

## 2017-08-14 DIAGNOSIS — F0151 Vascular dementia with behavioral disturbance: Secondary | ICD-10-CM | POA: Diagnosis not present

## 2017-08-15 DIAGNOSIS — F0151 Vascular dementia with behavioral disturbance: Secondary | ICD-10-CM | POA: Diagnosis not present

## 2017-08-15 DIAGNOSIS — I495 Sick sinus syndrome: Secondary | ICD-10-CM | POA: Diagnosis not present

## 2017-08-15 DIAGNOSIS — R634 Abnormal weight loss: Secondary | ICD-10-CM | POA: Diagnosis not present

## 2017-08-15 DIAGNOSIS — E46 Unspecified protein-calorie malnutrition: Secondary | ICD-10-CM | POA: Diagnosis not present

## 2017-08-15 DIAGNOSIS — I4891 Unspecified atrial fibrillation: Secondary | ICD-10-CM | POA: Diagnosis not present

## 2017-08-15 DIAGNOSIS — R630 Anorexia: Secondary | ICD-10-CM | POA: Diagnosis not present

## 2017-08-16 DIAGNOSIS — R630 Anorexia: Secondary | ICD-10-CM | POA: Diagnosis not present

## 2017-08-16 DIAGNOSIS — R634 Abnormal weight loss: Secondary | ICD-10-CM | POA: Diagnosis not present

## 2017-08-16 DIAGNOSIS — I495 Sick sinus syndrome: Secondary | ICD-10-CM | POA: Diagnosis not present

## 2017-08-16 DIAGNOSIS — E46 Unspecified protein-calorie malnutrition: Secondary | ICD-10-CM | POA: Diagnosis not present

## 2017-08-16 DIAGNOSIS — F0151 Vascular dementia with behavioral disturbance: Secondary | ICD-10-CM | POA: Diagnosis not present

## 2017-08-16 DIAGNOSIS — I4891 Unspecified atrial fibrillation: Secondary | ICD-10-CM | POA: Diagnosis not present

## 2017-08-17 DIAGNOSIS — E46 Unspecified protein-calorie malnutrition: Secondary | ICD-10-CM | POA: Diagnosis not present

## 2017-08-17 DIAGNOSIS — F0151 Vascular dementia with behavioral disturbance: Secondary | ICD-10-CM | POA: Diagnosis not present

## 2017-08-17 DIAGNOSIS — R634 Abnormal weight loss: Secondary | ICD-10-CM | POA: Diagnosis not present

## 2017-08-17 DIAGNOSIS — I4891 Unspecified atrial fibrillation: Secondary | ICD-10-CM | POA: Diagnosis not present

## 2017-08-17 DIAGNOSIS — R630 Anorexia: Secondary | ICD-10-CM | POA: Diagnosis not present

## 2017-08-17 DIAGNOSIS — I495 Sick sinus syndrome: Secondary | ICD-10-CM | POA: Diagnosis not present

## 2017-08-18 DIAGNOSIS — I495 Sick sinus syndrome: Secondary | ICD-10-CM | POA: Diagnosis not present

## 2017-08-18 DIAGNOSIS — I4891 Unspecified atrial fibrillation: Secondary | ICD-10-CM | POA: Diagnosis not present

## 2017-08-18 DIAGNOSIS — R634 Abnormal weight loss: Secondary | ICD-10-CM | POA: Diagnosis not present

## 2017-08-18 DIAGNOSIS — R630 Anorexia: Secondary | ICD-10-CM | POA: Diagnosis not present

## 2017-08-18 DIAGNOSIS — F0151 Vascular dementia with behavioral disturbance: Secondary | ICD-10-CM | POA: Diagnosis not present

## 2017-08-18 DIAGNOSIS — E46 Unspecified protein-calorie malnutrition: Secondary | ICD-10-CM | POA: Diagnosis not present

## 2017-08-21 DIAGNOSIS — R634 Abnormal weight loss: Secondary | ICD-10-CM | POA: Diagnosis not present

## 2017-08-21 DIAGNOSIS — E46 Unspecified protein-calorie malnutrition: Secondary | ICD-10-CM | POA: Diagnosis not present

## 2017-08-21 DIAGNOSIS — R630 Anorexia: Secondary | ICD-10-CM | POA: Diagnosis not present

## 2017-08-21 DIAGNOSIS — F0151 Vascular dementia with behavioral disturbance: Secondary | ICD-10-CM | POA: Diagnosis not present

## 2017-08-21 DIAGNOSIS — I495 Sick sinus syndrome: Secondary | ICD-10-CM | POA: Diagnosis not present

## 2017-08-21 DIAGNOSIS — I4891 Unspecified atrial fibrillation: Secondary | ICD-10-CM | POA: Diagnosis not present

## 2017-08-22 ENCOUNTER — Encounter: Payer: Self-pay | Admitting: Nurse Practitioner

## 2017-08-22 DIAGNOSIS — E46 Unspecified protein-calorie malnutrition: Secondary | ICD-10-CM | POA: Diagnosis not present

## 2017-08-22 DIAGNOSIS — F0151 Vascular dementia with behavioral disturbance: Secondary | ICD-10-CM | POA: Diagnosis not present

## 2017-08-22 DIAGNOSIS — F02818 Dementia in other diseases classified elsewhere, unspecified severity, with other behavioral disturbance: Secondary | ICD-10-CM

## 2017-08-22 DIAGNOSIS — F419 Anxiety disorder, unspecified: Secondary | ICD-10-CM

## 2017-08-22 DIAGNOSIS — F329 Major depressive disorder, single episode, unspecified: Secondary | ICD-10-CM

## 2017-08-22 DIAGNOSIS — F0281 Dementia in other diseases classified elsewhere with behavioral disturbance: Secondary | ICD-10-CM

## 2017-08-22 DIAGNOSIS — I495 Sick sinus syndrome: Secondary | ICD-10-CM | POA: Diagnosis not present

## 2017-08-22 DIAGNOSIS — R634 Abnormal weight loss: Secondary | ICD-10-CM | POA: Diagnosis not present

## 2017-08-22 DIAGNOSIS — G301 Alzheimer's disease with late onset: Secondary | ICD-10-CM

## 2017-08-22 DIAGNOSIS — I4891 Unspecified atrial fibrillation: Secondary | ICD-10-CM | POA: Diagnosis not present

## 2017-08-22 DIAGNOSIS — R627 Adult failure to thrive: Secondary | ICD-10-CM

## 2017-08-22 DIAGNOSIS — R630 Anorexia: Secondary | ICD-10-CM | POA: Diagnosis not present

## 2017-08-22 NOTE — Assessment & Plan Note (Signed)
She is failure to thrive, comfort measures is her goal of care, Morphine 5mg  q2h prn, Atropine ophthalmic solution q2hr prn, prn Tylenol 650mg  q4h.

## 2017-08-22 NOTE — Progress Notes (Signed)
Location:   SNF [facility]   Place of Service:   SNF Provider: [place] SpringsManXie Kasiya Burck NP  Oneal GroutPandey, Mahima, MD  Patient Care Team: Oneal GroutPandey, Mahima, MD as PCP - General (Internal Medicine) Bartholomew Ramesh X, NP as Nurse Practitioner (Nurse Practitioner) [place] [place], [place] Lowella BandyM Jr., MD as Consulting Physician (Urology) Rachael FeeJacobs, Daniel P, MD as Consulting Physician (Gastroenterology) Salvatore MarvelWainer, Robert, MD as Consulting Physician (Orthopedic Surgery) Venancio PoissonLomax, Laura, MD as Consulting Physician (Dermatology) Sheral ApleyMurphy, Timothy D, MD as Attending Physician (Orthopedic Surgery)  Extended Emergency Contact Information Primary Emergency Contact: Cook,[name] Address: [address]          [place], [place] [address] [place] Home Phone: [phone] Work Phone: [phone] Mobile Phone: [phone] Relation: Son Secondary Emergency Contact: Petrella,[name] Address: [address]          [place], [place] [address] [place] Home Phone: [phone] Relation: Relative  Code Status:  DNR Goals of care: Advanced Directive information Advanced Directives 05/17/2017  Does Patient Have a Medical Advance Directive? Yes  Type of Estate agentAdvance Directive Healthcare Power of [place];Living will;Out of facility DNR (pink MOST or yellow form)  Does patient want to make changes to medical advance directive? No - Patient declined  Copy of Healthcare Power of Attorney in Chart? Yes  Pre-existing out of facility DNR order (yellow form or pink MOST form) Yellow form placed in chart (order not valid for inpatient use);Pink MOST form placed in chart (order not valid for inpatient use)     Chief Complaint  Patient presents with  . Medical Management of Chronic Issues    HPI:  Pt is a 84 y.o. female seen today for medical management of chronic diseases.     The patient has history of dementia, stays in recliner during day, total care for ADLs. Her mood is stable while on Escitalopram 15mg  qd, Lorazepam  1mg  4pm, 0.5mg  12pm. She is failure to thrive, comfort measures is her goal of care, Morphine 5mg  q2h prn, Atropine ophthalmic solution q2hr prn, prn Tylenol 650mg  q4h.    Past Medical History:  Diagnosis Date  . Abnormality of gait 09/08/2003  . AF (atrial fibrillation) (HCC)   . Anxiety state, unspecified 08/19/2010  . Aortic stenosis   . Atrial fibrillation (HCC) 07/14/2009   Qualifier: Diagnosis of  By: Graciela HusbandsKlein, MD, Ruthann CancerFACC, Ty HiltsSteven Cochran   . Bradycardia   . Cardiac pacemaker in situ 09/12/2004  . Closed fracture of lumbar vertebra without mention of spinal cord injury [id number]  . Closed fracture of unspecified trochanteric section of femur 05/29/2011  . Constipation 03/14/2014  . Dementia 2013   04/03/14 MMSE 22/30   . Depression 04/10/2014  . Diffuse cystic mastopathy 12/23/2009  . Disturbance of salivary secretion [id number]  . Edema 08/18/2003  . Fall 12/19/2012   07/22/14 fall VS 144/76, 60, 20, 97.1. No apparent injury   . Flatulence, eructation, and gas pain 12/03/2009  . Fracture of greater trochanter of left femur (HCC) 03/08/14  . FTT (failure to thrive) in adult 12/15/2014  . Hearing loss   . Hemorrhoids   . Hip fracture (HCC)   . HTN (hypertension)   . Hypothyroidism 12/19/2007   Qualifier: Diagnosis of  By: Melvyn NethLewis CMA (AAMA), Patty  01/16/14 TSH 2.474 03/24/14 TSH 2.576    . 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 Loss of weight 10/13/2014   Multiple factorials: dementia, depression, advanced age, possible AR of medications(metformin and Oxybutynin)-will increase  Mirtazapine and continue supplement. Observe.    . Macular degeneration (senile) of retina, unspecified 12/12/2001  . Macular degeneration of both eyes 01/24/2013   Severe visual impairment. Nearly blind.   . Mitral valve prolapse   . Occlusion and stenosis of carotid artery without mention of cerebral infarction 12/27/2007  . Other and unspecified hyperlipidemia   . Other malaise  and fatigue 12/06/2007  . Pacemaker-dual  medtronic 09/07/2011  . Personal history of fall 01/29/2009  . Rectal bleeding   . Senile osteoporosis 12/13/2000  . Sinus node dysfunction (HCC)   . Syncope and collapse 12/27/2007  . Trochanteric fracture of left femur (HCC) 03/14/2014   CT scan did however reveal acute fracture in greater left trochanteric. Dr. Margarita Rana consulted. Recommended nonoperative management and weightbearing as tolerated and another 3 weeks of Lovenox for DVT prophylaxis.       . Type 2 diabetes mellitus with blindness, with macular edema, with severe nonproliferative retinopathy 12/19/2007   Qualifier: Diagnosis of  By: Melvyn Neth CMA (AAMA), Patty  01/09/14 Hgb A1c 6.5 01/16/14 Hgb A1c 6.5 03/24/14 Hgb 6.4 12/10/14 pharm: dc Metformin and CBG monitoring-failure to thrive and refusing to eat.     Marland Kitchen Unspecified hereditary and idiopathic peripheral neuropathy 10/28/202012  . Unspecified hypothyroidism 03/18/2010  . Unspecified urinary incontinence 02/22/2005  . Urinary tract infection, site not specified 04/10/2014  . Xerophthalmus 01/21/2015   Past Surgical History:  Procedure Laterality Date  . BREAST BIOPSY Left 1970   benign  . Left hip surgery  09/2005  . PACEMAKER INSERTION  2006   Medtronic In-Pulse  . Right hip surgery  08/2003  . TONSILLECTOMY    . 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 Codeine     unknown  . Ibuprofen     unknown  . Lisinopril     unknown  . Penicillins     unknown  . Sanctura [Trospium Chloride]     unknown    Allergies as of 08/22/2017      Reactions   Codeine    unknown   Ibuprofen    unknown   Lisinopril    unknown   Penicillins    unknown   Sanctura [trospium Chloride]    unknown      Medication List        Accurate as of 08/22/17 11:59 PM. Always use your most recent med list.          atropine 1 % ophthalmic  solution every 2 (two) hours as needed. Place 4 drops on tongue  for excess secretions   bisacodyl 10 MG suppository Commonly known as:  DULCOLAX Place 10 mg rectally daily as needed for moderate constipation.   escitalopram 10 MG tablet Commonly known as:  LEXAPRO Take 15 mg by mouth daily.   feeding supplement Liqd Commonly known as:  BOOST / RESOURCE BREEZE Take 1 Container by mouth 3 (three) times daily between meals.   LORazepam 1 MG tablet Commonly known as:  ATIVAN Take 1 tablet (1 mg total) by mouth 2 (two) times daily.   morphine 20 MG/5ML solution Take by mouth every 2 (two) hours as needed for pain. Give 0.29ml, sublingual   sennosides-docusate sodium 8.6-50 MG tablet Commonly known as:  SENOKOT-S Take 2 tablets by mouth at bedtime. Reported on 12/11/2015   TYLENOL 325 MG tablet Generic drug:  acetaminophen Take 650 mg by  mouth every 4 (four) hours as needed for mild pain or moderate pain.      ROS was provided with assistance of staff Review of Systems  Constitutional: Negative for activity change, appetite change, chills, diaphoresis, fatigue and fever.  HENT: Positive for hearing loss. Negative for congestion, trouble swallowing and voice change.   Eyes: Negative for visual disturbance.  Respiratory: Negative for cough, chest tightness, shortness of breath and wheezing.   Cardiovascular: Negative for chest pain, palpitations and leg swelling.  Gastrointestinal: Negative for abdominal distention, abdominal pain, constipation, diarrhea, nausea and vomiting.  Endocrine: Negative for cold intolerance.  Genitourinary: Negative for difficulty urinating, dysuria and urgency.       Incontinent of urine.   Musculoskeletal:       Non ambulatory  Skin: Negative for color change, pallor, rash and wound.  Neurological: Negative for tremors, weakness and headaches.  Psychiatric/Behavioral: Positive for behavioral problems and confusion. Negative for agitation,  hallucinations and sleep disturbance. The patient is not nervous/anxious.     Immunization History  Administered Date(s) Administered  . Influenza Whole 05/10/2017  . Influenza-Unspecified 06/06/2013, 04/21/2015, 05/17/2016  . PPD Test 07/23/2011  . Pneumococcal Conjugate-13 03/14/2009  . Pneumococcal Polysaccharide-23 03/14/2009  . Td 03/14/2009  . Tdap 03/14/2009  . Zoster 06/29/2006, 06/29/2006   Pertinent  Health Maintenance Due  Topic Date Due  . OPHTHALMOLOGY EXAM  07/28/1927  . DEXA SCAN  07/27/1982  . PNA vac Low Risk Adult (2 of 2 - PCV13) 03/14/2010  . HEMOGLOBIN A1C  06/08/2017  . FOOT EXAM  12/05/2017  . URINE MICROALBUMIN  12/06/2017  . INFLUENZA VACCINE  Completed   Fall Risk  03/30/2017 01/26/2015 03/06/2014  Falls in the past year? No Yes Yes  Number falls in past yr: - 1 1  Comment - - 03/06/14  Injury with Fall? - Yes Yes  Comment - - left hip   Risk Factor Category  - - High Fall Risk  Risk for fall due to : - History of fall(s);Impaired balance/gait;Impaired mobility -  Follow up - Falls evaluation completed -   Functional Status Survey:    There were no vitals filed for this visit. There is no height or weight on file to calculate BMI. Physical Exam  Constitutional: She appears well-developed and well-nourished. No distress.  HENT:  Head: Normocephalic and atraumatic.  Eyes: Conjunctivae and EOM are normal. Pupils are equal, round, and reactive to light.  Neck: Normal range of motion. Neck supple. No JVD present. No thyromegaly present.  Cardiovascular: Normal rate, regular rhythm and normal heart sounds.  No murmur heard. Pulmonary/Chest: Effort normal and breath sounds normal. She has no wheezes. She has no rales.  Abdominal: Soft. Bowel sounds are normal. There is no tenderness.  Musculoskeletal: She exhibits no edema or tenderness.  Stays in recliner once out of bed  Neurological: She is alert. She exhibits abnormal muscle tone. Coordination  normal.  Skin: Skin is warm and dry. No rash noted. She is not diaphoretic. No erythema.  Psychiatric: She has a normal mood and affect. Her behavior is normal.    Labs reviewed: Recent Labs    12/06/16  NA 142  K 3.9  BUN 24*  CREATININE 0.9   Recent Labs    12/06/16  AST 21  ALT 11  ALKPHOS 70   No results for input(s): WBC, NEUTROABS, HGB, HCT, MCV, PLT in the last 8760 hours. Lab Results  Component Value Date   TSH 3.55 01/27/2015  Lab Results  Component Value Date   HGBA1C 5.2 12/06/2016   Lab Results  Component Value Date   CHOL 140 12/20/2012   HDL 38 12/20/2012   LDLCALC 57 12/20/2012   TRIG 227 (A) 12/20/2012   CHOLHDL 5.0 12/22/2007    Significant Diagnostic Results in last 30 days:  No results found.  Assessment/Plan  Alzheimer's dementia with behavioral disturbance dementia, stays in recliner during day, total care for ADLs.    Anxiety and depression Her mood is stable, continue Escitalopram 15mg  qd, Lorazepam 0.5mg  116m, 1mg  4pm  FTT (failure to thrive) in adult She is failure to thrive, comfort measures is her goal of care, Morphine 5mg  q2h prn, Atropine ophthalmic solution q2hr prn, prn Tylenol 650mg  q4h.   Family/ staff Communication: plan of care reviewed with the patient and charge nurse.   Labs/tests ordered:  none  Time spend 25 minutes   This encounter was created in error - please disregard.

## 2017-08-22 NOTE — Assessment & Plan Note (Addendum)
Her mood is stable, continue Escitalopram 15mg  qd, Lorazepam 0.5mg  12967m, 1mg  4pm

## 2017-08-22 NOTE — Assessment & Plan Note (Signed)
dementia, stays in recliner during day, total care for ADLs.

## 2017-08-23 DIAGNOSIS — R634 Abnormal weight loss: Secondary | ICD-10-CM | POA: Diagnosis not present

## 2017-08-23 DIAGNOSIS — F0151 Vascular dementia with behavioral disturbance: Secondary | ICD-10-CM | POA: Diagnosis not present

## 2017-08-23 DIAGNOSIS — R630 Anorexia: Secondary | ICD-10-CM | POA: Diagnosis not present

## 2017-08-23 DIAGNOSIS — I4891 Unspecified atrial fibrillation: Secondary | ICD-10-CM | POA: Diagnosis not present

## 2017-08-23 DIAGNOSIS — E46 Unspecified protein-calorie malnutrition: Secondary | ICD-10-CM | POA: Diagnosis not present

## 2017-08-23 DIAGNOSIS — I495 Sick sinus syndrome: Secondary | ICD-10-CM | POA: Diagnosis not present

## 2017-08-24 ENCOUNTER — Encounter: Payer: Self-pay | Admitting: Internal Medicine

## 2017-08-24 ENCOUNTER — Non-Acute Institutional Stay (SKILLED_NURSING_FACILITY): Payer: Medicare Other | Admitting: Internal Medicine

## 2017-08-24 ENCOUNTER — Encounter: Payer: Self-pay | Admitting: Nurse Practitioner

## 2017-08-24 DIAGNOSIS — E43 Unspecified severe protein-calorie malnutrition: Secondary | ICD-10-CM | POA: Diagnosis not present

## 2017-08-24 DIAGNOSIS — K5904 Chronic idiopathic constipation: Secondary | ICD-10-CM

## 2017-08-24 DIAGNOSIS — F418 Other specified anxiety disorders: Secondary | ICD-10-CM

## 2017-08-24 NOTE — Progress Notes (Signed)
Location:  [facility] Room Number: 30 Place of Service:  SNF (31) Provider:  Oneal Grout MD  Oneal Grout, MD  Patient Care Team: Oneal Grout, MD as PCP - General (Internal Medicine) Mast, Man X, NP as Nurse Practitioner (Nurse Practitioner) [place] [place], [place] Lowella Bandy., MD as Consulting Physician (Urology) Rachael Fee, MD as Consulting Physician (Gastroenterology) Salvatore Marvel, MD as Consulting Physician (Orthopedic Surgery) Venancio Poisson, MD as Consulting Physician (Dermatology) Sheral Apley, MD as Attending Physician (Orthopedic Surgery)  Extended Emergency Contact Information Primary Emergency Contact: Cook,[name] Address: [address]          [place], [place] [address] [place] of [place] Home Phone: [phone] Work Phone: [phone] Mobile Phone: [phone] Relation: Son Secondary Emergency Contact: Grawe,[name] Address: [address]          [place], [place] [address] [place] of [place] Home Phone: [phone] Relation: Relative  Code Status:  DNR  Goals of care: Advanced Directive information Advanced Directives 2020/07/3018  Does Patient Have a Medical Advance Directive? Yes  Type of Advance Directive Living will;Out of facility DNR (pink MOST or yellow form)  Does patient want to make changes to medical advance directive? No - Patient declined  Copy of Healthcare Power of Attorney in Chart? -  Pre-existing out of facility DNR order (yellow form or pink MOST form) Yellow form placed in chart (order not valid for inpatient use);Pink MOST form placed in chart (order not valid for inpatient use)     Chief Complaint  Patient presents with  . Medical Management of Chronic Issues    Routine Visit     HPI:  Pt is a 84 y.o. female seen today for medical management of chronic diseases.  She does not participate in HPI and ROS with her advanced dementia. She is sitting on the geri chair. She is under  total care. She continues to yell at times but overall behavior stable per nursing. No acute concern reported by charge nurse. She bruises easily and gets skin tears   Past Medical History:  Diagnosis Date  . Abnormality of gait 09/08/2003  . AF (atrial fibrillation) (HCC)   . Anxiety state, unspecified 08/19/2010  . Aortic stenosis   . Atrial fibrillation (HCC) 07/14/2009   Qualifier: Diagnosis of  By: [name], MD, Ruthann Cancer Ty Hilts   . Bradycardia   . Cardiac pacemaker in situ 09/12/2004  . Closed fracture of lumbar vertebra without mention of spinal cord injury 12020/07/3009  . Closed fracture of unspecified trochanteric section of femur 05/29/2011  . Constipation 03/14/2014  . Dementia 2013   04/03/14 MMSE 22/30   . Depression 04/10/2014  . Diffuse cystic mastopathy 12/23/2009  . Disturbance of salivary secretion [id number]  . Edema 08/18/2003  . Fall 12/19/2012   07/22/14 fall VS 144/76, 60, 20, 97.1. No apparent injury   . Flatulence, eructation, and gas pain 12/03/2009  . Fracture of greater trochanter of left femur (HCC) 03/08/14  . FTT (failure to thrive) in adult 12/15/2014  . Hearing loss   . Hemorrhoids   . Hip fracture (HCC)   . HTN (hypertension)   . Hypothyroidism 12/19/2007   Qualifier: Diagnosis of  By: Melvyn Neth CMA (AAMA), Patty  01/16/14 TSH 2.474 03/24/14 TSH 2.576    . 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 Loss of weight 10/13/2014   Multiple factorials: dementia, depression, advanced age, possible AR of medications(metformin and Oxybutynin)-will increase  Mirtazapine and continue supplement. Observe.    . Macular degeneration (senile) of retina, unspecified 12/12/2001  . Macular degeneration of both eyes 01/24/2013   Severe visual impairment. Nearly blind.   . Mitral valve prolapse   . Occlusion and stenosis of carotid artery without mention of cerebral infarction 12/27/2007  . Other and unspecified hyperlipidemia   . Other malaise and  fatigue 12/06/2007  . Pacemaker-dual  medtronic 09/07/2011  . Personal history of fall 01/29/2009  . Rectal bleeding   . Senile osteoporosis 12/13/2000  . Sinus node dysfunction (HCC)   . Syncope and collapse 12/27/2007  . Trochanteric fracture of left femur (HCC) 03/14/2014   CT scan did however reveal acute fracture in greater left trochanteric. Dr. Margarita Ranaimothy Murphy consulted. Recommended nonoperative management and weightbearing as tolerated and another 3 weeks of Lovenox for DVT prophylaxis.       . Type 2 diabetes mellitus with blindness, with macular edema, with severe nonproliferative retinopathy 12/19/2007   Qualifier: Diagnosis of  By: Melvyn NethLewis CMA (AAMA), Patty  01/09/14 Hgb A1c 6.5 01/16/14 Hgb A1c 6.5 03/24/14 Hgb 6.4 12/10/14 pharm: dc Metformin and CBG monitoring-failure to thrive and refusing to eat.     Marland Kitchen. Unspecified hereditary and idiopathic peripheral neuropathy 10/28/202012  . Unspecified hypothyroidism 03/18/2010  . Unspecified urinary incontinence 02/22/2005  . Urinary tract infection, site not specified 04/10/2014  . Xerophthalmus 01/21/2015   Past Surgical History:  Procedure Laterality Date  . BREAST BIOPSY Left 1970   benign  . Left hip surgery  09/2005  . PACEMAKER INSERTION  2006   Medtronic In-Pulse  . Right hip surgery  08/2003  . TONSILLECTOMY    . 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 Codeine     unknown  . Ibuprofen     unknown  . Lisinopril     unknown  . Penicillins     unknown  . Sanctura [Trospium Chloride]     unknown    Outpatient Encounter Medications as of Mar 11, 202019  Medication Sig  . acetaminophen (TYLENOL) 325 MG tablet Take 650 mg by mouth every 4 (four) hours as needed for mild pain or moderate pain.   Marland Kitchen. atropine 1 % ophthalmic solution every 2 (two) hours as needed. Place 4 drops on tongue  for excess secretions  . bisacodyl (DULCOLAX) 10 MG  suppository Place 10 mg rectally daily as needed for moderate constipation.   Marland Kitchen. escitalopram (LEXAPRO) 10 MG tablet Take 10 mg by mouth daily.   . feeding supplement (BOOST / RESOURCE BREEZE) LIQD Take 1 Container by mouth 3 (three) times daily between meals.  Marland Kitchen. LORazepam (ATIVAN) 0.5 MG tablet Take 0.5 mg by mouth daily. 12 PM  . LORazepam (ATIVAN) 1 MG tablet Take 1 mg by mouth daily. 4 PM  . morphine 20 MG/5ML solution Take by mouth every 2 (two) hours as needed for pain. Give 0.8025ml, sublingual  . sennosides-docusate sodium (SENOKOT-S) 8.6-50 MG tablet Take 2 tablets by mouth at bedtime. Reported on 12/11/2015  . [DISCONTINUED] LORazepam (ATIVAN) 1 MG tablet Take 1 tablet (1 mg total) by mouth 2 (two) times daily.   No facility-administered encounter medications on file as of Mar 11, 202019.     Review of Systems  Unable to perform ROS: Dementia    Immunization History  Administered Date(s) Administered  . Influenza Whole 05/10/2017  . Influenza-Unspecified 06/06/2013, 04/21/2015, 05/17/2016  . PPD Test  07/23/2011  . Pneumococcal Conjugate-13 03/14/2009  . Pneumococcal Polysaccharide-23 03/14/2009  . Td 03/14/2009  . Tdap 03/14/2009  . Zoster 06/29/2006, 06/29/2006   Pertinent  Health Maintenance Due  Topic Date Due  . OPHTHALMOLOGY EXAM  07/28/1927  . DEXA SCAN  07/27/1982  . PNA vac Low Risk Adult (2 of 2 - PCV13) 03/14/2010  . HEMOGLOBIN A1C  06/08/2017  . FOOT EXAM  12/05/2017  . URINE MICROALBUMIN  12/06/2017  . INFLUENZA VACCINE  Completed   Fall Risk  03/30/2017 01/26/2015 03/06/2014  Falls in the past year? No Yes Yes  Number falls in past yr: - 1 1  Comment - - 03/06/14  Injury with Fall? - Yes Yes  Comment - - left hip   Risk Factor Category  - - High Fall Risk  Risk for fall due to : - History of fall(s);Impaired balance/gait;Impaired mobility -  Follow up - Falls evaluation completed -   Functional Status Survey:    Vitals:   08/24/17 1149  BP: 122/78  Pulse:  60  Resp: 16  Temp: 97.8 F (36.6 C)  TempSrc: Oral  SpO2: 94%  Weight: 83 lb (37.6 kg)  Height: 5\' 2"  (1.575 m)   Body mass index is 15.18 kg/m.   Wt Readings from Last 3 Encounters:  08/24/17 83 lb (37.6 kg)  07/21/17 83 lb 8 oz (37.9 kg)  06/29/17 82 lb 1.6 oz (37.2 kg)   Physical Exam  Constitutional: No distress.  Thin built, frail, elderly female  HENT:  Head: Normocephalic and atraumatic.  Mouth/Throat: Oropharynx is clear and moist.  Eyes: Conjunctivae are normal. Pupils are equal, round, and reactive to light. Right eye exhibits no discharge. Left eye exhibits no discharge.  Neck: Neck supple.  Cardiovascular: Normal rate and regular rhythm.  Murmur heard. Pulmonary/Chest: Effort normal and breath sounds normal. She has no wheezes. She has no rales.  Abdominal: Soft. Bowel sounds are normal. There is no tenderness.  Musculoskeletal: She exhibits deformity. She exhibits no edema.  Has contractures, total care with ADLs.  Lymphadenopathy:    She has no cervical adenopathy.  Neurological: She is alert.  Unable to express her needs, under total care, oriented only to self  Skin: Skin is warm and dry. She is not diaphoretic.    Labs reviewed: Recent Labs    12/06/16  NA 142  K 3.9  BUN 24*  CREATININE 0.9   Recent Labs    12/06/16  AST 21  ALT 11  ALKPHOS 70   No results for input(s): WBC, NEUTROABS, HGB, HCT, MCV, PLT in the last 8760 hours. Lab Results  Component Value Date   TSH 3.55 01/27/2015   Lab Results  Component Value Date   HGBA1C 5.2 12/06/2016   Lab Results  Component Value Date   CHOL 140 12/20/2012   HDL 38 12/20/2012   LDLCALC 57 12/20/2012   TRIG 227 (A) 12/20/2012   CHOLHDL 5.0 12/22/2007    Significant Diagnostic Results in last 30 days:  No results found.  Assessment/Plan  Slow transit constipation Continue senokot s 2 tab qhs and prn dulcolax.   Depression and anxiety With dementia, Stable mood, continue  escitalopram 10 mg daily and lorazepam current regimen.   Protein calorie malnutrition Low but stable weight. Continue feeding supplement.  Family/ staff Communication: reviewed care plan with patient and charge nurse.   Labs/tests ordered:  none   Oneal Grout, MD Internal Medicine [facility] [phone]  [address] [place], [place] [address] Cell Phone (Monday-Friday 8 am - 5 pm): [phone] On Call: [phone] and follow prompts after 5 pm and on weekends Office Phone: [phone] Office Fax: [phone]

## 2017-08-25 DIAGNOSIS — I4891 Unspecified atrial fibrillation: Secondary | ICD-10-CM | POA: Diagnosis not present

## 2017-08-25 DIAGNOSIS — E46 Unspecified protein-calorie malnutrition: Secondary | ICD-10-CM | POA: Diagnosis not present

## 2017-08-25 DIAGNOSIS — F0151 Vascular dementia with behavioral disturbance: Secondary | ICD-10-CM | POA: Diagnosis not present

## 2017-08-25 DIAGNOSIS — I495 Sick sinus syndrome: Secondary | ICD-10-CM | POA: Diagnosis not present

## 2017-08-25 DIAGNOSIS — R634 Abnormal weight loss: Secondary | ICD-10-CM | POA: Diagnosis not present

## 2017-08-25 DIAGNOSIS — R630 Anorexia: Secondary | ICD-10-CM | POA: Diagnosis not present

## 2017-08-28 DIAGNOSIS — I4891 Unspecified atrial fibrillation: Secondary | ICD-10-CM | POA: Diagnosis not present

## 2017-08-28 DIAGNOSIS — I495 Sick sinus syndrome: Secondary | ICD-10-CM | POA: Diagnosis not present

## 2017-08-28 DIAGNOSIS — E46 Unspecified protein-calorie malnutrition: Secondary | ICD-10-CM | POA: Diagnosis not present

## 2017-08-28 DIAGNOSIS — R630 Anorexia: Secondary | ICD-10-CM | POA: Diagnosis not present

## 2017-08-28 DIAGNOSIS — F0151 Vascular dementia with behavioral disturbance: Secondary | ICD-10-CM | POA: Diagnosis not present

## 2017-08-28 DIAGNOSIS — R634 Abnormal weight loss: Secondary | ICD-10-CM | POA: Diagnosis not present

## 2017-08-30 DIAGNOSIS — R634 Abnormal weight loss: Secondary | ICD-10-CM | POA: Diagnosis not present

## 2017-08-30 DIAGNOSIS — E46 Unspecified protein-calorie malnutrition: Secondary | ICD-10-CM | POA: Diagnosis not present

## 2017-08-30 DIAGNOSIS — F0151 Vascular dementia with behavioral disturbance: Secondary | ICD-10-CM | POA: Diagnosis not present

## 2017-08-30 DIAGNOSIS — I495 Sick sinus syndrome: Secondary | ICD-10-CM | POA: Diagnosis not present

## 2017-08-30 DIAGNOSIS — I4891 Unspecified atrial fibrillation: Secondary | ICD-10-CM | POA: Diagnosis not present

## 2017-08-30 DIAGNOSIS — R630 Anorexia: Secondary | ICD-10-CM | POA: Diagnosis not present

## 2017-08-31 DIAGNOSIS — I495 Sick sinus syndrome: Secondary | ICD-10-CM | POA: Diagnosis not present

## 2017-08-31 DIAGNOSIS — I4891 Unspecified atrial fibrillation: Secondary | ICD-10-CM | POA: Diagnosis not present

## 2017-08-31 DIAGNOSIS — R630 Anorexia: Secondary | ICD-10-CM | POA: Diagnosis not present

## 2017-08-31 DIAGNOSIS — R634 Abnormal weight loss: Secondary | ICD-10-CM | POA: Diagnosis not present

## 2017-08-31 DIAGNOSIS — E46 Unspecified protein-calorie malnutrition: Secondary | ICD-10-CM | POA: Diagnosis not present

## 2017-08-31 DIAGNOSIS — F0151 Vascular dementia with behavioral disturbance: Secondary | ICD-10-CM | POA: Diagnosis not present

## 2017-09-01 DIAGNOSIS — R630 Anorexia: Secondary | ICD-10-CM | POA: Diagnosis not present

## 2017-09-01 DIAGNOSIS — E785 Hyperlipidemia, unspecified: Secondary | ICD-10-CM | POA: Diagnosis not present

## 2017-09-01 DIAGNOSIS — M84359S Stress fracture, hip, unspecified, sequela: Secondary | ICD-10-CM | POA: Diagnosis not present

## 2017-09-01 DIAGNOSIS — I4891 Unspecified atrial fibrillation: Secondary | ICD-10-CM | POA: Diagnosis not present

## 2017-09-01 DIAGNOSIS — E039 Hypothyroidism, unspecified: Secondary | ICD-10-CM | POA: Diagnosis not present

## 2017-09-01 DIAGNOSIS — E46 Unspecified protein-calorie malnutrition: Secondary | ICD-10-CM | POA: Diagnosis not present

## 2017-09-01 DIAGNOSIS — I10 Essential (primary) hypertension: Secondary | ICD-10-CM | POA: Diagnosis not present

## 2017-09-01 DIAGNOSIS — I495 Sick sinus syndrome: Secondary | ICD-10-CM | POA: Diagnosis not present

## 2017-09-01 DIAGNOSIS — R634 Abnormal weight loss: Secondary | ICD-10-CM | POA: Diagnosis not present

## 2017-09-01 DIAGNOSIS — F0151 Vascular dementia with behavioral disturbance: Secondary | ICD-10-CM | POA: Diagnosis not present

## 2017-09-01 DIAGNOSIS — E1159 Type 2 diabetes mellitus with other circulatory complications: Secondary | ICD-10-CM | POA: Diagnosis not present

## 2017-09-01 DIAGNOSIS — H3530 Unspecified macular degeneration: Secondary | ICD-10-CM | POA: Diagnosis not present

## 2017-09-01 DIAGNOSIS — F339 Major depressive disorder, recurrent, unspecified: Secondary | ICD-10-CM | POA: Diagnosis not present

## 2017-09-04 DIAGNOSIS — I4891 Unspecified atrial fibrillation: Secondary | ICD-10-CM | POA: Diagnosis not present

## 2017-09-04 DIAGNOSIS — F0151 Vascular dementia with behavioral disturbance: Secondary | ICD-10-CM | POA: Diagnosis not present

## 2017-09-04 DIAGNOSIS — R630 Anorexia: Secondary | ICD-10-CM | POA: Diagnosis not present

## 2017-09-04 DIAGNOSIS — R634 Abnormal weight loss: Secondary | ICD-10-CM | POA: Diagnosis not present

## 2017-09-04 DIAGNOSIS — I495 Sick sinus syndrome: Secondary | ICD-10-CM | POA: Diagnosis not present

## 2017-09-04 DIAGNOSIS — E46 Unspecified protein-calorie malnutrition: Secondary | ICD-10-CM | POA: Diagnosis not present

## 2017-09-05 DIAGNOSIS — E46 Unspecified protein-calorie malnutrition: Secondary | ICD-10-CM | POA: Diagnosis not present

## 2017-09-05 DIAGNOSIS — I4891 Unspecified atrial fibrillation: Secondary | ICD-10-CM | POA: Diagnosis not present

## 2017-09-05 DIAGNOSIS — F0151 Vascular dementia with behavioral disturbance: Secondary | ICD-10-CM | POA: Diagnosis not present

## 2017-09-05 DIAGNOSIS — R630 Anorexia: Secondary | ICD-10-CM | POA: Diagnosis not present

## 2017-09-05 DIAGNOSIS — I495 Sick sinus syndrome: Secondary | ICD-10-CM | POA: Diagnosis not present

## 2017-09-05 DIAGNOSIS — R634 Abnormal weight loss: Secondary | ICD-10-CM | POA: Diagnosis not present

## 2017-09-06 DIAGNOSIS — I495 Sick sinus syndrome: Secondary | ICD-10-CM | POA: Diagnosis not present

## 2017-09-06 DIAGNOSIS — R630 Anorexia: Secondary | ICD-10-CM | POA: Diagnosis not present

## 2017-09-06 DIAGNOSIS — R634 Abnormal weight loss: Secondary | ICD-10-CM | POA: Diagnosis not present

## 2017-09-06 DIAGNOSIS — I4891 Unspecified atrial fibrillation: Secondary | ICD-10-CM | POA: Diagnosis not present

## 2017-09-06 DIAGNOSIS — F0151 Vascular dementia with behavioral disturbance: Secondary | ICD-10-CM | POA: Diagnosis not present

## 2017-09-06 DIAGNOSIS — E46 Unspecified protein-calorie malnutrition: Secondary | ICD-10-CM | POA: Diagnosis not present

## 2017-09-07 DIAGNOSIS — E46 Unspecified protein-calorie malnutrition: Secondary | ICD-10-CM | POA: Diagnosis not present

## 2017-09-07 DIAGNOSIS — R634 Abnormal weight loss: Secondary | ICD-10-CM | POA: Diagnosis not present

## 2017-09-07 DIAGNOSIS — F0151 Vascular dementia with behavioral disturbance: Secondary | ICD-10-CM | POA: Diagnosis not present

## 2017-09-07 DIAGNOSIS — I4891 Unspecified atrial fibrillation: Secondary | ICD-10-CM | POA: Diagnosis not present

## 2017-09-07 DIAGNOSIS — R630 Anorexia: Secondary | ICD-10-CM | POA: Diagnosis not present

## 2017-09-07 DIAGNOSIS — I495 Sick sinus syndrome: Secondary | ICD-10-CM | POA: Diagnosis not present

## 2017-09-08 DIAGNOSIS — R634 Abnormal weight loss: Secondary | ICD-10-CM | POA: Diagnosis not present

## 2017-09-08 DIAGNOSIS — I4891 Unspecified atrial fibrillation: Secondary | ICD-10-CM | POA: Diagnosis not present

## 2017-09-08 DIAGNOSIS — F0151 Vascular dementia with behavioral disturbance: Secondary | ICD-10-CM | POA: Diagnosis not present

## 2017-09-08 DIAGNOSIS — R630 Anorexia: Secondary | ICD-10-CM | POA: Diagnosis not present

## 2017-09-08 DIAGNOSIS — I495 Sick sinus syndrome: Secondary | ICD-10-CM | POA: Diagnosis not present

## 2017-09-08 DIAGNOSIS — E46 Unspecified protein-calorie malnutrition: Secondary | ICD-10-CM | POA: Diagnosis not present

## 2017-09-11 DIAGNOSIS — I4891 Unspecified atrial fibrillation: Secondary | ICD-10-CM | POA: Diagnosis not present

## 2017-09-11 DIAGNOSIS — E46 Unspecified protein-calorie malnutrition: Secondary | ICD-10-CM | POA: Diagnosis not present

## 2017-09-11 DIAGNOSIS — R634 Abnormal weight loss: Secondary | ICD-10-CM | POA: Diagnosis not present

## 2017-09-11 DIAGNOSIS — F0151 Vascular dementia with behavioral disturbance: Secondary | ICD-10-CM | POA: Diagnosis not present

## 2017-09-11 DIAGNOSIS — I495 Sick sinus syndrome: Secondary | ICD-10-CM | POA: Diagnosis not present

## 2017-09-11 DIAGNOSIS — R630 Anorexia: Secondary | ICD-10-CM | POA: Diagnosis not present

## 2017-09-13 DIAGNOSIS — F0151 Vascular dementia with behavioral disturbance: Secondary | ICD-10-CM | POA: Diagnosis not present

## 2017-09-13 DIAGNOSIS — I4891 Unspecified atrial fibrillation: Secondary | ICD-10-CM | POA: Diagnosis not present

## 2017-09-13 DIAGNOSIS — E46 Unspecified protein-calorie malnutrition: Secondary | ICD-10-CM | POA: Diagnosis not present

## 2017-09-13 DIAGNOSIS — I495 Sick sinus syndrome: Secondary | ICD-10-CM | POA: Diagnosis not present

## 2017-09-13 DIAGNOSIS — R630 Anorexia: Secondary | ICD-10-CM | POA: Diagnosis not present

## 2017-09-13 DIAGNOSIS — R634 Abnormal weight loss: Secondary | ICD-10-CM | POA: Diagnosis not present

## 2017-09-15 DIAGNOSIS — R630 Anorexia: Secondary | ICD-10-CM | POA: Diagnosis not present

## 2017-09-15 DIAGNOSIS — I495 Sick sinus syndrome: Secondary | ICD-10-CM | POA: Diagnosis not present

## 2017-09-15 DIAGNOSIS — I4891 Unspecified atrial fibrillation: Secondary | ICD-10-CM | POA: Diagnosis not present

## 2017-09-15 DIAGNOSIS — R634 Abnormal weight loss: Secondary | ICD-10-CM | POA: Diagnosis not present

## 2017-09-15 DIAGNOSIS — E46 Unspecified protein-calorie malnutrition: Secondary | ICD-10-CM | POA: Diagnosis not present

## 2017-09-15 DIAGNOSIS — F0151 Vascular dementia with behavioral disturbance: Secondary | ICD-10-CM | POA: Diagnosis not present

## 2017-09-18 DIAGNOSIS — R634 Abnormal weight loss: Secondary | ICD-10-CM | POA: Diagnosis not present

## 2017-09-18 DIAGNOSIS — F0151 Vascular dementia with behavioral disturbance: Secondary | ICD-10-CM | POA: Diagnosis not present

## 2017-09-18 DIAGNOSIS — I495 Sick sinus syndrome: Secondary | ICD-10-CM | POA: Diagnosis not present

## 2017-09-18 DIAGNOSIS — E46 Unspecified protein-calorie malnutrition: Secondary | ICD-10-CM | POA: Diagnosis not present

## 2017-09-18 DIAGNOSIS — R630 Anorexia: Secondary | ICD-10-CM | POA: Diagnosis not present

## 2017-09-18 DIAGNOSIS — I4891 Unspecified atrial fibrillation: Secondary | ICD-10-CM | POA: Diagnosis not present

## 2017-09-20 DIAGNOSIS — E46 Unspecified protein-calorie malnutrition: Secondary | ICD-10-CM | POA: Diagnosis not present

## 2017-09-20 DIAGNOSIS — R634 Abnormal weight loss: Secondary | ICD-10-CM | POA: Diagnosis not present

## 2017-09-20 DIAGNOSIS — F0151 Vascular dementia with behavioral disturbance: Secondary | ICD-10-CM | POA: Diagnosis not present

## 2017-09-20 DIAGNOSIS — R630 Anorexia: Secondary | ICD-10-CM | POA: Diagnosis not present

## 2017-09-20 DIAGNOSIS — I4891 Unspecified atrial fibrillation: Secondary | ICD-10-CM | POA: Diagnosis not present

## 2017-09-20 DIAGNOSIS — I495 Sick sinus syndrome: Secondary | ICD-10-CM | POA: Diagnosis not present

## 2017-09-22 ENCOUNTER — Non-Acute Institutional Stay (SKILLED_NURSING_FACILITY): Payer: Medicare Other | Admitting: Nurse Practitioner

## 2017-09-22 ENCOUNTER — Encounter: Payer: Self-pay | Admitting: Nurse Practitioner

## 2017-09-22 DIAGNOSIS — F0151 Vascular dementia with behavioral disturbance: Secondary | ICD-10-CM | POA: Diagnosis not present

## 2017-09-22 DIAGNOSIS — K5904 Chronic idiopathic constipation: Secondary | ICD-10-CM

## 2017-09-22 DIAGNOSIS — F0281 Dementia in other diseases classified elsewhere with behavioral disturbance: Secondary | ICD-10-CM | POA: Diagnosis not present

## 2017-09-22 DIAGNOSIS — I495 Sick sinus syndrome: Secondary | ICD-10-CM | POA: Diagnosis not present

## 2017-09-22 DIAGNOSIS — G301 Alzheimer's disease with late onset: Secondary | ICD-10-CM | POA: Diagnosis not present

## 2017-09-22 DIAGNOSIS — R634 Abnormal weight loss: Secondary | ICD-10-CM | POA: Diagnosis not present

## 2017-09-22 DIAGNOSIS — F418 Other specified anxiety disorders: Secondary | ICD-10-CM

## 2017-09-22 DIAGNOSIS — I4891 Unspecified atrial fibrillation: Secondary | ICD-10-CM | POA: Diagnosis not present

## 2017-09-22 DIAGNOSIS — E46 Unspecified protein-calorie malnutrition: Secondary | ICD-10-CM | POA: Diagnosis not present

## 2017-09-22 DIAGNOSIS — R630 Anorexia: Secondary | ICD-10-CM | POA: Diagnosis not present

## 2017-09-22 DIAGNOSIS — F02818 Dementia in other diseases classified elsewhere, unspecified severity, with other behavioral disturbance: Secondary | ICD-10-CM

## 2017-09-22 NOTE — Progress Notes (Signed)
Location:   [facility] Room Number: 30 Place of Service:  SNF (31) Provider: Arna Snipe Santhosh Gulino NP  Oneal Grout, MD  Patient Care Team: Oneal Grout, MD as PCP - General (Internal Medicine) Analissa Bayless X, NP as Nurse Practitioner (Nurse Practitioner) [place] [place], [place] Lowella Bandy., MD as Consulting Physician (Urology) Rachael Fee, MD as Consulting Physician (Gastroenterology) Salvatore Marvel, MD as Consulting Physician (Orthopedic Surgery) Venancio Poisson, MD as Consulting Physician (Dermatology) Sheral Apley, MD as Attending Physician (Orthopedic Surgery)  Extended Emergency Contact Information Primary Emergency Contact: Cook,[name] Address: [address]          [place], [place] [address] [place] of [place] Home Phone: [phone] Work Phone: [phone] Mobile Phone: [phone] Relation: Son Secondary Emergency Contact: Canter,[name] Address: [address]          [place], [place] [address] [place] of [place] Home Phone: [phone] Relation: Relative  Code Status:  DNR Goals of care: Advanced Directive information Advanced Directives 2020-04-1218  Does Patient Have a Medical Advance Directive? Yes  Type of Advance Directive Living will;Out of facility DNR (pink MOST or yellow form)  Does patient want to make changes to medical advance directive? No - Patient declined  Copy of Healthcare Power of Attorney in Chart? -  Pre-existing out of facility DNR order (yellow form or pink MOST form) Yellow form placed in chart (order not valid for inpatient use);Pink MOST form placed in chart (order not valid for inpatient use)     Chief Complaint  Patient presents with  . Medical Management of Chronic Issues    HPI:  Pt is a 84 y.o. female seen today for medical management of chronic diseases.    Hx of dementia, total care of ADLs, recliner once out of bed, legally blind, under Hospice service for comfort measures, scheduled Lorazepam and prn  Morphine available to her. Her mood is stable on Escitalopram 10mg  daily. No constipation while taking Senokot II daily.   Past Medical History:  Diagnosis Date  . Abnormality of gait 09/08/2003  . AF (atrial fibrillation) (HCC)   . Anxiety state, unspecified 08/19/2010  . Aortic stenosis   . Atrial fibrillation (HCC) 07/14/2009   Qualifier: Diagnosis of  By: [name], MD, Ruthann Cancer Ty Hilts   . Bradycardia   . Cardiac pacemaker in situ 09/12/2004  . Closed fracture of lumbar vertebra without mention of spinal cord injury 12020-04-1209  . Closed fracture of unspecified trochanteric section of femur 05/29/2011  . Constipation 03/14/2014  . Dementia 2013   04/03/14 MMSE 22/30   . Depression 04/10/2014  . Diffuse cystic mastopathy 12/23/2009  . Disturbance of salivary secretion [id number]  . Edema 08/18/2003  . Fall 12/19/2012   07/22/14 fall VS 144/76, 60, 20, 97.1. No apparent injury   . Flatulence, eructation, and gas pain 12/03/2009  . Fracture of greater trochanter of left femur (HCC) 03/08/14  . FTT (failure to thrive) in adult 12/15/2014  . Hearing loss   . Hemorrhoids   . Hip fracture (HCC)   . HTN (hypertension)   . Hypothyroidism 12/19/2007   Qualifier: Diagnosis of  By: Melvyn Neth CMA (AAMA), Patty  01/16/14 TSH 2.474 03/24/14 TSH 2.576    . 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 Loss of weight 10/13/2014   Multiple factorials: dementia, depression, advanced age, possible AR of medications(metformin and Oxybutynin)-will increase Mirtazapine and continue supplement. Observe.    . Macular degeneration (senile) of  retina, unspecified 12/12/2001  . Macular degeneration of both eyes 01/24/2013   Severe visual impairment. Nearly blind.   . Mitral valve prolapse   . Occlusion and stenosis of carotid artery without mention of cerebral infarction 12/27/2007  . Other and unspecified hyperlipidemia   . Other malaise and fatigue 12/06/2007  . Pacemaker-dual  medtronic  09/07/2011  . Personal history of fall 01/29/2009  . Rectal bleeding   . Senile osteoporosis 12/13/2000  . Sinus node dysfunction (HCC)   . Syncope and collapse 12/27/2007  . Trochanteric fracture of left femur (HCC) 03/14/2014   CT scan did however reveal acute fracture in greater left trochanteric. Dr. Margarita Rana consulted. Recommended nonoperative management and weightbearing as tolerated and another 3 weeks of Lovenox for DVT prophylaxis.       . Type 2 diabetes mellitus with blindness, with macular edema, with severe nonproliferative retinopathy 12/19/2007   Qualifier: Diagnosis of  By: Melvyn Neth CMA (AAMA), Patty  01/09/14 Hgb A1c 6.5 01/16/14 Hgb A1c 6.5 03/24/14 Hgb 6.4 12/10/14 pharm: dc Metformin and CBG monitoring-failure to thrive and refusing to eat.     Marland Kitchen Unspecified hereditary and idiopathic peripheral neuropathy 10/28/202012  . Unspecified hypothyroidism 03/18/2010  . Unspecified urinary incontinence 02/22/2005  . Urinary tract infection, site not specified 04/10/2014  . Xerophthalmus 01/21/2015   Past Surgical History:  Procedure Laterality Date  . BREAST BIOPSY Left 1970   benign  . Left hip surgery  09/2005  . PACEMAKER INSERTION  2006   Medtronic In-Pulse  . Right hip surgery  08/2003  . TONSILLECTOMY    . 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 Codeine     unknown  . Ibuprofen     unknown  . Lisinopril     unknown  . Penicillins     unknown  . Sanctura [Trospium Chloride]     unknown    Allergies as of 09/22/2017      Reactions   Codeine    unknown   Ibuprofen    unknown   Lisinopril    unknown   Penicillins    unknown   Sanctura [trospium Chloride]    unknown      Medication List        Accurate as of 09/22/17 11:59 PM. Always use your most recent med list.          atropine 1 % ophthalmic solution every 2 (two) hours as needed. Place 4 drops on  tongue  for excess secretions   bisacodyl 10 MG suppository Commonly known as:  DULCOLAX Place 10 mg rectally daily as needed for moderate constipation.   escitalopram 10 MG tablet Commonly known as:  LEXAPRO Take 10 mg by mouth daily.   feeding supplement Liqd Commonly known as:  BOOST / RESOURCE BREEZE Take 1 Container by mouth 3 (three) times daily between meals.   LORazepam 1 MG tablet Commonly known as:  ATIVAN Take 1 mg by mouth daily. 4 PM   LORazepam 0.5 MG tablet Commonly known as:  ATIVAN Take 0.5 mg by mouth daily. 12 PM   morphine 20 MG/5ML solution Take by mouth every 2 (two) hours as needed for pain. Give 0.62ml, sublingual   sennosides-docusate sodium 8.6-50 MG tablet Commonly known as:  SENOKOT-S Take 2 tablets by mouth at bedtime. Reported on 12/11/2015   TYLENOL 325 MG tablet Generic drug:  acetaminophen Take 650  mg by mouth every 4 (four) hours as needed for mild pain or moderate pain.      ROS was provided with assistance of staff Review of Systems  Constitutional: Negative for activity change, appetite change, chills, diaphoresis, fatigue and fever.  HENT: Positive for hearing loss. Negative for congestion and voice change.   Eyes:       Legally blind.   Respiratory: Negative for cough, choking, shortness of breath and wheezing.   Cardiovascular: Negative for chest pain, palpitations and leg swelling.  Gastrointestinal: Negative for abdominal distention, abdominal pain, constipation, diarrhea, nausea and vomiting.  Genitourinary: Negative for difficulty urinating, dysuria and urgency.       Incontinent of urine.   Musculoskeletal:       Non ambulatory.  Neurological:       Dementia.   Psychiatric/Behavioral: Positive for agitation, behavioral problems and confusion. Negative for hallucinations and sleep disturbance. The patient is not nervous/anxious.     Immunization History  Administered Date(s) Administered  . Influenza Whole 05/10/2017    . Influenza-Unspecified 06/06/2013, 04/21/2015, 05/17/2016  . PPD Test 07/23/2011  . Pneumococcal Conjugate-13 03/14/2009  . Pneumococcal Polysaccharide-23 03/14/2009  . Td 03/14/2009  . Tdap 03/14/2009  . Zoster 06/29/2006, 06/29/2006   Pertinent  Health Maintenance Due  Topic Date Due  . OPHTHALMOLOGY EXAM  07/28/1927  . DEXA SCAN  07/27/1982  . PNA vac Low Risk Adult (2 of 2 - PCV13) 03/14/2010  . HEMOGLOBIN A1C  06/08/2017  . FOOT EXAM  12/05/2017  . URINE MICROALBUMIN  12/06/2017  . INFLUENZA VACCINE  Completed   Fall Risk  03/30/2017 01/26/2015 03/06/2014  Falls in the past year? No Yes Yes  Number falls in past yr: - 1 1  Comment - - 03/06/14  Injury with Fall? - Yes Yes  Comment - - left hip   Risk Factor Category  - - High Fall Risk  Risk for fall due to : - History of fall(s);Impaired balance/gait;Impaired mobility -  Follow up - Falls evaluation completed -   Functional Status Survey:    Vitals:   09/22/17 1601  BP: 130/70  Pulse: 60  Resp: 20  Temp: 98.6 F (37 C)  SpO2: 98%   There is no height or weight on file to calculate BMI. Physical Exam  Constitutional: She appears well-developed and well-nourished.  HENT:  Head: Normocephalic and atraumatic.  Eyes: Conjunctivae and EOM are normal. Pupils are equal, round, and reactive to light.  Legally blind.   Neck: Normal range of motion. Neck supple. No JVD present. No tracheal deviation present.  Cardiovascular: Normal rate and regular rhythm.  Murmur heard. Pulmonary/Chest: She has no wheezes. She has no rales.  Abdominal: Soft. Bowel sounds are normal. She exhibits no distension. There is no tenderness.  Musculoskeletal: She exhibits no edema or tenderness.  Recliner once out of bed,   Neurological: She is alert. She exhibits normal muscle tone. Coordination normal.  Oriented to self.   Skin: Skin is warm and dry.  Psychiatric: She has a normal mood and affect.    Labs reviewed: Recent Labs     12/06/16  NA 142  K 3.9  BUN 24*  CREATININE 0.9   Recent Labs    12/06/16  AST 21  ALT 11  ALKPHOS 70   No results for input(s): WBC, NEUTROABS, HGB, HCT, MCV, PLT in the last 8760 hours. Lab Results  Component Value Date   TSH 3.55 01/27/2015   Lab Results  Component  Value Date   HGBA1C 5.2 12/06/2016   Lab Results  Component Value Date   CHOL 140 12/20/2012   HDL 38 12/20/2012   LDLCALC 57 12/20/2012   TRIG 227 (A) 12/20/2012   CHOLHDL 5.0 12/22/2007    Significant Diagnostic Results in last 30 days:  No results found.  Assessment/Plan  Depression with anxiety Her mood is stable, continue Escitalopram 10mg  daily.   Constipation No constipation, continue Senokot II daily.   Alzheimer's dementia with behavioral disturbance Hx of dementia, total care of ADLs, recliner once out of bed, legally blind, under Hospice service for comfort measures, scheduled Lorazepam and prn Morphine available to her   Family/ staff Communication: plan of care reviewed with the patient and charge nurse.   Labs/tests ordered:  None   Time spend 25 minutes.

## 2017-09-25 DIAGNOSIS — I4891 Unspecified atrial fibrillation: Secondary | ICD-10-CM | POA: Diagnosis not present

## 2017-09-25 DIAGNOSIS — E46 Unspecified protein-calorie malnutrition: Secondary | ICD-10-CM | POA: Diagnosis not present

## 2017-09-25 DIAGNOSIS — F0151 Vascular dementia with behavioral disturbance: Secondary | ICD-10-CM | POA: Diagnosis not present

## 2017-09-25 DIAGNOSIS — I495 Sick sinus syndrome: Secondary | ICD-10-CM | POA: Diagnosis not present

## 2017-09-25 DIAGNOSIS — R634 Abnormal weight loss: Secondary | ICD-10-CM | POA: Diagnosis not present

## 2017-09-25 DIAGNOSIS — R630 Anorexia: Secondary | ICD-10-CM | POA: Diagnosis not present

## 2017-09-25 NOTE — Assessment & Plan Note (Signed)
No constipation, continue Senokot II daily.

## 2017-09-25 NOTE — Assessment & Plan Note (Signed)
Hx of dementia, total care of ADLs, recliner once out of bed, legally blind, under Hospice service for comfort measures, scheduled Lorazepam and prn Morphine available to her

## 2017-09-25 NOTE — Assessment & Plan Note (Signed)
Her mood is stable, continue  Escitalopram 10mg daily.    

## 2017-09-27 DIAGNOSIS — R630 Anorexia: Secondary | ICD-10-CM | POA: Diagnosis not present

## 2017-09-27 DIAGNOSIS — F0151 Vascular dementia with behavioral disturbance: Secondary | ICD-10-CM | POA: Diagnosis not present

## 2017-09-27 DIAGNOSIS — E46 Unspecified protein-calorie malnutrition: Secondary | ICD-10-CM | POA: Diagnosis not present

## 2017-09-27 DIAGNOSIS — I495 Sick sinus syndrome: Secondary | ICD-10-CM | POA: Diagnosis not present

## 2017-09-27 DIAGNOSIS — I4891 Unspecified atrial fibrillation: Secondary | ICD-10-CM | POA: Diagnosis not present

## 2017-09-27 DIAGNOSIS — R634 Abnormal weight loss: Secondary | ICD-10-CM | POA: Diagnosis not present

## 2017-09-29 DIAGNOSIS — I495 Sick sinus syndrome: Secondary | ICD-10-CM | POA: Diagnosis not present

## 2017-09-29 DIAGNOSIS — E785 Hyperlipidemia, unspecified: Secondary | ICD-10-CM | POA: Diagnosis not present

## 2017-09-29 DIAGNOSIS — F0151 Vascular dementia with behavioral disturbance: Secondary | ICD-10-CM | POA: Diagnosis not present

## 2017-09-29 DIAGNOSIS — E46 Unspecified protein-calorie malnutrition: Secondary | ICD-10-CM | POA: Diagnosis not present

## 2017-09-29 DIAGNOSIS — E039 Hypothyroidism, unspecified: Secondary | ICD-10-CM | POA: Diagnosis not present

## 2017-09-29 DIAGNOSIS — R630 Anorexia: Secondary | ICD-10-CM | POA: Diagnosis not present

## 2017-09-29 DIAGNOSIS — F339 Major depressive disorder, recurrent, unspecified: Secondary | ICD-10-CM | POA: Diagnosis not present

## 2017-09-29 DIAGNOSIS — M84359S Stress fracture, hip, unspecified, sequela: Secondary | ICD-10-CM | POA: Diagnosis not present

## 2017-09-29 DIAGNOSIS — I10 Essential (primary) hypertension: Secondary | ICD-10-CM | POA: Diagnosis not present

## 2017-09-29 DIAGNOSIS — I4891 Unspecified atrial fibrillation: Secondary | ICD-10-CM | POA: Diagnosis not present

## 2017-09-29 DIAGNOSIS — E1159 Type 2 diabetes mellitus with other circulatory complications: Secondary | ICD-10-CM | POA: Diagnosis not present

## 2017-09-29 DIAGNOSIS — H3530 Unspecified macular degeneration: Secondary | ICD-10-CM | POA: Diagnosis not present

## 2017-09-29 DIAGNOSIS — R634 Abnormal weight loss: Secondary | ICD-10-CM | POA: Diagnosis not present

## 2017-10-02 DIAGNOSIS — E46 Unspecified protein-calorie malnutrition: Secondary | ICD-10-CM | POA: Diagnosis not present

## 2017-10-02 DIAGNOSIS — R634 Abnormal weight loss: Secondary | ICD-10-CM | POA: Diagnosis not present

## 2017-10-02 DIAGNOSIS — I4891 Unspecified atrial fibrillation: Secondary | ICD-10-CM | POA: Diagnosis not present

## 2017-10-02 DIAGNOSIS — I495 Sick sinus syndrome: Secondary | ICD-10-CM | POA: Diagnosis not present

## 2017-10-02 DIAGNOSIS — R630 Anorexia: Secondary | ICD-10-CM | POA: Diagnosis not present

## 2017-10-02 DIAGNOSIS — F0151 Vascular dementia with behavioral disturbance: Secondary | ICD-10-CM | POA: Diagnosis not present

## 2017-10-03 DIAGNOSIS — E46 Unspecified protein-calorie malnutrition: Secondary | ICD-10-CM | POA: Diagnosis not present

## 2017-10-03 DIAGNOSIS — R634 Abnormal weight loss: Secondary | ICD-10-CM | POA: Diagnosis not present

## 2017-10-03 DIAGNOSIS — I4891 Unspecified atrial fibrillation: Secondary | ICD-10-CM | POA: Diagnosis not present

## 2017-10-03 DIAGNOSIS — R630 Anorexia: Secondary | ICD-10-CM | POA: Diagnosis not present

## 2017-10-03 DIAGNOSIS — I495 Sick sinus syndrome: Secondary | ICD-10-CM | POA: Diagnosis not present

## 2017-10-03 DIAGNOSIS — F0151 Vascular dementia with behavioral disturbance: Secondary | ICD-10-CM | POA: Diagnosis not present

## 2017-10-04 DIAGNOSIS — R630 Anorexia: Secondary | ICD-10-CM | POA: Diagnosis not present

## 2017-10-04 DIAGNOSIS — I495 Sick sinus syndrome: Secondary | ICD-10-CM | POA: Diagnosis not present

## 2017-10-04 DIAGNOSIS — F0151 Vascular dementia with behavioral disturbance: Secondary | ICD-10-CM | POA: Diagnosis not present

## 2017-10-04 DIAGNOSIS — E46 Unspecified protein-calorie malnutrition: Secondary | ICD-10-CM | POA: Diagnosis not present

## 2017-10-04 DIAGNOSIS — R634 Abnormal weight loss: Secondary | ICD-10-CM | POA: Diagnosis not present

## 2017-10-04 DIAGNOSIS — I4891 Unspecified atrial fibrillation: Secondary | ICD-10-CM | POA: Diagnosis not present

## 2017-10-05 DIAGNOSIS — I4891 Unspecified atrial fibrillation: Secondary | ICD-10-CM | POA: Diagnosis not present

## 2017-10-05 DIAGNOSIS — E46 Unspecified protein-calorie malnutrition: Secondary | ICD-10-CM | POA: Diagnosis not present

## 2017-10-05 DIAGNOSIS — F0151 Vascular dementia with behavioral disturbance: Secondary | ICD-10-CM | POA: Diagnosis not present

## 2017-10-05 DIAGNOSIS — R630 Anorexia: Secondary | ICD-10-CM | POA: Diagnosis not present

## 2017-10-05 DIAGNOSIS — I495 Sick sinus syndrome: Secondary | ICD-10-CM | POA: Diagnosis not present

## 2017-10-05 DIAGNOSIS — R634 Abnormal weight loss: Secondary | ICD-10-CM | POA: Diagnosis not present

## 2017-10-06 DIAGNOSIS — E46 Unspecified protein-calorie malnutrition: Secondary | ICD-10-CM | POA: Diagnosis not present

## 2017-10-06 DIAGNOSIS — I4891 Unspecified atrial fibrillation: Secondary | ICD-10-CM | POA: Diagnosis not present

## 2017-10-06 DIAGNOSIS — F0151 Vascular dementia with behavioral disturbance: Secondary | ICD-10-CM | POA: Diagnosis not present

## 2017-10-06 DIAGNOSIS — I495 Sick sinus syndrome: Secondary | ICD-10-CM | POA: Diagnosis not present

## 2017-10-06 DIAGNOSIS — R630 Anorexia: Secondary | ICD-10-CM | POA: Diagnosis not present

## 2017-10-06 DIAGNOSIS — R634 Abnormal weight loss: Secondary | ICD-10-CM | POA: Diagnosis not present

## 2017-10-09 DIAGNOSIS — I4891 Unspecified atrial fibrillation: Secondary | ICD-10-CM | POA: Diagnosis not present

## 2017-10-09 DIAGNOSIS — I495 Sick sinus syndrome: Secondary | ICD-10-CM | POA: Diagnosis not present

## 2017-10-09 DIAGNOSIS — R630 Anorexia: Secondary | ICD-10-CM | POA: Diagnosis not present

## 2017-10-09 DIAGNOSIS — R634 Abnormal weight loss: Secondary | ICD-10-CM | POA: Diagnosis not present

## 2017-10-09 DIAGNOSIS — F0151 Vascular dementia with behavioral disturbance: Secondary | ICD-10-CM | POA: Diagnosis not present

## 2017-10-09 DIAGNOSIS — E46 Unspecified protein-calorie malnutrition: Secondary | ICD-10-CM | POA: Diagnosis not present

## 2017-10-10 DIAGNOSIS — I495 Sick sinus syndrome: Secondary | ICD-10-CM | POA: Diagnosis not present

## 2017-10-10 DIAGNOSIS — R630 Anorexia: Secondary | ICD-10-CM | POA: Diagnosis not present

## 2017-10-10 DIAGNOSIS — F0151 Vascular dementia with behavioral disturbance: Secondary | ICD-10-CM | POA: Diagnosis not present

## 2017-10-10 DIAGNOSIS — R634 Abnormal weight loss: Secondary | ICD-10-CM | POA: Diagnosis not present

## 2017-10-10 DIAGNOSIS — E46 Unspecified protein-calorie malnutrition: Secondary | ICD-10-CM | POA: Diagnosis not present

## 2017-10-10 DIAGNOSIS — I4891 Unspecified atrial fibrillation: Secondary | ICD-10-CM | POA: Diagnosis not present

## 2017-10-11 DIAGNOSIS — R630 Anorexia: Secondary | ICD-10-CM | POA: Diagnosis not present

## 2017-10-11 DIAGNOSIS — E46 Unspecified protein-calorie malnutrition: Secondary | ICD-10-CM | POA: Diagnosis not present

## 2017-10-11 DIAGNOSIS — I4891 Unspecified atrial fibrillation: Secondary | ICD-10-CM | POA: Diagnosis not present

## 2017-10-11 DIAGNOSIS — R634 Abnormal weight loss: Secondary | ICD-10-CM | POA: Diagnosis not present

## 2017-10-11 DIAGNOSIS — F0151 Vascular dementia with behavioral disturbance: Secondary | ICD-10-CM | POA: Diagnosis not present

## 2017-10-11 DIAGNOSIS — I495 Sick sinus syndrome: Secondary | ICD-10-CM | POA: Diagnosis not present

## 2017-10-12 DIAGNOSIS — R630 Anorexia: Secondary | ICD-10-CM | POA: Diagnosis not present

## 2017-10-12 DIAGNOSIS — E46 Unspecified protein-calorie malnutrition: Secondary | ICD-10-CM | POA: Diagnosis not present

## 2017-10-12 DIAGNOSIS — I495 Sick sinus syndrome: Secondary | ICD-10-CM | POA: Diagnosis not present

## 2017-10-12 DIAGNOSIS — F0151 Vascular dementia with behavioral disturbance: Secondary | ICD-10-CM | POA: Diagnosis not present

## 2017-10-12 DIAGNOSIS — I4891 Unspecified atrial fibrillation: Secondary | ICD-10-CM | POA: Diagnosis not present

## 2017-10-12 DIAGNOSIS — R634 Abnormal weight loss: Secondary | ICD-10-CM | POA: Diagnosis not present

## 2017-10-13 DIAGNOSIS — R634 Abnormal weight loss: Secondary | ICD-10-CM | POA: Diagnosis not present

## 2017-10-13 DIAGNOSIS — I495 Sick sinus syndrome: Secondary | ICD-10-CM | POA: Diagnosis not present

## 2017-10-13 DIAGNOSIS — F0151 Vascular dementia with behavioral disturbance: Secondary | ICD-10-CM | POA: Diagnosis not present

## 2017-10-13 DIAGNOSIS — I4891 Unspecified atrial fibrillation: Secondary | ICD-10-CM | POA: Diagnosis not present

## 2017-10-13 DIAGNOSIS — E46 Unspecified protein-calorie malnutrition: Secondary | ICD-10-CM | POA: Diagnosis not present

## 2017-10-13 DIAGNOSIS — R630 Anorexia: Secondary | ICD-10-CM | POA: Diagnosis not present

## 2017-10-16 DIAGNOSIS — F0151 Vascular dementia with behavioral disturbance: Secondary | ICD-10-CM | POA: Diagnosis not present

## 2017-10-16 DIAGNOSIS — R630 Anorexia: Secondary | ICD-10-CM | POA: Diagnosis not present

## 2017-10-16 DIAGNOSIS — E46 Unspecified protein-calorie malnutrition: Secondary | ICD-10-CM | POA: Diagnosis not present

## 2017-10-16 DIAGNOSIS — R634 Abnormal weight loss: Secondary | ICD-10-CM | POA: Diagnosis not present

## 2017-10-16 DIAGNOSIS — I495 Sick sinus syndrome: Secondary | ICD-10-CM | POA: Diagnosis not present

## 2017-10-16 DIAGNOSIS — I4891 Unspecified atrial fibrillation: Secondary | ICD-10-CM | POA: Diagnosis not present

## 2017-10-17 DIAGNOSIS — F0151 Vascular dementia with behavioral disturbance: Secondary | ICD-10-CM | POA: Diagnosis not present

## 2017-10-17 DIAGNOSIS — R634 Abnormal weight loss: Secondary | ICD-10-CM | POA: Diagnosis not present

## 2017-10-17 DIAGNOSIS — I4891 Unspecified atrial fibrillation: Secondary | ICD-10-CM | POA: Diagnosis not present

## 2017-10-17 DIAGNOSIS — E46 Unspecified protein-calorie malnutrition: Secondary | ICD-10-CM | POA: Diagnosis not present

## 2017-10-17 DIAGNOSIS — R630 Anorexia: Secondary | ICD-10-CM | POA: Diagnosis not present

## 2017-10-17 DIAGNOSIS — I495 Sick sinus syndrome: Secondary | ICD-10-CM | POA: Diagnosis not present

## 2017-10-18 DIAGNOSIS — R630 Anorexia: Secondary | ICD-10-CM | POA: Diagnosis not present

## 2017-10-18 DIAGNOSIS — R634 Abnormal weight loss: Secondary | ICD-10-CM | POA: Diagnosis not present

## 2017-10-18 DIAGNOSIS — I4891 Unspecified atrial fibrillation: Secondary | ICD-10-CM | POA: Diagnosis not present

## 2017-10-18 DIAGNOSIS — I495 Sick sinus syndrome: Secondary | ICD-10-CM | POA: Diagnosis not present

## 2017-10-18 DIAGNOSIS — F0151 Vascular dementia with behavioral disturbance: Secondary | ICD-10-CM | POA: Diagnosis not present

## 2017-10-18 DIAGNOSIS — E46 Unspecified protein-calorie malnutrition: Secondary | ICD-10-CM | POA: Diagnosis not present

## 2017-10-20 ENCOUNTER — Non-Acute Institutional Stay (SKILLED_NURSING_FACILITY): Payer: Medicare Other | Admitting: Nurse Practitioner

## 2017-10-20 ENCOUNTER — Encounter: Payer: Self-pay | Admitting: Nurse Practitioner

## 2017-10-20 DIAGNOSIS — R630 Anorexia: Secondary | ICD-10-CM | POA: Diagnosis not present

## 2017-10-20 DIAGNOSIS — G301 Alzheimer's disease with late onset: Secondary | ICD-10-CM | POA: Diagnosis not present

## 2017-10-20 DIAGNOSIS — I495 Sick sinus syndrome: Secondary | ICD-10-CM | POA: Diagnosis not present

## 2017-10-20 DIAGNOSIS — F0281 Dementia in other diseases classified elsewhere with behavioral disturbance: Secondary | ICD-10-CM | POA: Diagnosis not present

## 2017-10-20 DIAGNOSIS — I4891 Unspecified atrial fibrillation: Secondary | ICD-10-CM | POA: Diagnosis not present

## 2017-10-20 DIAGNOSIS — E46 Unspecified protein-calorie malnutrition: Secondary | ICD-10-CM | POA: Diagnosis not present

## 2017-10-20 DIAGNOSIS — R634 Abnormal weight loss: Secondary | ICD-10-CM | POA: Diagnosis not present

## 2017-10-20 DIAGNOSIS — F418 Other specified anxiety disorders: Secondary | ICD-10-CM | POA: Diagnosis not present

## 2017-10-20 DIAGNOSIS — R627 Adult failure to thrive: Secondary | ICD-10-CM

## 2017-10-20 DIAGNOSIS — F0151 Vascular dementia with behavioral disturbance: Secondary | ICD-10-CM | POA: Diagnosis not present

## 2017-10-20 NOTE — Assessment & Plan Note (Signed)
dementia, total care of ADL except holding a cup occasionally, one person transfer, incontinent of B+B, non ambulatory. She is under Hospice service for comfort measures, prn Morphine 5mg  q2h prn, Lorazepam 1mg  q4pm, 0.5mg  qd po, atropine 1% q2h prn SL.

## 2017-10-20 NOTE — Progress Notes (Signed)
Location:  [facility] Room Number: 30 Place of Service:  SNF (31) Provider:  Karisha Marlin, Manxie  NP  Oneal Grout, MD  Patient Care Team: Oneal Grout, MD as PCP - General (Internal Medicine) Janay Canan X, NP as Nurse Practitioner (Nurse Practitioner) [place] [place], [place] Lowella Bandy., MD as Consulting Physician (Urology) Rachael Fee, MD as Consulting Physician (Gastroenterology) Salvatore Marvel, MD as Consulting Physician (Orthopedic Surgery) Venancio Poisson, MD as Consulting Physician (Dermatology) Sheral Apley, MD as Attending Physician (Orthopedic Surgery)  Extended Emergency Contact Information Primary Emergency Contact: Cook,[name] Address: [address]          [place], [place] [address] [place] of [place] Home Phone: [phone] Work Phone: [phone] Mobile Phone: [phone] Relation: Son Secondary Emergency Contact: Castagnola,[name] Address: [address]          [place], [place] [address] [place] of [place] Home Phone: [phone] Relation: Relative  Code Status:  DNR Goals of care: Advanced Directive information Advanced Directives 10/20/2017  Does Patient Have a Medical Advance Directive? Yes  Type of Advance Directive Living will;Out of facility DNR (pink MOST or yellow form)  Does patient want to make changes to medical advance directive? No - Patient declined  Copy of Healthcare Power of Attorney in Chart? -  Pre-existing out of facility DNR order (yellow form or pink MOST form) -     Chief Complaint  Patient presents with  . Medical Management of Chronic Issues    F/U- depression with anxiety    HPI:  Pt is a 84 y.o. female seen today for medical management of chronic diseases.     The patient has history of dementia, total care of ADL except holding a cup occasionally, one person transfer, incontinent of B+B, non ambulatory. She is under Hospice service for comfort measures, prn Morphine 5mg  q2h prn,  Lorazepam 1mg  q4pm, 0.5mg  qd po, atropine 1% q2h prn SL.  Her mood is stable on Escitalopram 10mg  qd   Past Medical History:  Diagnosis Date  . Abnormality of gait 09/08/2003  . AF (atrial fibrillation) (HCC)   . Anxiety state, unspecified 08/19/2010  . Aortic stenosis   . Atrial fibrillation (HCC) 07/14/2009   Qualifier: Diagnosis of  By: [name], MD, Ruthann Cancer Ty Hilts   . Bradycardia   . Cardiac pacemaker in situ 09/12/2004  . Closed fracture of lumbar vertebra without mention of spinal cord injury 12020/07/1009  . Closed fracture of unspecified trochanteric section of femur 05/29/2011  . Constipation 03/14/2014  . Dementia 2013   04/03/14 MMSE 22/30   . Depression 04/10/2014  . Diffuse cystic mastopathy 12/23/2009  . Disturbance of salivary secretion [id number]  . Edema 08/18/2003  . Fall 12/19/2012   07/22/14 fall VS 144/76, 60, 20, 97.1. No apparent injury   . Flatulence, eructation, and gas pain 12/03/2009  . Fracture of greater trochanter of left femur (HCC) 03/08/14  . FTT (failure to thrive) in adult 12/15/2014  . Hearing loss   . Hemorrhoids   . Hip fracture (HCC)   . HTN (hypertension)   . Hypothyroidism 12/19/2007   Qualifier: Diagnosis of  By: Melvyn Neth CMA (AAMA), Patty  01/16/14 TSH 2.474 03/24/14 TSH 2.576    . 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 Loss of weight 10/13/2014   Multiple factorials: dementia, depression, advanced age, possible AR of medications(metformin and Oxybutynin)-will increase Mirtazapine and continue supplement. Observe.    . Macular degeneration (  senile) of retina, unspecified 12/12/2001  . Macular degeneration of both eyes 01/24/2013   Severe visual impairment. Nearly blind.   . Mitral valve prolapse   . Occlusion and stenosis of carotid artery without mention of cerebral infarction 12/27/2007  . Other and unspecified hyperlipidemia   . Other malaise and fatigue 12/06/2007  . Pacemaker-dual  medtronic 09/07/2011  .  Personal history of fall 01/29/2009  . Rectal bleeding   . Senile osteoporosis 12/13/2000  . Sinus node dysfunction (HCC)   . Syncope and collapse 12/27/2007  . Trochanteric fracture of left femur (HCC) 03/14/2014   CT scan did however reveal acute fracture in greater left trochanteric. Dr. Margarita Rana consulted. Recommended nonoperative management and weightbearing as tolerated and another 3 weeks of Lovenox for DVT prophylaxis.       . Type 2 diabetes mellitus with blindness, with macular edema, with severe nonproliferative retinopathy 12/19/2007   Qualifier: Diagnosis of  By: Melvyn Neth CMA (AAMA), Patty  01/09/14 Hgb A1c 6.5 01/16/14 Hgb A1c 6.5 03/24/14 Hgb 6.4 12/10/14 pharm: dc Metformin and CBG monitoring-failure to thrive and refusing to eat.     Marland Kitchen Unspecified hereditary and idiopathic peripheral neuropathy 10/28/202012  . Unspecified hypothyroidism 03/18/2010  . Unspecified urinary incontinence 02/22/2005  . Urinary tract infection, site not specified 04/10/2014  . Xerophthalmus 01/21/2015   Past Surgical History:  Procedure Laterality Date  . BREAST BIOPSY Left 1970   benign  . Left hip surgery  09/2005  . PACEMAKER INSERTION  2006   Medtronic In-Pulse  . Right hip surgery  08/2003  . TONSILLECTOMY    . 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 Codeine     unknown  . Ibuprofen     unknown  . Lisinopril     unknown  . Penicillins     unknown  . Sanctura [Trospium Chloride]     unknown    Outpatient Encounter Medications as of 10/20/2017  Medication Sig  . acetaminophen (TYLENOL) 325 MG tablet Take 650 mg by mouth every 4 (four) hours as needed for mild pain or moderate pain.   Marland Kitchen atropine 1 % ophthalmic solution every 2 (two) hours as needed. Place 4 drops on tongue  for excess secretions  . bisacodyl (DULCOLAX) 10 MG suppository Place 10 mg rectally daily as needed for moderate  constipation.   Marland Kitchen escitalopram (LEXAPRO) 10 MG tablet Take 10 mg by mouth daily.   . feeding supplement (BOOST / RESOURCE BREEZE) LIQD Take 1 Container by mouth 3 (three) times daily between meals.  Marland Kitchen LORazepam (ATIVAN) 0.5 MG tablet Take 0.5 mg by mouth daily. 12 PM  . LORazepam (ATIVAN) 1 MG tablet Take 1 mg by mouth daily. 4 PM  . morphine 20 MG/5ML solution Take by mouth every 2 (two) hours as needed for pain. Give 0.85ml, sublingual  . sennosides-docusate sodium (SENOKOT-S) 8.6-50 MG tablet Take 2 tablets by mouth at bedtime. Reported on 12/11/2015   No facility-administered encounter medications on file as of 10/20/2017.    ROS was provided with assistance of staff Review of Systems  Constitutional: Negative for activity change, appetite change, chills, diaphoresis, fatigue and fever.  HENT: Positive for hearing loss. Negative for congestion.   Eyes:       Blind  Respiratory: Negative for cough, chest tightness, shortness of breath and wheezing.   Cardiovascular: Negative for chest pain, palpitations and leg swelling.  Gastrointestinal: Negative for abdominal distention, abdominal pain, constipation, diarrhea, nausea and vomiting.  Genitourinary: Negative for difficulty urinating, dysuria and urgency.       Incontinent of urine  Musculoskeletal: Positive for gait problem. Negative for joint swelling.       Non ambulatory  Skin: Positive for wound.       Skin tears right forearm are well approximated with steri strips, healing nicely.   Neurological: Negative for speech difficulty, weakness and headaches.       Dementia  Psychiatric/Behavioral: Positive for agitation, behavioral problems and confusion. Negative for sleep disturbance. The patient is not nervous/anxious.     Immunization History  Administered Date(s) Administered  . Influenza Whole 05/10/2017  . Influenza-Unspecified 06/06/2013, 04/21/2015, 05/17/2016  . PPD Test 07/23/2011  . Pneumococcal Conjugate-13 03/14/2009    . Pneumococcal Polysaccharide-23 03/14/2009  . Td 03/14/2009  . Tdap 03/14/2009  . Zoster 06/29/2006, 06/29/2006   Pertinent  Health Maintenance Due  Topic Date Due  . OPHTHALMOLOGY EXAM  07/28/1927  . DEXA SCAN  07/27/1982  . PNA vac Low Risk Adult (2 of 2 - PCV13) 03/14/2010  . HEMOGLOBIN A1C  06/08/2017  . FOOT EXAM  12/05/2017  . URINE MICROALBUMIN  12/06/2017  . INFLUENZA VACCINE  Completed   Fall Risk  03/30/2017 01/26/2015 03/06/2014  Falls in the past year? No Yes Yes  Number falls in past yr: - 1 1  Comment - - 03/06/14  Injury with Fall? - Yes Yes  Comment - - left hip   Risk Factor Category  - - High Fall Risk  Risk for fall due to : - History of fall(s);Impaired balance/gait;Impaired mobility -  Follow up - Falls evaluation completed -   Functional Status Survey:    Vitals:   10/20/17 1221  BP: 122/62  Pulse: 60  Resp: 18  Temp: (!) 97.2 F (36.2 C)  SpO2: 98%  Weight: 85 lb 12.8 oz (38.9 kg)  Height: 5\' 2"  (1.575 m)   Body mass index is 15.69 kg/m. Physical Exam  Constitutional: She appears well-developed and well-nourished.  HENT:  Head: Normocephalic and atraumatic.  Eyes: Pupils are equal, round, and reactive to light. Conjunctivae and EOM are normal.  Legally blind  Neck: Normal range of motion. Neck supple. No JVD present. No thyromegaly present.  Cardiovascular:  Murmur heard. Pulmonary/Chest: Effort normal and breath sounds normal. She has no wheezes. She has no rales.  Abdominal: Soft. Bowel sounds are normal. She exhibits no distension. There is no tenderness.  Musculoskeletal: She exhibits no edema or tenderness.  One person transfer, non ambulatory  Neurological: She is alert. She exhibits normal muscle tone. Coordination normal.  Oriented to self  Skin: Skin is warm and dry.  The right forearm skin tears are well approximated and healing nicely.   Psychiatric: She has a normal mood and affect.    Labs reviewed: Recent Labs     12/06/16  NA 142  K 3.9  BUN 24*  CREATININE 0.9   Recent Labs    12/06/16  AST 21  ALT 11  ALKPHOS 70   No results for input(s): WBC, NEUTROABS, HGB, HCT, MCV, PLT in the last 8760 hours. Lab Results  Component Value Date   TSH 3.55 01/27/2015   Lab Results  Component Value Date   HGBA1C 5.2 12/06/2016   Lab Results  Component Value Date   CHOL 140 12/20/2012   HDL 38 12/20/2012   LDLCALC 57 12/20/2012   TRIG 227 (A)  12/20/2012   CHOLHDL 5.0 12/22/2007    Significant Diagnostic Results in last 30 days:  No results found.  Assessment/Plan Alzheimer's dementia with behavioral disturbance dementia, total care of ADL except holding a cup occasionally, one person transfer, incontinent of B+B, non ambulatory. She is under Hospice service for comfort measures, prn Morphine 5mg  q2h prn, Lorazepam 1mg  q4pm, 0.5mg  qd po, atropine 1% q2h prn SL.    Depression with anxiety  Her mood is managed, continue Escitalopram 10mg  qd  FTT (failure to thrive) in adult Ongoing issue, supportive care.      Family/ staff Communication: plan of care reviewed with the patient and charge   Labs/tests ordered:  none  Time spend 25 minutes

## 2017-10-20 NOTE — Assessment & Plan Note (Signed)
Her mood is managed, continue Escitalopram 10mg  qd

## 2017-10-20 NOTE — Assessment & Plan Note (Signed)
Ongoing issue, supportive care.

## 2017-10-23 DIAGNOSIS — I495 Sick sinus syndrome: Secondary | ICD-10-CM | POA: Diagnosis not present

## 2017-10-23 DIAGNOSIS — F0151 Vascular dementia with behavioral disturbance: Secondary | ICD-10-CM | POA: Diagnosis not present

## 2017-10-23 DIAGNOSIS — R634 Abnormal weight loss: Secondary | ICD-10-CM | POA: Diagnosis not present

## 2017-10-23 DIAGNOSIS — I4891 Unspecified atrial fibrillation: Secondary | ICD-10-CM | POA: Diagnosis not present

## 2017-10-23 DIAGNOSIS — E46 Unspecified protein-calorie malnutrition: Secondary | ICD-10-CM | POA: Diagnosis not present

## 2017-10-23 DIAGNOSIS — R630 Anorexia: Secondary | ICD-10-CM | POA: Diagnosis not present

## 2017-10-24 DIAGNOSIS — E46 Unspecified protein-calorie malnutrition: Secondary | ICD-10-CM | POA: Diagnosis not present

## 2017-10-24 DIAGNOSIS — I4891 Unspecified atrial fibrillation: Secondary | ICD-10-CM | POA: Diagnosis not present

## 2017-10-24 DIAGNOSIS — R630 Anorexia: Secondary | ICD-10-CM | POA: Diagnosis not present

## 2017-10-24 DIAGNOSIS — R634 Abnormal weight loss: Secondary | ICD-10-CM | POA: Diagnosis not present

## 2017-10-24 DIAGNOSIS — F0151 Vascular dementia with behavioral disturbance: Secondary | ICD-10-CM | POA: Diagnosis not present

## 2017-10-24 DIAGNOSIS — I495 Sick sinus syndrome: Secondary | ICD-10-CM | POA: Diagnosis not present

## 2017-10-25 DIAGNOSIS — F0151 Vascular dementia with behavioral disturbance: Secondary | ICD-10-CM | POA: Diagnosis not present

## 2017-10-25 DIAGNOSIS — I495 Sick sinus syndrome: Secondary | ICD-10-CM | POA: Diagnosis not present

## 2017-10-25 DIAGNOSIS — I4891 Unspecified atrial fibrillation: Secondary | ICD-10-CM | POA: Diagnosis not present

## 2017-10-25 DIAGNOSIS — E46 Unspecified protein-calorie malnutrition: Secondary | ICD-10-CM | POA: Diagnosis not present

## 2017-10-25 DIAGNOSIS — R630 Anorexia: Secondary | ICD-10-CM | POA: Diagnosis not present

## 2017-10-25 DIAGNOSIS — R634 Abnormal weight loss: Secondary | ICD-10-CM | POA: Diagnosis not present

## 2017-10-27 DIAGNOSIS — R630 Anorexia: Secondary | ICD-10-CM | POA: Diagnosis not present

## 2017-10-27 DIAGNOSIS — F0151 Vascular dementia with behavioral disturbance: Secondary | ICD-10-CM | POA: Diagnosis not present

## 2017-10-27 DIAGNOSIS — I4891 Unspecified atrial fibrillation: Secondary | ICD-10-CM | POA: Diagnosis not present

## 2017-10-27 DIAGNOSIS — I495 Sick sinus syndrome: Secondary | ICD-10-CM | POA: Diagnosis not present

## 2017-10-27 DIAGNOSIS — R634 Abnormal weight loss: Secondary | ICD-10-CM | POA: Diagnosis not present

## 2017-10-27 DIAGNOSIS — E46 Unspecified protein-calorie malnutrition: Secondary | ICD-10-CM | POA: Diagnosis not present

## 2017-10-30 DIAGNOSIS — E1159 Type 2 diabetes mellitus with other circulatory complications: Secondary | ICD-10-CM | POA: Diagnosis not present

## 2017-10-30 DIAGNOSIS — I10 Essential (primary) hypertension: Secondary | ICD-10-CM | POA: Diagnosis not present

## 2017-10-30 DIAGNOSIS — I4891 Unspecified atrial fibrillation: Secondary | ICD-10-CM | POA: Diagnosis not present

## 2017-10-30 DIAGNOSIS — H3530 Unspecified macular degeneration: Secondary | ICD-10-CM | POA: Diagnosis not present

## 2017-10-30 DIAGNOSIS — E785 Hyperlipidemia, unspecified: Secondary | ICD-10-CM | POA: Diagnosis not present

## 2017-10-30 DIAGNOSIS — F339 Major depressive disorder, recurrent, unspecified: Secondary | ICD-10-CM | POA: Diagnosis not present

## 2017-10-30 DIAGNOSIS — E46 Unspecified protein-calorie malnutrition: Secondary | ICD-10-CM | POA: Diagnosis not present

## 2017-10-30 DIAGNOSIS — R630 Anorexia: Secondary | ICD-10-CM | POA: Diagnosis not present

## 2017-10-30 DIAGNOSIS — R634 Abnormal weight loss: Secondary | ICD-10-CM | POA: Diagnosis not present

## 2017-10-30 DIAGNOSIS — E039 Hypothyroidism, unspecified: Secondary | ICD-10-CM | POA: Diagnosis not present

## 2017-10-30 DIAGNOSIS — F0151 Vascular dementia with behavioral disturbance: Secondary | ICD-10-CM | POA: Diagnosis not present

## 2017-10-30 DIAGNOSIS — M84359S Stress fracture, hip, unspecified, sequela: Secondary | ICD-10-CM | POA: Diagnosis not present

## 2017-10-30 DIAGNOSIS — I495 Sick sinus syndrome: Secondary | ICD-10-CM | POA: Diagnosis not present

## 2017-10-31 DIAGNOSIS — R630 Anorexia: Secondary | ICD-10-CM | POA: Diagnosis not present

## 2017-10-31 DIAGNOSIS — I4891 Unspecified atrial fibrillation: Secondary | ICD-10-CM | POA: Diagnosis not present

## 2017-10-31 DIAGNOSIS — F0151 Vascular dementia with behavioral disturbance: Secondary | ICD-10-CM | POA: Diagnosis not present

## 2017-10-31 DIAGNOSIS — R634 Abnormal weight loss: Secondary | ICD-10-CM | POA: Diagnosis not present

## 2017-10-31 DIAGNOSIS — I495 Sick sinus syndrome: Secondary | ICD-10-CM | POA: Diagnosis not present

## 2017-10-31 DIAGNOSIS — E46 Unspecified protein-calorie malnutrition: Secondary | ICD-10-CM | POA: Diagnosis not present

## 2017-11-01 DIAGNOSIS — R634 Abnormal weight loss: Secondary | ICD-10-CM | POA: Diagnosis not present

## 2017-11-01 DIAGNOSIS — E46 Unspecified protein-calorie malnutrition: Secondary | ICD-10-CM | POA: Diagnosis not present

## 2017-11-01 DIAGNOSIS — F0151 Vascular dementia with behavioral disturbance: Secondary | ICD-10-CM | POA: Diagnosis not present

## 2017-11-01 DIAGNOSIS — I495 Sick sinus syndrome: Secondary | ICD-10-CM | POA: Diagnosis not present

## 2017-11-01 DIAGNOSIS — R630 Anorexia: Secondary | ICD-10-CM | POA: Diagnosis not present

## 2017-11-01 DIAGNOSIS — I4891 Unspecified atrial fibrillation: Secondary | ICD-10-CM | POA: Diagnosis not present

## 2017-11-03 DIAGNOSIS — E46 Unspecified protein-calorie malnutrition: Secondary | ICD-10-CM | POA: Diagnosis not present

## 2017-11-03 DIAGNOSIS — R634 Abnormal weight loss: Secondary | ICD-10-CM | POA: Diagnosis not present

## 2017-11-03 DIAGNOSIS — I495 Sick sinus syndrome: Secondary | ICD-10-CM | POA: Diagnosis not present

## 2017-11-03 DIAGNOSIS — I4891 Unspecified atrial fibrillation: Secondary | ICD-10-CM | POA: Diagnosis not present

## 2017-11-03 DIAGNOSIS — R630 Anorexia: Secondary | ICD-10-CM | POA: Diagnosis not present

## 2017-11-03 DIAGNOSIS — F0151 Vascular dementia with behavioral disturbance: Secondary | ICD-10-CM | POA: Diagnosis not present

## 2017-11-06 DIAGNOSIS — R634 Abnormal weight loss: Secondary | ICD-10-CM | POA: Diagnosis not present

## 2017-11-06 DIAGNOSIS — F0151 Vascular dementia with behavioral disturbance: Secondary | ICD-10-CM | POA: Diagnosis not present

## 2017-11-06 DIAGNOSIS — R630 Anorexia: Secondary | ICD-10-CM | POA: Diagnosis not present

## 2017-11-06 DIAGNOSIS — E46 Unspecified protein-calorie malnutrition: Secondary | ICD-10-CM | POA: Diagnosis not present

## 2017-11-06 DIAGNOSIS — I4891 Unspecified atrial fibrillation: Secondary | ICD-10-CM | POA: Diagnosis not present

## 2017-11-06 DIAGNOSIS — I495 Sick sinus syndrome: Secondary | ICD-10-CM | POA: Diagnosis not present

## 2017-11-08 DIAGNOSIS — R630 Anorexia: Secondary | ICD-10-CM | POA: Diagnosis not present

## 2017-11-08 DIAGNOSIS — R634 Abnormal weight loss: Secondary | ICD-10-CM | POA: Diagnosis not present

## 2017-11-08 DIAGNOSIS — E46 Unspecified protein-calorie malnutrition: Secondary | ICD-10-CM | POA: Diagnosis not present

## 2017-11-08 DIAGNOSIS — I495 Sick sinus syndrome: Secondary | ICD-10-CM | POA: Diagnosis not present

## 2017-11-08 DIAGNOSIS — I4891 Unspecified atrial fibrillation: Secondary | ICD-10-CM | POA: Diagnosis not present

## 2017-11-08 DIAGNOSIS — F0151 Vascular dementia with behavioral disturbance: Secondary | ICD-10-CM | POA: Diagnosis not present

## 2017-11-10 DIAGNOSIS — F0151 Vascular dementia with behavioral disturbance: Secondary | ICD-10-CM | POA: Diagnosis not present

## 2017-11-10 DIAGNOSIS — I4891 Unspecified atrial fibrillation: Secondary | ICD-10-CM | POA: Diagnosis not present

## 2017-11-10 DIAGNOSIS — R630 Anorexia: Secondary | ICD-10-CM | POA: Diagnosis not present

## 2017-11-10 DIAGNOSIS — I495 Sick sinus syndrome: Secondary | ICD-10-CM | POA: Diagnosis not present

## 2017-11-10 DIAGNOSIS — E46 Unspecified protein-calorie malnutrition: Secondary | ICD-10-CM | POA: Diagnosis not present

## 2017-11-10 DIAGNOSIS — R634 Abnormal weight loss: Secondary | ICD-10-CM | POA: Diagnosis not present

## 2017-11-13 DIAGNOSIS — F0151 Vascular dementia with behavioral disturbance: Secondary | ICD-10-CM | POA: Diagnosis not present

## 2017-11-13 DIAGNOSIS — E46 Unspecified protein-calorie malnutrition: Secondary | ICD-10-CM | POA: Diagnosis not present

## 2017-11-13 DIAGNOSIS — I4891 Unspecified atrial fibrillation: Secondary | ICD-10-CM | POA: Diagnosis not present

## 2017-11-13 DIAGNOSIS — R630 Anorexia: Secondary | ICD-10-CM | POA: Diagnosis not present

## 2017-11-13 DIAGNOSIS — I495 Sick sinus syndrome: Secondary | ICD-10-CM | POA: Diagnosis not present

## 2017-11-13 DIAGNOSIS — R634 Abnormal weight loss: Secondary | ICD-10-CM | POA: Diagnosis not present

## 2017-11-15 DIAGNOSIS — R630 Anorexia: Secondary | ICD-10-CM | POA: Diagnosis not present

## 2017-11-15 DIAGNOSIS — I495 Sick sinus syndrome: Secondary | ICD-10-CM | POA: Diagnosis not present

## 2017-11-15 DIAGNOSIS — F0151 Vascular dementia with behavioral disturbance: Secondary | ICD-10-CM | POA: Diagnosis not present

## 2017-11-15 DIAGNOSIS — I4891 Unspecified atrial fibrillation: Secondary | ICD-10-CM | POA: Diagnosis not present

## 2017-11-15 DIAGNOSIS — R634 Abnormal weight loss: Secondary | ICD-10-CM | POA: Diagnosis not present

## 2017-11-15 DIAGNOSIS — E46 Unspecified protein-calorie malnutrition: Secondary | ICD-10-CM | POA: Diagnosis not present

## 2017-11-16 ENCOUNTER — Non-Acute Institutional Stay (SKILLED_NURSING_FACILITY): Payer: Medicare Other | Admitting: Internal Medicine

## 2017-11-16 ENCOUNTER — Encounter: Payer: Self-pay | Admitting: Internal Medicine

## 2017-11-16 DIAGNOSIS — R634 Abnormal weight loss: Secondary | ICD-10-CM | POA: Diagnosis not present

## 2017-11-16 DIAGNOSIS — F0151 Vascular dementia with behavioral disturbance: Secondary | ICD-10-CM | POA: Diagnosis not present

## 2017-11-16 DIAGNOSIS — K5901 Slow transit constipation: Secondary | ICD-10-CM

## 2017-11-16 DIAGNOSIS — I4891 Unspecified atrial fibrillation: Secondary | ICD-10-CM | POA: Diagnosis not present

## 2017-11-16 DIAGNOSIS — F02818 Dementia in other diseases classified elsewhere, unspecified severity, with other behavioral disturbance: Secondary | ICD-10-CM

## 2017-11-16 DIAGNOSIS — E113299 Type 2 diabetes mellitus with mild nonproliferative diabetic retinopathy without macular edema, unspecified eye: Secondary | ICD-10-CM | POA: Diagnosis not present

## 2017-11-16 DIAGNOSIS — I480 Paroxysmal atrial fibrillation: Secondary | ICD-10-CM | POA: Diagnosis not present

## 2017-11-16 DIAGNOSIS — F418 Other specified anxiety disorders: Secondary | ICD-10-CM | POA: Diagnosis not present

## 2017-11-16 DIAGNOSIS — E43 Unspecified severe protein-calorie malnutrition: Secondary | ICD-10-CM

## 2017-11-16 DIAGNOSIS — F0281 Dementia in other diseases classified elsewhere with behavioral disturbance: Secondary | ICD-10-CM | POA: Diagnosis not present

## 2017-11-16 DIAGNOSIS — E46 Unspecified protein-calorie malnutrition: Secondary | ICD-10-CM | POA: Diagnosis not present

## 2017-11-16 DIAGNOSIS — I10 Essential (primary) hypertension: Secondary | ICD-10-CM | POA: Diagnosis not present

## 2017-11-16 DIAGNOSIS — R32 Unspecified urinary incontinence: Secondary | ICD-10-CM

## 2017-11-16 DIAGNOSIS — H548 Legal blindness, as defined in USA: Secondary | ICD-10-CM | POA: Diagnosis not present

## 2017-11-16 DIAGNOSIS — R630 Anorexia: Secondary | ICD-10-CM | POA: Diagnosis not present

## 2017-11-16 DIAGNOSIS — G301 Alzheimer's disease with late onset: Secondary | ICD-10-CM | POA: Diagnosis not present

## 2017-11-16 DIAGNOSIS — I495 Sick sinus syndrome: Secondary | ICD-10-CM | POA: Diagnosis not present

## 2017-11-16 NOTE — Progress Notes (Signed)
Location:  [facility] Room Number: 30 Place of Service:  SNF (31) Provider:  Oneal Grout MD  Oneal Grout, MD  Patient Care Team: Oneal Grout, MD as PCP - General (Internal Medicine) Mast, Man X, NP as Nurse Practitioner (Nurse Practitioner) [place] [place], [place] Lowella Bandy., MD as Consulting Physician (Urology) Rachael Fee, MD as Consulting Physician (Gastroenterology) Salvatore Marvel, MD as Consulting Physician (Orthopedic Surgery) Venancio Poisson, MD as Consulting Physician (Dermatology) Sheral Apley, MD as Attending Physician (Orthopedic Surgery)  Extended Emergency Contact Information Primary Emergency Contact: Cook,[name] Address: [address]          [place], [place] [address] [place] of [place] Home Phone: [phone] Work Phone: [phone] Mobile Phone: [phone] Relation: Son Secondary Emergency Contact: Leyh,[name] Address: [address]          [place], [place] [address] [place] of [place] Home Phone: [phone] Relation: Relative  Code Status:  DNR/ under hospice service  Goals of care: Advanced Directive information Advanced Directives 11/16/2017  Does Patient Have a Medical Advance Directive? Yes  Type of Advance Directive Living will;Out of facility DNR (pink MOST or yellow form)  Does patient want to make changes to medical advance directive? No - Patient declined  Copy of Healthcare Power of Attorney in Chart? -  Pre-existing out of facility DNR order (yellow form or pink MOST form) Yellow form placed in chart (order not valid for inpatient use);Pink MOST form placed in chart (order not valid for inpatient use)     Chief Complaint  Patient presents with  . Medical Management of Chronic Issues    Routine Visit     HPI:  Pt is a 84 y.o. female seen today for medical management of chronic diseases. She has advanced dementia and does not participate in HPI and ROS. She is followed by hospice  service. [place]. od stable. Tolerating escitalopram well. Lorazepam 0.5 mg daily. She is under total care. She is legally blind. She does have periodic behavior issues with yelling- unsure if pain but morphine helps. Continues to have frial skin with easy bruising and skin tears. Takes nutritional supplement drinks at times. She needs 2 person assistance with transfer and is incontinent with her bowel and bladder. She is unable to participate in HPI and ROS.    Past Medical History:  Diagnosis Date  . Abnormality of gait 09/08/2003  . AF (atrial fibrillation) (HCC)   . Anxiety state, unspecified 08/19/2010  . Aortic stenosis   . Atrial fibrillation (HCC) 07/14/2009   Qualifier: Diagnosis of  By: [name], MD, Ruthann Cancer Ty Hilts   . Bradycardia   . Cardiac pacemaker in situ 09/12/2004  . Closed fracture of lumbar vertebra without mention of spinal cord injury [id number]  . Closed fracture of unspecified trochanteric section of femur 05/29/2011  . Constipation 03/14/2014  . Dementia 2013   04/03/14 MMSE 22/30   . Depression 04/10/2014  . Diffuse cystic mastopathy 12/23/2009  . Disturbance of salivary secretion [id number]  . Edema 08/18/2003  . Fall 12/19/2012   07/22/14 fall VS 144/76, 60, 20, 97.1. No apparent injury   . Flatulence, eructation, and gas pain 12/03/2009  . Fracture of greater trochanter of left femur (HCC) 03/08/14  . FTT (failure to thrive) in adult 12/15/2014  . Hearing loss   . Hemorrhoids   . Hip fracture (HCC)   . HTN (hypertension)   . Hypothyroidism 12/19/2007   Qualifier: Diagnosis of  By: Melvyn Neth CMA (AAMA), Patty  01/16/14 TSH 2.474 03/24/14 TSH 2.576    . 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 Loss of weight 10/13/2014   Multiple factorials: dementia, depression, advanced age, possible AR of medications(metformin and Oxybutynin)-will increase Mirtazapine and continue supplement. Observe.    . Macular degeneration (senile) of retina,  unspecified 12/12/2001  . Macular degeneration of both eyes 01/24/2013   Severe visual impairment. Nearly blind.   . Mitral valve prolapse   . Occlusion and stenosis of carotid artery without mention of cerebral infarction 12/27/2007  . Other and unspecified hyperlipidemia   . Other malaise and fatigue 12/06/2007  . Pacemaker-dual  medtronic 09/07/2011  . Personal history of fall 01/29/2009  . Rectal bleeding   . Senile osteoporosis 12/13/2000  . Sinus node dysfunction (HCC)   . Syncope and collapse 12/27/2007  . Trochanteric fracture of left femur (HCC) 03/14/2014   CT scan did however reveal acute fracture in greater left trochanteric. Dr. Margarita Ranaimothy Murphy consulted. Recommended nonoperative management and weightbearing as tolerated and another 3 weeks of Lovenox for DVT prophylaxis.       . Type 2 diabetes mellitus with blindness, with macular edema, with severe nonproliferative retinopathy 12/19/2007   Qualifier: Diagnosis of  By: Melvyn NethLewis CMA (AAMA), Patty  01/09/14 Hgb A1c 6.5 01/16/14 Hgb A1c 6.5 03/24/14 Hgb 6.4 12/10/14 pharm: dc Metformin and CBG monitoring-failure to thrive and refusing to eat.     Marland Kitchen. Unspecified hereditary and idiopathic peripheral neuropathy 10/28/202012  . Unspecified hypothyroidism 03/18/2010  . Unspecified urinary incontinence 02/22/2005  . Urinary tract infection, site not specified 04/10/2014  . Xerophthalmus 01/21/2015   Past Surgical History:  Procedure Laterality Date  . BREAST BIOPSY Left 1970   benign  . Left hip surgery  09/2005  . PACEMAKER INSERTION  2006   Medtronic In-Pulse  . Right hip surgery  08/2003  . TONSILLECTOMY    . 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 Codeine     unknown  . Ibuprofen     unknown  . Lisinopril     unknown  . Penicillins     unknown  . Sanctura [Trospium Chloride]     unknown    Outpatient Encounter Medications as of  11/16/2017  Medication Sig  . acetaminophen (TYLENOL) 325 MG tablet Take 650 mg by mouth every 4 (four) hours as needed for mild pain or moderate pain.   . bisacodyl (DULCOLAX) 10 MG suppository Place 10 mg rectally daily as needed for moderate constipation.   Marland Kitchen. escitalopram (LEXAPRO) 10 MG tablet Take 10 mg by mouth daily.   . feeding supplement (BOOST / RESOURCE BREEZE) LIQD Take 1 Container by mouth 3 (three) times daily between meals.  Marland Kitchen. LORazepam (ATIVAN) 0.5 MG tablet Take 0.5 mg by mouth daily. 12 PM  . LORazepam (ATIVAN) 1 MG tablet Take 1 mg by mouth daily. 4 PM  . morphine 20 MG/5ML solution Take by mouth every 2 (two) hours as needed for pain. Give 0.6725ml, sublingual  . NON FORMULARY Take 4 drops by mouth every 2 (two) hours as needed.  . sennosides-docusate sodium (SENOKOT-S) 8.6-50 MG tablet Take 2 tablets by mouth at bedtime. Reported on 12/11/2015  . [DISCONTINUED] atropine 1 % ophthalmic solution 4 drops every 2 (two) hours as needed. Place 4 drops on tongue  for excess secretions   No facility-administered encounter medications  on file as of 11/16/2017.     Review of Systems  Unable to perform ROS: Dementia    Immunization History  Administered Date(s) Administered  . Influenza Whole 05/10/2017  . Influenza-Unspecified 06/06/2013, 04/21/2015, 05/17/2016  . PPD Test 07/23/2011  . Pneumococcal Conjugate-13 03/14/2009  . Pneumococcal Polysaccharide-23 03/14/2009  . Td 03/14/2009  . Tdap 03/14/2009  . Zoster 06/29/2006, 06/29/2006   Pertinent  Health Maintenance Due  Topic Date Due  . OPHTHALMOLOGY EXAM  07/28/1927  . DEXA SCAN  07/27/1982  . PNA vac Low Risk Adult (2 of 2 - PCV13) 03/14/2010  . HEMOGLOBIN A1C  06/08/2017  . FOOT EXAM  12/05/2017  . URINE MICROALBUMIN  12/06/2017  . INFLUENZA VACCINE  03/01/2018   Fall Risk  03/30/2017 01/26/2015 03/06/2014  Falls in the past year? No Yes Yes  Number falls in past yr: - 1 1  Comment - - 03/06/14  Injury with Fall? -  Yes Yes  Comment - - left hip   Risk Factor Category  - - High Fall Risk  Risk for fall due to : - History of fall(s);Impaired balance/gait;Impaired mobility -  Follow up - Falls evaluation completed -   Functional Status Survey:    Vitals:   11/16/17 1046  BP: 120/60  Pulse: 60  Resp: 16  Temp: 98.2 F (36.8 C)  TempSrc: Oral  SpO2: 98%  Weight: 83 lb 4.8 oz (37.8 kg)  Height: 5\' 2"  (1.575 m)   Body mass index is 15.24 kg/m.   Wt Readings from Last 3 Encounters:  11/16/17 83 lb 4.8 oz (37.8 kg)  10/20/17 85 lb 12.8 oz (38.9 kg)  08/24/17 83 lb (37.6 kg)   Physical Exam  Constitutional:  Thin built, frail, in NAD  HENT:  Head: Normocephalic and atraumatic.  Right Ear: External ear normal.  Left Ear: External ear normal.  Nose: Nose normal.  Mouth/Throat: Oropharynx is clear and moist.  Eyes: Pupils are equal, round, and reactive to light. Conjunctivae are normal. Right eye exhibits no discharge. Left eye exhibits no discharge.  Legally blind  Neck: Normal range of motion. Neck supple.  Cardiovascular: Normal rate and regular rhythm.  Murmur heard. Pulmonary/Chest: Effort normal and breath sounds normal. She has no wheezes. She has no rales.  Abdominal: Soft. Bowel sounds are normal. There is no tenderness. There is no guarding.  Musculoskeletal: She exhibits deformity. She exhibits no edema.  Arthritis changes to her fingers. Needs 1-2 person assistance with transfer and is non ambulatory, total care, has contractures  Lymphadenopathy:    She has no cervical adenopathy.  Neurological:  Confused, non verbal  Skin: Skin is warm and dry. She is not diaphoretic.  Easy bruising, geri sleeves present  Psychiatric: She has a normal mood and affect.    Labs reviewed: Recent Labs    12/06/16  NA 142  K 3.9  BUN 24*  CREATININE 0.9   Recent Labs    12/06/16  AST 21  ALT 11  ALKPHOS 70   No results for input(s): WBC, NEUTROABS, HGB, HCT, MCV, PLT in the last  8760 hours. Lab Results  Component Value Date   TSH 3.55 01/27/2015   Lab Results  Component Value Date   HGBA1C 5.2 12/06/2016   Lab Results  Component Value Date   CHOL 140 12/20/2012   HDL 38 12/20/2012   LDLCALC 57 12/20/2012   TRIG 227 (A) 12/20/2012   CHOLHDL 5.0 12/22/2007    Significant Diagnostic Results in  last 30 days:  No results found.  Assessment/Plan  1. DM type 2 with diabetic background retinopathy (HCC) Controlled. Off cbg check and labs per family request. Supportive care.   2. Paroxysmal atrial fibrillation (HCC) Controlled HR. Off medications  3. Essential hypertension Controlled BP, off medications, monitor  4. Late onset Alzheimer's disease with behavioral disturbance Advanced deentia, supportive care, total care with ADLs, skin care, fall precautions. Continue escitalopram and lorazepam current regimen. Goal is for comfort care and pt is under hospice services  5. Legal blindness, as defined in [place] Supportive care  6. Severe protein-calorie malnutrition (HCC) Continue nutritional supplement, decline anticipated, monitor for pressure wounds, skin care  7. Urinary incontinence, unspecified type Continue perineal care for skin breakdown and MASD.  8. Depression with anxiety Continue escitalopram daily and ativan bid, supportive care  9. Slow transit constipation Continue senna s daily with prn dulcolax suppository     Family/ staff Communication: reviewed care plan with patient and charge nurse.    Labs/tests ordered:  none   Oneal Grout, MD Internal Medicine [facility] [address] [place], [place] [address] Cell Phone (Monday-Friday 8 am - 5 pm): [phone] On Call: [phone] and follow prompts after 5 pm and on weekends Office Phone: [phone] Office Fax: [phone]

## 2017-11-20 DIAGNOSIS — I495 Sick sinus syndrome: Secondary | ICD-10-CM | POA: Diagnosis not present

## 2017-11-20 DIAGNOSIS — F0151 Vascular dementia with behavioral disturbance: Secondary | ICD-10-CM | POA: Diagnosis not present

## 2017-11-20 DIAGNOSIS — R630 Anorexia: Secondary | ICD-10-CM | POA: Diagnosis not present

## 2017-11-20 DIAGNOSIS — E46 Unspecified protein-calorie malnutrition: Secondary | ICD-10-CM | POA: Diagnosis not present

## 2017-11-20 DIAGNOSIS — I4891 Unspecified atrial fibrillation: Secondary | ICD-10-CM | POA: Diagnosis not present

## 2017-11-20 DIAGNOSIS — R634 Abnormal weight loss: Secondary | ICD-10-CM | POA: Diagnosis not present

## 2017-11-21 DIAGNOSIS — R634 Abnormal weight loss: Secondary | ICD-10-CM | POA: Diagnosis not present

## 2017-11-21 DIAGNOSIS — E46 Unspecified protein-calorie malnutrition: Secondary | ICD-10-CM | POA: Diagnosis not present

## 2017-11-21 DIAGNOSIS — I495 Sick sinus syndrome: Secondary | ICD-10-CM | POA: Diagnosis not present

## 2017-11-21 DIAGNOSIS — R630 Anorexia: Secondary | ICD-10-CM | POA: Diagnosis not present

## 2017-11-21 DIAGNOSIS — F0151 Vascular dementia with behavioral disturbance: Secondary | ICD-10-CM | POA: Diagnosis not present

## 2017-11-21 DIAGNOSIS — I4891 Unspecified atrial fibrillation: Secondary | ICD-10-CM | POA: Diagnosis not present

## 2017-11-22 DIAGNOSIS — F0151 Vascular dementia with behavioral disturbance: Secondary | ICD-10-CM | POA: Diagnosis not present

## 2017-11-22 DIAGNOSIS — R634 Abnormal weight loss: Secondary | ICD-10-CM | POA: Diagnosis not present

## 2017-11-22 DIAGNOSIS — I495 Sick sinus syndrome: Secondary | ICD-10-CM | POA: Diagnosis not present

## 2017-11-22 DIAGNOSIS — E46 Unspecified protein-calorie malnutrition: Secondary | ICD-10-CM | POA: Diagnosis not present

## 2017-11-22 DIAGNOSIS — R630 Anorexia: Secondary | ICD-10-CM | POA: Diagnosis not present

## 2017-11-22 DIAGNOSIS — I4891 Unspecified atrial fibrillation: Secondary | ICD-10-CM | POA: Diagnosis not present

## 2017-11-24 DIAGNOSIS — I4891 Unspecified atrial fibrillation: Secondary | ICD-10-CM | POA: Diagnosis not present

## 2017-11-24 DIAGNOSIS — R630 Anorexia: Secondary | ICD-10-CM | POA: Diagnosis not present

## 2017-11-24 DIAGNOSIS — R634 Abnormal weight loss: Secondary | ICD-10-CM | POA: Diagnosis not present

## 2017-11-24 DIAGNOSIS — Z029 Encounter for administrative examinations, unspecified: Secondary | ICD-10-CM

## 2017-11-24 DIAGNOSIS — F0151 Vascular dementia with behavioral disturbance: Secondary | ICD-10-CM | POA: Diagnosis not present

## 2017-11-24 DIAGNOSIS — I495 Sick sinus syndrome: Secondary | ICD-10-CM | POA: Diagnosis not present

## 2017-11-24 DIAGNOSIS — E46 Unspecified protein-calorie malnutrition: Secondary | ICD-10-CM | POA: Diagnosis not present

## 2017-11-27 DIAGNOSIS — R634 Abnormal weight loss: Secondary | ICD-10-CM | POA: Diagnosis not present

## 2017-11-27 DIAGNOSIS — I495 Sick sinus syndrome: Secondary | ICD-10-CM | POA: Diagnosis not present

## 2017-11-27 DIAGNOSIS — R630 Anorexia: Secondary | ICD-10-CM | POA: Diagnosis not present

## 2017-11-27 DIAGNOSIS — F0151 Vascular dementia with behavioral disturbance: Secondary | ICD-10-CM | POA: Diagnosis not present

## 2017-11-27 DIAGNOSIS — I4891 Unspecified atrial fibrillation: Secondary | ICD-10-CM | POA: Diagnosis not present

## 2017-11-27 DIAGNOSIS — E46 Unspecified protein-calorie malnutrition: Secondary | ICD-10-CM | POA: Diagnosis not present

## 2017-11-29 DIAGNOSIS — I10 Essential (primary) hypertension: Secondary | ICD-10-CM | POA: Diagnosis not present

## 2017-11-29 DIAGNOSIS — E785 Hyperlipidemia, unspecified: Secondary | ICD-10-CM | POA: Diagnosis not present

## 2017-11-29 DIAGNOSIS — I4891 Unspecified atrial fibrillation: Secondary | ICD-10-CM | POA: Diagnosis not present

## 2017-11-29 DIAGNOSIS — E46 Unspecified protein-calorie malnutrition: Secondary | ICD-10-CM | POA: Diagnosis not present

## 2017-11-29 DIAGNOSIS — R634 Abnormal weight loss: Secondary | ICD-10-CM | POA: Diagnosis not present

## 2017-11-29 DIAGNOSIS — I495 Sick sinus syndrome: Secondary | ICD-10-CM | POA: Diagnosis not present

## 2017-11-29 DIAGNOSIS — F339 Major depressive disorder, recurrent, unspecified: Secondary | ICD-10-CM | POA: Diagnosis not present

## 2017-11-29 DIAGNOSIS — H3530 Unspecified macular degeneration: Secondary | ICD-10-CM | POA: Diagnosis not present

## 2017-11-29 DIAGNOSIS — E039 Hypothyroidism, unspecified: Secondary | ICD-10-CM | POA: Diagnosis not present

## 2017-11-29 DIAGNOSIS — M84359S Stress fracture, hip, unspecified, sequela: Secondary | ICD-10-CM | POA: Diagnosis not present

## 2017-11-29 DIAGNOSIS — E1159 Type 2 diabetes mellitus with other circulatory complications: Secondary | ICD-10-CM | POA: Diagnosis not present

## 2017-11-29 DIAGNOSIS — R630 Anorexia: Secondary | ICD-10-CM | POA: Diagnosis not present

## 2017-11-29 DIAGNOSIS — F0151 Vascular dementia with behavioral disturbance: Secondary | ICD-10-CM | POA: Diagnosis not present

## 2017-11-30 DIAGNOSIS — F0151 Vascular dementia with behavioral disturbance: Secondary | ICD-10-CM | POA: Diagnosis not present

## 2017-11-30 DIAGNOSIS — I4891 Unspecified atrial fibrillation: Secondary | ICD-10-CM | POA: Diagnosis not present

## 2017-11-30 DIAGNOSIS — I495 Sick sinus syndrome: Secondary | ICD-10-CM | POA: Diagnosis not present

## 2017-11-30 DIAGNOSIS — E46 Unspecified protein-calorie malnutrition: Secondary | ICD-10-CM | POA: Diagnosis not present

## 2017-11-30 DIAGNOSIS — R630 Anorexia: Secondary | ICD-10-CM | POA: Diagnosis not present

## 2017-11-30 DIAGNOSIS — R634 Abnormal weight loss: Secondary | ICD-10-CM | POA: Diagnosis not present

## 2017-12-01 DIAGNOSIS — F0151 Vascular dementia with behavioral disturbance: Secondary | ICD-10-CM | POA: Diagnosis not present

## 2017-12-01 DIAGNOSIS — R630 Anorexia: Secondary | ICD-10-CM | POA: Diagnosis not present

## 2017-12-01 DIAGNOSIS — E46 Unspecified protein-calorie malnutrition: Secondary | ICD-10-CM | POA: Diagnosis not present

## 2017-12-01 DIAGNOSIS — R634 Abnormal weight loss: Secondary | ICD-10-CM | POA: Diagnosis not present

## 2017-12-01 DIAGNOSIS — I495 Sick sinus syndrome: Secondary | ICD-10-CM | POA: Diagnosis not present

## 2017-12-01 DIAGNOSIS — I4891 Unspecified atrial fibrillation: Secondary | ICD-10-CM | POA: Diagnosis not present

## 2017-12-04 DIAGNOSIS — F0151 Vascular dementia with behavioral disturbance: Secondary | ICD-10-CM | POA: Diagnosis not present

## 2017-12-04 DIAGNOSIS — I495 Sick sinus syndrome: Secondary | ICD-10-CM | POA: Diagnosis not present

## 2017-12-04 DIAGNOSIS — R634 Abnormal weight loss: Secondary | ICD-10-CM | POA: Diagnosis not present

## 2017-12-04 DIAGNOSIS — R630 Anorexia: Secondary | ICD-10-CM | POA: Diagnosis not present

## 2017-12-04 DIAGNOSIS — I4891 Unspecified atrial fibrillation: Secondary | ICD-10-CM | POA: Diagnosis not present

## 2017-12-04 DIAGNOSIS — E46 Unspecified protein-calorie malnutrition: Secondary | ICD-10-CM | POA: Diagnosis not present

## 2017-12-06 DIAGNOSIS — E46 Unspecified protein-calorie malnutrition: Secondary | ICD-10-CM | POA: Diagnosis not present

## 2017-12-06 DIAGNOSIS — R630 Anorexia: Secondary | ICD-10-CM | POA: Diagnosis not present

## 2017-12-06 DIAGNOSIS — I495 Sick sinus syndrome: Secondary | ICD-10-CM | POA: Diagnosis not present

## 2017-12-06 DIAGNOSIS — R634 Abnormal weight loss: Secondary | ICD-10-CM | POA: Diagnosis not present

## 2017-12-06 DIAGNOSIS — I4891 Unspecified atrial fibrillation: Secondary | ICD-10-CM | POA: Diagnosis not present

## 2017-12-06 DIAGNOSIS — F0151 Vascular dementia with behavioral disturbance: Secondary | ICD-10-CM | POA: Diagnosis not present

## 2017-12-08 DIAGNOSIS — E46 Unspecified protein-calorie malnutrition: Secondary | ICD-10-CM | POA: Diagnosis not present

## 2017-12-08 DIAGNOSIS — F0151 Vascular dementia with behavioral disturbance: Secondary | ICD-10-CM | POA: Diagnosis not present

## 2017-12-08 DIAGNOSIS — I495 Sick sinus syndrome: Secondary | ICD-10-CM | POA: Diagnosis not present

## 2017-12-08 DIAGNOSIS — I4891 Unspecified atrial fibrillation: Secondary | ICD-10-CM | POA: Diagnosis not present

## 2017-12-08 DIAGNOSIS — R630 Anorexia: Secondary | ICD-10-CM | POA: Diagnosis not present

## 2017-12-08 DIAGNOSIS — R634 Abnormal weight loss: Secondary | ICD-10-CM | POA: Diagnosis not present

## 2017-12-11 DIAGNOSIS — R630 Anorexia: Secondary | ICD-10-CM | POA: Diagnosis not present

## 2017-12-11 DIAGNOSIS — F0151 Vascular dementia with behavioral disturbance: Secondary | ICD-10-CM | POA: Diagnosis not present

## 2017-12-11 DIAGNOSIS — I495 Sick sinus syndrome: Secondary | ICD-10-CM | POA: Diagnosis not present

## 2017-12-11 DIAGNOSIS — E46 Unspecified protein-calorie malnutrition: Secondary | ICD-10-CM | POA: Diagnosis not present

## 2017-12-11 DIAGNOSIS — I4891 Unspecified atrial fibrillation: Secondary | ICD-10-CM | POA: Diagnosis not present

## 2017-12-11 DIAGNOSIS — R634 Abnormal weight loss: Secondary | ICD-10-CM | POA: Diagnosis not present

## 2017-12-13 DIAGNOSIS — F0151 Vascular dementia with behavioral disturbance: Secondary | ICD-10-CM | POA: Diagnosis not present

## 2017-12-13 DIAGNOSIS — R634 Abnormal weight loss: Secondary | ICD-10-CM | POA: Diagnosis not present

## 2017-12-13 DIAGNOSIS — I495 Sick sinus syndrome: Secondary | ICD-10-CM | POA: Diagnosis not present

## 2017-12-13 DIAGNOSIS — E46 Unspecified protein-calorie malnutrition: Secondary | ICD-10-CM | POA: Diagnosis not present

## 2017-12-13 DIAGNOSIS — R630 Anorexia: Secondary | ICD-10-CM | POA: Diagnosis not present

## 2017-12-13 DIAGNOSIS — I4891 Unspecified atrial fibrillation: Secondary | ICD-10-CM | POA: Diagnosis not present

## 2017-12-14 DIAGNOSIS — F0151 Vascular dementia with behavioral disturbance: Secondary | ICD-10-CM | POA: Diagnosis not present

## 2017-12-14 DIAGNOSIS — R634 Abnormal weight loss: Secondary | ICD-10-CM | POA: Diagnosis not present

## 2017-12-14 DIAGNOSIS — I4891 Unspecified atrial fibrillation: Secondary | ICD-10-CM | POA: Diagnosis not present

## 2017-12-14 DIAGNOSIS — I495 Sick sinus syndrome: Secondary | ICD-10-CM | POA: Diagnosis not present

## 2017-12-14 DIAGNOSIS — R630 Anorexia: Secondary | ICD-10-CM | POA: Diagnosis not present

## 2017-12-14 DIAGNOSIS — E46 Unspecified protein-calorie malnutrition: Secondary | ICD-10-CM | POA: Diagnosis not present

## 2017-12-15 ENCOUNTER — Encounter: Payer: Self-pay | Admitting: Nurse Practitioner

## 2017-12-15 ENCOUNTER — Non-Acute Institutional Stay (SKILLED_NURSING_FACILITY): Payer: Medicare Other | Admitting: Nurse Practitioner

## 2017-12-15 DIAGNOSIS — F0281 Dementia in other diseases classified elsewhere with behavioral disturbance: Secondary | ICD-10-CM | POA: Diagnosis not present

## 2017-12-15 DIAGNOSIS — I4891 Unspecified atrial fibrillation: Secondary | ICD-10-CM | POA: Diagnosis not present

## 2017-12-15 DIAGNOSIS — K5901 Slow transit constipation: Secondary | ICD-10-CM

## 2017-12-15 DIAGNOSIS — G301 Alzheimer's disease with late onset: Secondary | ICD-10-CM | POA: Diagnosis not present

## 2017-12-15 DIAGNOSIS — F418 Other specified anxiety disorders: Secondary | ICD-10-CM | POA: Diagnosis not present

## 2017-12-15 DIAGNOSIS — E46 Unspecified protein-calorie malnutrition: Secondary | ICD-10-CM | POA: Diagnosis not present

## 2017-12-15 DIAGNOSIS — R634 Abnormal weight loss: Secondary | ICD-10-CM | POA: Diagnosis not present

## 2017-12-15 DIAGNOSIS — R630 Anorexia: Secondary | ICD-10-CM | POA: Diagnosis not present

## 2017-12-15 DIAGNOSIS — I495 Sick sinus syndrome: Secondary | ICD-10-CM | POA: Diagnosis not present

## 2017-12-15 DIAGNOSIS — F0151 Vascular dementia with behavioral disturbance: Secondary | ICD-10-CM | POA: Diagnosis not present

## 2017-12-15 DIAGNOSIS — F02818 Dementia in other diseases classified elsewhere, unspecified severity, with other behavioral disturbance: Secondary | ICD-10-CM

## 2017-12-15 NOTE — Assessment & Plan Note (Signed)
severe dementia, legally blind, she resides in SNF [facility], total care of ADLs, under hospice service, non ambulatory, she gets out of bed daily, on Morphine  q2h prn and Tylenol  q4h prn for comfort purpose.

## 2017-12-15 NOTE — Progress Notes (Signed)
Location:  [facility] Room Number: 30 Place of Service:  SNF (31) Provider:  Jazmin Vensel, ManXie  NP  Oneal Grout, MD  Patient Care Team: Oneal Grout, MD as PCP - General (Internal Medicine) Faryn Sieg X, NP as Nurse Practitioner (Nurse Practitioner) [place] [place], [place] Lowella Bandy., MD as Consulting Physician (Urology) Rachael Fee, MD as Consulting Physician (Gastroenterology) Salvatore Marvel, MD as Consulting Physician (Orthopedic Surgery) Venancio Poisson, MD as Consulting Physician (Dermatology) Sheral Apley, MD as Attending Physician (Orthopedic Surgery)  Extended Emergency Contact Information Primary Emergency Contact: Cook,[name] Address: [address]          [place], [place] [address] [place] of [place] Home Phone: [phone] Work Phone: [phone] Mobile Phone: [phone] Relation: Son Secondary Emergency Contact: Simonis,[name] Address: [address]          [place], [place] [address] [place] of [place] Home Phone: [phone] Relation: Relative  Code Status:  DNR Goals of care: Advanced Directive information Advanced Directives 12/15/2017  Does Patient Have a Medical Advance Directive? Yes  Type of Advance Directive Out of facility DNR (pink MOST or yellow form);Living will  Does patient want to make changes to medical advance directive? No - Patient declined  Copy of Healthcare Power of Attorney in Chart? -  Pre-existing out of facility DNR order (yellow form or pink MOST form) Yellow form placed in chart (order not valid for inpatient use);Pink MOST form placed in chart (order not valid for inpatient use)     Chief Complaint  Patient presents with  . Medical Management of Chronic Issues    F/U- DM, Afib, Alzheimers,    HPI:  Pt is a 84 y.o. female seen today for medical management of chronic diseases.     The patient has severe dementia, legally blind, she resides in SNF [facility], total care of ADLs, under  hospice service, non ambulatory, she gets out of bed daily, on Morphine  q2h prn and Tylenol  q4h prn for comfort purpose. Her mood is manged on Escitalopram  daily, Lorazepam  qd, 0.5mg  qd. No constipation while on Senokot S II qhs and prn Bisacodyl suppository  daily prn.  Past Medical History:  Diagnosis Date  . Abnormality of gait 09/08/2003  . AF (atrial fibrillation) (HCC)   . Anxiety state, unspecified 08/19/2010  . Aortic stenosis   . Atrial fibrillation (HCC) 07/14/2009   Qualifier: Diagnosis of  By: [name], MD, Ruthann Cancer Ty Hilts   . Bradycardia   . Cardiac pacemaker in situ 09/12/2004  . Closed fracture of lumbar vertebra without mention of spinal cord injury [id number]  . Closed fracture of unspecified trochanteric section of femur 05/29/2011  . Constipation 03/14/2014  . Dementia 2013   04/03/14 MMSE 22/30   . Depression 04/10/2014  . Diffuse cystic mastopathy 12/23/2009  . Disturbance of salivary secretion [id number]  . Edema 08/18/2003  . Fall 12/19/2012   07/22/14 fall VS 144/76, 60, 20, 97.1. No apparent injury   . Flatulence, eructation, and gas pain 12/03/2009  . Fracture of greater trochanter of left femur (HCC) 03/08/14  . FTT (failure to thrive) in adult 12/15/2014  . Hearing loss   . Hemorrhoids   . Hip fracture (HCC)   . HTN (hypertension)   . Hypothyroidism 12/19/2007   Qualifier: Diagnosis of  By: Melvyn Neth CMA (AAMA), Patty  01/16/14 TSH 2.474 03/24/14 TSH 2.576    . 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  Loss of weight 10/13/2014   Multiple factorials: dementia, depression, advanced age, possible AR of medications(metformin and Oxybutynin)-will increase Mirtazapine and continue supplement. Observe.    . Macular degeneration (senile) of retina, unspecified 12/12/2001  . Macular degeneration of both eyes 01/24/2013   Severe visual impairment. Nearly blind.   . Mitral valve prolapse   . Occlusion and stenosis of  carotid artery without mention of cerebral infarction 12/27/2007  . Other and unspecified hyperlipidemia   . Other malaise and fatigue 12/06/2007  . Pacemaker-dual  medtronic 09/07/2011  . Personal history of fall 01/29/2009  . Rectal bleeding   . Senile osteoporosis 12/13/2000  . Sinus node dysfunction (HCC)   . Syncope and collapse 12/27/2007  . Trochanteric fracture of left femur (HCC) 03/14/2014   CT scan did however reveal acute fracture in greater left trochanteric. Dr. Margarita Rana consulted. Recommended nonoperative management and weightbearing as tolerated and another 3 weeks of Lovenox for DVT prophylaxis.       . Type 2 diabetes mellitus with blindness, with macular edema, with severe nonproliferative retinopathy 12/19/2007   Qualifier: Diagnosis of  By: Melvyn Neth CMA (AAMA), Patty  01/09/14 Hgb A1c 6.5 01/16/14 Hgb A1c 6.5 03/24/14 Hgb 6.4 12/10/14 pharm: dc Metformin and CBG monitoring-failure to thrive and refusing to eat.     Marland Kitchen Unspecified hereditary and idiopathic peripheral neuropathy 10/28/202012  . Unspecified hypothyroidism 03/18/2010  . Unspecified urinary incontinence 02/22/2005  . Urinary tract infection, site not specified 04/10/2014  . Xerophthalmus 01/21/2015   Past Surgical History:  Procedure Laterality Date  . BREAST BIOPSY Left 1970   benign  . Left hip surgery  09/2005  . PACEMAKER INSERTION  2006   Medtronic In-Pulse  . Right hip surgery  08/2003  . TONSILLECTOMY    . 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 Codeine     unknown  . Ibuprofen     unknown  . Lisinopril     unknown  . Penicillins     unknown  . Sanctura [Trospium Chloride]     unknown    Outpatient Encounter Medications as of 12/15/2017  Medication Sig  . acetaminophen (TYLENOL) 325 MG tablet Take 650 mg by mouth every 4 (four) hours as needed for mild pain or moderate pain.   . bisacodyl  (DULCOLAX) 10 MG suppository Place 10 mg rectally daily as needed for moderate constipation.   Marland Kitchen escitalopram (LEXAPRO) 10 MG tablet Take 10 mg by mouth daily.   . feeding supplement (BOOST / RESOURCE BREEZE) LIQD Take 1 Container by mouth 3 (three) times daily between meals.  Marland Kitchen LORazepam (ATIVAN) 0.5 MG tablet Take 0.5 mg by mouth daily. 12 PM  . LORazepam (ATIVAN) 1 MG tablet Take 1 mg by mouth daily. 4 PM  . morphine 20 MG/5ML solution Take by mouth every 2 (two) hours as needed for pain. Give 0.40ml, sublingual  . NON FORMULARY Take 4 drops by mouth every 2 (two) hours as needed. atropine  . sennosides-docusate sodium (SENOKOT-S) 8.6-50 MG tablet Take 2 tablets by mouth at bedtime. Reported on 12/11/2015   No facility-administered encounter medications on file as of 12/15/2017.    ROS was provided with assistance of staff Review of Systems  Constitutional: Negative for activity change, appetite change, chills, diaphoresis, fatigue and fever.  HENT: Positive for hearing loss. Negative for congestion and voice change.   Respiratory: Negative for cough  and shortness of breath.   Cardiovascular: Negative for chest pain, palpitations and leg swelling.  Gastrointestinal: Negative for abdominal distention, abdominal pain, constipation, diarrhea, nausea and vomiting.  Genitourinary: Negative for difficulty urinating, dysuria and urgency.       Incontinent of urine.   Musculoskeletal: Positive for gait problem.       Non ambulatory  Skin: Negative for color change and pallor.  Neurological: Negative for dizziness, weakness and headaches.       Dementia, unable to void personal needs  Psychiatric/Behavioral: Positive for agitation, behavioral problems and confusion. Negative for hallucinations and sleep disturbance. The patient is nervous/anxious.     Immunization History  Administered Date(s) Administered  . Influenza Whole 05/10/2017  . Influenza-Unspecified 06/06/2013, 04/21/2015,  05/17/2016  . PPD Test 07/23/2011  . Pneumococcal Conjugate-13 03/14/2009  . Pneumococcal Polysaccharide-23 03/14/2009  . Td 03/14/2009  . Tdap 03/14/2009  . Zoster 06/29/2006, 06/29/2006   Pertinent  Health Maintenance Due  Topic Date Due  . OPHTHALMOLOGY EXAM  07/28/1927  . DEXA SCAN  07/27/1982  . PNA vac Low Risk Adult (2 of 2 - PCV13) 03/14/2010  . HEMOGLOBIN A1C  06/08/2017  . FOOT EXAM  12/05/2017  . URINE MICROALBUMIN  12/06/2017  . INFLUENZA VACCINE  03/01/2018   Fall Risk  03/30/2017 01/26/2015 03/06/2014  Falls in the past year? No Yes Yes  Number falls in past yr: - 1 1  Comment - - 03/06/14  Injury with Fall? - Yes Yes  Comment - - left hip   Risk Factor Category  - - High Fall Risk  Risk for fall due to : - History of fall(s);Impaired balance/gait;Impaired mobility -  Follow up - Falls evaluation completed -   Functional Status Survey:    Vitals:   12/15/17 1208  BP: (!) 150/62  Pulse: (!) 54  Resp: 20  Temp: 98 F (36.7 C)  SpO2: 98%  Weight: 87 lb 1.6 oz (39.5 kg)  Height:  (1.575 m)   Body mass index is 15.93 kg/m. Physical Exam  Constitutional: She appears well-developed and well-nourished. No distress.  HENT:  Head: Normocephalic and atraumatic.  Eyes: Pupils are equal, round, and reactive to light. EOM are normal.  Blind.   Neck: Normal range of motion. Neck supple. No JVD present. No thyromegaly present.  Cardiovascular: Normal rate and regular rhythm.  Murmur heard. Pulmonary/Chest: She has no wheezes. She has no rales.  Abdominal: Soft. Bowel sounds are normal.  Musculoskeletal:  Non ambulatory, mechanical lift or two person transfer.   Neurological: She is alert.  Oriented to self.   Skin: Skin is warm and dry. She is not diaphoretic.    Labs reviewed: No results for input(s): NA, K, CL, CO2, GLUCOSE, BUN, CREATININE, CALCIUM, MG, PHOS in the last 8760 hours. No results for input(s): AST, ALT, ALKPHOS, BILITOT, PROT, ALBUMIN in  the last 8760 hours. No results for input(s): WBC, NEUTROABS, HGB, HCT, MCV, PLT in the last 8760 hours. Lab Results  Component Value Date   TSH 3.55 01/27/2015   Lab Results  Component Value Date   HGBA1C 5.2 12/06/2016   Lab Results  Component Value Date   CHOL 140 12/20/2012   HDL 38 12/20/2012   LDLCALC 57 12/20/2012   TRIG 227 (A) 12/20/2012   CHOLHDL 5.0 12/22/2007    Significant Diagnostic Results in last 30 days:  No results found.  Assessment/Plan Alzheimer's dementia with behavioral disturbance  severe dementia, legally blind, she resides in SNF  [facility], total care of ADLs, under hospice service, non ambulatory, she gets out of bed daily, on Morphine  q2h prn and Tylenol  q4h prn for comfort purpose.   Depression with anxiety Her mood is manged, continue Escitalopram  daily, Lorazepam  qd, 0.5mg  qd.   Constipation No constipation, continue Senokot S II qhs and prn Bisacodyl suppository  daily prn.       Family/ staff Communication: plan of care reviewed with the patient and charge nurse.   Labs/tests ordered:  none  Time spend 25 minutes

## 2017-12-15 NOTE — Assessment & Plan Note (Signed)
Her mood is manged, continue Escitalopram  daily, Lorazepam  qd, 0.5mg  qd.

## 2017-12-15 NOTE — Assessment & Plan Note (Signed)
No constipation, continue Senokot S II qhs and prn Bisacodyl suppository  daily prn.

## 2017-12-18 DIAGNOSIS — R634 Abnormal weight loss: Secondary | ICD-10-CM | POA: Diagnosis not present

## 2017-12-18 DIAGNOSIS — I495 Sick sinus syndrome: Secondary | ICD-10-CM | POA: Diagnosis not present

## 2017-12-18 DIAGNOSIS — F0151 Vascular dementia with behavioral disturbance: Secondary | ICD-10-CM | POA: Diagnosis not present

## 2017-12-18 DIAGNOSIS — E46 Unspecified protein-calorie malnutrition: Secondary | ICD-10-CM | POA: Diagnosis not present

## 2017-12-18 DIAGNOSIS — I4891 Unspecified atrial fibrillation: Secondary | ICD-10-CM | POA: Diagnosis not present

## 2017-12-18 DIAGNOSIS — R630 Anorexia: Secondary | ICD-10-CM | POA: Diagnosis not present

## 2017-12-19 DIAGNOSIS — E46 Unspecified protein-calorie malnutrition: Secondary | ICD-10-CM | POA: Diagnosis not present

## 2017-12-19 DIAGNOSIS — R630 Anorexia: Secondary | ICD-10-CM | POA: Diagnosis not present

## 2017-12-19 DIAGNOSIS — F0151 Vascular dementia with behavioral disturbance: Secondary | ICD-10-CM | POA: Diagnosis not present

## 2017-12-19 DIAGNOSIS — R634 Abnormal weight loss: Secondary | ICD-10-CM | POA: Diagnosis not present

## 2017-12-19 DIAGNOSIS — I495 Sick sinus syndrome: Secondary | ICD-10-CM | POA: Diagnosis not present

## 2017-12-19 DIAGNOSIS — I4891 Unspecified atrial fibrillation: Secondary | ICD-10-CM | POA: Diagnosis not present

## 2017-12-20 DIAGNOSIS — F0151 Vascular dementia with behavioral disturbance: Secondary | ICD-10-CM | POA: Diagnosis not present

## 2017-12-20 DIAGNOSIS — I4891 Unspecified atrial fibrillation: Secondary | ICD-10-CM | POA: Diagnosis not present

## 2017-12-20 DIAGNOSIS — R630 Anorexia: Secondary | ICD-10-CM | POA: Diagnosis not present

## 2017-12-20 DIAGNOSIS — E46 Unspecified protein-calorie malnutrition: Secondary | ICD-10-CM | POA: Diagnosis not present

## 2017-12-20 DIAGNOSIS — I495 Sick sinus syndrome: Secondary | ICD-10-CM | POA: Diagnosis not present

## 2017-12-20 DIAGNOSIS — R634 Abnormal weight loss: Secondary | ICD-10-CM | POA: Diagnosis not present

## 2017-12-22 DIAGNOSIS — F0151 Vascular dementia with behavioral disturbance: Secondary | ICD-10-CM | POA: Diagnosis not present

## 2017-12-22 DIAGNOSIS — E46 Unspecified protein-calorie malnutrition: Secondary | ICD-10-CM | POA: Diagnosis not present

## 2017-12-22 DIAGNOSIS — I495 Sick sinus syndrome: Secondary | ICD-10-CM | POA: Diagnosis not present

## 2017-12-22 DIAGNOSIS — R630 Anorexia: Secondary | ICD-10-CM | POA: Diagnosis not present

## 2017-12-22 DIAGNOSIS — I4891 Unspecified atrial fibrillation: Secondary | ICD-10-CM | POA: Diagnosis not present

## 2017-12-22 DIAGNOSIS — R634 Abnormal weight loss: Secondary | ICD-10-CM | POA: Diagnosis not present

## 2017-12-26 DIAGNOSIS — I4891 Unspecified atrial fibrillation: Secondary | ICD-10-CM | POA: Diagnosis not present

## 2017-12-26 DIAGNOSIS — R634 Abnormal weight loss: Secondary | ICD-10-CM | POA: Diagnosis not present

## 2017-12-26 DIAGNOSIS — I495 Sick sinus syndrome: Secondary | ICD-10-CM | POA: Diagnosis not present

## 2017-12-26 DIAGNOSIS — E46 Unspecified protein-calorie malnutrition: Secondary | ICD-10-CM | POA: Diagnosis not present

## 2017-12-26 DIAGNOSIS — R630 Anorexia: Secondary | ICD-10-CM | POA: Diagnosis not present

## 2017-12-26 DIAGNOSIS — F0151 Vascular dementia with behavioral disturbance: Secondary | ICD-10-CM | POA: Diagnosis not present

## 2017-12-27 DIAGNOSIS — I495 Sick sinus syndrome: Secondary | ICD-10-CM | POA: Diagnosis not present

## 2017-12-27 DIAGNOSIS — F0151 Vascular dementia with behavioral disturbance: Secondary | ICD-10-CM | POA: Diagnosis not present

## 2017-12-27 DIAGNOSIS — R634 Abnormal weight loss: Secondary | ICD-10-CM | POA: Diagnosis not present

## 2017-12-27 DIAGNOSIS — I4891 Unspecified atrial fibrillation: Secondary | ICD-10-CM | POA: Diagnosis not present

## 2017-12-27 DIAGNOSIS — E46 Unspecified protein-calorie malnutrition: Secondary | ICD-10-CM | POA: Diagnosis not present

## 2017-12-27 DIAGNOSIS — R630 Anorexia: Secondary | ICD-10-CM | POA: Diagnosis not present

## 2017-12-29 DIAGNOSIS — E46 Unspecified protein-calorie malnutrition: Secondary | ICD-10-CM | POA: Diagnosis not present

## 2017-12-29 DIAGNOSIS — R634 Abnormal weight loss: Secondary | ICD-10-CM | POA: Diagnosis not present

## 2017-12-29 DIAGNOSIS — F0151 Vascular dementia with behavioral disturbance: Secondary | ICD-10-CM | POA: Diagnosis not present

## 2017-12-29 DIAGNOSIS — I495 Sick sinus syndrome: Secondary | ICD-10-CM | POA: Diagnosis not present

## 2017-12-29 DIAGNOSIS — R630 Anorexia: Secondary | ICD-10-CM | POA: Diagnosis not present

## 2017-12-29 DIAGNOSIS — I4891 Unspecified atrial fibrillation: Secondary | ICD-10-CM | POA: Diagnosis not present

## 2017-12-30 DIAGNOSIS — F0151 Vascular dementia with behavioral disturbance: Secondary | ICD-10-CM | POA: Diagnosis not present

## 2017-12-30 DIAGNOSIS — E1159 Type 2 diabetes mellitus with other circulatory complications: Secondary | ICD-10-CM | POA: Diagnosis not present

## 2017-12-30 DIAGNOSIS — E46 Unspecified protein-calorie malnutrition: Secondary | ICD-10-CM | POA: Diagnosis not present

## 2017-12-30 DIAGNOSIS — I4891 Unspecified atrial fibrillation: Secondary | ICD-10-CM | POA: Diagnosis not present

## 2017-12-30 DIAGNOSIS — R630 Anorexia: Secondary | ICD-10-CM | POA: Diagnosis not present

## 2017-12-30 DIAGNOSIS — H3530 Unspecified macular degeneration: Secondary | ICD-10-CM | POA: Diagnosis not present

## 2017-12-30 DIAGNOSIS — I495 Sick sinus syndrome: Secondary | ICD-10-CM | POA: Diagnosis not present

## 2017-12-30 DIAGNOSIS — R634 Abnormal weight loss: Secondary | ICD-10-CM | POA: Diagnosis not present

## 2017-12-30 DIAGNOSIS — E039 Hypothyroidism, unspecified: Secondary | ICD-10-CM | POA: Diagnosis not present

## 2017-12-30 DIAGNOSIS — I10 Essential (primary) hypertension: Secondary | ICD-10-CM | POA: Diagnosis not present

## 2017-12-30 DIAGNOSIS — F339 Major depressive disorder, recurrent, unspecified: Secondary | ICD-10-CM | POA: Diagnosis not present

## 2017-12-30 DIAGNOSIS — M84359S Stress fracture, hip, unspecified, sequela: Secondary | ICD-10-CM | POA: Diagnosis not present

## 2017-12-30 DIAGNOSIS — E785 Hyperlipidemia, unspecified: Secondary | ICD-10-CM | POA: Diagnosis not present

## 2018-01-01 DIAGNOSIS — R634 Abnormal weight loss: Secondary | ICD-10-CM | POA: Diagnosis not present

## 2018-01-01 DIAGNOSIS — R630 Anorexia: Secondary | ICD-10-CM | POA: Diagnosis not present

## 2018-01-01 DIAGNOSIS — I4891 Unspecified atrial fibrillation: Secondary | ICD-10-CM | POA: Diagnosis not present

## 2018-01-01 DIAGNOSIS — I495 Sick sinus syndrome: Secondary | ICD-10-CM | POA: Diagnosis not present

## 2018-01-01 DIAGNOSIS — E46 Unspecified protein-calorie malnutrition: Secondary | ICD-10-CM | POA: Diagnosis not present

## 2018-01-01 DIAGNOSIS — F0151 Vascular dementia with behavioral disturbance: Secondary | ICD-10-CM | POA: Diagnosis not present

## 2018-01-03 DIAGNOSIS — I495 Sick sinus syndrome: Secondary | ICD-10-CM | POA: Diagnosis not present

## 2018-01-03 DIAGNOSIS — I4891 Unspecified atrial fibrillation: Secondary | ICD-10-CM | POA: Diagnosis not present

## 2018-01-03 DIAGNOSIS — R630 Anorexia: Secondary | ICD-10-CM | POA: Diagnosis not present

## 2018-01-03 DIAGNOSIS — R634 Abnormal weight loss: Secondary | ICD-10-CM | POA: Diagnosis not present

## 2018-01-03 DIAGNOSIS — F0151 Vascular dementia with behavioral disturbance: Secondary | ICD-10-CM | POA: Diagnosis not present

## 2018-01-03 DIAGNOSIS — E46 Unspecified protein-calorie malnutrition: Secondary | ICD-10-CM | POA: Diagnosis not present

## 2018-01-05 DIAGNOSIS — I4891 Unspecified atrial fibrillation: Secondary | ICD-10-CM | POA: Diagnosis not present

## 2018-01-05 DIAGNOSIS — R634 Abnormal weight loss: Secondary | ICD-10-CM | POA: Diagnosis not present

## 2018-01-05 DIAGNOSIS — I495 Sick sinus syndrome: Secondary | ICD-10-CM | POA: Diagnosis not present

## 2018-01-05 DIAGNOSIS — F0151 Vascular dementia with behavioral disturbance: Secondary | ICD-10-CM | POA: Diagnosis not present

## 2018-01-05 DIAGNOSIS — R630 Anorexia: Secondary | ICD-10-CM | POA: Diagnosis not present

## 2018-01-05 DIAGNOSIS — E46 Unspecified protein-calorie malnutrition: Secondary | ICD-10-CM | POA: Diagnosis not present

## 2018-01-06 DIAGNOSIS — I4891 Unspecified atrial fibrillation: Secondary | ICD-10-CM | POA: Diagnosis not present

## 2018-01-06 DIAGNOSIS — E46 Unspecified protein-calorie malnutrition: Secondary | ICD-10-CM | POA: Diagnosis not present

## 2018-01-06 DIAGNOSIS — R634 Abnormal weight loss: Secondary | ICD-10-CM | POA: Diagnosis not present

## 2018-01-06 DIAGNOSIS — R630 Anorexia: Secondary | ICD-10-CM | POA: Diagnosis not present

## 2018-01-06 DIAGNOSIS — I495 Sick sinus syndrome: Secondary | ICD-10-CM | POA: Diagnosis not present

## 2018-01-06 DIAGNOSIS — F0151 Vascular dementia with behavioral disturbance: Secondary | ICD-10-CM | POA: Diagnosis not present

## 2018-01-08 DIAGNOSIS — R634 Abnormal weight loss: Secondary | ICD-10-CM | POA: Diagnosis not present

## 2018-01-08 DIAGNOSIS — I495 Sick sinus syndrome: Secondary | ICD-10-CM | POA: Diagnosis not present

## 2018-01-08 DIAGNOSIS — F0151 Vascular dementia with behavioral disturbance: Secondary | ICD-10-CM | POA: Diagnosis not present

## 2018-01-08 DIAGNOSIS — R630 Anorexia: Secondary | ICD-10-CM | POA: Diagnosis not present

## 2018-01-08 DIAGNOSIS — I4891 Unspecified atrial fibrillation: Secondary | ICD-10-CM | POA: Diagnosis not present

## 2018-01-08 DIAGNOSIS — E46 Unspecified protein-calorie malnutrition: Secondary | ICD-10-CM | POA: Diagnosis not present

## 2018-01-10 DIAGNOSIS — E46 Unspecified protein-calorie malnutrition: Secondary | ICD-10-CM | POA: Diagnosis not present

## 2018-01-10 DIAGNOSIS — F0151 Vascular dementia with behavioral disturbance: Secondary | ICD-10-CM | POA: Diagnosis not present

## 2018-01-10 DIAGNOSIS — I495 Sick sinus syndrome: Secondary | ICD-10-CM | POA: Diagnosis not present

## 2018-01-10 DIAGNOSIS — R634 Abnormal weight loss: Secondary | ICD-10-CM | POA: Diagnosis not present

## 2018-01-10 DIAGNOSIS — R630 Anorexia: Secondary | ICD-10-CM | POA: Diagnosis not present

## 2018-01-10 DIAGNOSIS — I4891 Unspecified atrial fibrillation: Secondary | ICD-10-CM | POA: Diagnosis not present

## 2018-01-11 DIAGNOSIS — R630 Anorexia: Secondary | ICD-10-CM | POA: Diagnosis not present

## 2018-01-11 DIAGNOSIS — E46 Unspecified protein-calorie malnutrition: Secondary | ICD-10-CM | POA: Diagnosis not present

## 2018-01-11 DIAGNOSIS — I495 Sick sinus syndrome: Secondary | ICD-10-CM | POA: Diagnosis not present

## 2018-01-11 DIAGNOSIS — I4891 Unspecified atrial fibrillation: Secondary | ICD-10-CM | POA: Diagnosis not present

## 2018-01-11 DIAGNOSIS — F0151 Vascular dementia with behavioral disturbance: Secondary | ICD-10-CM | POA: Diagnosis not present

## 2018-01-11 DIAGNOSIS — R634 Abnormal weight loss: Secondary | ICD-10-CM | POA: Diagnosis not present

## 2018-01-15 ENCOUNTER — Non-Acute Institutional Stay (SKILLED_NURSING_FACILITY): Payer: Medicare Other | Admitting: Nurse Practitioner

## 2018-01-15 ENCOUNTER — Encounter: Payer: Self-pay | Admitting: Nurse Practitioner

## 2018-01-15 DIAGNOSIS — F418 Other specified anxiety disorders: Secondary | ICD-10-CM

## 2018-01-15 DIAGNOSIS — R627 Adult failure to thrive: Secondary | ICD-10-CM | POA: Diagnosis not present

## 2018-01-15 DIAGNOSIS — K5901 Slow transit constipation: Secondary | ICD-10-CM

## 2018-01-15 DIAGNOSIS — R634 Abnormal weight loss: Secondary | ICD-10-CM | POA: Diagnosis not present

## 2018-01-15 DIAGNOSIS — I495 Sick sinus syndrome: Secondary | ICD-10-CM | POA: Diagnosis not present

## 2018-01-15 DIAGNOSIS — F0151 Vascular dementia with behavioral disturbance: Secondary | ICD-10-CM | POA: Diagnosis not present

## 2018-01-15 DIAGNOSIS — E46 Unspecified protein-calorie malnutrition: Secondary | ICD-10-CM | POA: Diagnosis not present

## 2018-01-15 DIAGNOSIS — I4891 Unspecified atrial fibrillation: Secondary | ICD-10-CM | POA: Diagnosis not present

## 2018-01-15 DIAGNOSIS — R630 Anorexia: Secondary | ICD-10-CM | POA: Diagnosis not present

## 2018-01-15 NOTE — Assessment & Plan Note (Signed)
Her mood is managed with Escitalopram 10mg  daily, Lorazepam 0.5mg  qd, 1mg  qd.

## 2018-01-15 NOTE — Assessment & Plan Note (Signed)
Managed, continue Senokot S II qhs, prn Bisacodyl suppository qd.

## 2018-01-15 NOTE — Assessment & Plan Note (Signed)
Progressing gradually, continue supportive care, prn Tylenol available to her.

## 2018-01-15 NOTE — Progress Notes (Signed)
Location:  [facility] Room Number: 30 Place of Service:  SNF (31) Provider:  Laurrie Toppin, ManXie  NP  Oneal Grout, MD  Patient Care Team: Oneal Grout, MD as PCP - General (Internal Medicine) Orpha Dain X, NP as Nurse Practitioner (Nurse Practitioner) [place] [place], [place] Lowella Bandy., MD as Consulting Physician (Urology) Rachael Fee, MD as Consulting Physician (Gastroenterology) Salvatore Marvel, MD as Consulting Physician (Orthopedic Surgery) Venancio Poisson, MD as Consulting Physician (Dermatology) Sheral Apley, MD as Attending Physician (Orthopedic Surgery)  Extended Emergency Contact Information Primary Emergency Contact: Cook,[name] Address: [address]          [place], [place] [address] [place] of [place] Home Phone: [phone] Work Phone: [phone] Mobile Phone: [phone] Relation: Son Secondary Emergency Contact: Strebel,[name] Address: [address]          [place], [place] [address] [place] of [place] Home Phone: [phone] Relation: Relative  Code Status:  DNR Goals of care: Advanced Directive information Advanced Directives 12/15/2017  Does Patient Have a Medical Advance Directive? Yes  Type of Advance Directive Out of facility DNR (pink MOST or yellow form);Living will  Does patient want to make changes to medical advance directive? No - Patient declined  Copy of Healthcare Power of Attorney in Chart? -  Pre-existing out of facility DNR order (yellow form or pink MOST form) Yellow form placed in chart (order not valid for inpatient use);Pink MOST form placed in chart (order not valid for inpatient use)     Chief Complaint  Patient presents with  . Medical Management of Chronic Issues    F/U- Alzheimers, depression, afib,     HPI:  Pt is a 84 y.o. female seen today for medical management of chronic diseases.     The patient has history of dementia, total care of ADL, no longer ambulatory, two person  transfer. She is under Hospice service for comfort measures, prn q2h Morphine 5mg  available to her. Her mood is managed on Escitalopram 10mg  qd, Lorazepam 0.5mg  daily, 1mg  qd. No constipation while on Senokot S II qhs, Bisacodyl 10ng suppository prn daily. She has Tylenol 650mg  q4h prn available to pain or fever as needed.  Past Medical History:  Diagnosis Date  . Abnormality of gait 09/08/2003  . AF (atrial fibrillation) (HCC)   . Anxiety state, unspecified 08/19/2010  . Aortic stenosis   . Atrial fibrillation (HCC) 07/14/2009   Qualifier: Diagnosis of  By: [name], MD, Ruthann Cancer Ty Hilts   . Bradycardia   . Cardiac pacemaker in situ 09/12/2004  . Closed fracture of lumbar vertebra without mention of spinal cord injury [id number]  . Closed fracture of unspecified trochanteric section of femur 05/29/2011  . Constipation 03/14/2014  . Dementia 2013   04/03/14 MMSE 22/30   . Depression 04/10/2014  . Diffuse cystic mastopathy 12/23/2009  . Disturbance of salivary secretion [id number]  . Edema 08/18/2003  . Fall 12/19/2012   07/22/14 fall VS 144/76, 60, 20, 97.1. No apparent injury   . Flatulence, eructation, and gas pain 12/03/2009  . Fracture of greater trochanter of left femur (HCC) 03/08/14  . FTT (failure to thrive) in adult 12/15/2014  . Hearing loss   . Hemorrhoids   . Hip fracture (HCC)   . HTN (hypertension)   . Hypothyroidism 12/19/2007   Qualifier: Diagnosis of  By: Melvyn Neth CMA (AAMA), Patty  01/16/14 TSH 2.474 03/24/14 TSH 2.576    . Legal blindness, as defined in [place] 03/20/1999   secondary  to macular degeneration  . Loss of weight 10/13/2014   Multiple factorials: dementia, depression, advanced age, possible AR of medications(metformin and Oxybutynin)-will increase Mirtazapine and continue supplement. Observe.    . Macular degeneration (senile) of retina, unspecified 12/12/2001  . Macular degeneration of both eyes 01/24/2013   Severe visual impairment. Nearly blind.   . Mitral valve prolapse   .  Occlusion and stenosis of carotid artery without mention of cerebral infarction 12/27/2007  . Other and unspecified hyperlipidemia   . Other malaise and fatigue 12/06/2007  . Pacemaker-dual  medtronic 09/07/2011  . Personal history of fall 01/29/2009  . Rectal bleeding   . Senile osteoporosis 12/13/2000  . Sinus node dysfunction (HCC)   . Syncope and collapse 12/27/2007  . Trochanteric fracture of left femur (HCC) 03/14/2014   CT scan did however reveal acute fracture in greater left trochanteric. Dr. Margarita Rana consulted. Recommended nonoperative management and weightbearing as tolerated and another 3 weeks of Lovenox for DVT prophylaxis.       . Type 2 diabetes mellitus with blindness, with macular edema, with severe nonproliferative retinopathy 12/19/2007   Qualifier: Diagnosis of  By: Melvyn Neth CMA (AAMA), Patty  01/09/14 Hgb A1c 6.5 01/16/14 Hgb A1c 6.5 03/24/14 Hgb 6.4 12/10/14 pharm: dc Metformin and CBG monitoring-failure to thrive and refusing to eat.     Marland Kitchen Unspecified hereditary and idiopathic peripheral neuropathy 10/28/202012  . Unspecified hypothyroidism 03/18/2010  . Unspecified urinary incontinence 02/22/2005  . Urinary tract infection, site not specified 04/10/2014  . Xerophthalmus 01/21/2015   Past Surgical History:  Procedure Laterality Date  . BREAST BIOPSY Left 1970   benign  . Left hip surgery  09/2005  . PACEMAKER INSERTION  2006   Medtronic In-Pulse  . Right hip surgery  08/2003  . TONSILLECTOMY    . 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 Codeine     unknown  . Ibuprofen     unknown  . Lisinopril     unknown  . Penicillins     unknown  . Sanctura [Trospium Chloride]     unknown    Outpatient Encounter Medications as of 01/15/2018  Medication Sig  . acetaminophen (TYLENOL) 325 MG tablet Take 650 mg by mouth every 4 (four) hours as needed for mild pain or moderate  pain.   . bisacodyl (DULCOLAX) 10 MG suppository Place 10 mg rectally daily as needed for moderate constipation.   Marland Kitchen escitalopram (LEXAPRO) 10 MG tablet Take 10 mg by mouth daily.   . feeding supplement (BOOST / RESOURCE BREEZE) LIQD Take 1 Container by mouth 3 (three) times daily between meals.  Marland Kitchen LORazepam (ATIVAN) 0.5 MG tablet Take 0.5 mg by mouth daily. 12 PM  . LORazepam (ATIVAN) 1 MG tablet Take 1 mg by mouth daily. 4 PM  . morphine 20 MG/5ML solution Take by mouth every 2 (two) hours as needed for pain. Give 0.22ml, sublingual  . NON FORMULARY Take 4 drops by mouth every 2 (two) hours as needed. atropine  . sennosides-docusate sodium (SENOKOT-S) 8.6-50 MG tablet Take 2 tablets by mouth at bedtime. Reported on 12/11/2015   No facility-administered encounter medications on file as of 01/15/2018.    ROS was provided with assistance of staff Review of Systems  Constitutional: Positive for fatigue. Negative for activity change, appetite change, chills, diaphoresis and fever.  HENT: Positive for hearing loss. Negative for congestion, trouble swallowing  and voice change.   Eyes: Positive for visual disturbance.       Legally blind.   Respiratory: Negative for cough, choking, shortness of breath and wheezing.   Cardiovascular: Negative for chest pain, palpitations and leg swelling.  Gastrointestinal: Negative for abdominal distention, abdominal pain, constipation, diarrhea, nausea and vomiting.  Genitourinary: Negative for difficulty urinating, dysuria and urgency.  Musculoskeletal: Positive for gait problem.  Skin: Negative for color change and pallor.  Neurological: Negative for dizziness, speech difficulty, weakness and headaches.       Dementia  Psychiatric/Behavioral: Positive for agitation, behavioral problems and confusion. Negative for hallucinations and sleep disturbance. The patient is nervous/anxious.     Immunization History  Administered Date(s) Administered  . Influenza  Whole 05/10/2017  . Influenza-Unspecified 06/06/2013, 04/21/2015, 05/17/2016  . PPD Test 07/23/2011  . Pneumococcal Conjugate-13 03/14/2009  . Pneumococcal Polysaccharide-23 03/14/2009  . Td 03/14/2009  . Tdap 03/14/2009  . Zoster 06/29/2006, 06/29/2006   Pertinent  Health Maintenance Due  Topic Date Due  . OPHTHALMOLOGY EXAM  07/28/1927  . DEXA SCAN  07/27/1982  . PNA vac Low Risk Adult (2 of 2 - PCV13) 03/14/2010  . HEMOGLOBIN A1C  06/08/2017  . FOOT EXAM  12/05/2017  . URINE MICROALBUMIN  12/06/2017  . INFLUENZA VACCINE  03/01/2018   Fall Risk  03/30/2017 01/26/2015 03/06/2014  Falls in the past year? No Yes Yes  Number falls in past yr: - 1 1  Comment - - 03/06/14  Injury with Fall? - Yes Yes  Comment - - left hip   Risk Factor Category  - - High Fall Risk  Risk for fall due to : - History of fall(s);Impaired balance/gait;Impaired mobility -  Follow up - Falls evaluation completed -   Functional Status Survey:    Vitals:   01/15/18 1348  BP: 130/60  Pulse: 60  Resp: 20  Temp: 98.2 F (36.8 C)  Weight: 85 lb 3.2 oz (38.6 kg)  Height: 5\' 2"  (1.575 m)   Body mass index is 15.58 kg/m. Physical Exam  Constitutional: She appears well-developed and well-nourished.  HENT:  Head: Normocephalic and atraumatic.  Eyes: Pupils are equal, round, and reactive to light. EOM are normal.  Legally blind.   Neck: Normal range of motion. Neck supple. No JVD present. No thyromegaly present.  Cardiovascular: Normal rate and regular rhythm.  Murmur heard. Pulmonary/Chest: Effort normal and breath sounds normal. She has no wheezes. She has no rales.  Abdominal: Soft. Bowel sounds are normal. She exhibits no distension. There is no tenderness.  Musculoskeletal: She exhibits no edema.  Mechanical lift or two person transfer, recliner once out of bed.   Neurological: She is alert. No cranial nerve deficit. She exhibits normal muscle tone. Coordination normal.  Oriented to self.   Skin:  Skin is warm and dry.  Psychiatric: She has a normal mood and affect.    Labs reviewed: No results for input(s): NA, K, CL, CO2, GLUCOSE, BUN, CREATININE, CALCIUM, MG, PHOS in the last 8760 hours. No results for input(s): AST, ALT, ALKPHOS, BILITOT, PROT, ALBUMIN in the last 8760 hours. No results for input(s): WBC, NEUTROABS, HGB, HCT, MCV, PLT in the last 8760 hours. Lab Results  Component Value Date   TSH 3.55 01/27/2015   Lab Results  Component Value Date   HGBA1C 5.2 12/06/2016   Lab Results  Component Value Date   CHOL 140 12/20/2012   HDL 38 12/20/2012   LDLCALC 57 12/20/2012   TRIG 227 (A)  12/20/2012   CHOLHDL 5.0 12/22/2007    Significant Diagnostic Results in last 30 days:  No results found.  Assessment/Plan FTT (failure to thrive) in adult Progressing gradually, continue supportive care, prn Tylenol available to her.   Depression with anxiety Her mood is managed with Escitalopram 10mg  daily, Lorazepam 0.5mg  qd, 1mg  qd.   Constipation Managed, continue Senokot S II qhs, prn Bisacodyl suppository qd.      Family/ staff Communication: plan of care reviewed with the patient and charge nurse.   Labs/tests ordered:  none  Time spend 25 minutes.

## 2018-01-16 ENCOUNTER — Encounter: Payer: Self-pay | Admitting: Nurse Practitioner

## 2018-01-17 DIAGNOSIS — I495 Sick sinus syndrome: Secondary | ICD-10-CM | POA: Diagnosis not present

## 2018-01-17 DIAGNOSIS — R630 Anorexia: Secondary | ICD-10-CM | POA: Diagnosis not present

## 2018-01-17 DIAGNOSIS — F0151 Vascular dementia with behavioral disturbance: Secondary | ICD-10-CM | POA: Diagnosis not present

## 2018-01-17 DIAGNOSIS — E46 Unspecified protein-calorie malnutrition: Secondary | ICD-10-CM | POA: Diagnosis not present

## 2018-01-17 DIAGNOSIS — I4891 Unspecified atrial fibrillation: Secondary | ICD-10-CM | POA: Diagnosis not present

## 2018-01-17 DIAGNOSIS — R634 Abnormal weight loss: Secondary | ICD-10-CM | POA: Diagnosis not present

## 2018-01-22 DIAGNOSIS — E46 Unspecified protein-calorie malnutrition: Secondary | ICD-10-CM | POA: Diagnosis not present

## 2018-01-22 DIAGNOSIS — I495 Sick sinus syndrome: Secondary | ICD-10-CM | POA: Diagnosis not present

## 2018-01-22 DIAGNOSIS — R634 Abnormal weight loss: Secondary | ICD-10-CM | POA: Diagnosis not present

## 2018-01-22 DIAGNOSIS — I4891 Unspecified atrial fibrillation: Secondary | ICD-10-CM | POA: Diagnosis not present

## 2018-01-22 DIAGNOSIS — R630 Anorexia: Secondary | ICD-10-CM | POA: Diagnosis not present

## 2018-01-22 DIAGNOSIS — F0151 Vascular dementia with behavioral disturbance: Secondary | ICD-10-CM | POA: Diagnosis not present

## 2018-01-24 DIAGNOSIS — R634 Abnormal weight loss: Secondary | ICD-10-CM | POA: Diagnosis not present

## 2018-01-24 DIAGNOSIS — E46 Unspecified protein-calorie malnutrition: Secondary | ICD-10-CM | POA: Diagnosis not present

## 2018-01-24 DIAGNOSIS — I4891 Unspecified atrial fibrillation: Secondary | ICD-10-CM | POA: Diagnosis not present

## 2018-01-24 DIAGNOSIS — R630 Anorexia: Secondary | ICD-10-CM | POA: Diagnosis not present

## 2018-01-24 DIAGNOSIS — F0151 Vascular dementia with behavioral disturbance: Secondary | ICD-10-CM | POA: Diagnosis not present

## 2018-01-24 DIAGNOSIS — I495 Sick sinus syndrome: Secondary | ICD-10-CM | POA: Diagnosis not present

## 2018-01-25 DIAGNOSIS — R634 Abnormal weight loss: Secondary | ICD-10-CM | POA: Diagnosis not present

## 2018-01-25 DIAGNOSIS — E46 Unspecified protein-calorie malnutrition: Secondary | ICD-10-CM | POA: Diagnosis not present

## 2018-01-25 DIAGNOSIS — I495 Sick sinus syndrome: Secondary | ICD-10-CM | POA: Diagnosis not present

## 2018-01-25 DIAGNOSIS — R630 Anorexia: Secondary | ICD-10-CM | POA: Diagnosis not present

## 2018-01-25 DIAGNOSIS — F0151 Vascular dementia with behavioral disturbance: Secondary | ICD-10-CM | POA: Diagnosis not present

## 2018-01-25 DIAGNOSIS — I4891 Unspecified atrial fibrillation: Secondary | ICD-10-CM | POA: Diagnosis not present

## 2018-01-29 DIAGNOSIS — E46 Unspecified protein-calorie malnutrition: Secondary | ICD-10-CM | POA: Diagnosis not present

## 2018-01-29 DIAGNOSIS — I10 Essential (primary) hypertension: Secondary | ICD-10-CM | POA: Diagnosis not present

## 2018-01-29 DIAGNOSIS — H3530 Unspecified macular degeneration: Secondary | ICD-10-CM | POA: Diagnosis not present

## 2018-01-29 DIAGNOSIS — E039 Hypothyroidism, unspecified: Secondary | ICD-10-CM | POA: Diagnosis not present

## 2018-01-29 DIAGNOSIS — E1159 Type 2 diabetes mellitus with other circulatory complications: Secondary | ICD-10-CM | POA: Diagnosis not present

## 2018-01-29 DIAGNOSIS — R630 Anorexia: Secondary | ICD-10-CM | POA: Diagnosis not present

## 2018-01-29 DIAGNOSIS — I495 Sick sinus syndrome: Secondary | ICD-10-CM | POA: Diagnosis not present

## 2018-01-29 DIAGNOSIS — F0151 Vascular dementia with behavioral disturbance: Secondary | ICD-10-CM | POA: Diagnosis not present

## 2018-01-29 DIAGNOSIS — R634 Abnormal weight loss: Secondary | ICD-10-CM | POA: Diagnosis not present

## 2018-01-29 DIAGNOSIS — F339 Major depressive disorder, recurrent, unspecified: Secondary | ICD-10-CM | POA: Diagnosis not present

## 2018-01-29 DIAGNOSIS — I4891 Unspecified atrial fibrillation: Secondary | ICD-10-CM | POA: Diagnosis not present

## 2018-01-29 DIAGNOSIS — E785 Hyperlipidemia, unspecified: Secondary | ICD-10-CM | POA: Diagnosis not present

## 2018-01-29 DIAGNOSIS — M84359S Stress fracture, hip, unspecified, sequela: Secondary | ICD-10-CM | POA: Diagnosis not present

## 2018-01-31 DIAGNOSIS — E46 Unspecified protein-calorie malnutrition: Secondary | ICD-10-CM | POA: Diagnosis not present

## 2018-01-31 DIAGNOSIS — I4891 Unspecified atrial fibrillation: Secondary | ICD-10-CM | POA: Diagnosis not present

## 2018-01-31 DIAGNOSIS — R634 Abnormal weight loss: Secondary | ICD-10-CM | POA: Diagnosis not present

## 2018-01-31 DIAGNOSIS — F0151 Vascular dementia with behavioral disturbance: Secondary | ICD-10-CM | POA: Diagnosis not present

## 2018-01-31 DIAGNOSIS — I495 Sick sinus syndrome: Secondary | ICD-10-CM | POA: Diagnosis not present

## 2018-01-31 DIAGNOSIS — R630 Anorexia: Secondary | ICD-10-CM | POA: Diagnosis not present

## 2018-02-05 DIAGNOSIS — E46 Unspecified protein-calorie malnutrition: Secondary | ICD-10-CM | POA: Diagnosis not present

## 2018-02-05 DIAGNOSIS — F0151 Vascular dementia with behavioral disturbance: Secondary | ICD-10-CM | POA: Diagnosis not present

## 2018-02-05 DIAGNOSIS — I4891 Unspecified atrial fibrillation: Secondary | ICD-10-CM | POA: Diagnosis not present

## 2018-02-05 DIAGNOSIS — R630 Anorexia: Secondary | ICD-10-CM | POA: Diagnosis not present

## 2018-02-05 DIAGNOSIS — R634 Abnormal weight loss: Secondary | ICD-10-CM | POA: Diagnosis not present

## 2018-02-05 DIAGNOSIS — I495 Sick sinus syndrome: Secondary | ICD-10-CM | POA: Diagnosis not present

## 2018-02-07 DIAGNOSIS — R634 Abnormal weight loss: Secondary | ICD-10-CM | POA: Diagnosis not present

## 2018-02-07 DIAGNOSIS — F0151 Vascular dementia with behavioral disturbance: Secondary | ICD-10-CM | POA: Diagnosis not present

## 2018-02-07 DIAGNOSIS — I495 Sick sinus syndrome: Secondary | ICD-10-CM | POA: Diagnosis not present

## 2018-02-07 DIAGNOSIS — I4891 Unspecified atrial fibrillation: Secondary | ICD-10-CM | POA: Diagnosis not present

## 2018-02-07 DIAGNOSIS — R630 Anorexia: Secondary | ICD-10-CM | POA: Diagnosis not present

## 2018-02-07 DIAGNOSIS — E46 Unspecified protein-calorie malnutrition: Secondary | ICD-10-CM | POA: Diagnosis not present

## 2018-02-08 DIAGNOSIS — I495 Sick sinus syndrome: Secondary | ICD-10-CM | POA: Diagnosis not present

## 2018-02-08 DIAGNOSIS — R630 Anorexia: Secondary | ICD-10-CM | POA: Diagnosis not present

## 2018-02-08 DIAGNOSIS — I4891 Unspecified atrial fibrillation: Secondary | ICD-10-CM | POA: Diagnosis not present

## 2018-02-08 DIAGNOSIS — F0151 Vascular dementia with behavioral disturbance: Secondary | ICD-10-CM | POA: Diagnosis not present

## 2018-02-08 DIAGNOSIS — R634 Abnormal weight loss: Secondary | ICD-10-CM | POA: Diagnosis not present

## 2018-02-08 DIAGNOSIS — E46 Unspecified protein-calorie malnutrition: Secondary | ICD-10-CM | POA: Diagnosis not present

## 2018-02-09 ENCOUNTER — Encounter: Payer: Self-pay | Admitting: Internal Medicine

## 2018-02-09 ENCOUNTER — Non-Acute Institutional Stay (SKILLED_NURSING_FACILITY): Payer: Medicare Other | Admitting: Internal Medicine

## 2018-02-09 DIAGNOSIS — R32 Unspecified urinary incontinence: Secondary | ICD-10-CM | POA: Diagnosis not present

## 2018-02-09 DIAGNOSIS — G301 Alzheimer's disease with late onset: Secondary | ICD-10-CM

## 2018-02-09 DIAGNOSIS — K5901 Slow transit constipation: Secondary | ICD-10-CM | POA: Diagnosis not present

## 2018-02-09 DIAGNOSIS — F0281 Dementia in other diseases classified elsewhere with behavioral disturbance: Secondary | ICD-10-CM | POA: Diagnosis not present

## 2018-02-09 DIAGNOSIS — R627 Adult failure to thrive: Secondary | ICD-10-CM

## 2018-02-09 DIAGNOSIS — E43 Unspecified severe protein-calorie malnutrition: Secondary | ICD-10-CM | POA: Diagnosis not present

## 2018-02-09 DIAGNOSIS — I10 Essential (primary) hypertension: Secondary | ICD-10-CM

## 2018-02-09 NOTE — Progress Notes (Signed)
Location:  [facility] Room Number: 30 Place of Service:  SNF (31) Provider:  Oneal Grout MD  Oneal Grout, MD  Patient Care Team: Oneal Grout, MD as PCP - General (Internal Medicine) Mast, Man X, NP as Nurse Practitioner (Nurse Practitioner) [facility] [place], [place] Lowella Bandy., MD as Consulting Physician (Urology) Rachael Fee, MD as Consulting Physician (Gastroenterology) Salvatore Marvel, MD as Consulting Physician (Orthopedic Surgery) Venancio Poisson, MD as Consulting Physician (Dermatology) Sheral Apley, MD as Attending Physician (Orthopedic Surgery)  Extended Emergency Contact Information Primary Emergency Contact: Cook,[name] Address: [address]          [place], [place] [address] [place] Home Phone: [phone] Work Phone: [phone] Mobile Phone: [phone] Relation: Son Secondary Emergency Contact: Shill,[name] Address: [address]          [place], [place] [address] [place] of [place] Home Phone: [phone] Relation: Relative  Code Status:   DNR/ HOSPICE Goals of care: Advanced Directive information Advanced Directives 02/09/2018  Does Patient Have a Medical Advance Directive? Yes  Type of Advance Directive Living will;Out of facility DNR (pink MOST or yellow form)  Does patient want to make changes to medical advance directive? No - Patient declined  Copy of Healthcare Power of Attorney in Chart? -  Pre-existing out of facility DNR order (yellow form or pink MOST form) Yellow form placed in chart (order not valid for inpatient use);Pink MOST form placed in chart (order not valid for inpatient use)     Chief Complaint  Patient presents with  . Medical Management of Chronic Issues    Routine Visit     HPI:  Pt is a 84 y.o. female seen today for medical management of chronic diseases. She is under hospice service. She has advanced dementia and does not participate in HPI and ROS. She is under  total care. Continues to have skin tear with frail skin. No fall reported. Has occasional behavioral issues, needs redirection and supportive care. recieves morphine as needed for pain/ discomfort. She required manual disimpaction few days back. She had a bowel movement on her own yesterday.    Past Medical History:  Diagnosis Date  . Abnormality of gait 09/08/2003  . AF (atrial fibrillation) (HCC)   . Anxiety state, unspecified 08/19/2010  . Aortic stenosis   . Atrial fibrillation (HCC) 07/14/2009   Qualifier: Diagnosis of  By: [name], MD, Ruthann Cancer Ty Hilts   . Bradycardia   . Cardiac pacemaker in situ 09/12/2004  . Closed fracture of lumbar vertebra without mention of spinal cord injury [id number]  . Closed fracture of unspecified trochanteric section of femur 05/29/2011  . Constipation 03/14/2014  . Dementia 2013   04/03/14 MMSE 22/30   . Depression 04/10/2014  . Diffuse cystic mastopathy 12/23/2009  . Disturbance of salivary secretion [id number]  . Edema 08/18/2003  . Fall 12/19/2012   07/22/14 fall VS 144/76, 60, 20, 97.1. No apparent injury   . Flatulence, eructation, and gas pain 12/03/2009  . Fracture of greater trochanter of left femur (HCC) 03/08/14  . FTT (failure to thrive) in adult 12/15/2014  . Hearing loss   . Hemorrhoids   . Hip fracture (HCC)   . HTN (hypertension)   . Hypothyroidism 12/19/2007   Qualifier: Diagnosis of  By: Melvyn Neth CMA (AAMA), Patty  01/16/14 TSH 2.474 03/24/14 TSH 2.576    . 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 Loss of weight  10/13/2014   Multiple factorials: dementia, depression, advanced age, possible AR of medications(metformin and Oxybutynin)-will increase Mirtazapine and continue supplement. Observe.    . Macular degeneration (senile) of retina, unspecified 12/12/2001  . Macular degeneration of both eyes 01/24/2013   Severe visual impairment. Nearly blind.   . Mitral valve prolapse   . Occlusion and stenosis of  carotid artery without mention of cerebral infarction 12/27/2007  . Other and unspecified hyperlipidemia   . Other malaise and fatigue 12/06/2007  . Pacemaker-dual  medtronic 09/07/2011  . Personal history of fall 01/29/2009  . Rectal bleeding   . Senile osteoporosis 12/13/2000  . Sinus node dysfunction (HCC)   . Syncope and collapse 12/27/2007  . Trochanteric fracture of left femur (HCC) 03/14/2014   CT scan did however reveal acute fracture in greater left trochanteric. Dr. Margarita Rana consulted. Recommended nonoperative management and weightbearing as tolerated and another 3 weeks of Lovenox for DVT prophylaxis.       . Type 2 diabetes mellitus with blindness, with macular edema, with severe nonproliferative retinopathy 12/19/2007   Qualifier: Diagnosis of  By: Melvyn Neth CMA (AAMA), Patty  01/09/14 Hgb A1c 6.5 01/16/14 Hgb A1c 6.5 03/24/14 Hgb 6.4 12/10/14 pharm: dc Metformin and CBG monitoring-failure to thrive and refusing to eat.     Marland Kitchen Unspecified hereditary and idiopathic peripheral neuropathy 10/28/202012  . Unspecified hypothyroidism 03/18/2010  . Unspecified urinary incontinence 02/22/2005  . Urinary tract infection, site not specified 04/10/2014  . Xerophthalmus 01/21/2015   Past Surgical History:  Procedure Laterality Date  . BREAST BIOPSY Left 1970   benign  . Left hip surgery  09/2005  . PACEMAKER INSERTION  2006   Medtronic In-Pulse  . Right hip surgery  08/2003  . TONSILLECTOMY    . 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 Codeine     unknown  . Ibuprofen     unknown  . Lisinopril     unknown  . Penicillins     unknown  . Sanctura [Trospium Chloride]     unknown    Outpatient Encounter Medications as of 02/09/2018  Medication Sig  . acetaminophen (TYLENOL) 325 MG tablet Take 650 mg by mouth every 4 (four) hours as needed for mild pain or moderate pain.   . bisacodyl  (DULCOLAX) 10 MG suppository Place 10 mg rectally daily as needed for moderate constipation.   Marland Kitchen escitalopram (LEXAPRO) 10 MG tablet Take 10 mg by mouth daily.   . feeding supplement (BOOST / RESOURCE BREEZE) LIQD Take 1 Container by mouth 3 (three) times daily between meals.  Marland Kitchen LORazepam (ATIVAN) 0.5 MG tablet Take 0.5 mg by mouth daily. 12 PM  . LORazepam (ATIVAN) 1 MG tablet Take 1 mg by mouth daily. 4 PM  . morphine 20 MG/5ML solution Take by mouth every 2 (two) hours as needed for pain. Give 0.55ml, sublingual  . NON FORMULARY Take 4 drops by mouth every 2 (two) hours as needed. atropine  . sennosides-docusate sodium (SENOKOT-S) 8.6-50 MG tablet Take 2 tablets by mouth at bedtime. Reported on 12/11/2015   No facility-administered encounter medications on file as of 02/09/2018.     Review of Systems  Unable to perform ROS: Dementia    Immunization History  Administered Date(s) Administered  . Influenza Whole 05/10/2017  . Influenza-Unspecified 06/06/2013, 04/21/2015, 05/17/2016  . PPD Test 07/23/2011  . Pneumococcal Polysaccharide-23 03/14/2009  . Td 03/14/2009  .  Tdap 03/14/2009  . Zoster 06/29/2006, 06/29/2006   Pertinent  Health Maintenance Due  Topic Date Due  . PNA vac Low Risk Adult (2 of 2 - PCV13) 03/14/2010  . HEMOGLOBIN A1C  06/08/2017  . FOOT EXAM  12/05/2017  . URINE MICROALBUMIN  12/06/2017  . DEXA SCAN  01/23/2019 (Originally 07/27/1982)  . INFLUENZA VACCINE  03/01/2018  . OPHTHALMOLOGY EXAM  Discontinued   Fall Risk  03/30/2017 01/26/2015 03/06/2014  Falls in the past year? No Yes Yes  Number falls in past yr: - 1 1  Comment - - 03/06/14  Injury with Fall? - Yes Yes  Comment - - left hip   Risk Factor Category  - - High Fall Risk  Risk for fall due to : - History of fall(s);Impaired balance/gait;Impaired mobility -  Follow up - Falls evaluation completed -   Functional Status Survey:    Vitals:   02/09/18 1130  BP: 120/60  Pulse: 66  Resp: 16  Temp:  (!) 97.3 F (36.3 C)  TempSrc: Oral  SpO2: 94%  Weight: 86 lb 14.4 oz (39.4 kg)  Height: 5\' 2"  (1.575 m)   Body mass index is 15.89 kg/m.   Wt Readings from Last 3 Encounters:  02/09/18 86 lb 14.4 oz (39.4 kg)  01/15/18 85 lb 3.2 oz (38.6 kg)  12/15/17 87 lb 1.6 oz (39.5 kg)   Physical Exam  Constitutional: No distress.  Thin built, frail, chronically ill appearing female  HENT:  Head: Normocephalic and atraumatic.  Right Ear: External ear normal.  Left Ear: External ear normal.  Mouth/Throat: Oropharynx is clear and moist.  Eyes: Conjunctivae are normal. Right eye exhibits no discharge. Left eye exhibits no discharge.  Legally blind  Neck: Normal range of motion. Neck supple.  Cardiovascular: Normal rate and regular rhythm.  Murmur heard. Pulmonary/Chest: Effort normal and breath sounds normal. She has no wheezes. She has no rales. She exhibits no tenderness.  Abdominal: Soft. Bowel sounds are normal. There is no tenderness.  Musculoskeletal: She exhibits deformity. She exhibits no edema.  Under total care, on geri chair, mechanical lift and 2 people assist for transfers  Lymphadenopathy:    She has no cervical adenopathy.  Neurological:  Alert and oriented only to self  Skin: Skin is warm and dry. She is not diaphoretic.    Labs reviewed: No results for input(s): NA, K, CL, CO2, GLUCOSE, BUN, CREATININE, CALCIUM, MG, PHOS in the last 8760 hours. No results for input(s): AST, ALT, ALKPHOS, BILITOT, PROT, ALBUMIN in the last 8760 hours. No results for input(s): WBC, NEUTROABS, HGB, HCT, MCV, PLT in the last 8760 hours. Lab Results  Component Value Date   TSH 3.55 01/27/2015   Lab Results  Component Value Date   HGBA1C 5.2 12/06/2016   Lab Results  Component Value Date   CHOL 140 12/20/2012   HDL 38 12/20/2012   LDLCALC 57 12/20/2012   TRIG 227 (A) 12/20/2012   CHOLHDL 5.0 12/22/2007    Significant Diagnostic Results in last 30 days:  No results  found.  Assessment/Plan  1. Essential hypertension Controlled on review of her weekly BP readings. Off medication. monitor  2. Late onset Alzheimer's disease with behavioral disturbance Continue escitalopram and ativan to help with behavior, followed by hospice team as well. Continue morphine as needed. Supportive care  3. FTT (failure to thrive) in adult Supportive care, pressure ulcer prophylaxis, fall precautions, total care  4. Slow transit constipation Continue senokot s and prn suppository  5. Severe protein-calorie malnutrition (HCC) Continue nutritional supplement, decline anticipated with her FTT with advanced dementia  6. Incontinence in female Supportive care, perineal care    Family/ staff Communication: reviewed care plan with patient's nurse.    Labs/tests ordered:  none   Oneal Grout, MD Internal Medicine [facility] [address] [place], [place] [address] Cell Phone (Monday-Friday 8 am - 5 pm): [phone] On Call: [phone] and follow prompts after 5 pm and on weekends Office Phone: [phone] Office Fax: [phone]

## 2018-02-12 DIAGNOSIS — I4891 Unspecified atrial fibrillation: Secondary | ICD-10-CM | POA: Diagnosis not present

## 2018-02-12 DIAGNOSIS — E46 Unspecified protein-calorie malnutrition: Secondary | ICD-10-CM | POA: Diagnosis not present

## 2018-02-12 DIAGNOSIS — I495 Sick sinus syndrome: Secondary | ICD-10-CM | POA: Diagnosis not present

## 2018-02-12 DIAGNOSIS — F0151 Vascular dementia with behavioral disturbance: Secondary | ICD-10-CM | POA: Diagnosis not present

## 2018-02-12 DIAGNOSIS — R634 Abnormal weight loss: Secondary | ICD-10-CM | POA: Diagnosis not present

## 2018-02-12 DIAGNOSIS — R630 Anorexia: Secondary | ICD-10-CM | POA: Diagnosis not present

## 2018-02-14 DIAGNOSIS — I495 Sick sinus syndrome: Secondary | ICD-10-CM | POA: Diagnosis not present

## 2018-02-14 DIAGNOSIS — I4891 Unspecified atrial fibrillation: Secondary | ICD-10-CM | POA: Diagnosis not present

## 2018-02-14 DIAGNOSIS — E46 Unspecified protein-calorie malnutrition: Secondary | ICD-10-CM | POA: Diagnosis not present

## 2018-02-14 DIAGNOSIS — R630 Anorexia: Secondary | ICD-10-CM | POA: Diagnosis not present

## 2018-02-14 DIAGNOSIS — F0151 Vascular dementia with behavioral disturbance: Secondary | ICD-10-CM | POA: Diagnosis not present

## 2018-02-14 DIAGNOSIS — R634 Abnormal weight loss: Secondary | ICD-10-CM | POA: Diagnosis not present

## 2018-02-19 DIAGNOSIS — R634 Abnormal weight loss: Secondary | ICD-10-CM | POA: Diagnosis not present

## 2018-02-19 DIAGNOSIS — F0151 Vascular dementia with behavioral disturbance: Secondary | ICD-10-CM | POA: Diagnosis not present

## 2018-02-19 DIAGNOSIS — E46 Unspecified protein-calorie malnutrition: Secondary | ICD-10-CM | POA: Diagnosis not present

## 2018-02-19 DIAGNOSIS — I4891 Unspecified atrial fibrillation: Secondary | ICD-10-CM | POA: Diagnosis not present

## 2018-02-19 DIAGNOSIS — I495 Sick sinus syndrome: Secondary | ICD-10-CM | POA: Diagnosis not present

## 2018-02-19 DIAGNOSIS — R630 Anorexia: Secondary | ICD-10-CM | POA: Diagnosis not present

## 2018-02-21 DIAGNOSIS — F0151 Vascular dementia with behavioral disturbance: Secondary | ICD-10-CM | POA: Diagnosis not present

## 2018-02-21 DIAGNOSIS — E46 Unspecified protein-calorie malnutrition: Secondary | ICD-10-CM | POA: Diagnosis not present

## 2018-02-21 DIAGNOSIS — R634 Abnormal weight loss: Secondary | ICD-10-CM | POA: Diagnosis not present

## 2018-02-21 DIAGNOSIS — I495 Sick sinus syndrome: Secondary | ICD-10-CM | POA: Diagnosis not present

## 2018-02-21 DIAGNOSIS — R630 Anorexia: Secondary | ICD-10-CM | POA: Diagnosis not present

## 2018-02-21 DIAGNOSIS — I4891 Unspecified atrial fibrillation: Secondary | ICD-10-CM | POA: Diagnosis not present

## 2018-02-26 DIAGNOSIS — F0151 Vascular dementia with behavioral disturbance: Secondary | ICD-10-CM | POA: Diagnosis not present

## 2018-02-26 DIAGNOSIS — E46 Unspecified protein-calorie malnutrition: Secondary | ICD-10-CM | POA: Diagnosis not present

## 2018-02-26 DIAGNOSIS — R634 Abnormal weight loss: Secondary | ICD-10-CM | POA: Diagnosis not present

## 2018-02-26 DIAGNOSIS — I4891 Unspecified atrial fibrillation: Secondary | ICD-10-CM | POA: Diagnosis not present

## 2018-02-26 DIAGNOSIS — R630 Anorexia: Secondary | ICD-10-CM | POA: Diagnosis not present

## 2018-02-26 DIAGNOSIS — I495 Sick sinus syndrome: Secondary | ICD-10-CM | POA: Diagnosis not present

## 2018-02-28 DIAGNOSIS — E46 Unspecified protein-calorie malnutrition: Secondary | ICD-10-CM | POA: Diagnosis not present

## 2018-02-28 DIAGNOSIS — R634 Abnormal weight loss: Secondary | ICD-10-CM | POA: Diagnosis not present

## 2018-02-28 DIAGNOSIS — F0151 Vascular dementia with behavioral disturbance: Secondary | ICD-10-CM | POA: Diagnosis not present

## 2018-02-28 DIAGNOSIS — I4891 Unspecified atrial fibrillation: Secondary | ICD-10-CM | POA: Diagnosis not present

## 2018-02-28 DIAGNOSIS — I495 Sick sinus syndrome: Secondary | ICD-10-CM | POA: Diagnosis not present

## 2018-02-28 DIAGNOSIS — R630 Anorexia: Secondary | ICD-10-CM | POA: Diagnosis not present

## 2018-03-01 DIAGNOSIS — I495 Sick sinus syndrome: Secondary | ICD-10-CM | POA: Diagnosis not present

## 2018-03-01 DIAGNOSIS — I4891 Unspecified atrial fibrillation: Secondary | ICD-10-CM | POA: Diagnosis not present

## 2018-03-01 DIAGNOSIS — H3530 Unspecified macular degeneration: Secondary | ICD-10-CM | POA: Diagnosis not present

## 2018-03-01 DIAGNOSIS — E46 Unspecified protein-calorie malnutrition: Secondary | ICD-10-CM | POA: Diagnosis not present

## 2018-03-01 DIAGNOSIS — E1159 Type 2 diabetes mellitus with other circulatory complications: Secondary | ICD-10-CM | POA: Diagnosis not present

## 2018-03-01 DIAGNOSIS — M84359S Stress fracture, hip, unspecified, sequela: Secondary | ICD-10-CM | POA: Diagnosis not present

## 2018-03-01 DIAGNOSIS — I10 Essential (primary) hypertension: Secondary | ICD-10-CM | POA: Diagnosis not present

## 2018-03-01 DIAGNOSIS — F0151 Vascular dementia with behavioral disturbance: Secondary | ICD-10-CM | POA: Diagnosis not present

## 2018-03-01 DIAGNOSIS — F339 Major depressive disorder, recurrent, unspecified: Secondary | ICD-10-CM | POA: Diagnosis not present

## 2018-03-01 DIAGNOSIS — R630 Anorexia: Secondary | ICD-10-CM | POA: Diagnosis not present

## 2018-03-01 DIAGNOSIS — E039 Hypothyroidism, unspecified: Secondary | ICD-10-CM | POA: Diagnosis not present

## 2018-03-01 DIAGNOSIS — R634 Abnormal weight loss: Secondary | ICD-10-CM | POA: Diagnosis not present

## 2018-03-01 DIAGNOSIS — E785 Hyperlipidemia, unspecified: Secondary | ICD-10-CM | POA: Diagnosis not present

## 2018-03-05 DIAGNOSIS — F0151 Vascular dementia with behavioral disturbance: Secondary | ICD-10-CM | POA: Diagnosis not present

## 2018-03-05 DIAGNOSIS — I495 Sick sinus syndrome: Secondary | ICD-10-CM | POA: Diagnosis not present

## 2018-03-05 DIAGNOSIS — R630 Anorexia: Secondary | ICD-10-CM | POA: Diagnosis not present

## 2018-03-05 DIAGNOSIS — I4891 Unspecified atrial fibrillation: Secondary | ICD-10-CM | POA: Diagnosis not present

## 2018-03-05 DIAGNOSIS — R634 Abnormal weight loss: Secondary | ICD-10-CM | POA: Diagnosis not present

## 2018-03-05 DIAGNOSIS — E46 Unspecified protein-calorie malnutrition: Secondary | ICD-10-CM | POA: Diagnosis not present

## 2018-03-07 DIAGNOSIS — R634 Abnormal weight loss: Secondary | ICD-10-CM | POA: Diagnosis not present

## 2018-03-07 DIAGNOSIS — F0151 Vascular dementia with behavioral disturbance: Secondary | ICD-10-CM | POA: Diagnosis not present

## 2018-03-07 DIAGNOSIS — I4891 Unspecified atrial fibrillation: Secondary | ICD-10-CM | POA: Diagnosis not present

## 2018-03-07 DIAGNOSIS — E46 Unspecified protein-calorie malnutrition: Secondary | ICD-10-CM | POA: Diagnosis not present

## 2018-03-07 DIAGNOSIS — R630 Anorexia: Secondary | ICD-10-CM | POA: Diagnosis not present

## 2018-03-07 DIAGNOSIS — I495 Sick sinus syndrome: Secondary | ICD-10-CM | POA: Diagnosis not present

## 2018-03-09 DIAGNOSIS — F0151 Vascular dementia with behavioral disturbance: Secondary | ICD-10-CM | POA: Diagnosis not present

## 2018-03-09 DIAGNOSIS — I4891 Unspecified atrial fibrillation: Secondary | ICD-10-CM | POA: Diagnosis not present

## 2018-03-09 DIAGNOSIS — R634 Abnormal weight loss: Secondary | ICD-10-CM | POA: Diagnosis not present

## 2018-03-09 DIAGNOSIS — E46 Unspecified protein-calorie malnutrition: Secondary | ICD-10-CM | POA: Diagnosis not present

## 2018-03-09 DIAGNOSIS — I495 Sick sinus syndrome: Secondary | ICD-10-CM | POA: Diagnosis not present

## 2018-03-09 DIAGNOSIS — R630 Anorexia: Secondary | ICD-10-CM | POA: Diagnosis not present

## 2018-03-12 ENCOUNTER — Encounter: Payer: Self-pay | Admitting: Nurse Practitioner

## 2018-03-12 ENCOUNTER — Non-Acute Institutional Stay (SKILLED_NURSING_FACILITY): Payer: Medicare Other | Admitting: Nurse Practitioner

## 2018-03-12 DIAGNOSIS — F418 Other specified anxiety disorders: Secondary | ICD-10-CM

## 2018-03-12 DIAGNOSIS — E46 Unspecified protein-calorie malnutrition: Secondary | ICD-10-CM | POA: Diagnosis not present

## 2018-03-12 DIAGNOSIS — R634 Abnormal weight loss: Secondary | ICD-10-CM | POA: Diagnosis not present

## 2018-03-12 DIAGNOSIS — F0281 Dementia in other diseases classified elsewhere with behavioral disturbance: Secondary | ICD-10-CM | POA: Diagnosis not present

## 2018-03-12 DIAGNOSIS — R627 Adult failure to thrive: Secondary | ICD-10-CM

## 2018-03-12 DIAGNOSIS — F0151 Vascular dementia with behavioral disturbance: Secondary | ICD-10-CM | POA: Diagnosis not present

## 2018-03-12 DIAGNOSIS — G301 Alzheimer's disease with late onset: Secondary | ICD-10-CM

## 2018-03-12 DIAGNOSIS — I4891 Unspecified atrial fibrillation: Secondary | ICD-10-CM | POA: Diagnosis not present

## 2018-03-12 DIAGNOSIS — K5901 Slow transit constipation: Secondary | ICD-10-CM

## 2018-03-12 DIAGNOSIS — F02818 Dementia in other diseases classified elsewhere, unspecified severity, with other behavioral disturbance: Secondary | ICD-10-CM

## 2018-03-12 DIAGNOSIS — R630 Anorexia: Secondary | ICD-10-CM | POA: Diagnosis not present

## 2018-03-12 DIAGNOSIS — I495 Sick sinus syndrome: Secondary | ICD-10-CM | POA: Diagnosis not present

## 2018-03-12 NOTE — Progress Notes (Signed)
Location:  [facility] Room Number: 30 Place of Service:  SNF (31) Provider:  Farron Lafond, ManXie  NP  Oneal GroutPandey, Mahima, MD  Patient Care Team: Oneal GroutPandey, Mahima, MD as PCP - General (Internal Medicine) Sruthi Maurer X, NP as Nurse Practitioner (Nurse Practitioner) [facility] [place] Lowella BandyM Jr., MD as Consulting Physician (Urology) Rachael FeeJacobs, Daniel P, MD as Consulting Physician (Gastroenterology) Salvatore MarvelWainer, Robert, MD as Consulting Physician (Orthopedic Surgery) Venancio PoissonLomax, Laura, MD as Consulting Physician (Dermatology) Sheral ApleyMurphy, Timothy D, MD as Attending Physician (Orthopedic Surgery)  Extended Emergency Contact Information Primary Emergency Contact: Cook,[name] Address: [address]          [place], [place] [address] [place] Home Phone: [phone] Work Phone: [phone] Mobile Phone: [phone] Relation: Son Secondary Emergency Contact: Phung,[name] Address: [address]          [place], [place] [address] [place] Home Phone: [phone] Relation: Relative  Code Status:  DNR Goals of care: Advanced Directive information Advanced Directives 03/12/2018  Does Patient Have a Medical Advance Directive? Yes  Type of Advance Directive Living will;Out of facility DNR (pink MOST or yellow form)  Does patient want to make changes to medical advance directive? No - Patient declined  Copy of Healthcare Power of Attorney in Chart? -  Pre-existing out of facility DNR order (yellow form or pink MOST form) Yellow form placed in chart (order not valid for inpatient use);Pink MOST form placed in chart (order not valid for inpatient use)     Chief Complaint  Patient presents with  . Medical Management of Chronic Issues    F/u- HTN Alzheimers, FTT,     HPI:  Pt is a [age] y.o. female seen today for medical management of chronic diseases.     The patient has advanced stage of dementia, she is total dependent of ADLs, she is in recliner when  gets out of bed. Her mood is managed with Escitalopram 10mg  qd, Lorazepam 1mg  qd, 0.5mg  qd. No constipation while on Senokot S II qhs, MOM prn, prn Bisacodyl 10mg  suppository. Her goal of care is comfort measures, under Hospice service, on Morphine 5mg  q2h prn.  Past Medical History:  Diagnosis Date  . Abnormality of gait 09/08/2003  . AF (atrial fibrillation) (HCC)   . Anxiety state, unspecified 08/19/2010  . Aortic stenosis   . Atrial fibrillation (HCC) 07/14/2009   Qualifier: Diagnosis of  By: Graciela HusbandsKlein, MD, Ruthann CancerFACC, Ty HiltsSteven Cochran   . Bradycardia   . Cardiac pacemaker in situ 09/12/2004  . Closed fracture of lumbar vertebra without mention of spinal cord injury [id number]  . Closed fracture of unspecified trochanteric section of femur 05/29/2011  . Constipation 03/14/2014  . Dementia 2013   04/03/14 MMSE 22/30   . Depression 04/10/2014  . Diffuse cystic mastopathy 12/23/2009  . Disturbance of salivary secretion [id number]  . Edema 08/18/2003  . Fall 12/19/2012   07/22/14 fall VS 144/76, 60, 20, 97.1. No apparent injury   . Flatulence, eructation, and gas pain 12/03/2009  . Fracture of greater trochanter of left femur (HCC) 03/08/14  . FTT (failure to thrive) in adult 12/15/2014  . Hearing loss   . Hemorrhoids   . Hip fracture (HCC)   . HTN (hypertension)   . Hypothyroidism 12/19/2007   Qualifier: Diagnosis of  By: Melvyn NethLewis CMA (AAMA), Patty  01/16/14 TSH 2.474 03/24/14 TSH 2.576    . 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 Loss  of weight 10/13/2014   Multiple factorials: dementia, depression, advanced age, possible AR of medications(metformin and Oxybutynin)-will increase Mirtazapine and continue supplement. Observe.    . Macular degeneration (senile) of retina, unspecified 12/12/2001  . Macular degeneration of both eyes 01/24/2013   Severe visual impairment. Nearly blind.   . Mitral valve prolapse   . Occlusion and stenosis of carotid artery without mention of  cerebral infarction 12/27/2007  . Other and unspecified hyperlipidemia   . Other malaise and fatigue 12/06/2007  . Pacemaker-dual  medtronic 09/07/2011  . Personal history of fall 01/29/2009  . Rectal bleeding   . Senile osteoporosis 12/13/2000  . Sinus node dysfunction (HCC)   . Syncope and collapse 12/27/2007  . Trochanteric fracture of left femur (HCC) 03/14/2014   CT scan did however reveal acute fracture in greater left trochanteric. Dr. Margarita Rana consulted. Recommended nonoperative management and weightbearing as tolerated and another 3 weeks of Lovenox for DVT prophylaxis.       . Type 2 diabetes mellitus with blindness, with macular edema, with severe nonproliferative retinopathy 12/19/2007   Qualifier: Diagnosis of  By: Melvyn Neth CMA (AAMA), Patty  01/09/14 Hgb A1c 6.5 01/16/14 Hgb A1c 6.5 03/24/14 Hgb 6.4 12/10/14 pharm: dc Metformin and CBG monitoring-failure to thrive and refusing to eat.     Marland Kitchen Unspecified hereditary and idiopathic peripheral neuropathy 10/28/202012  . Unspecified hypothyroidism 03/18/2010  . Unspecified urinary incontinence 02/22/2005  . Urinary tract infection, site not specified 04/10/2014  . Xerophthalmus 01/21/2015   Past Surgical History:  Procedure Laterality Date  . BREAST BIOPSY Left 1970   benign  . Left hip surgery  09/2005  . PACEMAKER INSERTION  2006   Medtronic In-Pulse  . Right hip surgery  08/2003  . TONSILLECTOMY    . 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 Codeine     unknown  . Ibuprofen     unknown  . Lisinopril     unknown  . Penicillins     unknown  . Sanctura [Trospium Chloride]     unknown    Outpatient Encounter Medications as of 03/12/2018  Medication Sig  . acetaminophen (TYLENOL) 325 MG tablet Take 650 mg by mouth every 4 (four) hours as needed for mild pain or moderate pain.   . bisacodyl (DULCOLAX) 10 MG suppository Place 10 mg  rectally daily as needed for moderate constipation.   Marland Kitchen escitalopram (LEXAPRO) 10 MG tablet Take 10 mg by mouth daily.   . feeding supplement (BOOST / RESOURCE BREEZE) LIQD Take 1 Container by mouth 3 (three) times daily between meals.  Marland Kitchen LORazepam (ATIVAN) 0.5 MG tablet Take 0.5 mg by mouth daily. 12 PM  . LORazepam (ATIVAN) 1 MG tablet Take 1 mg by mouth daily. 4 PM  . magnesium hydroxide (MILK OF MAGNESIA) 400 MG/5ML suspension Take 30 mLs by mouth daily as needed for mild constipation.  Marland Kitchen morphine 20 MG/5ML solution Take by mouth every 2 (two) hours as needed for pain. Give 0.73ml, sublingual  . NON FORMULARY Take 4 drops by mouth every 2 (two) hours as needed. atropine  . sennosides-docusate sodium (SENOKOT-S) 8.6-50 MG tablet Take 2 tablets by mouth at bedtime. Reported on 12/11/2015   No facility-administered encounter medications on file as of 03/12/2018.    ROS was provided with assistance of staff Review of Systems  Constitutional: Positive for fatigue. Negative for activity change, appetite change,  chills, diaphoresis and fever.  HENT: Positive for hearing loss. Negative for congestion and voice change.   Eyes: Positive for visual disturbance.       Legally blind   Respiratory: Negative for cough, shortness of breath and wheezing.   Cardiovascular: Negative for chest pain, palpitations and leg swelling.  Gastrointestinal: Negative for abdominal distention, abdominal pain, constipation, diarrhea, nausea and vomiting.  Genitourinary: Negative for difficulty urinating, dysuria and urgency.       Incontinent of urine.   Musculoskeletal: Positive for gait problem.  Skin: Negative for color change and pallor.  Neurological: Negative for dizziness, speech difficulty, weakness and headaches.       Dementia  Psychiatric/Behavioral: Positive for agitation, behavioral problems and confusion. Negative for hallucinations and sleep disturbance. The patient is nervous/anxious.      Immunization History  Administered Date(s) Administered  . Influenza Whole 05/10/2017  . Influenza-Unspecified 06/06/2013, 04/21/2015, 05/17/2016  . PPD Test 07/23/2011  . Pneumococcal Polysaccharide-23 03/14/2009  . Td 03/14/2009  . Tdap 03/14/2009  . Zoster 06/29/2006, 06/29/2006   Pertinent  Health Maintenance Due  Topic Date Due  . PNA vac Low Risk Adult (2 of 2 - PCV13) 03/14/2010  . HEMOGLOBIN A1C  06/08/2017  . FOOT EXAM  12/05/2017  . URINE MICROALBUMIN  12/06/2017  . INFLUENZA VACCINE  03/01/2018  . DEXA SCAN  01/23/2019 (Originally 07/27/1982)  . OPHTHALMOLOGY EXAM  Discontinued   Fall Risk  03/30/2017 01/26/2015 03/06/2014  Falls in the past year? No Yes Yes  Number falls in past yr: - 1 1  Comment - - 03/06/14  Injury with Fall? - Yes Yes  Comment - - left hip   Risk Factor Category  - - High Fall Risk  Risk for fall due to : - History of fall(s);Impaired balance/gait;Impaired mobility -  Follow up - Falls evaluation completed -   Functional Status Survey:    Vitals:   03/12/18 1320  BP: 126/60  Pulse: 62  Resp: 20  Temp: (!) 96.6 F (35.9 C)  SpO2: 94%  Weight: 87 lb 3.2 oz (39.6 kg)  Height: 5\' 2"  (1.575 m)   Body mass index is 15.95 kg/m. Physical Exam  Constitutional: She appears well-developed and well-nourished.  HENT:  Head: Normocephalic and atraumatic.  Eyes: Pupils are equal, round, and reactive to light. EOM are normal.  Legally blind.   Neck: Normal range of motion. Neck supple. No JVD present. No thyromegaly present.  Cardiovascular: Normal rate and regular rhythm.  Murmur heard. Pulmonary/Chest: Effort normal and breath sounds normal.  Abdominal: Soft. Bowel sounds are normal. She exhibits no distension. There is no tenderness. There is no rebound.  Musculoskeletal: She exhibits no edema.  Needs assistance with transfer, non ambulatory.  Neurological: She is alert. No cranial nerve deficit. She exhibits normal muscle tone.  Coordination normal.  Oriented to self.   Skin: Skin is warm and dry.  Psychiatric:  Not conversed during my visit.     Labs reviewed: No results for input(s): NA, K, CL, CO2, GLUCOSE, BUN, CREATININE, CALCIUM, MG, PHOS in the last 8760 hours. No results for input(s): AST, ALT, ALKPHOS, BILITOT, PROT, ALBUMIN in the last 8760 hours. No results for input(s): WBC, NEUTROABS, HGB, HCT, MCV, PLT in the last 8760 hours. Lab Results  Component Value Date   TSH 3.55 01/27/2015   Lab Results  Component Value Date   HGBA1C 5.2 12/06/2016   Lab Results  Component Value Date   CHOL 140 12/20/2012  HDL 38 12/20/2012   LDLCALC 57 12/20/2012   TRIG 227 (A) 12/20/2012   CHOLHDL 5.0 12/22/2007    Significant Diagnostic Results in last 30 days:  No results found.  Assessment/Plan Alzheimer's dementia with behavioral disturbance Advanced stage, total dependent of ADL, goal of care is comfort measures, continue Hospice service, continue Morphine prn.   Constipation Stable, continue Senokot S II qhs, prn Bisacodyl suppository, prn MOM  FTT (failure to thrive) in adult Persists, continue supportive measures.   Depression with anxiety Continue to stabilize her mood with Escitalopram 10mg  qd, Lorazepam 1mg  qd, 0.5mg  qd.      Family/ staff Communication: plan of care reviewed with the patient and charge nurse.   Labs/tests ordered:  none  Time spend 25 minutes.

## 2018-03-12 NOTE — Assessment & Plan Note (Signed)
Continue to stabilize her mood with Escitalopram 10mg  qd, Lorazepam 1mg  qd, 0.5mg  qd.

## 2018-03-12 NOTE — Assessment & Plan Note (Signed)
Persists, continue supportive measures.  

## 2018-03-12 NOTE — Assessment & Plan Note (Signed)
Stable, continue Senokot S II qhs, prn Bisacodyl suppository, prn MOM

## 2018-03-12 NOTE — Assessment & Plan Note (Signed)
Advanced stage, total dependent of ADL, goal of care is comfort measures, continue Hospice service, continue Morphine prn.

## 2018-03-14 ENCOUNTER — Encounter: Payer: Self-pay | Admitting: Internal Medicine

## 2018-03-14 DIAGNOSIS — I495 Sick sinus syndrome: Secondary | ICD-10-CM | POA: Diagnosis not present

## 2018-03-14 DIAGNOSIS — E46 Unspecified protein-calorie malnutrition: Secondary | ICD-10-CM | POA: Diagnosis not present

## 2018-03-14 DIAGNOSIS — R630 Anorexia: Secondary | ICD-10-CM | POA: Diagnosis not present

## 2018-03-14 DIAGNOSIS — R634 Abnormal weight loss: Secondary | ICD-10-CM | POA: Diagnosis not present

## 2018-03-14 DIAGNOSIS — I4891 Unspecified atrial fibrillation: Secondary | ICD-10-CM | POA: Diagnosis not present

## 2018-03-14 DIAGNOSIS — F0151 Vascular dementia with behavioral disturbance: Secondary | ICD-10-CM | POA: Diagnosis not present

## 2018-03-19 DIAGNOSIS — I495 Sick sinus syndrome: Secondary | ICD-10-CM | POA: Diagnosis not present

## 2018-03-19 DIAGNOSIS — E46 Unspecified protein-calorie malnutrition: Secondary | ICD-10-CM | POA: Diagnosis not present

## 2018-03-19 DIAGNOSIS — I4891 Unspecified atrial fibrillation: Secondary | ICD-10-CM | POA: Diagnosis not present

## 2018-03-19 DIAGNOSIS — R634 Abnormal weight loss: Secondary | ICD-10-CM | POA: Diagnosis not present

## 2018-03-19 DIAGNOSIS — F0151 Vascular dementia with behavioral disturbance: Secondary | ICD-10-CM | POA: Diagnosis not present

## 2018-03-19 DIAGNOSIS — R630 Anorexia: Secondary | ICD-10-CM | POA: Diagnosis not present

## 2018-03-21 DIAGNOSIS — R630 Anorexia: Secondary | ICD-10-CM | POA: Diagnosis not present

## 2018-03-21 DIAGNOSIS — R634 Abnormal weight loss: Secondary | ICD-10-CM | POA: Diagnosis not present

## 2018-03-21 DIAGNOSIS — F0151 Vascular dementia with behavioral disturbance: Secondary | ICD-10-CM | POA: Diagnosis not present

## 2018-03-21 DIAGNOSIS — I4891 Unspecified atrial fibrillation: Secondary | ICD-10-CM | POA: Diagnosis not present

## 2018-03-21 DIAGNOSIS — E46 Unspecified protein-calorie malnutrition: Secondary | ICD-10-CM | POA: Diagnosis not present

## 2018-03-21 DIAGNOSIS — I495 Sick sinus syndrome: Secondary | ICD-10-CM | POA: Diagnosis not present

## 2018-03-22 DIAGNOSIS — F0151 Vascular dementia with behavioral disturbance: Secondary | ICD-10-CM | POA: Diagnosis not present

## 2018-03-22 DIAGNOSIS — I495 Sick sinus syndrome: Secondary | ICD-10-CM | POA: Diagnosis not present

## 2018-03-22 DIAGNOSIS — R634 Abnormal weight loss: Secondary | ICD-10-CM | POA: Diagnosis not present

## 2018-03-22 DIAGNOSIS — I4891 Unspecified atrial fibrillation: Secondary | ICD-10-CM | POA: Diagnosis not present

## 2018-03-22 DIAGNOSIS — R630 Anorexia: Secondary | ICD-10-CM | POA: Diagnosis not present

## 2018-03-22 DIAGNOSIS — E46 Unspecified protein-calorie malnutrition: Secondary | ICD-10-CM | POA: Diagnosis not present

## 2018-03-25 DIAGNOSIS — R630 Anorexia: Secondary | ICD-10-CM | POA: Diagnosis not present

## 2018-03-25 DIAGNOSIS — R634 Abnormal weight loss: Secondary | ICD-10-CM | POA: Diagnosis not present

## 2018-03-25 DIAGNOSIS — E46 Unspecified protein-calorie malnutrition: Secondary | ICD-10-CM | POA: Diagnosis not present

## 2018-03-25 DIAGNOSIS — F0151 Vascular dementia with behavioral disturbance: Secondary | ICD-10-CM | POA: Diagnosis not present

## 2018-03-25 DIAGNOSIS — I4891 Unspecified atrial fibrillation: Secondary | ICD-10-CM | POA: Diagnosis not present

## 2018-03-25 DIAGNOSIS — I495 Sick sinus syndrome: Secondary | ICD-10-CM | POA: Diagnosis not present

## 2018-03-27 DIAGNOSIS — R634 Abnormal weight loss: Secondary | ICD-10-CM | POA: Diagnosis not present

## 2018-03-27 DIAGNOSIS — I4891 Unspecified atrial fibrillation: Secondary | ICD-10-CM | POA: Diagnosis not present

## 2018-03-27 DIAGNOSIS — F0151 Vascular dementia with behavioral disturbance: Secondary | ICD-10-CM | POA: Diagnosis not present

## 2018-03-27 DIAGNOSIS — R630 Anorexia: Secondary | ICD-10-CM | POA: Diagnosis not present

## 2018-03-27 DIAGNOSIS — E46 Unspecified protein-calorie malnutrition: Secondary | ICD-10-CM | POA: Diagnosis not present

## 2018-03-27 DIAGNOSIS — I495 Sick sinus syndrome: Secondary | ICD-10-CM | POA: Diagnosis not present

## 2018-03-30 DIAGNOSIS — R630 Anorexia: Secondary | ICD-10-CM | POA: Diagnosis not present

## 2018-03-30 DIAGNOSIS — F0151 Vascular dementia with behavioral disturbance: Secondary | ICD-10-CM | POA: Diagnosis not present

## 2018-03-30 DIAGNOSIS — I495 Sick sinus syndrome: Secondary | ICD-10-CM | POA: Diagnosis not present

## 2018-03-30 DIAGNOSIS — R634 Abnormal weight loss: Secondary | ICD-10-CM | POA: Diagnosis not present

## 2018-03-30 DIAGNOSIS — E46 Unspecified protein-calorie malnutrition: Secondary | ICD-10-CM | POA: Diagnosis not present

## 2018-03-30 DIAGNOSIS — I4891 Unspecified atrial fibrillation: Secondary | ICD-10-CM | POA: Diagnosis not present

## 2018-04-01 DIAGNOSIS — H3530 Unspecified macular degeneration: Secondary | ICD-10-CM | POA: Diagnosis not present

## 2018-04-01 DIAGNOSIS — M84359S Stress fracture, hip, unspecified, sequela: Secondary | ICD-10-CM | POA: Diagnosis not present

## 2018-04-01 DIAGNOSIS — I10 Essential (primary) hypertension: Secondary | ICD-10-CM | POA: Diagnosis not present

## 2018-04-01 DIAGNOSIS — E785 Hyperlipidemia, unspecified: Secondary | ICD-10-CM | POA: Diagnosis not present

## 2018-04-01 DIAGNOSIS — E039 Hypothyroidism, unspecified: Secondary | ICD-10-CM | POA: Diagnosis not present

## 2018-04-01 DIAGNOSIS — R634 Abnormal weight loss: Secondary | ICD-10-CM | POA: Diagnosis not present

## 2018-04-01 DIAGNOSIS — E1159 Type 2 diabetes mellitus with other circulatory complications: Secondary | ICD-10-CM | POA: Diagnosis not present

## 2018-04-01 DIAGNOSIS — I4891 Unspecified atrial fibrillation: Secondary | ICD-10-CM | POA: Diagnosis not present

## 2018-04-01 DIAGNOSIS — F0151 Vascular dementia with behavioral disturbance: Secondary | ICD-10-CM | POA: Diagnosis not present

## 2018-04-01 DIAGNOSIS — E46 Unspecified protein-calorie malnutrition: Secondary | ICD-10-CM | POA: Diagnosis not present

## 2018-04-01 DIAGNOSIS — F339 Major depressive disorder, recurrent, unspecified: Secondary | ICD-10-CM | POA: Diagnosis not present

## 2018-04-01 DIAGNOSIS — R630 Anorexia: Secondary | ICD-10-CM | POA: Diagnosis not present

## 2018-04-01 DIAGNOSIS — I495 Sick sinus syndrome: Secondary | ICD-10-CM | POA: Diagnosis not present

## 2018-04-03 ENCOUNTER — Non-Acute Institutional Stay (SKILLED_NURSING_FACILITY): Payer: Medicare Other

## 2018-04-03 DIAGNOSIS — Z0000 Encounter for general adult medical examination without abnormal findings: Secondary | ICD-10-CM

## 2018-04-03 NOTE — Patient Instructions (Signed)
[name] , Thank you for taking time to come for your Medicare Wellness Visit.  Screening recommendations/referrals: Colonoscopy excluded, over age 82 Mammogram excluded, over age 25 Bone Density exlcuded Recommended yearly ophthalmology/optometry visit for glaucoma screening and checkup Recommended yearly dental visit for hygiene and checkup  Vaccinations: Influenza vaccine excluded, hospice Pneumococcal vaccine due, not ordered on hospice Tdap vaccine up to date Shingles vaccine not in past records    Advanced directives: in chart  Conditions/risks identified: none  Next appointment: Dr. Glade Lloyd makes rounds   Preventive Care 65 Years and Older, Female Preventive care refers to lifestyle choices and visits with your health care provider that can promote health and wellness. What does preventive care include?  A yearly physical exam. This is also called an annual well check.  Dental exams once or twice a year.  Routine eye exams. Ask your health care provider how often you should have your eyes checked.  Personal lifestyle choices, including:  Daily care of your teeth and gums.  Regular physical activity.  Eating a healthy diet.  Avoiding tobacco and drug use.  Limiting alcohol use.  Practicing safe sex.  Taking low-dose aspirin every day.  Taking vitamin and mineral supplements as recommended by your health care provider. What happens during an annual well check? The services and screenings done by your health care provider during your annual well check will depend on your age, overall health, lifestyle risk factors, and family history of disease. Counseling  Your health care provider may ask you questions about your:  Alcohol use.  Tobacco use.  Drug use.  Emotional well-being.  Home and relationship well-being.  Sexual activity.  Eating habits.  History of falls.  Memory and ability to understand (cognition).  Work and work  Astronomer.  Reproductive health. Screening  You may have the following tests or measurements:  Height, weight, and BMI.  Blood pressure.  Lipid and cholesterol levels. These may be checked every 5 years, or more frequently if you are over 89 years old.  Skin check.  Lung cancer screening. You may have this screening every year starting at age 13 if you have a 30-pack-year history of smoking and currently smoke or have quit within the past 15 years.  Fecal occult blood test (FOBT) of the stool. You may have this test every year starting at age 57.  Flexible sigmoidoscopy or colonoscopy. You may have a sigmoidoscopy every 5 years or a colonoscopy every 10 years starting at age 74.  Hepatitis C blood test.  Hepatitis B blood test.  Sexually transmitted disease (STD) testing.  Diabetes screening. This is done by checking your blood sugar (glucose) after you have not eaten for a while (fasting). You may have this done every 1-3 years.  Bone density scan. This is done to screen for osteoporosis. You may have this done starting at age 40.  Mammogram. This may be done every 1-2 years. Talk to your health care provider about how often you should have regular mammograms. Talk with your health care provider about your test results, treatment options, and if necessary, the need for more tests. Vaccines  Your health care provider may recommend certain vaccines, such as:  Influenza vaccine. This is recommended every year.  Tetanus, diphtheria, and acellular pertussis (Tdap, Td) vaccine. You may need a Td booster every 10 years.  Zoster vaccine. You may need this after age 60.  Pneumococcal 13-valent conjugate (PCV13) vaccine. One dose is recommended after age 67.  Pneumococcal polysaccharide (  PPSV23) vaccine. One dose is recommended after age 45. Talk to your health care provider about which screenings and vaccines you need and how often you need them. This information is not  intended to replace advice given to you by your health care provider. Make sure you discuss any questions you have with your health care provider. Document Released: 08/14/2015 Document Revised: 04/06/2016 Document Reviewed: 05/19/2015 Elsevier Interactive Patient Education  2017 [employer].  Fall Prevention in the Home Falls can cause injuries. They can happen to people of all ages. There are many things you can do to make your home safe and to help prevent falls. What can I do on the outside of my home?  Regularly fix the edges of walkways and driveways and fix any cracks.  Remove anything that might make you trip as you walk through a door, such as a raised step or threshold.  Trim any bushes or trees on the path to your home.  Use bright outdoor lighting.  Clear any walking paths of anything that might make someone trip, such as rocks or tools.  Regularly check to see if handrails are loose or broken. Make sure that both sides of any steps have handrails.  Any raised decks and porches should have guardrails on the edges.  Have any leaves, snow, or ice cleared regularly.  Use sand or salt on walking paths during winter.  Clean up any spills in your garage right away. This includes oil or grease spills. What can I do in the bathroom?  Use night lights.  Install grab bars by the toilet and in the tub and shower. Do not use towel bars as grab bars.  Use non-skid mats or decals in the tub or shower.  If you need to sit down in the shower, use a plastic, non-slip stool.  Keep the floor dry. Clean up any water that spills on the floor as soon as it happens.  Remove soap buildup in the tub or shower regularly.  Attach bath mats securely with double-sided non-slip rug tape.  Do not have throw rugs and other things on the floor that can make you trip. What can I do in the bedroom?  Use night lights.  Make sure that you have a light by your bed that is easy to reach.  Do  not use any sheets or blankets that are too big for your bed. They should not hang down onto the floor.  Have a firm chair that has side arms. You can use this for support while you get dressed.  Do not have throw rugs and other things on the floor that can make you trip. What can I do in the kitchen?  Clean up any spills right away.  Avoid walking on wet floors.  Keep items that you use a lot in easy-to-reach places.  If you need to reach something above you, use a strong step stool that has a grab bar.  Keep electrical cords out of the way.  Do not use floor polish or wax that makes floors slippery. If you must use wax, use non-skid floor wax.  Do not have throw rugs and other things on the floor that can make you trip. What can I do with my stairs?  Do not leave any items on the stairs.  Make sure that there are handrails on both sides of the stairs and use them. Fix handrails that are broken or loose. Make sure that handrails are as long as  the stairways.  Check any carpeting to make sure that it is firmly attached to the stairs. Fix any carpet that is loose or worn.  Avoid having throw rugs at the top or bottom of the stairs. If you do have throw rugs, attach them to the floor with carpet tape.  Make sure that you have a light switch at the top of the stairs and the bottom of the stairs. If you do not have them, ask someone to add them for you. What else can I do to help prevent falls?  Wear shoes that:  Do not have high heels.  Have rubber bottoms.  Are comfortable and fit you well.  Are closed at the toe. Do not wear sandals.  If you use a stepladder:  Make sure that it is fully opened. Do not climb a closed stepladder.  Make sure that both sides of the stepladder are locked into place.  Ask someone to hold it for you, if possible.  Clearly mark and make sure that you can see:  Any grab bars or handrails.  First and last steps.  Where the edge of each  step is.  Use tools that help you move around (mobility aids) if they are needed. These include:  Canes.  Walkers.  Scooters.  Crutches.  Turn on the lights when you go into a dark area. Replace any light bulbs as soon as they burn out.  Set up your furniture so you have a clear path. Avoid moving your furniture around.  If any of your floors are uneven, fix them.  If there are any pets around you, be aware of where they are.  Review your medicines with your doctor. Some medicines can make you feel dizzy. This can increase your chance of falling. Ask your doctor what other things that you can do to help prevent falls. This information is not intended to replace advice given to you by your health care provider. Make sure you discuss any questions you have with your health care provider. Document Released: 05/14/2009 Document Revised: 12/24/2015 Document Reviewed: 08/22/2014 Elsevier Interactive Patient Education  2017 [employer].

## 2018-04-03 NOTE — Progress Notes (Signed)
Subjective:   [name]. Kinnick is a 84 y.o. female who presents for Medicare Annual (Subsequent) preventive examination at [facility]; incapacitated patient unable to answer questions appropriately, hospice patient  Last AWV-03/30/2017    Objective:     Vitals: BP 122/63 (BP Location: Left Arm, Patient Position: Supine)   Pulse 63   Temp (!) 97.5 F (36.4 C) (Oral)   Ht 5\' 2"  (1.575 m)   Wt 87 lb 3.2 oz (39.6 kg)   BMI 15.95 kg/m   Body mass index is 15.95 kg/m.  Advanced Directives 04/03/2018 03/12/2018 02/09/2018 12/15/2017 11/16/2017 10/20/2017 11-05-2017  Does Patient Have a Medical Advance Directive? Yes Yes Yes Yes Yes Yes Yes  Type of Advance Directive Living will;Out of facility DNR (pink MOST or yellow form) Living will;Out of facility DNR (pink MOST or yellow form) Living will;Out of facility DNR (pink MOST or yellow form) Out of facility DNR (pink MOST or yellow form);Living will Living will;Out of facility DNR (pink MOST or yellow form) Living will;Out of facility DNR (pink MOST or yellow form) Living will;Out of facility DNR (pink MOST or yellow form)  Does patient want to make changes to medical advance directive? No - Patient declined No - Patient declined No - Patient declined No - Patient declined No - Patient declined No - Patient declined No - Patient declined  Copy of Healthcare Power of Attorney in Chart? - - - - - - -  Pre-existing out of facility DNR order (yellow form or pink MOST form) Yellow form placed in chart (order not valid for inpatient use);Pink MOST form placed in chart (order not valid for inpatient use) Yellow form placed in chart (order not valid for inpatient use);Pink MOST form placed in chart (order not valid for inpatient use) Yellow form placed in chart (order not valid for inpatient use);Pink MOST form placed in chart (order not valid for inpatient use) Yellow form placed in chart (order not valid for inpatient use);Pink MOST form  placed in chart (order not valid for inpatient use) Yellow form placed in chart (order not valid for inpatient use);Pink MOST form placed in chart (order not valid for inpatient use) - Yellow form placed in chart (order not valid for inpatient use);Pink MOST form placed in chart (order not valid for inpatient use)    Tobacco Social History   Tobacco Use  Smoking Status Never Smoker  Smokeless Tobacco Never Used     Counseling given: Not Answered   Clinical Intake:  Pre-visit preparation completed: No  Pain : Faces Faces Pain Scale: No hurt  Faces Pain Scale: No hurt  Nutritional Risks: None Diabetes: Yes CBG done?: No Did pt. bring in CBG monitor from home?: No  How often do you need to have someone help you when you read instructions, pamphlets, or other written materials from your doctor or pharmacy?: 5 - Always  Interpreter Needed?: No  Information entered by :: Tyron Russell, RN  Past Medical History:  Diagnosis Date  . Abnormality of gait 09/08/2003  . AF (atrial fibrillation) (HCC)   . Anxiety state, unspecified 08/19/2010  . Aortic stenosis   . Atrial fibrillation (HCC) 07/14/2009   Qualifier: Diagnosis of  By: [name], MD, Ruthann Cancer Ty Hilts   . Bradycardia   . Cardiac pacemaker in situ 09/12/2004  . Closed fracture of lumbar vertebra without mention of spinal cord injury 104-01-2009  . Closed fracture of unspecified trochanteric section of femur 05/29/2011  . Constipation  03/14/2014  . Dementia 2013   04/03/14 MMSE 22/30   . Depression 04/10/2014  . Diffuse cystic mastopathy 12/23/2009  . Disturbance of salivary secretion [id number]  . Edema 08/18/2003  . Fall 12/19/2012   07/22/14 fall VS 144/76, 60, 20, 97.1. No apparent injury   . Flatulence, eructation, and gas pain 12/03/2009  . Fracture of greater trochanter of left femur (HCC) 03/08/14  . FTT (failure to thrive) in adult 12/15/2014  . Hearing loss   . Hemorrhoids   . Hip fracture (HCC)   . HTN (hypertension)     . Hypothyroidism 12/19/2007   Qualifier: Diagnosis of  By: Melvyn Neth CMA (AAMA), Patty  01/16/14 TSH 2.474 03/24/14 TSH 2.576    . 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 Loss of weight 10/13/2014   Multiple factorials: dementia, depression, advanced age, possible AR of medications(metformin and Oxybutynin)-will increase Mirtazapine and continue supplement. Observe.    . Macular degeneration (senile) of retina, unspecified 12/12/2001  . Macular degeneration of both eyes 01/24/2013   Severe visual impairment. Nearly blind.   . Mitral valve prolapse   . Occlusion and stenosis of carotid artery without mention of cerebral infarction 12/27/2007  . Other and unspecified hyperlipidemia   . Other malaise and fatigue 12/06/2007  . Pacemaker-dual  medtronic 09/07/2011  . Personal history of fall 01/29/2009  . Rectal bleeding   . Senile osteoporosis 12/13/2000  . Sinus node dysfunction (HCC)   . Syncope and collapse 12/27/2007  . Trochanteric fracture of left femur (HCC) 03/14/2014   CT scan did however reveal acute fracture in greater left trochanteric. Dr. Margarita Rana consulted. Recommended nonoperative management and weightbearing as tolerated and another 3 weeks of Lovenox for DVT prophylaxis.       . Type 2 diabetes mellitus with blindness, with macular edema, with severe nonproliferative retinopathy 12/19/2007   Qualifier: Diagnosis of  By: Melvyn Neth CMA (AAMA), Patty  01/09/14 Hgb A1c 6.5 01/16/14 Hgb A1c 6.5 03/24/14 Hgb 6.4 12/10/14 pharm: dc Metformin and CBG monitoring-failure to thrive and refusing to eat.     Marland Kitchen Unspecified hereditary and idiopathic peripheral neuropathy 10/28/202012  . Unspecified hypothyroidism 03/18/2010  . Unspecified urinary incontinence 02/22/2005  . Urinary tract infection, site not specified 04/10/2014  . Xerophthalmus 01/21/2015   Past Surgical History:  Procedure Laterality Date  . BREAST BIOPSY Left 1970   benign  . Left hip surgery   09/2005  . PACEMAKER INSERTION  2006   Medtronic In-Pulse  . Right hip surgery  08/2003  . TONSILLECTOMY    . 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 Colon cancer Mother   . Cancer Mother        colon  . Heart attack Father   . Heart disease Father        MI  . Heart disease Sister        MI   Social History   Socioeconomic History  . Marital status: Widowed    Spouse name: Not on file  . Number of children: Not on file  . Years of education: Not on file  . Highest education level: Not on file  Occupational History  . Not on file  Social Needs  . Financial resource strain: Not on file  . Food insecurity:    Worry: Not on file  Inability: Not on file  . Transportation needs:    Medical: Not on file    Non-medical: Not on file  Tobacco Use  . Smoking status: Never Smoker  . Smokeless tobacco: Never Used  Substance and Sexual Activity  . Alcohol use: No  . Drug use: No  . Sexual activity: Never  Lifestyle  . Physical activity:    Days per week: Not on file    Minutes per session: Not on file  . Stress: Not on file  Relationships  . Social connections:    Talks on phone: Not on file    Gets together: Not on file    Attends religious service: Not on file    Active member of club or organization: Not on file    Attends meetings of clubs or organizations: Not on file    Relationship status: Not on file  Other Topics Concern  . Not on file  Social History Narrative   Lives at [facility] since 2010 [place], transferred to memory care unit 04/29/14   Widowed   Never smoked   Alcohol none   Exercise none   DNR/ Living Will, MOST, POA      Admitted to [facility] 08/05/2015    Outpatient Encounter Medications as of 04/03/2018  Medication Sig  . acetaminophen (TYLENOL) 325 MG tablet Take 650 mg by mouth every 4 (four) hours as needed for mild  pain or moderate pain.   . bisacodyl (DULCOLAX) 10 MG suppository Place 10 mg rectally daily as needed for moderate constipation.   Marland Kitchen escitalopram (LEXAPRO) 10 MG tablet Take 10 mg by mouth daily.   . feeding supplement (BOOST / RESOURCE BREEZE) LIQD Take 1 Container by mouth 3 (three) times daily between meals.  Marland Kitchen LORazepam (ATIVAN) 0.5 MG tablet Take 0.5 mg by mouth daily. 12 PM  . LORazepam (ATIVAN) 1 MG tablet Take 1 mg by mouth daily. 4 PM  . magnesium hydroxide (MILK OF MAGNESIA) 400 MG/5ML suspension Take 30 mLs by mouth daily as needed for mild constipation.  Marland Kitchen morphine 20 MG/5ML solution Take by mouth every 2 (two) hours as needed for pain. Give 0.13ml, sublingual  . NON FORMULARY Take 4 drops by mouth every 2 (two) hours as needed. atropine  . sennosides-docusate sodium (SENOKOT-S) 8.6-50 MG tablet Take 2 tablets by mouth at bedtime. Reported on 12/11/2015   No facility-administered encounter medications on file as of 04/03/2018.     Activities of Daily Living In your present state of health, do you have any difficulty performing the following activities: 04/03/2018  Hearing? Y  Vision? Y  Difficulty concentrating or making decisions? Y  Walking or climbing stairs? Y  Dressing or bathing? Y  Doing errands, shopping? Y  Preparing Food and eating ? Y  Using the Toilet? Y  In the past six months, have you accidently leaked urine? Y  Do you have problems with loss of bowel control? Y  Managing your Medications? Y  Managing your Finances? Y  Housekeeping or managing your Housekeeping? Y  Some recent data might be hidden    Patient Care Team: Oneal Grout, MD as PCP - General (Internal Medicine) Mast, Man X, NP as Nurse Practitioner (Nurse Practitioner) [place], [facility] [place], [place] Lowella Bandy., MD as Consulting Physician (Urology) Rachael Fee, MD as Consulting Physician (Gastroenterology) Salvatore Marvel, MD as Consulting Physician (Orthopedic Surgery) Venancio Poisson,  MD as Consulting Physician (Dermatology) Sheral Apley, MD as Attending Physician (Orthopedic Surgery)  Assessment:   This is a routine wellness examination for [name].  Exercise Activities and Dietary recommendations Current Exercise Habits: The patient does not participate in regular exercise at present, Exercise limited by: orthopedic condition(s);neurologic condition(s)  Goals   None     Fall Risk Fall Risk  04/03/2018 03/30/2017 01/26/2015 03/06/2014  Falls in the past year? No No Yes Yes  Number falls in past yr: - - 1 1  Comment - - - 03/06/14  Injury with Fall? - - Yes Yes  Comment - - - left hip   Risk Factor Category  - - - High Fall Risk  Risk for fall due to : - - History of fall(s);Impaired balance/gait;Impaired mobility -  Follow up - - Falls evaluation completed -   Is the patient's home free of loose throw rugs in walkways, pet beds, electrical cords, etc?   yes      Grab bars in the bathroom? yes      Handrails on the stairs?   yes      Adequate lighting?   yes  Depression Screen PHQ 2/9 Scores 04/03/2018 03/30/2017 01/26/2015 03/06/2014  PHQ - 2 Score - - 2 3  PHQ- 9 Score - - 6 -  Exception Documentation Medical reason Medical reason - -     Cognitive Function MMSE - Mini Mental State Exam 04/03/2018 03/30/2017  Not completed: Unable to complete Unable to complete        Immunization History  Administered Date(s) Administered  . Influenza Whole 05/10/2017  . Influenza-Unspecified 06/06/2013, 04/21/2015, 05/17/2016  . PPD Test 07/23/2011  . Pneumococcal Polysaccharide-23 03/14/2009  . Td 03/14/2009  . Tdap 03/14/2009  . Zoster 06/29/2006, 06/29/2006    Qualifies for Shingles Vaccine? Not in past records  Screening Tests Health Maintenance  Topic Date Due  . PNA vac Low Risk Adult (2 of 2 - PCV13) 03/14/2010  . HEMOGLOBIN A1C  06/08/2017  . FOOT EXAM  12/05/2017  . URINE MICROALBUMIN  12/06/2017  . INFLUENZA VACCINE  03/01/2018  . DEXA SCAN   01/23/2019 (Originally 07/27/1982)  . TETANUS/TDAP  03/15/2019  . OPHTHALMOLOGY EXAM  Discontinued    Cancer Screenings: Lung: Low Dose CT Chest recommended if Age 65-80 years, 30 pack-year currently smoking OR have quit w/in 15years. Patient does not qualify. Breast:  Up to date on Mammogram? Yes   Up to date of Bone Density/Dexa? excluded Colorectal: up to date  Additional Screenings:  Hepatitis C Screening: unable to accept or decline appropriately  Flu vaccine due: not ordered, patient is hospice Prevnar due: not ordered, patient is hospice    Plan:    I have personally reviewed and addressed the Medicare Annual Wellness questionnaire and have noted the following in the patient's chart:  A. Medical and social history B. Use of alcohol, tobacco or illicit drugs  C. Current medications and supplements D. Functional ability and status E.  Nutritional status F.  Physical activity G. Advance directives H. List of other physicians I.  Hospitalizations, surgeries, and ER visits in previous 12 months J.  Vitals K. Screenings to include hearing, vision, cognitive, depression L. Referrals and appointments - none  In addition, I am unable to review and discuss with incapacitated patient certain preventive protocols, quality metrics, and best practice recommendations. A written personalized care plan for preventive services as well as general preventive health recommendations were provided to patient.   See attached scanned questionnaire for additional information.   Signed,   Tyron Russell, RN  Nurse Health Advisor

## 2018-04-04 DIAGNOSIS — E46 Unspecified protein-calorie malnutrition: Secondary | ICD-10-CM | POA: Diagnosis not present

## 2018-04-04 DIAGNOSIS — I4891 Unspecified atrial fibrillation: Secondary | ICD-10-CM | POA: Diagnosis not present

## 2018-04-04 DIAGNOSIS — R630 Anorexia: Secondary | ICD-10-CM | POA: Diagnosis not present

## 2018-04-04 DIAGNOSIS — F0151 Vascular dementia with behavioral disturbance: Secondary | ICD-10-CM | POA: Diagnosis not present

## 2018-04-04 DIAGNOSIS — R634 Abnormal weight loss: Secondary | ICD-10-CM | POA: Diagnosis not present

## 2018-04-04 DIAGNOSIS — I495 Sick sinus syndrome: Secondary | ICD-10-CM | POA: Diagnosis not present

## 2018-04-05 DIAGNOSIS — F0151 Vascular dementia with behavioral disturbance: Secondary | ICD-10-CM | POA: Diagnosis not present

## 2018-04-05 DIAGNOSIS — I495 Sick sinus syndrome: Secondary | ICD-10-CM | POA: Diagnosis not present

## 2018-04-05 DIAGNOSIS — R630 Anorexia: Secondary | ICD-10-CM | POA: Diagnosis not present

## 2018-04-05 DIAGNOSIS — E46 Unspecified protein-calorie malnutrition: Secondary | ICD-10-CM | POA: Diagnosis not present

## 2018-04-05 DIAGNOSIS — R634 Abnormal weight loss: Secondary | ICD-10-CM | POA: Diagnosis not present

## 2018-04-05 DIAGNOSIS — I4891 Unspecified atrial fibrillation: Secondary | ICD-10-CM | POA: Diagnosis not present

## 2018-04-09 DIAGNOSIS — R634 Abnormal weight loss: Secondary | ICD-10-CM | POA: Diagnosis not present

## 2018-04-09 DIAGNOSIS — E46 Unspecified protein-calorie malnutrition: Secondary | ICD-10-CM | POA: Diagnosis not present

## 2018-04-09 DIAGNOSIS — F0151 Vascular dementia with behavioral disturbance: Secondary | ICD-10-CM | POA: Diagnosis not present

## 2018-04-09 DIAGNOSIS — I4891 Unspecified atrial fibrillation: Secondary | ICD-10-CM | POA: Diagnosis not present

## 2018-04-09 DIAGNOSIS — I495 Sick sinus syndrome: Secondary | ICD-10-CM | POA: Diagnosis not present

## 2018-04-09 DIAGNOSIS — R630 Anorexia: Secondary | ICD-10-CM | POA: Diagnosis not present

## 2018-04-10 ENCOUNTER — Encounter: Payer: Self-pay | Admitting: Nurse Practitioner

## 2018-04-10 ENCOUNTER — Non-Acute Institutional Stay (SKILLED_NURSING_FACILITY): Payer: Medicare Other | Admitting: Nurse Practitioner

## 2018-04-10 DIAGNOSIS — G301 Alzheimer's disease with late onset: Secondary | ICD-10-CM

## 2018-04-10 DIAGNOSIS — F418 Other specified anxiety disorders: Secondary | ICD-10-CM | POA: Diagnosis not present

## 2018-04-10 DIAGNOSIS — K5901 Slow transit constipation: Secondary | ICD-10-CM

## 2018-04-10 DIAGNOSIS — F0281 Dementia in other diseases classified elsewhere with behavioral disturbance: Secondary | ICD-10-CM

## 2018-04-10 MED ORDER — ATROPINE SULFATE 1 % OP SOLN: 4.0000 [drp] | OPHTHALMIC | Status: DC | PRN

## 2018-04-10 NOTE — Assessment & Plan Note (Signed)
dementia, failure to thrive, total care of ADLs, under Hospice service. She has prn Atropine ophthalmic solution, 4gtt SL q2h prn, Tylenol 650mg  q4h prn, Morphine 5mg  q2h prn, Ativan 0.5mg  bid for comfort measures.

## 2018-04-10 NOTE — Assessment & Plan Note (Signed)
Constipation is managed, continue MOM 58ml qd, prn Dulcolax supp qd prn, Senokot S II qhs.

## 2018-04-10 NOTE — Progress Notes (Signed)
Location:   [facility] Room Number: 30 Place of Service:    Provider: [facility] Valyn Latchford NP  Oneal Grout, MD  Patient Care Team: Oneal Grout, MD as PCP - General (Internal Medicine) Johnell Bas X, NP as Nurse Practitioner (Nurse Practitioner) [place] [place], [place] Lowella Bandy., MD as Consulting Physician (Urology) Rachael Fee, MD as Consulting Physician (Gastroenterology) Salvatore Marvel, MD as Consulting Physician (Orthopedic Surgery) Venancio Poisson, MD as Consulting Physician (Dermatology) Sheral Apley, MD as Attending Physician (Orthopedic Surgery)  Extended Emergency Contact Information Primary Emergency Contact: Cook,[name] Address: [address]          [place], [place] [address] [place] Home Phone: [phone] Work Phone: [phone] Mobile Phone: [phone] Relation: Son Secondary Emergency Contact: Groom,[name] Address: [address]          [place], [place] [address] [place] Home Phone: [phone] Relation: Relative  Code Status:  DNR Goals of care: Advanced Directive information Advanced Directives 04/03/2018  Does Patient Have a Medical Advance Directive? Yes  Type of Advance Directive Living will;Out of facility DNR (pink MOST or yellow form)  Does patient want to make changes to medical advance directive? No - Patient declined  Copy of Healthcare Power of Attorney in Chart? -  Pre-existing out of facility DNR order (yellow form or pink MOST form) Yellow form placed in chart (order not valid for inpatient use);Pink MOST form placed in chart (order not valid for inpatient use)     Chief Complaint  Patient presents with  . Medical Management of Chronic Issues    HPI:  Pt is a 84 y.o. female seen today for medical management of chronic diseases.     The patient has history of dementia, failure to thrive, total care of ADLs, under Hospice service. She has prn Atropine ophthalmic solution, 4gtt SL q2h prn,  Tylenol 650mg  q4h prn, Morphine 5mg  q2h prn, Ativan 0.5mg  bid for comfort measures. Her mood is stable, on Lexaprol 10mg  qd. Constipation is managed with MOM 11ml qd, prn Dulcolax supp qd prn, Senokot S II qhs.  Past Medical History:  Diagnosis Date  . Abnormality of gait 09/08/2003  . AF (atrial fibrillation) (HCC)   . Anxiety state, unspecified 08/19/2010  . Aortic stenosis   . Atrial fibrillation (HCC) 07/14/2009   Qualifier: Diagnosis of  By: [name], MD, Ruthann Cancer Ty Hilts   . Bradycardia   . Cardiac pacemaker in situ 09/12/2004  . Closed fracture of lumbar vertebra without mention of spinal cord injury [id number]  . Closed fracture of unspecified trochanteric section of femur 05/29/2011  . Constipation 03/14/2014  . Dementia 2013   04/03/14 MMSE 22/30   . Depression 04/10/2014  . Diffuse cystic mastopathy 12/23/2009  . Disturbance of salivary secretion [id number]  . Edema 08/18/2003  . Fall 12/19/2012   07/22/14 fall VS 144/76, 60, 20, 97.1. No apparent injury   . Flatulence, eructation, and gas pain 12/03/2009  . Fracture of greater trochanter of left femur (HCC) 03/08/14  . FTT (failure to thrive) in adult 12/15/2014  . Hearing loss   . Hemorrhoids   . Hip fracture (HCC)   . HTN (hypertension)   . Hypothyroidism 12/19/2007   Qualifier: Diagnosis of  By: Melvyn Neth CMA (AAMA), Patty  01/16/14 TSH 2.474 03/24/14 TSH 2.576    . 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 Loss of weight 10/13/2014   Multiple factorials: dementia, depression, advanced  age, possible AR of medications(metformin and Oxybutynin)-will increase Mirtazapine and continue supplement. Observe.    . Macular degeneration (senile) of retina, unspecified 12/12/2001  . Macular degeneration of both eyes 01/24/2013   Severe visual impairment. Nearly blind.   . Mitral valve prolapse   . Occlusion and stenosis of carotid artery without mention of cerebral infarction 12/27/2007  . Other and  unspecified hyperlipidemia   . Other malaise and fatigue 12/06/2007  . Pacemaker-dual  medtronic 09/07/2011  . Personal history of fall 01/29/2009  . Rectal bleeding   . Senile osteoporosis 12/13/2000  . Sinus node dysfunction (HCC)   . Syncope and collapse 12/27/2007  . Trochanteric fracture of left femur (HCC) 03/14/2014   CT scan did however reveal acute fracture in greater left trochanteric. Dr. Margarita Rana consulted. Recommended nonoperative management and weightbearing as tolerated and another 3 weeks of Lovenox for DVT prophylaxis.       . Type 2 diabetes mellitus with blindness, with macular edema, with severe nonproliferative retinopathy 12/19/2007   Qualifier: Diagnosis of  By: Melvyn Neth CMA (AAMA), Patty  01/09/14 Hgb A1c 6.5 01/16/14 Hgb A1c 6.5 03/24/14 Hgb 6.4 12/10/14 pharm: dc Metformin and CBG monitoring-failure to thrive and refusing to eat.     Marland Kitchen Unspecified hereditary and idiopathic peripheral neuropathy 10/28/202012  . Unspecified hypothyroidism 03/18/2010  . Unspecified urinary incontinence 02/22/2005  . Urinary tract infection, site not specified 04/10/2014  . Xerophthalmus 01/21/2015   Past Surgical History:  Procedure Laterality Date  . BREAST BIOPSY Left 1970   benign  . Left hip surgery  09/2005  . PACEMAKER INSERTION  2006   Medtronic In-Pulse  . Right hip surgery  08/2003  . TONSILLECTOMY    . 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 Codeine     unknown  . Ibuprofen     unknown  . Lisinopril     unknown  . Penicillins     unknown  . Sanctura [Trospium Chloride]     unknown    Allergies as of 04/10/2018      Reactions   Codeine    unknown   Ibuprofen    unknown   Lisinopril    unknown   Penicillins    unknown   Sanctura [trospium Chloride]    unknown      Medication List        Accurate as of 04/10/18 11:59 PM. Always use your most recent med list.           bisacodyl 10 MG suppository Commonly known as:  DULCOLAX Place 10 mg rectally daily as needed for moderate constipation.   escitalopram 10 MG tablet Commonly known as:  LEXAPRO Take 10 mg by mouth daily.   feeding supplement Liqd Commonly known as:  BOOST / RESOURCE BREEZE Take 1 Container by mouth 3 (three) times daily between meals.   LORazepam 1 MG tablet Commonly known as:  ATIVAN Take 0.5 mg by mouth daily. 4 PM   LORazepam 0.5 MG tablet Commonly known as:  ATIVAN Take 0.5 mg by mouth daily. 12 PM   magnesium hydroxide 400 MG/5ML suspension Commonly known as:  MILK OF MAGNESIA Take 30 mLs by mouth daily.   morphine 20 MG/5ML solution Take by mouth every 2 (two) hours as needed for pain. Give 0.50ml, sublingual   NON FORMULARY Take 4 drops by mouth every 2 (two) hours as needed.  atropine   sennosides-docusate sodium 8.6-50 MG tablet Commonly known as:  SENOKOT-S Take 2 tablets by mouth at bedtime. Reported on 12/11/2015   TYLENOL 325 MG tablet Generic drug:  acetaminophen Take 650 mg by mouth every 4 (four) hours as needed for mild pain or moderate pain.      ROS was provided with assistance of staff Review of Systems  Constitutional: Positive for fatigue. Negative for activity change, appetite change, chills, diaphoresis and fever.  HENT: Positive for trouble swallowing. Negative for congestion, hearing loss and voice change.        Nectar  Eyes:       Legally blind  Respiratory: Negative for cough and wheezing.   Cardiovascular: Negative for chest pain, palpitations and leg swelling.  Gastrointestinal: Negative for abdominal distention, abdominal pain, constipation, diarrhea, nausea and vomiting.  Genitourinary: Negative for difficulty urinating, dysuria and urgency.       Incontinent of urine.   Musculoskeletal: Positive for gait problem.       Non ambulatory  Skin: Negative for color change and pallor.  Neurological: Negative for dizziness,  speech difficulty, weakness and headaches.       Dementia  Psychiatric/Behavioral: Positive for agitation, behavioral problems and confusion. Negative for hallucinations and sleep disturbance. The patient is not nervous/anxious.     Immunization History  Administered Date(s) Administered  . Influenza Whole 05/10/2017  . Influenza-Unspecified 06/06/2013, 04/21/2015, 05/17/2016  . PPD Test 07/23/2011  . Pneumococcal Polysaccharide-23 03/14/2009  . Td 03/14/2009  . Tdap 03/14/2009  . Zoster 06/29/2006, 06/29/2006   Pertinent  Health Maintenance Due  Topic Date Due  . PNA vac Low Risk Adult (2 of 2 - PCV13) 03/14/2010  . HEMOGLOBIN A1C  06/08/2017  . FOOT EXAM  12/05/2017  . URINE MICROALBUMIN  12/06/2017  . INFLUENZA VACCINE  03/01/2018  . DEXA SCAN  01/23/2019 (Originally 07/27/1982)  . OPHTHALMOLOGY EXAM  Discontinued   Fall Risk  04/03/2018 03/30/2017 01/26/2015 03/06/2014  Falls in the past year? No No Yes Yes  Number falls in past yr: - - 1 1  Comment - - - 03/06/14  Injury with Fall? - - Yes Yes  Comment - - - left hip   Risk Factor Category  - - - High Fall Risk  Risk for fall due to : - - History of fall(s);Impaired balance/gait;Impaired mobility -  Follow up - - Falls evaluation completed -   Functional Status Survey:    Vitals:   04/10/18 1053  BP: 140/68  Pulse: 82  Resp: 16  Temp: 97.6 F (36.4 C)   There is no height or weight on file to calculate BMI. Physical Exam  Constitutional: She appears well-developed and well-nourished.  HENT:  Head: Normocephalic and atraumatic.  Eyes: Pupils are equal, round, and reactive to light. EOM are normal.  Legally blind  Neck: Normal range of motion. Neck supple. No JVD present. No thyromegaly present.  Cardiovascular: Normal rate and regular rhythm.  Murmur heard. Pacemaker.   Pulmonary/Chest: Effort normal and breath sounds normal. She has no wheezes. She has no rales.  Abdominal: Soft. Bowel sounds are normal. She  exhibits no distension.  Musculoskeletal: She exhibits no edema.  Total lift for transfer, non ambulatory.   Neurological: She is alert. No cranial nerve deficit. She exhibits normal muscle tone. Coordination normal.  Oriented to self  Skin: Skin is warm and dry.  Psychiatric: She has a normal mood and affect.    Labs reviewed: No results for input(s):  NA, K, CL, CO2, GLUCOSE, BUN, CREATININE, CALCIUM, MG, PHOS in the last 8760 hours. No results for input(s): AST, ALT, ALKPHOS, BILITOT, PROT, ALBUMIN in the last 8760 hours. No results for input(s): WBC, NEUTROABS, HGB, HCT, MCV, PLT in the last 8760 hours. Lab Results  Component Value Date   TSH 3.55 01/27/2015   Lab Results  Component Value Date   HGBA1C 5.2 12/06/2016   Lab Results  Component Value Date   CHOL 140 12/20/2012   HDL 38 12/20/2012   LDLCALC 57 12/20/2012   TRIG 227 (A) 12/20/2012   CHOLHDL 5.0 12/22/2007    Significant Diagnostic Results in last 30 days:  No results found.  Assessment/Plan  Alzheimer's dementia with behavioral disturbance dementia, failure to thrive, total care of ADLs, under Hospice service. She has prn Atropine ophthalmic solution, 4gtt SL q2h prn, Tylenol 650mg  q4h prn, Morphine 5mg  q2h prn, Ativan 0.5mg  bid for comfort measures.   Depression with anxiety mood is stable, continue Lexaprol 10mg  qd.   Constipation Constipation is managed, continue MOM 30ml qd, prn Dulcolax supp qd prn, Senokot S II qhs.     Family/ staff Communication: plan of care reviewed with the patient and charge nurse.   Labs/tests ordered: none   Time spend 25 minutes.

## 2018-04-10 NOTE — Assessment & Plan Note (Signed)
mood is stable, continue Lexaprol 10mg  qd.

## 2018-04-11 DIAGNOSIS — I4891 Unspecified atrial fibrillation: Secondary | ICD-10-CM | POA: Diagnosis not present

## 2018-04-11 DIAGNOSIS — R634 Abnormal weight loss: Secondary | ICD-10-CM | POA: Diagnosis not present

## 2018-04-11 DIAGNOSIS — R630 Anorexia: Secondary | ICD-10-CM | POA: Diagnosis not present

## 2018-04-11 DIAGNOSIS — F0151 Vascular dementia with behavioral disturbance: Secondary | ICD-10-CM | POA: Diagnosis not present

## 2018-04-11 DIAGNOSIS — I495 Sick sinus syndrome: Secondary | ICD-10-CM | POA: Diagnosis not present

## 2018-04-11 DIAGNOSIS — E46 Unspecified protein-calorie malnutrition: Secondary | ICD-10-CM | POA: Diagnosis not present

## 2018-04-16 DIAGNOSIS — E46 Unspecified protein-calorie malnutrition: Secondary | ICD-10-CM | POA: Diagnosis not present

## 2018-04-16 DIAGNOSIS — R630 Anorexia: Secondary | ICD-10-CM | POA: Diagnosis not present

## 2018-04-16 DIAGNOSIS — I495 Sick sinus syndrome: Secondary | ICD-10-CM | POA: Diagnosis not present

## 2018-04-16 DIAGNOSIS — R634 Abnormal weight loss: Secondary | ICD-10-CM | POA: Diagnosis not present

## 2018-04-16 DIAGNOSIS — F0151 Vascular dementia with behavioral disturbance: Secondary | ICD-10-CM | POA: Diagnosis not present

## 2018-04-16 DIAGNOSIS — I4891 Unspecified atrial fibrillation: Secondary | ICD-10-CM | POA: Diagnosis not present

## 2018-04-18 DIAGNOSIS — F0151 Vascular dementia with behavioral disturbance: Secondary | ICD-10-CM | POA: Diagnosis not present

## 2018-04-18 DIAGNOSIS — R634 Abnormal weight loss: Secondary | ICD-10-CM | POA: Diagnosis not present

## 2018-04-18 DIAGNOSIS — E46 Unspecified protein-calorie malnutrition: Secondary | ICD-10-CM | POA: Diagnosis not present

## 2018-04-18 DIAGNOSIS — I495 Sick sinus syndrome: Secondary | ICD-10-CM | POA: Diagnosis not present

## 2018-04-18 DIAGNOSIS — I4891 Unspecified atrial fibrillation: Secondary | ICD-10-CM | POA: Diagnosis not present

## 2018-04-18 DIAGNOSIS — R630 Anorexia: Secondary | ICD-10-CM | POA: Diagnosis not present

## 2018-04-20 DIAGNOSIS — E46 Unspecified protein-calorie malnutrition: Secondary | ICD-10-CM | POA: Diagnosis not present

## 2018-04-20 DIAGNOSIS — R634 Abnormal weight loss: Secondary | ICD-10-CM | POA: Diagnosis not present

## 2018-04-20 DIAGNOSIS — I4891 Unspecified atrial fibrillation: Secondary | ICD-10-CM | POA: Diagnosis not present

## 2018-04-20 DIAGNOSIS — R630 Anorexia: Secondary | ICD-10-CM | POA: Diagnosis not present

## 2018-04-20 DIAGNOSIS — I495 Sick sinus syndrome: Secondary | ICD-10-CM | POA: Diagnosis not present

## 2018-04-20 DIAGNOSIS — F0151 Vascular dementia with behavioral disturbance: Secondary | ICD-10-CM | POA: Diagnosis not present

## 2018-04-21 DIAGNOSIS — R634 Abnormal weight loss: Secondary | ICD-10-CM | POA: Diagnosis not present

## 2018-04-21 DIAGNOSIS — R630 Anorexia: Secondary | ICD-10-CM | POA: Diagnosis not present

## 2018-04-21 DIAGNOSIS — F0151 Vascular dementia with behavioral disturbance: Secondary | ICD-10-CM | POA: Diagnosis not present

## 2018-04-21 DIAGNOSIS — I4891 Unspecified atrial fibrillation: Secondary | ICD-10-CM | POA: Diagnosis not present

## 2018-04-21 DIAGNOSIS — E46 Unspecified protein-calorie malnutrition: Secondary | ICD-10-CM | POA: Diagnosis not present

## 2018-04-21 DIAGNOSIS — I495 Sick sinus syndrome: Secondary | ICD-10-CM | POA: Diagnosis not present

## 2018-04-23 DIAGNOSIS — F0151 Vascular dementia with behavioral disturbance: Secondary | ICD-10-CM | POA: Diagnosis not present

## 2018-04-23 DIAGNOSIS — I495 Sick sinus syndrome: Secondary | ICD-10-CM | POA: Diagnosis not present

## 2018-04-23 DIAGNOSIS — E46 Unspecified protein-calorie malnutrition: Secondary | ICD-10-CM | POA: Diagnosis not present

## 2018-04-23 DIAGNOSIS — R630 Anorexia: Secondary | ICD-10-CM | POA: Diagnosis not present

## 2018-04-23 DIAGNOSIS — R634 Abnormal weight loss: Secondary | ICD-10-CM | POA: Diagnosis not present

## 2018-04-23 DIAGNOSIS — I4891 Unspecified atrial fibrillation: Secondary | ICD-10-CM | POA: Diagnosis not present

## 2018-04-25 DIAGNOSIS — F0151 Vascular dementia with behavioral disturbance: Secondary | ICD-10-CM | POA: Diagnosis not present

## 2018-04-25 DIAGNOSIS — I495 Sick sinus syndrome: Secondary | ICD-10-CM | POA: Diagnosis not present

## 2018-04-25 DIAGNOSIS — R634 Abnormal weight loss: Secondary | ICD-10-CM | POA: Diagnosis not present

## 2018-04-25 DIAGNOSIS — E46 Unspecified protein-calorie malnutrition: Secondary | ICD-10-CM | POA: Diagnosis not present

## 2018-04-25 DIAGNOSIS — I4891 Unspecified atrial fibrillation: Secondary | ICD-10-CM | POA: Diagnosis not present

## 2018-04-25 DIAGNOSIS — R630 Anorexia: Secondary | ICD-10-CM | POA: Diagnosis not present

## 2018-04-30 DIAGNOSIS — R634 Abnormal weight loss: Secondary | ICD-10-CM | POA: Diagnosis not present

## 2018-04-30 DIAGNOSIS — I4891 Unspecified atrial fibrillation: Secondary | ICD-10-CM | POA: Diagnosis not present

## 2018-04-30 DIAGNOSIS — F0151 Vascular dementia with behavioral disturbance: Secondary | ICD-10-CM | POA: Diagnosis not present

## 2018-04-30 DIAGNOSIS — E46 Unspecified protein-calorie malnutrition: Secondary | ICD-10-CM | POA: Diagnosis not present

## 2018-04-30 DIAGNOSIS — R630 Anorexia: Secondary | ICD-10-CM | POA: Diagnosis not present

## 2018-04-30 DIAGNOSIS — I495 Sick sinus syndrome: Secondary | ICD-10-CM | POA: Diagnosis not present

## 2018-05-01 DIAGNOSIS — I495 Sick sinus syndrome: Secondary | ICD-10-CM | POA: Diagnosis not present

## 2018-05-01 DIAGNOSIS — R634 Abnormal weight loss: Secondary | ICD-10-CM | POA: Diagnosis not present

## 2018-05-01 DIAGNOSIS — E785 Hyperlipidemia, unspecified: Secondary | ICD-10-CM | POA: Diagnosis not present

## 2018-05-01 DIAGNOSIS — I10 Essential (primary) hypertension: Secondary | ICD-10-CM | POA: Diagnosis not present

## 2018-05-01 DIAGNOSIS — M84359S Stress fracture, hip, unspecified, sequela: Secondary | ICD-10-CM | POA: Diagnosis not present

## 2018-05-01 DIAGNOSIS — I4891 Unspecified atrial fibrillation: Secondary | ICD-10-CM | POA: Diagnosis not present

## 2018-05-01 DIAGNOSIS — F339 Major depressive disorder, recurrent, unspecified: Secondary | ICD-10-CM | POA: Diagnosis not present

## 2018-05-01 DIAGNOSIS — H3530 Unspecified macular degeneration: Secondary | ICD-10-CM | POA: Diagnosis not present

## 2018-05-01 DIAGNOSIS — E039 Hypothyroidism, unspecified: Secondary | ICD-10-CM | POA: Diagnosis not present

## 2018-05-01 DIAGNOSIS — E46 Unspecified protein-calorie malnutrition: Secondary | ICD-10-CM | POA: Diagnosis not present

## 2018-05-01 DIAGNOSIS — E1159 Type 2 diabetes mellitus with other circulatory complications: Secondary | ICD-10-CM | POA: Diagnosis not present

## 2018-05-01 DIAGNOSIS — F0151 Vascular dementia with behavioral disturbance: Secondary | ICD-10-CM | POA: Diagnosis not present

## 2018-05-01 DIAGNOSIS — R630 Anorexia: Secondary | ICD-10-CM | POA: Diagnosis not present

## 2018-05-02 DIAGNOSIS — R630 Anorexia: Secondary | ICD-10-CM | POA: Diagnosis not present

## 2018-05-02 DIAGNOSIS — F0151 Vascular dementia with behavioral disturbance: Secondary | ICD-10-CM | POA: Diagnosis not present

## 2018-05-02 DIAGNOSIS — I4891 Unspecified atrial fibrillation: Secondary | ICD-10-CM | POA: Diagnosis not present

## 2018-05-02 DIAGNOSIS — R634 Abnormal weight loss: Secondary | ICD-10-CM | POA: Diagnosis not present

## 2018-05-02 DIAGNOSIS — I495 Sick sinus syndrome: Secondary | ICD-10-CM | POA: Diagnosis not present

## 2018-05-02 DIAGNOSIS — E46 Unspecified protein-calorie malnutrition: Secondary | ICD-10-CM | POA: Diagnosis not present

## 2018-05-07 DIAGNOSIS — E46 Unspecified protein-calorie malnutrition: Secondary | ICD-10-CM | POA: Diagnosis not present

## 2018-05-07 DIAGNOSIS — R630 Anorexia: Secondary | ICD-10-CM | POA: Diagnosis not present

## 2018-05-07 DIAGNOSIS — I4891 Unspecified atrial fibrillation: Secondary | ICD-10-CM | POA: Diagnosis not present

## 2018-05-07 DIAGNOSIS — F0151 Vascular dementia with behavioral disturbance: Secondary | ICD-10-CM | POA: Diagnosis not present

## 2018-05-07 DIAGNOSIS — R634 Abnormal weight loss: Secondary | ICD-10-CM | POA: Diagnosis not present

## 2018-05-07 DIAGNOSIS — I495 Sick sinus syndrome: Secondary | ICD-10-CM | POA: Diagnosis not present

## 2018-05-08 DIAGNOSIS — R634 Abnormal weight loss: Secondary | ICD-10-CM | POA: Diagnosis not present

## 2018-05-08 DIAGNOSIS — I495 Sick sinus syndrome: Secondary | ICD-10-CM | POA: Diagnosis not present

## 2018-05-08 DIAGNOSIS — R630 Anorexia: Secondary | ICD-10-CM | POA: Diagnosis not present

## 2018-05-08 DIAGNOSIS — I4891 Unspecified atrial fibrillation: Secondary | ICD-10-CM | POA: Diagnosis not present

## 2018-05-08 DIAGNOSIS — E46 Unspecified protein-calorie malnutrition: Secondary | ICD-10-CM | POA: Diagnosis not present

## 2018-05-08 DIAGNOSIS — F0151 Vascular dementia with behavioral disturbance: Secondary | ICD-10-CM | POA: Diagnosis not present

## 2018-05-09 ENCOUNTER — Non-Acute Institutional Stay (SKILLED_NURSING_FACILITY): Payer: Medicare Other | Admitting: Family Medicine

## 2018-05-09 ENCOUNTER — Encounter: Payer: Self-pay | Admitting: Family Medicine

## 2018-05-09 ENCOUNTER — Encounter: Payer: Self-pay | Admitting: Nurse Practitioner

## 2018-05-09 DIAGNOSIS — I10 Essential (primary) hypertension: Secondary | ICD-10-CM | POA: Diagnosis not present

## 2018-05-09 DIAGNOSIS — E032 Hypothyroidism due to medicaments and other exogenous substances: Secondary | ICD-10-CM | POA: Diagnosis not present

## 2018-05-09 DIAGNOSIS — F0281 Dementia in other diseases classified elsewhere with behavioral disturbance: Secondary | ICD-10-CM

## 2018-05-09 DIAGNOSIS — R634 Abnormal weight loss: Secondary | ICD-10-CM | POA: Diagnosis not present

## 2018-05-09 DIAGNOSIS — I495 Sick sinus syndrome: Secondary | ICD-10-CM | POA: Diagnosis not present

## 2018-05-09 DIAGNOSIS — R630 Anorexia: Secondary | ICD-10-CM | POA: Diagnosis not present

## 2018-05-09 DIAGNOSIS — F02818 Dementia in other diseases classified elsewhere, unspecified severity, with other behavioral disturbance: Secondary | ICD-10-CM

## 2018-05-09 DIAGNOSIS — I4891 Unspecified atrial fibrillation: Secondary | ICD-10-CM | POA: Diagnosis not present

## 2018-05-09 DIAGNOSIS — G301 Alzheimer's disease with late onset: Secondary | ICD-10-CM | POA: Diagnosis not present

## 2018-05-09 DIAGNOSIS — F0151 Vascular dementia with behavioral disturbance: Secondary | ICD-10-CM | POA: Diagnosis not present

## 2018-05-09 DIAGNOSIS — E46 Unspecified protein-calorie malnutrition: Secondary | ICD-10-CM | POA: Diagnosis not present

## 2018-05-09 NOTE — Progress Notes (Signed)
Provider:  Jacalyn Lefevre, MD Location:  [facility] Room Number: 30 Place of Service:  SNF ([phone])  PCP: Frederica Kuster, MD Patient Care Team: Frederica Kuster, MD as PCP - General (Family Medicine) Mast, Man X, NP as Nurse Practitioner (Nurse Practitioner) [place] [place], [place] Lowella Bandy., MD as Consulting Physician (Urology) Rachael Fee, MD as Consulting Physician (Gastroenterology) Salvatore Marvel, MD as Consulting Physician (Orthopedic Surgery) Venancio Poisson, MD as Consulting Physician (Dermatology) Sheral Apley, MD as Attending Physician (Orthopedic Surgery)  Extended Emergency Contact Information Primary Emergency Contact: Cook,[name] Address: [address]          [place], [place] [address] [place] Home Phone: [phone] Work Phone: [phone] Mobile Phone: [phone] Relation: Son Secondary Emergency Contact: Saraceno,[name] Address: [address]          [place], [place] [address] [place] Home Phone: [phone] Relation: Relative  Code Status: Full Code Goals of Care: Advanced Directive information Advanced Directives 05/09/2018  Does Patient Have a Medical Advance Directive? Yes  Type of Advance Directive Living will;Out of facility DNR (pink MOST or yellow form)  Does patient want to make changes to medical advance directive? No - Patient declined  Copy of Healthcare Power of Attorney in Chart? -  Pre-existing out of facility DNR order (yellow form or pink MOST form) Yellow form placed in chart (order not valid for inpatient use);Pink MOST form placed in chart (order not valid for inpatient use)      Chief Complaint  Patient presents with  . Medical Management of Chronic Issues    F/u-Alzheimers, dementia, depression, HTN    HPI: Patient is a 84 y.o. female seen today for chronic diseases including Alzheimer's, atrial fibrillation.  Patient is currently under hospice care.  She is  blind and occasionally yells out but does not respond and is rarely understood.  She is incontinent of bowel and bladder and she receives total care for her activities of daily living.  Current medications include lorazepam, morphine, Lexapro, and docusate for constipation.  Past Medical History:  Diagnosis Date  . Abnormality of gait 09/08/2003  . AF (atrial fibrillation) (HCC)   . Anxiety state, unspecified 08/19/2010  . Aortic stenosis   . Atrial fibrillation (HCC) 07/14/2009   Qualifier: Diagnosis of  By: [name], MD, Ruthann Cancer Ty Hilts   . Bradycardia   . Cardiac pacemaker in situ 09/12/2004  . Closed fracture of lumbar vertebra without mention of spinal cord injury [id number]  . Closed fracture of unspecified trochanteric section of femur 05/29/2011  . Constipation 03/14/2014  . Dementia (HCC) 2013   04/03/14 MMSE 22/30   . Depression 04/10/2014  . Diffuse cystic mastopathy 12/23/2009  . Disturbance of salivary secretion [id number]  . Edema 08/18/2003  . Fall 12/19/2012   07/22/14 fall VS 144/76, 60, 20, 97.1. No apparent injury   . Flatulence, eructation, and gas pain 12/03/2009  . Fracture of greater trochanter of left femur (HCC) 03/08/14  . FTT (failure to thrive) in adult 12/15/2014  . Hearing loss   . Hemorrhoids   . Hip fracture (HCC)   . HTN (hypertension)   . Hypothyroidism 12/19/2007   Qualifier: Diagnosis of  By: Melvyn Neth CMA (AAMA), Patty  01/16/14 TSH 2.474 03/24/14 TSH 2.576    . 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 Loss of weight 10/13/2014   Multiple factorials: dementia, depression, advanced age, possible AR  of medications(metformin and Oxybutynin)-will increase Mirtazapine and continue supplement. Observe.    . Macular degeneration (senile) of retina, unspecified 12/12/2001  . Macular degeneration of both eyes 01/24/2013   Severe visual impairment. Nearly blind.   . Mitral valve prolapse   . Occlusion and stenosis of carotid artery  without mention of cerebral infarction 12/27/2007  . Other and unspecified hyperlipidemia   . Other malaise and fatigue 12/06/2007  . Pacemaker-dual  medtronic 09/07/2011  . Personal history of fall 01/29/2009  . Rectal bleeding   . Senile osteoporosis 12/13/2000  . Sinus node dysfunction (HCC)   . Syncope and collapse 12/27/2007  . Trochanteric fracture of left femur (HCC) 03/14/2014   CT scan did however reveal acute fracture in greater left trochanteric. Dr. Margarita Rana consulted. Recommended nonoperative management and weightbearing as tolerated and another 3 weeks of Lovenox for DVT prophylaxis.       . Type 2 diabetes mellitus with blindness, with macular edema, with severe nonproliferative retinopathy 12/19/2007   Qualifier: Diagnosis of  By: Melvyn Neth CMA (AAMA), Patty  01/09/14 Hgb A1c 6.5 01/16/14 Hgb A1c 6.5 03/24/14 Hgb 6.4 12/10/14 pharm: dc Metformin and CBG monitoring-failure to thrive and refusing to eat.     Marland Kitchen Unspecified hereditary and idiopathic peripheral neuropathy 10/28/202012  . Unspecified hypothyroidism 03/18/2010  . Unspecified urinary incontinence 02/22/2005  . Urinary tract infection, site not specified 04/10/2014  . Xerophthalmus 01/21/2015   Past Surgical History:  Procedure Laterality Date  . BREAST BIOPSY Left 1970   benign  . Left hip surgery  09/2005  . PACEMAKER INSERTION  2006   Medtronic In-Pulse  . Right hip surgery  08/2003  . TONSILLECTOMY    . 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 She has never used smokeless tobacco. She reports that she does not drink alcohol or use drugs. Social History   Socioeconomic History  . Marital status: Widowed    Spouse name: Not on file  . Number of children: Not on file  . Years of education: Not on file  . Highest education level: Not on file  Occupational History  . Not on file  Social Needs  . Financial resource  strain: Not on file  . Food insecurity:    Worry: Not on file    Inability: Not on file  . Transportation needs:    Medical: Not on file    Non-medical: Not on file  Tobacco Use  . Smoking status: Never Smoker  . Smokeless tobacco: Never Used  Substance and Sexual Activity  . Alcohol use: No  . Drug use: No  . Sexual activity: Never  Lifestyle  . Physical activity:    Days per week: Not on file    Minutes per session: Not on file  . Stress: Not on file  Relationships  . Social connections:    Talks on phone: Not on file    Gets together: Not on file    Attends religious service: Not on file    Active member of club or organization: Not on file    Attends meetings of clubs or organizations: Not on file    Relationship status: Not on file  . Intimate partner violence:    Fear of current or ex partner: Not on file    Emotionally abused: Not on file    Physically abused: Not on file    Forced sexual  activity: Not on file  Other Topics Concern  . Not on file  Social History Narrative   Lives at [facility] since 2010 [place], transferred to memory care unit 04/29/14   Widowed   Never smoked   Alcohol none   Exercise none   DNR/ Living Will, MOST, POA      Admitted to [facility] 08/05/2015    Functional Status Survey:    Family History  Problem Relation Age of Onset  . Colon cancer Mother   . Cancer Mother        colon  . Heart attack Father   . Heart disease Father        MI  . Heart disease Sister        MI    Health Maintenance  Topic Date Due  . PNA vac Low Risk Adult (2 of 2 - PCV13) 03/14/2010  . HEMOGLOBIN A1C  06/08/2017  . FOOT EXAM  12/05/2017  . URINE MICROALBUMIN  12/06/2017  . INFLUENZA VACCINE  03/01/2018  . DEXA SCAN  01/23/2019 (Originally 07/27/1982)  . TETANUS/TDAP  03/15/2019  . OPHTHALMOLOGY EXAM  Discontinued    Allergies  Allergen Reactions  . Codeine     unknown  . Ibuprofen     unknown  . Lisinopril     unknown  .  Penicillins     unknown  . [place] [Trospium Chloride]     unknown    Outpatient Encounter Medications as of 05/09/2018  Medication Sig  . acetaminophen (TYLENOL) 325 MG tablet Take 650 mg by mouth every 4 (four) hours as needed for mild pain or moderate pain.   Melene Muller ON 05/10/2018] atropine 1 % ophthalmic solution Place 4 drops under the tongue every 12 (twelve) hours.  . bisacodyl (DULCOLAX) 10 MG suppository Place 10 mg rectally daily as needed for moderate constipation.   Marland Kitchen escitalopram (LEXAPRO) 10 MG tablet Take 10 mg by mouth daily.   . feeding supplement (BOOST / RESOURCE BREEZE) LIQD Take 1 Container by mouth 3 (three) times daily between meals.  Marland Kitchen LORazepam (ATIVAN) 0.5 MG tablet Take 0.5 mg by mouth daily. 12 PM  . LORazepam (ATIVAN) 1 MG tablet Take 0.5 mg by mouth daily. 4 PM   . magnesium hydroxide (MILK OF MAGNESIA) 400 MG/5ML suspension Take 30 mLs by mouth daily.  Marland Kitchen morphine 20 MG/5ML solution Take by mouth every 2 (two) hours as needed for pain. Give 0.42ml, sublingual  . sennosides-docusate sodium (SENOKOT-S) 8.6-50 MG tablet Take 2 tablets by mouth at bedtime. Reported on 12/11/2015  . [DISCONTINUED] NON FORMULARY Take 4 drops by mouth every 2 (two) hours as needed. atropine  . [DISCONTINUED] atropine 1 % ophthalmic solution 4 drop    No facility-administered encounter medications on file as of 05/09/2018.     Review of Systems  Constitutional: Positive for activity change.  HENT: Negative.   Respiratory: Negative.   Cardiovascular: Negative.   Gastrointestinal: Positive for constipation.  Musculoskeletal: Negative.   Allergic/Immunologic: Negative.   Neurological: Negative.   Hematological: Negative.   Psychiatric/Behavioral: Positive for decreased concentration.    Vitals:   05/09/18 1137  BP: (!) 170/64  Pulse: (!) 52  Resp: 20  Temp: (!) 97.5 F (36.4 C)  SpO2: 98%  Weight: 88 lb 6.4 oz (40.1 kg)  Height: 5\' 2"  (1.575 m)   Body mass index is  16.17 kg/m. Physical Exam  Constitutional: She appears well-developed.  Patient is seated in [place] chair at the dining  table.  She basically sleeps through our encounter and is minimally responsive  HENT:  Head: Normocephalic.  Mouth/Throat: Oropharynx is clear and moist.  Eyes: Pupils are equal, round, and reactive to light.  Cardiovascular: Normal rate.  Heart sounds are very distant but  are irregular  Pulmonary/Chest: Effort normal and breath sounds normal.  Abdominal: Soft.  Musculoskeletal:  Patient goes from bed to [facility] chair and is nonambulatory  Neurological:  She is minimally alert and does not respond to any questions  Skin: Skin is warm and dry.  Nursing note and vitals reviewed.   Labs reviewed: Basic Metabolic Panel: No results for input(s): NA, K, CL, CO2, GLUCOSE, BUN, CREATININE, CALCIUM, MG, PHOS in the last 8760 hours. Liver Function Tests: No results for input(s): AST, ALT, ALKPHOS, BILITOT, PROT, ALBUMIN in the last 8760 hours. No results for input(s): LIPASE, AMYLASE in the last 8760 hours. No results for input(s): AMMONIA in the last 8760 hours. CBC: No results for input(s): WBC, NEUTROABS, HGB, HCT, MCV, PLT in the last 8760 hours. Cardiac Enzymes: No results for input(s): CKTOTAL, CKMB, CKMBINDEX, TROPONINI in the last 8760 hours. BNP: Invalid input(s): POCBNP Lab Results  Component Value Date   HGBA1C 5.2 12/06/2016   Lab Results  Component Value Date   TSH 3.55 01/27/2015   Lab Results  Component Value Date   VITAMINB12 431 03/09/2014   No results found for: FOLATE No results found for: IRON, TIBC, FERRITIN  Imaging and Procedures obtained prior to SNF admission: Ct Abdomen Pelvis Wo Contrast  Result Date: 03/08/2014 CLINICAL DATA:  Low back pain.  Left hip pain. EXAM: CT ABDOMEN AND PELVIS WITHOUT CONTRAST TECHNIQUE: Multidetector CT imaging of the abdomen and pelvis was performed following the standard protocol without IV contrast.  COMPARISON:  CT abdomen and pelvis 05/25/2011. Lumbar spine radiographs 03/08/2014. FINDINGS: The lung bases are clear without focal nodule, mass, or airspace disease. Coronary artery calcifications are present. Pacing wires are in place. Heart size is normal. No significant pleural or pericardial effusion is present. The liver and spleen are within normal limits. A small hiatal hernia is noted. Stomach, duodenum, and pancreas are otherwise unremarkable. The common bile duct and gallbladder are normal. The adrenal glands are normal bilaterally. The kidneys and ureters are within normal limits. The rectosigmoid colon is within normal limits. The remainder the colon is unremarkable. The appendix is not discretely visualized and may be surgically absent. Small bowel is within normal limits. Patient is status post right total hip arthroplasty. ORIF for intertrochanteric fracture is noted on the left. A nondisplaced fracture is noted along the left greater trochanter. This is new since prior exam. This may be a chronic injury. A remote L5 compression fracture is present. No acute fracture or traumatic subluxation is present within the thoracolumbar spine. IMPRESSION: 1. Nondisplaced fracture along the left greater trochanter is new since prior exam. It is unclear if this is acute or chronic injury. 2. Postoperative changes of both hips. 3. Remote L5 compression fracture. 4. Extensive atherosclerotic changes including coronary artery disease. 5. Small hiatal hernia. 6. No acute intra-abdominal lesion to explain the patient's symptoms. Electronically Signed   By: Gennette Pac M.D.   On: 03/08/2014 12:40   Dg Chest 1 View  Result Date: 03/08/2014 CLINICAL DATA:  Fall. EXAM: CHEST - 1 VIEW COMPARISON:  05/25/2011 FINDINGS: Stable appearance of dual-chamber pacemaker. The heart size is stable and within normal limits. Lung volumes are low bilaterally. There is no evidence of pulmonary  edema, consolidation, pneumothorax,  nodule or pleural fluid. IMPRESSION: Low lung volumes.  No active disease. Electronically Signed   By: Irish Lack M.D.   On: 03/08/2014 09:39   Dg Lumbar Spine Complete  Result Date: 03/08/2014 CLINICAL DATA:  Fall on Thursday and yesterday. Back pain. Bilateral hip pain. EXAM: LUMBAR SPINE - COMPLETE 4+ VIEW COMPARISON:  03/08/2014 FINDINGS: There is chronic wedge compression fracture of L5, T11, and T10. No acute fracture identified. No suspicious lytic or blastic lesions are identified. There is moderate atherosclerotic calcification of the aorta and its branches. Patient has had ORIF of the left hip and right hip arthroplasty. IMPRESSION: 1. Chronic wedge compression fractures. 2.  No evidence for acute  abnormality. Electronically Signed   By: Rosalie Gums M.D.   On: 03/08/2014 09:51   Dg Pelvis 1-2 Views  Result Date: 03/08/2014 CLINICAL DATA:  Fall today. Hip pain. History of bilateral hip surgery and hip fracture. EXAM: PELVIS - 1-2 VIEW COMPARISON:  CT of the abdomen and pelvis on 05/25/2011 FINDINGS: Patient has had left hip ORIF and right hip arthroplasty. Bones appear radiolucent. There is no evidence for acute fracture or subluxation. Visualized bowel gas pattern is nonobstructive. There is atherosclerotic calcification of the femoral arteries. IMPRESSION: 1. Postoperative changes. 2.  No evidence for acute  abnormality. Electronically Signed   By: Rosalie Gums M.D.   On: 03/08/2014 09:36   Ct Head Wo Contrast  Result Date: 03/08/2014 CLINICAL DATA:  falls, posterior scalp hematoma.  Recent falls. EXAM: CT HEAD WITHOUT CONTRAST TECHNIQUE: Contiguous axial images were obtained from the base of the skull through the vertex without intravenous contrast. COMPARISON:  05/24/2011 FINDINGS: There is moderate central and cortical atrophy. Periventricular white matter changes are consistent with small vessel disease. There is no evidence for hemorrhage, mass lesion, or acute infarction. Right  posterior parietal scalp hematoma is noted. There is no underlying calvarial fracture. Small fluid level is identified within the right sphenoid sinus. No sinus wall fracture identified. There is atherosclerotic calcification of the internal carotid arteries. IMPRESSION: 1. Atrophy and small vessel disease. 2.  No evidence for acute intracranial abnormality. 3. Right posterior parietal scalp hematoma without underlying calvarial fracture. 4. Sinusitis involving the right sphenoid air cell. No evidence for sinus wall fracture Electronically Signed   By: Rosalie Gums M.D.   On: 03/08/2014 09:25    Assessment/Plan 1. Essential hypertension Blood pressure medicines have been discontinued.  Blood pressure is slightly elevated today but has not general, after reviewing nursing notes is acceptable.  I do not feel like she needs treatment for her blood pressure.  2. Hypothyroidism due to non-medication exogenous substances Had formally been on thyroid replacement but we are no longer checking her thyroid function.  Last TSH was 3 years ago.  3. Late onset Alzheimer's disease with behavioral disturbance (HCC) She appears to be an late stages of Alzheimer's and hospice care is appropriate.  Ativan helps control any behaviors as does morphine.  I do not think she needs to continue taking Lexapro.  It is not really known to be helpful at this time of life.   Family/ staff Communication:   Labs/tests ordered:  Bertram Millard. Hyacinth Meeker, MD [facility] [address] [place], [place] [address] Office [phone]

## 2018-05-09 NOTE — Progress Notes (Deleted)
Provider:  Jacalyn Lefevre, MD Location:  [facility] Room Number: 30 Place of Service:  SNF ([phone])  PCP: Frederica Kuster, MD Patient Care Team: Frederica Kuster, MD as PCP - General (Family Medicine) Mast, Man X, NP as Nurse Practitioner (Nurse Practitioner) [place] [place], [place] Lowella Bandy., MD as Consulting Physician (Urology) Rachael Fee, MD as Consulting Physician (Gastroenterology) Salvatore Marvel, MD as Consulting Physician (Orthopedic Surgery) Venancio Poisson, MD as Consulting Physician (Dermatology) Sheral Apley, MD as Attending Physician (Orthopedic Surgery)  Extended Emergency Contact Information Primary Emergency Contact: Cook,[name] Address: [address]          [place], [place] [address] [place] Home Phone: [phone] Work Phone: [phone] Mobile Phone: [phone] Relation: Son Secondary Emergency Contact: Hildreth,[name] Address: [address]          [place], [place] [address] [place] Home Phone: [phone] Relation: Relative  Code Status:  Goals of Care: Advanced Directive information Advanced Directives 04/03/2018  Does Patient Have a Medical Advance Directive? Yes  Type of Advance Directive Living will;Out of facility DNR (pink MOST or yellow form)  Does patient want to make changes to medical advance directive? No - Patient declined  Copy of Healthcare Power of Attorney in Chart? -  Pre-existing out of facility DNR order (yellow form or pink MOST form) Yellow form placed in chart (order not valid for inpatient use);Pink MOST form placed in chart (order not valid for inpatient use)      Chief Complaint  Patient presents with  . Medical Management of Chronic Issues    HPI: Patient is a 84 y.o. female seen today for admission to  Past Medical History:  Diagnosis Date  . Abnormality of gait 09/08/2003  . AF (atrial fibrillation) (HCC)   . Anxiety state, unspecified 08/19/2010  .  Aortic stenosis   . Atrial fibrillation (HCC) 07/14/2009   Qualifier: Diagnosis of  By: [name], MD, Ruthann Cancer Ty Hilts   . Bradycardia   . Cardiac pacemaker in situ 09/12/2004  . Closed fracture of lumbar vertebra without mention of spinal cord injury 110/17/202010  . Closed fracture of unspecified trochanteric section of femur 05/29/2011  . Constipation 03/14/2014  . Dementia 2013   04/03/14 MMSE 22/30   . Depression 04/10/2014  . Diffuse cystic mastopathy 12/23/2009  . Disturbance of salivary secretion [id number]  . Edema 08/18/2003  . Fall 12/19/2012   07/22/14 fall VS 144/76, 60, 20, 97.1. No apparent injury   . Flatulence, eructation, and gas pain 12/03/2009  . Fracture of greater trochanter of left femur (HCC) 03/08/14  . FTT (failure to thrive) in adult 12/15/2014  . Hearing loss   . Hemorrhoids   . Hip fracture (HCC)   . HTN (hypertension)   . Hypothyroidism 12/19/2007   Qualifier: Diagnosis of  By: Melvyn Neth CMA (AAMA), Patty  01/16/14 TSH 2.474 03/24/14 TSH 2.576    . 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 Loss of weight 10/13/2014   Multiple factorials: dementia, depression, advanced age, possible AR of medications(metformin and Oxybutynin)-will increase Mirtazapine and continue supplement. Observe.    . Macular degeneration (senile) of retina, unspecified 12/12/2001  . Macular degeneration of both eyes 01/24/2013   Severe visual impairment. Nearly blind.   . Mitral valve prolapse   . Occlusion and stenosis of carotid artery without mention of cerebral infarction 12/27/2007  . Other and unspecified hyperlipidemia   .  Other malaise and fatigue 12/06/2007  . Pacemaker-dual  medtronic 09/07/2011  . Personal history of fall 01/29/2009  . Rectal bleeding   . Senile osteoporosis 12/13/2000  . Sinus node dysfunction (HCC)   . Syncope and collapse 12/27/2007  . Trochanteric fracture of left femur (HCC) 03/14/2014   CT scan did however reveal acute fracture in  greater left trochanteric. Dr. Margarita Rana consulted. Recommended nonoperative management and weightbearing as tolerated and another 3 weeks of Lovenox for DVT prophylaxis.       . Type 2 diabetes mellitus with blindness, with macular edema, with severe nonproliferative retinopathy 12/19/2007   Qualifier: Diagnosis of  By: Melvyn Neth CMA (AAMA), Patty  01/09/14 Hgb A1c 6.5 01/16/14 Hgb A1c 6.5 03/24/14 Hgb 6.4 12/10/14 pharm: dc Metformin and CBG monitoring-failure to thrive and refusing to eat.     Marland Kitchen Unspecified hereditary and idiopathic peripheral neuropathy 10/28/202012  . Unspecified hypothyroidism 03/18/2010  . Unspecified urinary incontinence 02/22/2005  . Urinary tract infection, site not specified 04/10/2014  . Xerophthalmus 01/21/2015   Past Surgical History:  Procedure Laterality Date  . BREAST BIOPSY Left 1970   benign  . Left hip surgery  09/2005  . PACEMAKER INSERTION  2006   Medtronic In-Pulse  . Right hip surgery  08/2003  . TONSILLECTOMY    . 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 She has never used smokeless tobacco. She reports that she does not drink alcohol or use drugs. Social History   Socioeconomic History  . Marital status: Widowed    Spouse name: Not on file  . Number of children: Not on file  . Years of education: Not on file  . Highest education level: Not on file  Occupational History  . Not on file  Social Needs  . Financial resource strain: Not on file  . Food insecurity:    Worry: Not on file    Inability: Not on file  . Transportation needs:    Medical: Not on file    Non-medical: Not on file  Tobacco Use  . Smoking status: Never Smoker  . Smokeless tobacco: Never Used  Substance and Sexual Activity  . Alcohol use: No  . Drug use: No  . Sexual activity: Never  Lifestyle  . Physical activity:    Days per week: Not on file    Minutes per session:  Not on file  . Stress: Not on file  Relationships  . Social connections:    Talks on phone: Not on file    Gets together: Not on file    Attends religious service: Not on file    Active member of club or organization: Not on file    Attends meetings of clubs or organizations: Not on file    Relationship status: Not on file  . Intimate partner violence:    Fear of current or ex partner: Not on file    Emotionally abused: Not on file    Physically abused: Not on file    Forced sexual activity: Not on file  Other Topics Concern  . Not on file  Social History Narrative   Lives at [facility] since 2010 [place], transferred to memory care unit 04/29/14   Widowed   Never smoked   Alcohol none   Exercise none   DNR/ Living Will, MOST, POA      Admitted to [facility] 08/05/2015  Functional Status Survey:    Family History  Problem Relation Age of Onset  . Colon cancer Mother   . Cancer Mother        colon  . Heart attack Father   . Heart disease Father        MI  . Heart disease Sister        MI    Health Maintenance  Topic Date Due  . PNA vac Low Risk Adult (2 of 2 - PCV13) 03/14/2010  . HEMOGLOBIN A1C  06/08/2017  . FOOT EXAM  12/05/2017  . URINE MICROALBUMIN  12/06/2017  . INFLUENZA VACCINE  03/01/2018  . DEXA SCAN  01/23/2019 (Originally 07/27/1982)  . TETANUS/TDAP  03/15/2019  . OPHTHALMOLOGY EXAM  Discontinued    Allergies  Allergen Reactions  . Codeine     unknown  . Ibuprofen     unknown  . Lisinopril     unknown  . Penicillins     unknown  . [place] [Trospium Chloride]     unknown    Outpatient Encounter Medications as of 05/09/2018  Medication Sig  . acetaminophen (TYLENOL) 325 MG tablet Take 650 mg by mouth every 4 (four) hours as needed for mild pain or moderate pain.   . bisacodyl (DULCOLAX) 10 MG suppository Place 10 mg rectally daily as needed for moderate constipation.   Marland Kitchen escitalopram (LEXAPRO) 10 MG tablet Take 10 mg by mouth daily.    . feeding supplement (BOOST / RESOURCE BREEZE) LIQD Take 1 Container by mouth 3 (three) times daily between meals.  Marland Kitchen LORazepam (ATIVAN) 0.5 MG tablet Take 0.5 mg by mouth daily. 12 PM  . LORazepam (ATIVAN) 1 MG tablet Take 0.5 mg by mouth daily. 4 PM   . magnesium hydroxide (MILK OF MAGNESIA) 400 MG/5ML suspension Take 30 mLs by mouth daily.  Marland Kitchen morphine 20 MG/5ML solution Take by mouth every 2 (two) hours as needed for pain. Give 0.23ml, sublingual  . NON FORMULARY Take 4 drops by mouth every 2 (two) hours as needed. atropine  . sennosides-docusate sodium (SENOKOT-S) 8.6-50 MG tablet Take 2 tablets by mouth at bedtime. Reported on 12/11/2015   Facility-Administered Encounter Medications as of 05/09/2018  Medication  . atropine 1 % ophthalmic solution 4 drop    Review of Systems  There were no vitals filed for this visit. There is no height or weight on file to calculate BMI. Physical Exam  Labs reviewed: Basic Metabolic Panel: No results for input(s): NA, K, CL, CO2, GLUCOSE, BUN, CREATININE, CALCIUM, MG, PHOS in the last 8760 hours. Liver Function Tests: No results for input(s): AST, ALT, ALKPHOS, BILITOT, PROT, ALBUMIN in the last 8760 hours. No results for input(s): LIPASE, AMYLASE in the last 8760 hours. No results for input(s): AMMONIA in the last 8760 hours. CBC: No results for input(s): WBC, NEUTROABS, HGB, HCT, MCV, PLT in the last 8760 hours. Cardiac Enzymes: No results for input(s): CKTOTAL, CKMB, CKMBINDEX, TROPONINI in the last 8760 hours. BNP: Invalid input(s): POCBNP Lab Results  Component Value Date   HGBA1C 5.2 12/06/2016   Lab Results  Component Value Date   TSH 3.55 01/27/2015   Lab Results  Component Value Date   VITAMINB12 431 03/09/2014   No results found for: FOLATE No results found for: IRON, TIBC, FERRITIN  Imaging and Procedures obtained prior to SNF admission: Ct Abdomen Pelvis Wo Contrast  Result Date: 03/08/2014 CLINICAL DATA:  Low back  pain.  Left hip pain. EXAM: CT ABDOMEN AND PELVIS  WITHOUT CONTRAST TECHNIQUE: Multidetector CT imaging of the abdomen and pelvis was performed following the standard protocol without IV contrast. COMPARISON:  CT abdomen and pelvis 05/25/2011. Lumbar spine radiographs 03/08/2014. FINDINGS: The lung bases are clear without focal nodule, mass, or airspace disease. Coronary artery calcifications are present. Pacing wires are in place. Heart size is normal. No significant pleural or pericardial effusion is present. The liver and spleen are within normal limits. A small hiatal hernia is noted. Stomach, duodenum, and pancreas are otherwise unremarkable. The common bile duct and gallbladder are normal. The adrenal glands are normal bilaterally. The kidneys and ureters are within normal limits. The rectosigmoid colon is within normal limits. The remainder the colon is unremarkable. The appendix is not discretely visualized and may be surgically absent. Small bowel is within normal limits. Patient is status post right total hip arthroplasty. ORIF for intertrochanteric fracture is noted on the left. A nondisplaced fracture is noted along the left greater trochanter. This is new since prior exam. This may be a chronic injury. A remote L5 compression fracture is present. No acute fracture or traumatic subluxation is present within the thoracolumbar spine. IMPRESSION: 1. Nondisplaced fracture along the left greater trochanter is new since prior exam. It is unclear if this is acute or chronic injury. 2. Postoperative changes of both hips. 3. Remote L5 compression fracture. 4. Extensive atherosclerotic changes including coronary artery disease. 5. Small hiatal hernia. 6. No acute intra-abdominal lesion to explain the patient's symptoms. Electronically Signed   By: Gennette Pac M.D.   On: 03/08/2014 12:40   Dg Chest 1 View  Result Date: 03/08/2014 CLINICAL DATA:  Fall. EXAM: CHEST - 1 VIEW COMPARISON:  05/25/2011 FINDINGS:  Stable appearance of dual-chamber pacemaker. The heart size is stable and within normal limits. Lung volumes are low bilaterally. There is no evidence of pulmonary edema, consolidation, pneumothorax, nodule or pleural fluid. IMPRESSION: Low lung volumes.  No active disease. Electronically Signed   By: Irish Lack M.D.   On: 03/08/2014 09:39   Dg Lumbar Spine Complete  Result Date: 03/08/2014 CLINICAL DATA:  Fall on Thursday and yesterday. Back pain. Bilateral hip pain. EXAM: LUMBAR SPINE - COMPLETE 4+ VIEW COMPARISON:  03/08/2014 FINDINGS: There is chronic wedge compression fracture of L5, T11, and T10. No acute fracture identified. No suspicious lytic or blastic lesions are identified. There is moderate atherosclerotic calcification of the aorta and its branches. Patient has had ORIF of the left hip and right hip arthroplasty. IMPRESSION: 1. Chronic wedge compression fractures. 2.  No evidence for acute  abnormality. Electronically Signed   By: Rosalie Gums M.D.   On: 03/08/2014 09:51   Dg Pelvis 1-2 Views  Result Date: 03/08/2014 CLINICAL DATA:  Fall today. Hip pain. History of bilateral hip surgery and hip fracture. EXAM: PELVIS - 1-2 VIEW COMPARISON:  CT of the abdomen and pelvis on 05/25/2011 FINDINGS: Patient has had left hip ORIF and right hip arthroplasty. Bones appear radiolucent. There is no evidence for acute fracture or subluxation. Visualized bowel gas pattern is nonobstructive. There is atherosclerotic calcification of the femoral arteries. IMPRESSION: 1. Postoperative changes. 2.  No evidence for acute  abnormality. Electronically Signed   By: Rosalie Gums M.D.   On: 03/08/2014 09:36   Ct Head Wo Contrast  Result Date: 03/08/2014 CLINICAL DATA:  falls, posterior scalp hematoma.  Recent falls. EXAM: CT HEAD WITHOUT CONTRAST TECHNIQUE: Contiguous axial images were obtained from the base of the skull through the vertex without intravenous contrast.  COMPARISON:  05/24/2011 FINDINGS: There is  moderate central and cortical atrophy. Periventricular white matter changes are consistent with small vessel disease. There is no evidence for hemorrhage, mass lesion, or acute infarction. Right posterior parietal scalp hematoma is noted. There is no underlying calvarial fracture. Small fluid level is identified within the right sphenoid sinus. No sinus wall fracture identified. There is atherosclerotic calcification of the internal carotid arteries. IMPRESSION: 1. Atrophy and small vessel disease. 2.  No evidence for acute intracranial abnormality. 3. Right posterior parietal scalp hematoma without underlying calvarial fracture. 4. Sinusitis involving the right sphenoid air cell. No evidence for sinus wall fracture Electronically Signed   By: Rosalie Gums M.D.   On: 03/08/2014 09:25    Assessment/Plan There are no diagnoses linked to this encounter.   Family/ staff Communication:   Labs/tests ordered:  .smmsig

## 2018-05-09 NOTE — Progress Notes (Signed)
This encounter was created in error - please disregard.

## 2018-05-11 DIAGNOSIS — I495 Sick sinus syndrome: Secondary | ICD-10-CM | POA: Diagnosis not present

## 2018-05-11 DIAGNOSIS — R630 Anorexia: Secondary | ICD-10-CM | POA: Diagnosis not present

## 2018-05-11 DIAGNOSIS — R634 Abnormal weight loss: Secondary | ICD-10-CM | POA: Diagnosis not present

## 2018-05-11 DIAGNOSIS — I4891 Unspecified atrial fibrillation: Secondary | ICD-10-CM | POA: Diagnosis not present

## 2018-05-11 DIAGNOSIS — E46 Unspecified protein-calorie malnutrition: Secondary | ICD-10-CM | POA: Diagnosis not present

## 2018-05-11 DIAGNOSIS — F0151 Vascular dementia with behavioral disturbance: Secondary | ICD-10-CM | POA: Diagnosis not present

## 2018-05-14 DIAGNOSIS — R634 Abnormal weight loss: Secondary | ICD-10-CM | POA: Diagnosis not present

## 2018-05-14 DIAGNOSIS — F0151 Vascular dementia with behavioral disturbance: Secondary | ICD-10-CM | POA: Diagnosis not present

## 2018-05-14 DIAGNOSIS — I495 Sick sinus syndrome: Secondary | ICD-10-CM | POA: Diagnosis not present

## 2018-05-14 DIAGNOSIS — E46 Unspecified protein-calorie malnutrition: Secondary | ICD-10-CM | POA: Diagnosis not present

## 2018-05-14 DIAGNOSIS — I4891 Unspecified atrial fibrillation: Secondary | ICD-10-CM | POA: Diagnosis not present

## 2018-05-14 DIAGNOSIS — R630 Anorexia: Secondary | ICD-10-CM | POA: Diagnosis not present

## 2018-05-16 DIAGNOSIS — F0151 Vascular dementia with behavioral disturbance: Secondary | ICD-10-CM | POA: Diagnosis not present

## 2018-05-16 DIAGNOSIS — E46 Unspecified protein-calorie malnutrition: Secondary | ICD-10-CM | POA: Diagnosis not present

## 2018-05-16 DIAGNOSIS — I4891 Unspecified atrial fibrillation: Secondary | ICD-10-CM | POA: Diagnosis not present

## 2018-05-16 DIAGNOSIS — R630 Anorexia: Secondary | ICD-10-CM | POA: Diagnosis not present

## 2018-05-16 DIAGNOSIS — R634 Abnormal weight loss: Secondary | ICD-10-CM | POA: Diagnosis not present

## 2018-05-16 DIAGNOSIS — I495 Sick sinus syndrome: Secondary | ICD-10-CM | POA: Diagnosis not present

## 2018-05-18 DIAGNOSIS — E46 Unspecified protein-calorie malnutrition: Secondary | ICD-10-CM | POA: Diagnosis not present

## 2018-05-18 DIAGNOSIS — R630 Anorexia: Secondary | ICD-10-CM | POA: Diagnosis not present

## 2018-05-18 DIAGNOSIS — I495 Sick sinus syndrome: Secondary | ICD-10-CM | POA: Diagnosis not present

## 2018-05-18 DIAGNOSIS — F0151 Vascular dementia with behavioral disturbance: Secondary | ICD-10-CM | POA: Diagnosis not present

## 2018-05-18 DIAGNOSIS — R634 Abnormal weight loss: Secondary | ICD-10-CM | POA: Diagnosis not present

## 2018-05-18 DIAGNOSIS — I4891 Unspecified atrial fibrillation: Secondary | ICD-10-CM | POA: Diagnosis not present

## 2018-05-21 DIAGNOSIS — R630 Anorexia: Secondary | ICD-10-CM | POA: Diagnosis not present

## 2018-05-21 DIAGNOSIS — I4891 Unspecified atrial fibrillation: Secondary | ICD-10-CM | POA: Diagnosis not present

## 2018-05-21 DIAGNOSIS — I495 Sick sinus syndrome: Secondary | ICD-10-CM | POA: Diagnosis not present

## 2018-05-21 DIAGNOSIS — E46 Unspecified protein-calorie malnutrition: Secondary | ICD-10-CM | POA: Diagnosis not present

## 2018-05-21 DIAGNOSIS — F0151 Vascular dementia with behavioral disturbance: Secondary | ICD-10-CM | POA: Diagnosis not present

## 2018-05-21 DIAGNOSIS — R634 Abnormal weight loss: Secondary | ICD-10-CM | POA: Diagnosis not present

## 2018-05-23 DIAGNOSIS — R634 Abnormal weight loss: Secondary | ICD-10-CM | POA: Diagnosis not present

## 2018-05-23 DIAGNOSIS — I495 Sick sinus syndrome: Secondary | ICD-10-CM | POA: Diagnosis not present

## 2018-05-23 DIAGNOSIS — F0151 Vascular dementia with behavioral disturbance: Secondary | ICD-10-CM | POA: Diagnosis not present

## 2018-05-23 DIAGNOSIS — R630 Anorexia: Secondary | ICD-10-CM | POA: Diagnosis not present

## 2018-05-23 DIAGNOSIS — I4891 Unspecified atrial fibrillation: Secondary | ICD-10-CM | POA: Diagnosis not present

## 2018-05-23 DIAGNOSIS — E46 Unspecified protein-calorie malnutrition: Secondary | ICD-10-CM | POA: Diagnosis not present

## 2018-05-28 DIAGNOSIS — R634 Abnormal weight loss: Secondary | ICD-10-CM | POA: Diagnosis not present

## 2018-05-28 DIAGNOSIS — R630 Anorexia: Secondary | ICD-10-CM | POA: Diagnosis not present

## 2018-05-28 DIAGNOSIS — I4891 Unspecified atrial fibrillation: Secondary | ICD-10-CM | POA: Diagnosis not present

## 2018-05-28 DIAGNOSIS — I495 Sick sinus syndrome: Secondary | ICD-10-CM | POA: Diagnosis not present

## 2018-05-28 DIAGNOSIS — E46 Unspecified protein-calorie malnutrition: Secondary | ICD-10-CM | POA: Diagnosis not present

## 2018-05-28 DIAGNOSIS — F0151 Vascular dementia with behavioral disturbance: Secondary | ICD-10-CM | POA: Diagnosis not present

## 2018-05-30 DIAGNOSIS — R630 Anorexia: Secondary | ICD-10-CM | POA: Diagnosis not present

## 2018-05-30 DIAGNOSIS — E46 Unspecified protein-calorie malnutrition: Secondary | ICD-10-CM | POA: Diagnosis not present

## 2018-05-30 DIAGNOSIS — I495 Sick sinus syndrome: Secondary | ICD-10-CM | POA: Diagnosis not present

## 2018-05-30 DIAGNOSIS — F0151 Vascular dementia with behavioral disturbance: Secondary | ICD-10-CM | POA: Diagnosis not present

## 2018-05-30 DIAGNOSIS — I4891 Unspecified atrial fibrillation: Secondary | ICD-10-CM | POA: Diagnosis not present

## 2018-05-30 DIAGNOSIS — R634 Abnormal weight loss: Secondary | ICD-10-CM | POA: Diagnosis not present

## 2018-06-01 DIAGNOSIS — F339 Major depressive disorder, recurrent, unspecified: Secondary | ICD-10-CM | POA: Diagnosis not present

## 2018-06-01 DIAGNOSIS — I10 Essential (primary) hypertension: Secondary | ICD-10-CM | POA: Diagnosis not present

## 2018-06-01 DIAGNOSIS — R634 Abnormal weight loss: Secondary | ICD-10-CM | POA: Diagnosis not present

## 2018-06-01 DIAGNOSIS — E1159 Type 2 diabetes mellitus with other circulatory complications: Secondary | ICD-10-CM | POA: Diagnosis not present

## 2018-06-01 DIAGNOSIS — H3530 Unspecified macular degeneration: Secondary | ICD-10-CM | POA: Diagnosis not present

## 2018-06-01 DIAGNOSIS — M84359S Stress fracture, hip, unspecified, sequela: Secondary | ICD-10-CM | POA: Diagnosis not present

## 2018-06-01 DIAGNOSIS — I4891 Unspecified atrial fibrillation: Secondary | ICD-10-CM | POA: Diagnosis not present

## 2018-06-01 DIAGNOSIS — F0151 Vascular dementia with behavioral disturbance: Secondary | ICD-10-CM | POA: Diagnosis not present

## 2018-06-01 DIAGNOSIS — E46 Unspecified protein-calorie malnutrition: Secondary | ICD-10-CM | POA: Diagnosis not present

## 2018-06-01 DIAGNOSIS — I495 Sick sinus syndrome: Secondary | ICD-10-CM | POA: Diagnosis not present

## 2018-06-01 DIAGNOSIS — R630 Anorexia: Secondary | ICD-10-CM | POA: Diagnosis not present

## 2018-06-01 DIAGNOSIS — E785 Hyperlipidemia, unspecified: Secondary | ICD-10-CM | POA: Diagnosis not present

## 2018-06-01 DIAGNOSIS — E039 Hypothyroidism, unspecified: Secondary | ICD-10-CM | POA: Diagnosis not present

## 2018-06-04 DIAGNOSIS — I495 Sick sinus syndrome: Secondary | ICD-10-CM | POA: Diagnosis not present

## 2018-06-04 DIAGNOSIS — R630 Anorexia: Secondary | ICD-10-CM | POA: Diagnosis not present

## 2018-06-04 DIAGNOSIS — I4891 Unspecified atrial fibrillation: Secondary | ICD-10-CM | POA: Diagnosis not present

## 2018-06-04 DIAGNOSIS — E46 Unspecified protein-calorie malnutrition: Secondary | ICD-10-CM | POA: Diagnosis not present

## 2018-06-04 DIAGNOSIS — R634 Abnormal weight loss: Secondary | ICD-10-CM | POA: Diagnosis not present

## 2018-06-04 DIAGNOSIS — F0151 Vascular dementia with behavioral disturbance: Secondary | ICD-10-CM | POA: Diagnosis not present

## 2018-06-06 DIAGNOSIS — R630 Anorexia: Secondary | ICD-10-CM | POA: Diagnosis not present

## 2018-06-06 DIAGNOSIS — I4891 Unspecified atrial fibrillation: Secondary | ICD-10-CM | POA: Diagnosis not present

## 2018-06-06 DIAGNOSIS — E46 Unspecified protein-calorie malnutrition: Secondary | ICD-10-CM | POA: Diagnosis not present

## 2018-06-06 DIAGNOSIS — F0151 Vascular dementia with behavioral disturbance: Secondary | ICD-10-CM | POA: Diagnosis not present

## 2018-06-06 DIAGNOSIS — R634 Abnormal weight loss: Secondary | ICD-10-CM | POA: Diagnosis not present

## 2018-06-06 DIAGNOSIS — I495 Sick sinus syndrome: Secondary | ICD-10-CM | POA: Diagnosis not present

## 2018-06-08 ENCOUNTER — Encounter: Payer: Self-pay | Admitting: Family Medicine

## 2018-06-08 ENCOUNTER — Non-Acute Institutional Stay (SKILLED_NURSING_FACILITY): Payer: Medicare Other | Admitting: Family Medicine

## 2018-06-08 DIAGNOSIS — G301 Alzheimer's disease with late onset: Secondary | ICD-10-CM | POA: Diagnosis not present

## 2018-06-08 DIAGNOSIS — F0281 Dementia in other diseases classified elsewhere with behavioral disturbance: Secondary | ICD-10-CM

## 2018-06-08 DIAGNOSIS — I10 Essential (primary) hypertension: Secondary | ICD-10-CM | POA: Diagnosis not present

## 2018-06-08 DIAGNOSIS — E032 Hypothyroidism due to medicaments and other exogenous substances: Secondary | ICD-10-CM

## 2018-06-08 NOTE — Progress Notes (Signed)
Provider:  Jacalyn Lefevre, MD Location:  [facility] Room Number: 30 Place of Service:  SNF ([phone])  PCP: Frederica Kuster, MD Patient Care Team: Frederica Kuster, MD as PCP - General (Family Medicine) Mast, Man X, NP as Nurse Practitioner (Nurse Practitioner) [place] [place], [place] Lowella Bandy., MD as Consulting Physician (Urology) Rachael Fee, MD as Consulting Physician (Gastroenterology) Salvatore Marvel, MD as Consulting Physician (Orthopedic Surgery) Venancio Poisson, MD as Consulting Physician (Dermatology) Sheral Apley, MD as Attending Physician (Orthopedic Surgery)  Extended Emergency Contact Information Primary Emergency Contact: Cook,[name] Address: [address]          [place], [place] [address] [place] Home Phone: [phone] Work Phone: [phone] Mobile Phone: [phone] Relation: Son Secondary Emergency Contact: Switala,[name] Address: [address]          [place], [place] [address] [place] Home Phone: [phone] Relation: Relative  Code Status: DNR Goals of Care: Advanced Directive information Advanced Directives 06/08/2018  Does Patient Have a Medical Advance Directive? Yes  Type of Advance Directive Living will;Out of facility DNR (pink MOST or yellow form)  Does patient want to make changes to medical advance directive? No - Patient declined  Copy of Healthcare Power of Attorney in Chart? -  Pre-existing out of facility DNR order (yellow form or pink MOST form) Yellow form placed in chart (order not valid for inpatient use);Pink MOST form placed in chart (order not valid for inpatient use)      Chief Complaint  Patient presents with  . Medical Management of Chronic Issues    F/u- HTN, hypothyroidism, alzheimers, depression    HPI: Patient is a 84 y.o. female seen today for medical management of chronic problems including: Alzheimer's dementia hypothyroidism, and hypertension.  She is  in terminal phase of Alzheimer's and is followed by hospice.  She is minimally responsive.  Occasionally she will open her eyes and occasionally cries out.  She will not follow simple orders or answer questions.  She is total care for activities of daily living  Past Medical History:  Diagnosis Date  . Abnormality of gait 09/08/2003  . AF (atrial fibrillation) (HCC)   . Anxiety state, unspecified 08/19/2010  . Aortic stenosis   . Atrial fibrillation (HCC) 07/14/2009   Qualifier: Diagnosis of  By: [name], MD, Ruthann Cancer Ty Hilts   . Bradycardia   . Cardiac pacemaker in situ 09/12/2004  . Closed fracture of lumbar vertebra without mention of spinal cord injury 12020/04/1209  . Closed fracture of unspecified trochanteric section of femur 05/29/2011  . Constipation 03/14/2014  . Dementia (HCC) 2013   04/03/14 MMSE 22/30   . Depression 04/10/2014  . Diffuse cystic mastopathy 12/23/2009  . Disturbance of salivary secretion [id number]  . Edema 08/18/2003  . Fall 12/19/2012   07/22/14 fall VS 144/76, 60, 20, 97.1. No apparent injury   . Flatulence, eructation, and gas pain 12/03/2009  . Fracture of greater trochanter of left femur (HCC) 03/08/14  . FTT (failure to thrive) in adult 12/15/2014  . Hearing loss   . Hemorrhoids   . Hip fracture (HCC)   . HTN (hypertension)   . Hypothyroidism 12/19/2007   Qualifier: Diagnosis of  By: Melvyn Neth CMA (AAMA), Patty  01/16/14 TSH 2.474 03/24/14 TSH 2.576    . 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 Loss of weight 10/13/2014   Multiple factorials: dementia, depression, advanced age, possible  AR of medications(metformin and Oxybutynin)-will increase Mirtazapine and continue supplement. Observe.    . Macular degeneration (senile) of retina, unspecified 12/12/2001  . Macular degeneration of both eyes 01/24/2013   Severe visual impairment. Nearly blind.   . Mitral valve prolapse   . Occlusion and stenosis of carotid artery without  mention of cerebral infarction 12/27/2007  . Other and unspecified hyperlipidemia   . Other malaise and fatigue 12/06/2007  . Pacemaker-dual  medtronic 09/07/2011  . Personal history of fall 01/29/2009  . Rectal bleeding   . Senile osteoporosis 12/13/2000  . Sinus node dysfunction (HCC)   . Syncope and collapse 12/27/2007  . Trochanteric fracture of left femur (HCC) 03/14/2014   CT scan did however reveal acute fracture in greater left trochanteric. Dr. Margarita Rana consulted. Recommended nonoperative management and weightbearing as tolerated and another 3 weeks of Lovenox for DVT prophylaxis.       . Type 2 diabetes mellitus with blindness, with macular edema, with severe nonproliferative retinopathy 12/19/2007   Qualifier: Diagnosis of  By: Melvyn Neth CMA (AAMA), Patty  01/09/14 Hgb A1c 6.5 01/16/14 Hgb A1c 6.5 03/24/14 Hgb 6.4 12/10/14 pharm: dc Metformin and CBG monitoring-failure to thrive and refusing to eat.     Marland Kitchen Unspecified hereditary and idiopathic peripheral neuropathy 10/28/202012  . Unspecified hypothyroidism 03/18/2010  . Unspecified urinary incontinence 02/22/2005  . Urinary tract infection, site not specified 04/10/2014  . Xerophthalmus 01/21/2015   Past Surgical History:  Procedure Laterality Date  . BREAST BIOPSY Left 1970   benign  . Left hip surgery  09/2005  . PACEMAKER INSERTION  2006   Medtronic In-Pulse  . Right hip surgery  08/2003  . TONSILLECTOMY    . 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 She has never used smokeless tobacco. She reports that she does not drink alcohol or use drugs. Social History   Socioeconomic History  . Marital status: Widowed    Spouse name: Not on file  . Number of children: Not on file  . Years of education: Not on file  . Highest education level: Not on file  Occupational History  . Not on file  Social Needs  . Financial resource strain: Not  on file  . Food insecurity:    Worry: Not on file    Inability: Not on file  . Transportation needs:    Medical: Not on file    Non-medical: Not on file  Tobacco Use  . Smoking status: Never Smoker  . Smokeless tobacco: Never Used  Substance and Sexual Activity  . Alcohol use: No  . Drug use: No  . Sexual activity: Never  Lifestyle  . Physical activity:    Days per week: Not on file    Minutes per session: Not on file  . Stress: Not on file  Relationships  . Social connections:    Talks on phone: Not on file    Gets together: Not on file    Attends religious service: Not on file    Active member of club or organization: Not on file    Attends meetings of clubs or organizations: Not on file    Relationship status: Not on file  . Intimate partner violence:    Fear of current or ex partner: Not on file    Emotionally abused: Not on file    Physically abused: Not on file    Forced  sexual activity: Not on file  Other Topics Concern  . Not on file  Social History Narrative   Lives at [facility] since 2010 [place], transferred to memory care unit 04/29/14   Widowed   Never smoked   Alcohol none   Exercise none   DNR/ Living Will, MOST, POA      Ad[facility] to [facility] 08/05/2015    Functional Status Survey:    Family History  Problem Relation Age of Onset  . Colon cancer Mother   . Cancer Mother        colon  . Heart attack Father   . Heart disease Father        MI  . Heart disease Sister        MI    Health Maintenance  Topic Date Due  . PNA vac Low Risk Adult (2 of 2 - PCV13) 03/14/2010  . HEMOGLOBIN A1C  06/08/2017  . FOOT EXAM  12/05/2017  . URINE MICROALBUMIN  12/06/2017  . DEXA SCAN  01/23/2019 (Originally 07/27/1982)  . TETANUS/TDAP  03/15/2019  . INFLUENZA VACCINE  Completed  . OPHTHALMOLOGY EXAM  Discontinued    Allergies  Allergen Reactions  . Codeine     unknown  . Ibuprofen     unknown  . Lisinopril     unknown  . Penicillins      unknown  . [place] [Trospium Chloride]     unknown    Outpatient Encounter Medications as of 06/08/2018  Medication Sig  . acetaminophen (TYLENOL) 325 MG tablet Take 650 mg by mouth every 6 (six) hours as needed for mild pain or moderate pain.   Marland Kitchen atropine 1 % ophthalmic solution Place 4 drops under the tongue every 2 (two) hours as needed.   . bisacodyl (DULCOLAX) 10 MG suppository Place 10 mg rectally daily as needed for moderate constipation.   Marland Kitchen escitalopram (LEXAPRO) 10 MG tablet Take 10 mg by mouth daily.   . feeding supplement (BOOST / RESOURCE BREEZE) LIQD Take 1 Container by mouth 3 (three) times daily between meals.  Marland Kitchen LORazepam (ATIVAN) 0.5 MG tablet Take 0.5 mg by mouth daily. 12 PM  . LORazepam (ATIVAN) 1 MG tablet Take 0.5 mg by mouth daily. 4 PM   . magnesium hydroxide (MILK OF MAGNESIA) 400 MG/5ML suspension Take 30 mLs by mouth daily.  Marland Kitchen morphine 20 MG/5ML solution Take by mouth every 2 (two) hours as needed for pain. Give 0.89ml, sublingual  . sennosides-docusate sodium (SENOKOT-S) 8.6-50 MG tablet Take 2 tablets by mouth at bedtime. Reported on 12/11/2015   No facility-administered encounter medications on file as of 06/08/2018.     Review of Systems  Unable to perform ROS: Patient nonverbal    Vitals:   06/08/18 1036  BP: 140/60  Pulse: 64  Resp: 20  Temp: (!) 97 F (36.1 C)  SpO2: 98%  Weight: 89 lb 1.6 oz (40.4 kg)  Height: 5\' 2"  (1.575 m)   Body mass index is 16.3 kg/m. Physical Exam  HENT:  Head: Normocephalic.  Mouth/Throat: Oropharynx is clear and moist.  Cardiovascular: Normal rate and regular rhythm.  Pulmonary/Chest: Effort normal and breath sounds normal.  Abdominal: Soft. Bowel sounds are normal.  Neurological:  Patient does not respond to verbal stimuli  Psychiatric:  Unable to assess  Nursing note and vitals reviewed.   Labs reviewed: Basic Metabolic Panel: No results for input(s): NA, K, CL, CO2, GLUCOSE, BUN, CREATININE, CALCIUM,  MG, PHOS in the last 8760 hours.  Liver Function Tests: No results for input(s): AST, ALT, ALKPHOS, BILITOT, PROT, ALBUMIN in the last 8760 hours. No results for input(s): LIPASE, AMYLASE in the last 8760 hours. No results for input(s): AMMONIA in the last 8760 hours. CBC: No results for input(s): WBC, NEUTROABS, HGB, HCT, MCV, PLT in the last 8760 hours. Cardiac Enzymes: No results for input(s): CKTOTAL, CKMB, CKMBINDEX, TROPONINI in the last 8760 hours. BNP: Invalid input(s): POCBNP Lab Results  Component Value Date   HGBA1C 5.2 12/06/2016   Lab Results  Component Value Date   TSH 3.55 01/27/2015   Lab Results  Component Value Date   VITAMINB12 431 03/09/2014   No results found for: FOLATE No results found for: IRON, TIBC, FERRITIN  Imaging and Procedures obtained prior to SNF admission: Ct Abdomen Pelvis Wo Contrast  Result Date: 03/08/2014 CLINICAL DATA:  Low back pain.  Left hip pain. EXAM: CT ABDOMEN AND PELVIS WITHOUT CONTRAST TECHNIQUE: Multidetector CT imaging of the abdomen and pelvis was performed following the standard protocol without IV contrast. COMPARISON:  CT abdomen and pelvis 05/25/2011. Lumbar spine radiographs 03/08/2014. FINDINGS: The lung bases are clear without focal nodule, mass, or airspace disease. Coronary artery calcifications are present. Pacing wires are in place. Heart size is normal. No significant pleural or pericardial effusion is present. The liver and spleen are within normal limits. A small hiatal hernia is noted. Stomach, duodenum, and pancreas are otherwise unremarkable. The common bile duct and gallbladder are normal. The adrenal glands are normal bilaterally. The kidneys and ureters are within normal limits. The rectosigmoid colon is within normal limits. The remainder the colon is unremarkable. The appendix is not discretely visualized and may be surgically absent. Small bowel is within normal limits. Patient is status post right total hip  arthroplasty. ORIF for intertrochanteric fracture is noted on the left. A nondisplaced fracture is noted along the left greater trochanter. This is new since prior exam. This may be a chronic injury. A remote L5 compression fracture is present. No acute fracture or traumatic subluxation is present within the thoracolumbar spine. IMPRESSION: 1. Nondisplaced fracture along the left greater trochanter is new since prior exam. It is unclear if this is acute or chronic injury. 2. Postoperative changes of both hips. 3. Remote L5 compression fracture. 4. Extensive atherosclerotic changes including coronary artery disease. 5. Small hiatal hernia. 6. No acute intra-abdominal lesion to explain the patient's symptoms. Electronically Signed   By: Gennette Pac M.D.   On: 03/08/2014 12:40   Dg Chest 1 View  Result Date: 03/08/2014 CLINICAL DATA:  Fall. EXAM: CHEST - 1 VIEW COMPARISON:  05/25/2011 FINDINGS: Stable appearance of dual-chamber pacemaker. The heart size is stable and within normal limits. Lung volumes are low bilaterally. There is no evidence of pulmonary edema, consolidation, pneumothorax, nodule or pleural fluid. IMPRESSION: Low lung volumes.  No active disease. Electronically Signed   By: Irish Lack M.D.   On: 03/08/2014 09:39   Dg Lumbar Spine Complete  Result Date: 03/08/2014 CLINICAL DATA:  Fall on Thursday and yesterday. Back pain. Bilateral hip pain. EXAM: LUMBAR SPINE - COMPLETE 4+ VIEW COMPARISON:  03/08/2014 FINDINGS: There is chronic wedge compression fracture of L5, T11, and T10. No acute fracture identified. No suspicious lytic or blastic lesions are identified. There is moderate atherosclerotic calcification of the aorta and its branches. Patient has had ORIF of the left hip and right hip arthroplasty. IMPRESSION: 1. Chronic wedge compression fractures. 2.  No evidence for acute  abnormality. Electronically Signed  By: Rosalie Gums M.D.   On: 03/08/2014 09:51   Dg Pelvis 1-2  Views  Result Date: 03/08/2014 CLINICAL DATA:  Fall today. Hip pain. History of bilateral hip surgery and hip fracture. EXAM: PELVIS - 1-2 VIEW COMPARISON:  CT of the abdomen and pelvis on 05/25/2011 FINDINGS: Patient has had left hip ORIF and right hip arthroplasty. Bones appear radiolucent. There is no evidence for acute fracture or subluxation. Visualized bowel gas pattern is nonobstructive. There is atherosclerotic calcification of the femoral arteries. IMPRESSION: 1. Postoperative changes. 2.  No evidence for acute  abnormality. Electronically Signed   By: Rosalie Gums M.D.   On: 03/08/2014 09:36   Ct Head Wo Contrast  Result Date: 03/08/2014 CLINICAL DATA:  falls, posterior scalp hematoma.  Recent falls. EXAM: CT HEAD WITHOUT CONTRAST TECHNIQUE: Contiguous axial images were obtained from the base of the skull through the vertex without intravenous contrast. COMPARISON:  05/24/2011 FINDINGS: There is moderate central and cortical atrophy. Periventricular white matter changes are consistent with small vessel disease. There is no evidence for hemorrhage, mass lesion, or acute infarction. Right posterior parietal scalp hematoma is noted. There is no underlying calvarial fracture. Small fluid level is identified within the right sphenoid sinus. No sinus wall fracture identified. There is atherosclerotic calcification of the internal carotid arteries. IMPRESSION: 1. Atrophy and small vessel disease. 2.  No evidence for acute intracranial abnormality. 3. Right posterior parietal scalp hematoma without underlying calvarial fracture. 4. Sinusitis involving the right sphenoid air cell. No evidence for sinus wall fracture Electronically Signed   By: Rosalie Gums M.D.   On: 03/08/2014 09:25    Assessment/Plan 1. Essential hypertension Blood pressures are stable and managed.  She is not really on antihypertensive but gets lorazepam as well as morphine which probably help her blood pressure  2. Hypothyroidism due  to non-medication exogenous substances She does not longer receive thyroid supplement and last TSH was 3 years ago   Family/ staff Communication: Findings communicated to nursing staff  Labs/tests ordered: none  Bertram Millard. Hyacinth Meeker, MD [facility] [address] [place], [place] [address] Office [phone]

## 2018-06-11 DIAGNOSIS — F0151 Vascular dementia with behavioral disturbance: Secondary | ICD-10-CM | POA: Diagnosis not present

## 2018-06-11 DIAGNOSIS — E46 Unspecified protein-calorie malnutrition: Secondary | ICD-10-CM | POA: Diagnosis not present

## 2018-06-11 DIAGNOSIS — R630 Anorexia: Secondary | ICD-10-CM | POA: Diagnosis not present

## 2018-06-11 DIAGNOSIS — R634 Abnormal weight loss: Secondary | ICD-10-CM | POA: Diagnosis not present

## 2018-06-11 DIAGNOSIS — I495 Sick sinus syndrome: Secondary | ICD-10-CM | POA: Diagnosis not present

## 2018-06-11 DIAGNOSIS — I4891 Unspecified atrial fibrillation: Secondary | ICD-10-CM | POA: Diagnosis not present

## 2018-06-13 DIAGNOSIS — R630 Anorexia: Secondary | ICD-10-CM | POA: Diagnosis not present

## 2018-06-13 DIAGNOSIS — R634 Abnormal weight loss: Secondary | ICD-10-CM | POA: Diagnosis not present

## 2018-06-13 DIAGNOSIS — I4891 Unspecified atrial fibrillation: Secondary | ICD-10-CM | POA: Diagnosis not present

## 2018-06-13 DIAGNOSIS — F0151 Vascular dementia with behavioral disturbance: Secondary | ICD-10-CM | POA: Diagnosis not present

## 2018-06-13 DIAGNOSIS — E46 Unspecified protein-calorie malnutrition: Secondary | ICD-10-CM | POA: Diagnosis not present

## 2018-06-13 DIAGNOSIS — I495 Sick sinus syndrome: Secondary | ICD-10-CM | POA: Diagnosis not present

## 2018-06-14 DIAGNOSIS — I4891 Unspecified atrial fibrillation: Secondary | ICD-10-CM | POA: Diagnosis not present

## 2018-06-14 DIAGNOSIS — R630 Anorexia: Secondary | ICD-10-CM | POA: Diagnosis not present

## 2018-06-14 DIAGNOSIS — R634 Abnormal weight loss: Secondary | ICD-10-CM | POA: Diagnosis not present

## 2018-06-14 DIAGNOSIS — F0151 Vascular dementia with behavioral disturbance: Secondary | ICD-10-CM | POA: Diagnosis not present

## 2018-06-14 DIAGNOSIS — E46 Unspecified protein-calorie malnutrition: Secondary | ICD-10-CM | POA: Diagnosis not present

## 2018-06-14 DIAGNOSIS — I495 Sick sinus syndrome: Secondary | ICD-10-CM | POA: Diagnosis not present

## 2018-06-18 DIAGNOSIS — E46 Unspecified protein-calorie malnutrition: Secondary | ICD-10-CM | POA: Diagnosis not present

## 2018-06-18 DIAGNOSIS — I4891 Unspecified atrial fibrillation: Secondary | ICD-10-CM | POA: Diagnosis not present

## 2018-06-18 DIAGNOSIS — R630 Anorexia: Secondary | ICD-10-CM | POA: Diagnosis not present

## 2018-06-18 DIAGNOSIS — F0151 Vascular dementia with behavioral disturbance: Secondary | ICD-10-CM | POA: Diagnosis not present

## 2018-06-18 DIAGNOSIS — R634 Abnormal weight loss: Secondary | ICD-10-CM | POA: Diagnosis not present

## 2018-06-18 DIAGNOSIS — I495 Sick sinus syndrome: Secondary | ICD-10-CM | POA: Diagnosis not present

## 2018-06-20 DIAGNOSIS — F0151 Vascular dementia with behavioral disturbance: Secondary | ICD-10-CM | POA: Diagnosis not present

## 2018-06-20 DIAGNOSIS — E46 Unspecified protein-calorie malnutrition: Secondary | ICD-10-CM | POA: Diagnosis not present

## 2018-06-20 DIAGNOSIS — I495 Sick sinus syndrome: Secondary | ICD-10-CM | POA: Diagnosis not present

## 2018-06-20 DIAGNOSIS — R630 Anorexia: Secondary | ICD-10-CM | POA: Diagnosis not present

## 2018-06-20 DIAGNOSIS — R634 Abnormal weight loss: Secondary | ICD-10-CM | POA: Diagnosis not present

## 2018-06-20 DIAGNOSIS — I4891 Unspecified atrial fibrillation: Secondary | ICD-10-CM | POA: Diagnosis not present

## 2018-06-25 DIAGNOSIS — I495 Sick sinus syndrome: Secondary | ICD-10-CM | POA: Diagnosis not present

## 2018-06-25 DIAGNOSIS — F0151 Vascular dementia with behavioral disturbance: Secondary | ICD-10-CM | POA: Diagnosis not present

## 2018-06-25 DIAGNOSIS — I4891 Unspecified atrial fibrillation: Secondary | ICD-10-CM | POA: Diagnosis not present

## 2018-06-25 DIAGNOSIS — R634 Abnormal weight loss: Secondary | ICD-10-CM | POA: Diagnosis not present

## 2018-06-25 DIAGNOSIS — R630 Anorexia: Secondary | ICD-10-CM | POA: Diagnosis not present

## 2018-06-25 DIAGNOSIS — E46 Unspecified protein-calorie malnutrition: Secondary | ICD-10-CM | POA: Diagnosis not present

## 2018-06-27 DIAGNOSIS — I4891 Unspecified atrial fibrillation: Secondary | ICD-10-CM | POA: Diagnosis not present

## 2018-06-27 DIAGNOSIS — E46 Unspecified protein-calorie malnutrition: Secondary | ICD-10-CM | POA: Diagnosis not present

## 2018-06-27 DIAGNOSIS — R634 Abnormal weight loss: Secondary | ICD-10-CM | POA: Diagnosis not present

## 2018-06-27 DIAGNOSIS — I495 Sick sinus syndrome: Secondary | ICD-10-CM | POA: Diagnosis not present

## 2018-06-27 DIAGNOSIS — F0151 Vascular dementia with behavioral disturbance: Secondary | ICD-10-CM | POA: Diagnosis not present

## 2018-06-27 DIAGNOSIS — R630 Anorexia: Secondary | ICD-10-CM | POA: Diagnosis not present

## 2018-07-04 ENCOUNTER — Non-Acute Institutional Stay (SKILLED_NURSING_FACILITY): Payer: Medicare Other | Admitting: Nurse Practitioner

## 2018-07-04 ENCOUNTER — Encounter: Payer: Self-pay | Admitting: Nurse Practitioner

## 2018-07-04 DIAGNOSIS — G301 Alzheimer's disease with late onset: Secondary | ICD-10-CM

## 2018-07-04 DIAGNOSIS — R634 Abnormal weight loss: Secondary | ICD-10-CM | POA: Diagnosis not present

## 2018-07-04 DIAGNOSIS — F418 Other specified anxiety disorders: Secondary | ICD-10-CM | POA: Diagnosis not present

## 2018-07-04 DIAGNOSIS — K5901 Slow transit constipation: Secondary | ICD-10-CM | POA: Diagnosis not present

## 2018-07-04 DIAGNOSIS — F0281 Dementia in other diseases classified elsewhere with behavioral disturbance: Secondary | ICD-10-CM | POA: Diagnosis not present

## 2018-07-04 NOTE — Assessment & Plan Note (Signed)
Weights are stable. Continue supportive measures.

## 2018-07-04 NOTE — Assessment & Plan Note (Signed)
Continue Lorazepam 0.5mg  bid.

## 2018-07-04 NOTE — Assessment & Plan Note (Signed)
Stable, continue Senokot II qhs, MOM qd, prn Biscodyl 10mg  suppository qd.

## 2018-07-04 NOTE — Assessment & Plan Note (Signed)
Continue SNF [facility] for safety and care assistance, continue Morphine 5mg  q2hr prn, Tylenol 650mg  q6h prn,  Lorazepam 0.5mg  bid, Atropine ophthalmic sol prn SL. Continue Hospice service.

## 2018-07-04 NOTE — Progress Notes (Signed)
Location:  [facility] Room Number: 30 Place of Service:  SNF (31) Provider:  Anaelle Dunton, ManXie  NP  Frederica Kuster, MD  Patient Care Team: Frederica Kuster, MD as PCP - General (Family Medicine) Deriyah Kunath X, NP as Nurse Practitioner (Nurse Practitioner) [place] [place], [place] Lowella Bandy., MD as Consulting Physician (Urology) Rachael Fee, MD as Consulting Physician (Gastroenterology) Salvatore Marvel, MD as Consulting Physician (Orthopedic Surgery) Venancio Poisson, MD as Consulting Physician (Dermatology) Sheral Apley, MD as Attending Physician (Orthopedic Surgery)  Extended Emergency Contact Information Primary Emergency Contact: Cook,[name] Address: [address]          [place], [place] [address] Darden Amber of [place] Home Phone: [phone] Work Phone: [phone] Mobile Phone: [phone] Relation: Son Secondary Emergency Contact: Anfinson,[name] Address: [address]          [place], [place] [address] D[place] Amber of [place] Home Phone: [phone] Relation: Relative  Code Status:  DNR Goals of care: Advanced Directive information Advanced Directives 07/04/2018  Does Patient Have a Medical Advance Directive? Yes  Type of Advance Directive Living will;Out of facility DNR (pink MOST or yellow form)  Does patient want to make changes to medical advance directive? No - Patient declined  Copy of Healthcare Power of Attorney in Chart? -  Pre-existing out of facility DNR order (yellow form or pink MOST form) Yellow form placed in chart (order not valid for inpatient use);Pink MOST form placed in chart (order not valid for inpatient use)     Chief Complaint  Patient presents with  . Medical Management of Chronic Issues    F/U- HTN, hypothyroidism, Alzheimers, depression, anxiety, constipation    HPI:  Pt is a 84 y.o. female seen today for medical management of chronic diseases.    The patient resides in SNF [facility] for safety and care  assistance, total care of ADL, her goal of care is comfort measures, has prn Morphine 5mg  q2hr, Tylenol 650mg  q6h prn, Lorazepam 0.5mg  bid. under Hospice service. No constipation while on Senokot S II qhs, MOM qd, prn Bisacodyl 10mg  suppository daily.   Past Medical History:  Diagnosis Date  . Abnormality of gait 09/08/2003  . AF (atrial fibrillation) (HCC)   . Anxiety state, unspecified 08/19/2010  . Aortic stenosis   . Atrial fibrillation (HCC) 07/14/2009   Qualifier: Diagnosis of  By: [name], MD, Ruthann Cancer Ty Hilts   . Bradycardia   . Cardiac pacemaker in situ 09/12/2004  . Closed fracture of lumbar vertebra without mention of spinal cord injury 1Sep 15, 202010  . Closed fracture of unspecified trochanteric section of femur 05/29/2011  . Constipation 03/14/2014  . Dementia (HCC) 2013   04/03/14 MMSE 22/30   . Depression 04/10/2014  . Diffuse cystic mastopathy 12/23/2009  . Disturbance of salivary secretion [id number]  . Edema 08/18/2003  . Fall 12/19/2012   07/22/14 fall VS 144/76, 60, 20, 97.1. No apparent injury   . Flatulence, eructation, and gas pain 12/03/2009  . Fracture of greater trochanter of left femur (HCC) 03/08/14  . FTT (failure to thrive) in adult 12/15/2014  . Hearing loss   . Hemorrhoids   . Hip fracture (HCC)   . HTN (hypertension)   . Hypothyroidism 12/19/2007   Qualifier: Diagnosis of  By: Melvyn Neth CMA (AAMA), Patty  01/16/14 TSH 2.474 03/24/14 TSH 2.576    . 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 Loss of weight 10/13/2014   Multiple factorials:  dementia, depression, advanced age, possible AR of medications(metformin and Oxybutynin)-will increase Mirtazapine and continue supplement. Observe.    . Macular degeneration (senile) of retina, unspecified 12/12/2001  . Macular degeneration of both eyes 01/24/2013   Severe visual impairment. Nearly blind.   . Mitral valve prolapse   . Occlusion and stenosis of carotid artery without mention of  cerebral infarction 12/27/2007  . Other and unspecified hyperlipidemia   . Other malaise and fatigue 12/06/2007  . Pacemaker-dual  medtronic 09/07/2011  . Personal history of fall 01/29/2009  . Rectal bleeding   . Senile osteoporosis 12/13/2000  . Sinus node dysfunction (HCC)   . Syncope and collapse 12/27/2007  . Trochanteric fracture of left femur (HCC) 03/14/2014   CT scan did however reveal acute fracture in greater left trochanteric. Dr. Margarita Ranaimothy Murphy consulted. Recommended nonoperative management and weightbearing as tolerated and another 3 weeks of Lovenox for DVT prophylaxis.       . Type 2 diabetes mellitus with blindness, with macular edema, with severe nonproliferative retinopathy 12/19/2007   Qualifier: Diagnosis of  By: Melvyn NethLewis CMA (AAMA), Patty  01/09/14 Hgb A1c 6.5 01/16/14 Hgb A1c 6.5 03/24/14 Hgb 6.4 12/10/14 pharm: dc Metformin and CBG monitoring-failure to thrive and refusing to eat.     Marland Kitchen. Unspecified hereditary and idiopathic peripheral neuropathy 10/28/202012  . Unspecified hypothyroidism 03/18/2010  . Unspecified urinary incontinence 02/22/2005  . Urinary tract infection, site not specified 04/10/2014  . Xerophthalmus 01/21/2015   Past Surgical History:  Procedure Laterality Date  . BREAST BIOPSY Left 1970   benign  . Left hip surgery  09/2005  . PACEMAKER INSERTION  2006   Medtronic In-Pulse  . Right hip surgery  08/2003  . TONSILLECTOMY    . 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 Codeine     unknown  . Ibuprofen     unknown  . Lisinopril     unknown  . Penicillins     unknown  . Sanctura [Trospium Chloride]     unknown    Outpatient Encounter Medications as of 07/04/2018  Medication Sig  . acetaminophen (TYLENOL) 325 MG tablet Take 650 mg by mouth every 6 (six) hours as needed for mild pain or moderate pain.   Marland Kitchen. atropine 1 % ophthalmic solution Place 4 drops under  the tongue every 2 (two) hours as needed.   . bisacodyl (DULCOLAX) 10 MG suppository Place 10 mg rectally daily as needed for moderate constipation.   . feeding supplement (BOOST / RESOURCE BREEZE) LIQD Take 1 Container by mouth 3 (three) times daily between meals.  Marland Kitchen. LORazepam (ATIVAN) 0.5 MG tablet Take 0.5 mg by mouth daily. 12 PM  . LORazepam (ATIVAN) 1 MG tablet Take 0.5 mg by mouth daily. 4 PM   . magnesium hydroxide (MILK OF MAGNESIA) 400 MG/5ML suspension Take 30 mLs by mouth daily.  Marland Kitchen. morphine 20 MG/5ML solution Take by mouth every 2 (two) hours as needed for pain. Give 0.5925ml, sublingual  . sennosides-docusate sodium (SENOKOT-S) 8.6-50 MG tablet Take 2 tablets by mouth at bedtime. Reported on 12/11/2015  . [DISCONTINUED] escitalopram (LEXAPRO) 10 MG tablet Take 10 mg by mouth daily.    No facility-administered encounter medications on file as of 07/04/2018.    ROS was provided with assistance of staff Review of Systems  Constitutional: Negative for activity change, appetite change, chills, diaphoresis, fatigue, fever and unexpected weight change.  HENT: Positive for hearing loss and trouble swallowing. Negative for congestion.        Nectar thick liquid  Respiratory: Negative for cough, shortness of breath and wheezing.   Cardiovascular: Negative for chest pain, palpitations and leg swelling.  Gastrointestinal: Negative for abdominal distention, abdominal pain, constipation, diarrhea and nausea.  Genitourinary: Negative for difficulty urinating, dysuria and urgency.       Incontinent of urine  Musculoskeletal: Positive for arthralgias and gait problem.  Skin: Negative for color change and pallor.  Neurological: Negative for dizziness, speech difficulty, weakness and headaches.       Dementia, non sensible sentences.   Psychiatric/Behavioral: Positive for agitation, behavioral problems and confusion. Negative for hallucinations and sleep disturbance. The patient is not  nervous/anxious.     Immunization History  Administered Date(s) Administered  . Influenza Whole 05/10/2017, 05/03/2018  . Influenza-Unspecified 06/06/2013, 04/21/2015, 05/17/2016  . PPD Test 07/23/2011  . Pneumococcal Polysaccharide-23 03/14/2009  . Td 03/14/2009  . Tdap 03/14/2009  . Zoster 06/29/2006, 06/29/2006   Pertinent  Health Maintenance Due  Topic Date Due  . PNA vac Low Risk Adult (2 of 2 - PCV13) 03/14/2010  . HEMOGLOBIN A1C  06/08/2017  . FOOT EXAM  12/05/2017  . URINE MICROALBUMIN  12/06/2017  . DEXA SCAN  01/23/2019 (Originally 07/27/1982)  . INFLUENZA VACCINE  Completed  . OPHTHALMOLOGY EXAM  Discontinued   Fall Risk  04/03/2018 03/30/2017 01/26/2015 03/06/2014  Falls in the past year? No No Yes Yes  Number falls in past yr: - - 1 1  Comment - - - 03/06/14  Injury with Fall? - - Yes Yes  Comment - - - left hip   Risk Factor Category  - - - High Fall Risk  Risk for fall due to : - - History of fall(s);Impaired balance/gait;Impaired mobility -  Follow up - - Falls evaluation completed -   Functional Status Survey:    Vitals:   07/04/18 1149  BP: (!) 144/54  Pulse: (!) 58  Resp: 20  Temp: 98.2 F (36.8 C)  SpO2: 92%  Weight: 88 lb 14.4 oz (40.3 kg)  Height: 5\' 2"  (1.575 m)   Body mass index is 16.26 kg/m. Physical Exam  Constitutional: She appears well-developed and well-nourished.  HENT:  Head: Normocephalic and atraumatic.  Eyes: Pupils are equal, round, and reactive to light. EOM are normal.  Legally blind.   Neck: Normal range of motion. Neck supple. No JVD present. No thyromegaly present.  Cardiovascular: Normal rate and regular rhythm.  Murmur heard. Pacemaker.   Pulmonary/Chest: Effort normal. She has no wheezes. She has no rales.  Abdominal: Soft. She exhibits no distension. There is no tenderness. There is no rebound and no guarding.  Musculoskeletal: She exhibits no edema.  Assist with transfer, recliner once OOB  Neurological: She is  alert. No cranial nerve deficit. She exhibits normal muscle tone. Coordination normal.  Maybe oriented to self only.   Skin: Skin is warm and dry.  Psychiatric: She has a normal mood and affect.    Labs reviewed: No results for input(s): NA, K, CL, CO2, GLUCOSE, BUN, CREATININE, CALCIUM, MG, PHOS in the last 8760 hours. No results for input(s): AST, ALT, ALKPHOS, BILITOT, PROT, ALBUMIN in the last 8760 hours. No results for input(s): WBC, NEUTROABS, HGB, HCT, MCV, PLT in the last 8760 hours. Lab Results  Component Value Date   TSH 3.55 01/27/2015   Lab Results  Component Value Date   HGBA1C 5.2 12/06/2016  Lab Results  Component Value Date   CHOL 140 12/20/2012   HDL 38 12/20/2012   LDLCALC 57 12/20/2012   TRIG 227 (A) 12/20/2012   CHOLHDL 5.0 12/22/2007    Significant Diagnostic Results in last 30 days:  No results found.  Assessment/Plan Alzheimer's dementia with behavioral disturbance Continue SNF [facility] for safety and care assistance, continue Morphine 5mg  q2hr prn, Tylenol 650mg  q6h prn,  Lorazepam 0.5mg  bid, Atropine ophthalmic sol prn SL. Continue Hospice service.   Constipation Stable, continue Senokot II qhs, MOM qd, prn Biscodyl 10mg  suppository qd.   Loss of weight Weights are stable. Continue supportive measures.   Depression with anxiety Continue Lorazepam 0.5mg  bid.      Family/ staff Communication: plan of care reviewed with the patient and charge nurse.   Labs/tests ordered:  None  Time spend 25 minutes

## 2018-07-06 ENCOUNTER — Encounter: Payer: Self-pay | Admitting: Nurse Practitioner

## 2018-08-03 ENCOUNTER — Encounter: Payer: Self-pay | Admitting: Nurse Practitioner

## 2018-08-03 ENCOUNTER — Non-Acute Institutional Stay (SKILLED_NURSING_FACILITY): Payer: Medicare Other | Admitting: Nurse Practitioner

## 2018-08-03 DIAGNOSIS — F0281 Dementia in other diseases classified elsewhere with behavioral disturbance: Secondary | ICD-10-CM

## 2018-08-03 DIAGNOSIS — E43 Unspecified severe protein-calorie malnutrition: Secondary | ICD-10-CM

## 2018-08-03 DIAGNOSIS — M150 Primary generalized (osteo)arthritis: Secondary | ICD-10-CM | POA: Diagnosis not present

## 2018-08-03 DIAGNOSIS — K5901 Slow transit constipation: Secondary | ICD-10-CM | POA: Diagnosis not present

## 2018-08-03 DIAGNOSIS — M159 Polyosteoarthritis, unspecified: Secondary | ICD-10-CM

## 2018-08-03 DIAGNOSIS — M1990 Unspecified osteoarthritis, unspecified site: Secondary | ICD-10-CM | POA: Insufficient documentation

## 2018-08-03 DIAGNOSIS — G301 Alzheimer's disease with late onset: Secondary | ICD-10-CM | POA: Diagnosis not present

## 2018-08-03 NOTE — Assessment & Plan Note (Signed)
Continue supportive care, SNF [facility] for safety and care assistance. Continue Lorazepam 0.5mg  qd, 1mg  qd for behaviors.

## 2018-08-03 NOTE — Assessment & Plan Note (Signed)
Stable, continue Senokot S II qhs, prn Bisacodyl suppository 10mg  daily.

## 2018-08-03 NOTE — Assessment & Plan Note (Signed)
Continue prn Morphine 5mg  q2h for comfort measures.

## 2018-08-03 NOTE — Assessment & Plan Note (Signed)
Persisted with low body weight, BMI 16 kg/m2.

## 2018-08-03 NOTE — Progress Notes (Signed)
Location:  [facility] Room Number: 30 Place of Service:  SNF (31) Provider:  Kataleah Bejar, ManXie NP  Frederica Kuster, MD  Patient Care Team: Frederica Kuster, MD as PCP - General (Family Medicine) Hamp Moreland X, NP as Nurse Practitioner (Nurse Practitioner) [place] [place], [place] Lowella Bandy., MD as Consulting Physician (Urology) Rachael Fee, MD as Consulting Physician (Gastroenterology) Salvatore Marvel, MD as Consulting Physician (Orthopedic Surgery) Venancio Poisson, MD as Consulting Physician (Dermatology) Sheral Apley, MD as Attending Physician (Orthopedic Surgery)  Extended Emergency Contact Information Primary Emergency Contact: Cook,[name] Address: [address]          [place], [place] [address] [place] of [place] Home Phone: [phone] Work Phone: [phone] Mobile Phone: [phone] Relation: Son Secondary Emergency Contact: Wessler,[name] Address: [address]          [place], [place] [address] [place] of [place] Home Phone: [phone] Relation: Relative  Code Status:  DNR Goals of care: Advanced Directive information Advanced Directives 08/03/2018  Does Patient Have a Medical Advance Directive? Yes  Type of Advance Directive Living will;Out of facility DNR (pink MOST or yellow form)  Does patient want to make changes to medical advance directive? No - Patient declined  Copy of Healthcare Power of Attorney in Chart? -  Pre-existing out of facility DNR order (yellow form or pink MOST form) Yellow form placed in chart (order not valid for inpatient use);Pink MOST form placed in chart (order not valid for inpatient use)     Chief Complaint  Patient presents with  . Medical Management of Chronic Issues    HPI:  Pt is a 85 y.o. female seen today for medical management of chronic diseases.     The patient has history of calorie protein malnutrition, low body weight in 80s, under hospice service. Prn q2hr Morphine 5mg   Lorazepam 0.5mg  qd, 1mg  qd. No constipation while on Senokot S II qd, prn Bisacodyl 10mg  suppository daily.   Past Medical History:  Diagnosis Date  . Abnormality of gait 09/08/2003  . AF (atrial fibrillation) (HCC)   . Anxiety state, unspecified 08/19/2010  . Aortic stenosis   . Atrial fibrillation (HCC) 07/14/2009   Qualifier: Diagnosis of  By: [name], MD, Ruthann Cancer Ty Hilts   . Bradycardia   . Cardiac pacemaker in situ 09/12/2004  . Closed fracture of lumbar vertebra without mention of spinal cord injury 101/24/202010  . Closed fracture of unspecified trochanteric section of femur 05/29/2011  . Constipation 03/14/2014  . Dementia (HCC) 2013   04/03/14 MMSE 22/30   . Depression 04/10/2014  . Diffuse cystic mastopathy 12/23/2009  . Disturbance of salivary secretion [id number]  . Edema 08/18/2003  . Fall 12/19/2012   07/22/14 fall VS 144/76, 60, 20, 97.1. No apparent injury   . Flatulence, eructation, and gas pain 12/03/2009  . Fracture of greater trochanter of left femur (HCC) 03/08/14  . FTT (failure to thrive) in adult 12/15/2014  . Hearing loss   . Hemorrhoids   . Hip fracture (HCC)   . HTN (hypertension)   . Hypothyroidism 12/19/2007   Qualifier: Diagnosis of  By: Melvyn Neth CMA (AAMA), Patty  01/16/14 TSH 2.474 03/24/14 TSH 2.576    . 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 Loss of weight 10/13/2014   Multiple factorials: dementia, depression, advanced age, possible AR of medications(metformin and Oxybutynin)-will increase Mirtazapine and continue supplement. Observe.    . Macular degeneration (senile) of  retina, unspecified 12/12/2001  . Macular degeneration of both eyes 01/24/2013   Severe visual impairment. Nearly blind.   . Mitral valve prolapse   . Occlusion and stenosis of carotid artery without mention of cerebral infarction 12/27/2007  . Other and unspecified hyperlipidemia   . Other malaise and fatigue 12/06/2007  . Pacemaker-dual  medtronic  09/07/2011  . Personal history of fall 01/29/2009  . Rectal bleeding   . Senile osteoporosis 12/13/2000  . Sinus node dysfunction (HCC)   . Syncope and collapse 12/27/2007  . Trochanteric fracture of left femur (HCC) 03/14/2014   CT scan did however reveal acute fracture in greater left trochanteric. Dr. Margarita Rana consulted. Recommended nonoperative management and weightbearing as tolerated and another 3 weeks of Lovenox for DVT prophylaxis.       . Type 2 diabetes mellitus with blindness, with macular edema, with severe nonproliferative retinopathy 12/19/2007   Qualifier: Diagnosis of  By: Melvyn Neth CMA (AAMA), Patty  01/09/14 Hgb A1c 6.5 01/16/14 Hgb A1c 6.5 03/24/14 Hgb 6.4 12/10/14 pharm: dc Metformin and CBG monitoring-failure to thrive and refusing to eat.     Marland Kitchen Unspecified hereditary and idiopathic peripheral neuropathy 10/28/202012  . Unspecified hypothyroidism 03/18/2010  . Unspecified urinary incontinence 02/22/2005  . Urinary tract infection, site not specified 04/10/2014  . Xerophthalmus 01/21/2015   Past Surgical History:  Procedure Laterality Date  . BREAST BIOPSY Left 1970   benign  . Left hip surgery  09/2005  . PACEMAKER INSERTION  2006   Medtronic In-Pulse  . Right hip surgery  08/2003  . TONSILLECTOMY    . 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 Codeine     unknown  . Ibuprofen     unknown  . Lisinopril     unknown  . Penicillins     unknown  . Sanctura [Trospium Chloride]     unknown    Outpatient Encounter Medications as of 08/03/2018  Medication Sig  . acetaminophen (TYLENOL) 325 MG tablet Take 650 mg by mouth every 6 (six) hours as needed for mild pain or moderate pain.   . bisacodyl (DULCOLAX) 10 MG suppository Place 10 mg rectally daily as needed for moderate constipation.   . feeding supplement (BOOST / RESOURCE BREEZE) LIQD Take 1 Container by mouth 3 (three)  times daily between meals.  Marland Kitchen LORazepam (ATIVAN) 0.5 MG tablet Take 0.5 mg by mouth daily. 12 PM  . LORazepam (ATIVAN) 1 MG tablet Take 0.5 mg by mouth daily. 4 PM   . magnesium hydroxide (MILK OF MAGNESIA) 400 MG/5ML suspension Take 30 mLs by mouth daily.  Marland Kitchen morphine 20 MG/5ML solution Take by mouth every 2 (two) hours as needed for pain. Give 0.22ml, sublingual  . sennosides-docusate sodium (SENOKOT-S) 8.6-50 MG tablet Take 2 tablets by mouth at bedtime. Reported on 12/11/2015  . [DISCONTINUED] atropine 1 % ophthalmic solution Place 4 drops under the tongue every 2 (two) hours as needed.    No facility-administered encounter medications on file as of 08/03/2018.    ROS was provided with assistance of staff Review of Systems  Constitutional: Negative for activity change, appetite change, chills, diaphoresis, fatigue, fever and unexpected weight change.  HENT: Positive for hearing loss and trouble swallowing. Negative for congestion and voice change.        Nectar thick liquid.   Eyes: Positive for visual disturbance.       Low  vision.   Respiratory: Negative for cough and shortness of breath.   Cardiovascular: Negative for chest pain, palpitations and leg swelling.  Gastrointestinal: Negative for abdominal distention, abdominal pain, constipation, diarrhea, nausea and vomiting.  Genitourinary: Negative for difficulty urinating, dysuria and urgency.  Musculoskeletal: Positive for arthralgias.       Non ambulatory.   Skin: Negative for color change and pallor.  Neurological: Negative for dizziness, weakness and headaches.       Dementia, no sensible sentences.   Psychiatric/Behavioral: Positive for agitation, behavioral problems and confusion. Negative for hallucinations and sleep disturbance. The patient is nervous/anxious.     Immunization History  Administered Date(s) Administered  . Influenza Whole 05/10/2017, 05/03/2018  . Influenza-Unspecified 06/06/2013, 04/21/2015, 05/17/2016  .  PPD Test 07/23/2011  . Pneumococcal Polysaccharide-23 03/14/2009  . Td 03/14/2009  . Tdap 03/14/2009  . Zoster 06/29/2006, 06/29/2006   Pertinent  Health Maintenance Due  Topic Date Due  . PNA vac Low Risk Adult (2 of 2 - PCV13) 03/14/2010  . HEMOGLOBIN A1C  06/08/2017  . FOOT EXAM  12/05/2017  . URINE MICROALBUMIN  12/06/2017  . DEXA SCAN  01/23/2019 (Originally 07/27/1982)  . INFLUENZA VACCINE  Completed  . OPHTHALMOLOGY EXAM  Discontinued   Fall Risk  04/03/2018 03/30/2017 01/26/2015 03/06/2014  Falls in the past year? No No Yes Yes  Number falls in past yr: - - 1 1  Comment - - - 03/06/14  Injury with Fall? - - Yes Yes  Comment - - - left hip   Risk Factor Category  - - - High Fall Risk  Risk for fall due to : - - History of fall(s);Impaired balance/gait;Impaired mobility -  Follow up - - Falls evaluation completed -   Functional Status Survey:    Vitals:   08/03/18 1037  BP: 140/70  Pulse: 60  Resp: 18  Temp: 98.2 F (36.8 C)  SpO2: 93%  Weight: 85 lb 8 oz (38.8 kg)  Height: 5\' 2"  (1.575 m)   Body mass index is 15.64 kg/m. Physical Exam Constitutional:      General: She is not in acute distress.    Appearance: Normal appearance. She is not ill-appearing, toxic-appearing or diaphoretic.     Comments: Low weight  HENT:     Head: Normocephalic and atraumatic.     Nose: Nose normal.     Mouth/Throat:     Mouth: Mucous membranes are moist.  Eyes:     Extraocular Movements: Extraocular movements intact.     Conjunctiva/sclera: Conjunctivae normal.     Pupils: Pupils are equal, round, and reactive to light.     Comments: Legally blind.   Neck:     Musculoskeletal: Normal range of motion and neck supple.  Cardiovascular:     Rate and Rhythm: Normal rate and regular rhythm.     Heart sounds: Murmur present.     Comments: Pacemaker.  Pulmonary:     Effort: Pulmonary effort is normal.     Breath sounds: Normal breath sounds. No wheezing, rhonchi or rales.    Abdominal:     General: There is no distension.     Palpations: Abdomen is soft.     Tenderness: There is no abdominal tenderness. There is no guarding or rebound.  Musculoskeletal:     Right lower leg: No edema.     Left lower leg: No edema.     Comments: No ambulatory.  Skin:    General: Skin is warm and dry.  Neurological:  General: No focal deficit present.     Mental Status: She is alert. Mental status is at baseline.     Cranial Nerves: No cranial nerve deficit.     Motor: No weakness.     Coordination: Coordination normal.     Comments: Oriented to self.   Psychiatric:        Mood and Affect: Mood normal.     Comments: Upon my visit.      Labs reviewed: No results for input(s): NA, K, CL, CO2, GLUCOSE, BUN, CREATININE, CALCIUM, MG, PHOS in the last 8760 hours. No results for input(s): AST, ALT, ALKPHOS, BILITOT, PROT, ALBUMIN in the last 8760 hours. No results for input(s): WBC, NEUTROABS, HGB, HCT, MCV, PLT in the last 8760 hours. Lab Results  Component Value Date   TSH 3.55 01/27/2015   Lab Results  Component Value Date   HGBA1C 5.2 12/06/2016   Lab Results  Component Value Date   CHOL 140 12/20/2012   HDL 38 12/20/2012   LDLCALC 57 12/20/2012   TRIG 227 (A) 12/20/2012   CHOLHDL 5.0 12/22/2007    Significant Diagnostic Results in last 30 days:  No results found.  Assessment/Plan Alzheimer's dementia with behavioral disturbance Continue supportive care, SNF [facility] for safety and care assistance. Continue Lorazepam 0.5mg  qd, 1mg  qd for behaviors.   Severe protein-calorie malnutrition (HCC) Persisted with low body weight, BMI 16 kg/m2.   Constipation Stable, continue Senokot S II qhs, prn Bisacodyl suppository 10mg  daily.   OA (osteoarthritis) Continue prn Morphine 5mg  q2h for comfort measures.      Family/ staff Communication: plan of care reviewed with the patient and charge nurse.   Labs/tests ordered:  none  Time spend 25 minutes.

## 2018-08-13 ENCOUNTER — Non-Acute Institutional Stay (SKILLED_NURSING_FACILITY): Payer: Medicare Other | Admitting: Nurse Practitioner

## 2018-08-13 ENCOUNTER — Encounter: Payer: Self-pay | Admitting: Nurse Practitioner

## 2018-08-13 DIAGNOSIS — G301 Alzheimer's disease with late onset: Secondary | ICD-10-CM | POA: Diagnosis not present

## 2018-08-13 DIAGNOSIS — F02818 Dementia in other diseases classified elsewhere, unspecified severity, with other behavioral disturbance: Secondary | ICD-10-CM

## 2018-08-13 DIAGNOSIS — R1310 Dysphagia, unspecified: Secondary | ICD-10-CM | POA: Diagnosis not present

## 2018-08-13 DIAGNOSIS — F0281 Dementia in other diseases classified elsewhere with behavioral disturbance: Secondary | ICD-10-CM | POA: Diagnosis not present

## 2018-08-13 DIAGNOSIS — J069 Acute upper respiratory infection, unspecified: Secondary | ICD-10-CM | POA: Diagnosis not present

## 2018-08-13 NOTE — Assessment & Plan Note (Signed)
Continue nectar thick liquid, risk for aspiration, observe.

## 2018-08-13 NOTE — Assessment & Plan Note (Signed)
Continue SNF [facility] for safety and care assistance.  

## 2018-08-13 NOTE — Assessment & Plan Note (Signed)
Claritin 10mg  qd, Robafen ac 55ml tid po x 1week, VS and Sat O2 q shift x 72hrs. Observe.

## 2018-08-13 NOTE — Progress Notes (Signed)
[facility]:  [facility] Room Number: 30 Place of Service:  SNF (31) Provider:  Mast, Arna SnipeManXie  NP  Mahlon GammonGupta, Anjali L, MD  Patient Care Team: Mahlon GammonGupta, Anjali L, MD as PCP - General (Internal Medicine) Mast, Man X, NP as Nurse Practitioner (Nurse Practitioner) [facility], [place] Lowella BandyM Jr., MD as Consulting Physician (Urology) Rachael FeeJacobs, Daniel P, MD as Consulting Physician (Gastroenterology) Salvatore MarvelWainer, Robert, MD as Consulting Physician (Orthopedic Surgery) Venancio PoissonLomax, Laura, MD as Consulting Physician (Dermatology) Sheral ApleyMurphy, Timothy D, MD as Attending Physician (Orthopedic Surgery)  Extended Emergency Contact Information Primary Emergency Contact: Cook,[name] Address: [address]          [place], [place] [address] [place] Home Phone: [phone] Work Phone: [phone] Mobile Phone: [phone] Relation: Son Secondary Emergency Contact: Haile,[name] Address: [address]          [place], [place] [address] [place] Home Phone: [phone] Relation: Relative  Code Status:  DNR Goals of care: Advanced Directive information Advanced Directives 08/03/2018  Does Patient Have a Medical Advance Directive? Yes  Type of Advance Directive Living will;Out of facility DNR (pink MOST or yellow form)  Does patient want to make changes to medical advance directive? No - Patient declined  Copy of Healthcare Power of Attorney in Chart? -  Pre-existing out of facility DNR order (yellow form or pink MOST form) Yellow form placed in chart (order not valid for inpatient use);Pink MOST form placed in chart (order not valid for inpatient use)     Chief Complaint  Patient presents with  . Acute Visit    C.o-coughing up thick white secretions    HPI:  Pt is a [age] y.o. female seen today for an acute visit for congested cough, thick white mucus production. She is afebrile, no O2 desaturation. No noted facial/sinus pressure/pain, chest pain, SOB, or  palpitation. HPI was provided with assistance of staff. Dysphagia, managed with nectar thick liquid.   Past Medical History:  Diagnosis Date  . Abnormality of gait 09/08/2003  . AF (atrial fibrillation) (HCC)   . Anxiety state, unspecified 08/19/2010  . Aortic stenosis   . Atrial fibrillation (HCC) 07/14/2009   Qualifier: Diagnosis of  By: Graciela HusbandsKlein, MD, Ruthann CancerFACC, Ty HiltsSteven Cochran   . Bradycardia   . Cardiac pacemaker in situ 09/12/2004  . Closed fracture of lumbar vertebra without mention of spinal cord injury 110/26/202010  . Closed fracture of unspecified trochanteric section of femur 05/29/2011  . Constipation 03/14/2014  . Dementia (HCC) 2013   04/03/14 MMSE 22/30   . Depression 04/10/2014  . Diffuse cystic mastopathy 12/23/2009  . Disturbance of salivary secretion [id number]  . Edema 08/18/2003  . Fall 12/19/2012   07/22/14 fall VS 144/76, 60, 20, 97.1. No apparent injury   . Flatulence, eructation, and gas pain 12/03/2009  . Fracture of greater trochanter of left femur (HCC) 03/08/14  . FTT (failure to thrive) in adult 12/15/2014  . Hearing loss   . Hemorrhoids   . Hip fracture (HCC)   . HTN (hypertension)   . Hypothyroidism 12/19/2007   Qualifier: Diagnosis of  By: Melvyn NethLewis CMA (AAMA), Patty  01/16/14 TSH 2.474 03/24/14 TSH 2.576    . 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 Loss of weight 10/13/2014   Multiple factorials: dementia, depression, advanced age, possible AR of medications(metformin and Oxybutynin)-will increase Mirtazapine and continue supplement. Observe.    . Macular degeneration (senile) of retina, unspecified 12/12/2001  .  Macular degeneration of both eyes 01/24/2013   Severe visual impairment. Nearly blind.   . Mitral valve prolapse   . Occlusion and stenosis of carotid artery without mention of cerebral infarction 12/27/2007  . Other and unspecified hyperlipidemia   . Other malaise and fatigue 12/06/2007  . Pacemaker-dual  medtronic 09/07/2011  .  Personal history of fall 01/29/2009  . Rectal bleeding   . Senile osteoporosis 12/13/2000  . Sinus node dysfunction (HCC)   . Syncope and collapse 12/27/2007  . Trochanteric fracture of left femur (HCC) 03/14/2014   CT scan did however reveal acute fracture in greater left trochanteric. Dr. Margarita Rana consulted. Recommended nonoperative management and weightbearing as tolerated and another 3 weeks of Lovenox for DVT prophylaxis.       . Type 2 diabetes mellitus with blindness, with macular edema, with severe nonproliferative retinopathy 12/19/2007   Qualifier: Diagnosis of  By: Melvyn Neth CMA (AAMA), Patty  01/09/14 Hgb A1c 6.5 01/16/14 Hgb A1c 6.5 03/24/14 Hgb 6.4 12/10/14 pharm: dc Metformin and CBG monitoring-failure to thrive and refusing to eat.     Marland Kitchen Unspecified hereditary and idiopathic peripheral neuropathy 10/28/202012  . Unspecified hypothyroidism 03/18/2010  . Unspecified urinary incontinence 02/22/2005  . Urinary tract infection, site not specified 04/10/2014  . Xerophthalmus 01/21/2015   Past Surgical History:  Procedure Laterality Date  . BREAST BIOPSY Left 1970   benign  . Left hip surgery  09/2005  . PACEMAKER INSERTION  2006   Medtronic In-Pulse  . Right hip surgery  08/2003  . TONSILLECTOMY    . 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 Codeine     unknown  . Ibuprofen     unknown  . Lisinopril     unknown  . Penicillins     unknown  . Sanctura [Trospium Chloride]     unknown    Outpatient Encounter Medications as of 08/13/2018  Medication Sig  . acetaminophen (TYLENOL) 325 MG tablet Take 650 mg by mouth every 6 (six) hours as needed for mild pain or moderate pain.   . bisacodyl (DULCOLAX) 10 MG suppository Place 10 mg rectally daily as needed for moderate constipation.   . feeding supplement (BOOST / RESOURCE BREEZE) LIQD Take 1 Container by mouth 3 (three) times daily  between meals.  Marland Kitchen LORazepam (ATIVAN) 0.5 MG tablet Take 0.5 mg by mouth daily. 12 PM  . LORazepam (ATIVAN) 1 MG tablet Take 0.5 mg by mouth daily. 4 PM   . magnesium hydroxide (MILK OF MAGNESIA) 400 MG/5ML suspension Take 30 mLs by mouth daily.  Marland Kitchen morphine 20 MG/5ML solution Take by mouth every 2 (two) hours as needed for pain. Give 0.67ml, sublingual  . sennosides-docusate sodium (SENOKOT-S) 8.6-50 MG tablet Take 2 tablets by mouth at bedtime. Reported on 12/11/2015   No facility-administered encounter medications on file as of 08/13/2018.    ROS was provided with assistance of staff Review of Systems  Constitutional: Negative for activity change, appetite change, chills, diaphoresis, fatigue and fever.  HENT: Positive for congestion, postnasal drip, rhinorrhea and trouble swallowing. Negative for hearing loss, sinus pain, sore throat and voice change.        Nectar thick liquids.   Respiratory: Positive for cough. Negative for apnea, choking, chest tightness, shortness of breath and wheezing.   Cardiovascular: Negative for chest pain, palpitations and leg swelling.  Neurological: Negative for dizziness, speech difficulty, weakness  and headaches.       Dementia  Psychiatric/Behavioral: Positive for confusion. Negative for agitation, behavioral problems, hallucinations and sleep disturbance. The patient is not nervous/anxious.     Immunization History  Administered Date(s) Administered  . Influenza Whole 05/10/2017, 05/03/2018  . Influenza-Unspecified 06/06/2013, 04/21/2015, 05/17/2016  . PPD Test 07/23/2011  . Pneumococcal Polysaccharide-23 03/14/2009  . Td 03/14/2009  . Tdap 03/14/2009  . Zoster 06/29/2006, 06/29/2006   Pertinent  Health Maintenance Due  Topic Date Due  . PNA vac Low Risk Adult (2 of 2 - PCV13) 03/14/2010  . HEMOGLOBIN A1C  06/08/2017  . FOOT EXAM  12/05/2017  . URINE MICROALBUMIN  12/06/2017  . DEXA SCAN  01/23/2019 (Originally 07/27/1982)  . INFLUENZA VACCINE   Completed  . OPHTHALMOLOGY EXAM  Discontinued   Fall Risk  04/03/2018 03/30/2017 01/26/2015 03/06/2014  Falls in the past year? No No Yes Yes  Number falls in past yr: - - 1 1  Comment - - - 03/06/14  Injury with Fall? - - Yes Yes  Comment - - - left hip   Risk Factor Category  - - - High Fall Risk  Risk for fall due to : - - History of fall(s);Impaired balance/gait;Impaired mobility -  Follow up - - Falls evaluation completed -   Functional Status Survey:    Vitals:   08/13/18 1327  BP: 100/60  Pulse: 62  Resp: 20  Temp: 97.6 F (36.4 C)  SpO2: 96%  Weight: 85 lb 8 oz (38.8 kg)  Height: 5\' 2"  (1.575 m)   Body mass index is 15.64 kg/m. Physical Exam Constitutional:      General: She is not in acute distress.    Appearance: Normal appearance. She is not ill-appearing, toxic-appearing or diaphoretic.  HENT:     Head: Normocephalic and atraumatic.     Nose: Congestion and rhinorrhea present.     Mouth/Throat:     Mouth: Mucous membranes are moist.     Pharynx: No oropharyngeal exudate or posterior oropharyngeal erythema.  Eyes:     Extraocular Movements: Extraocular movements intact.     Conjunctiva/sclera: Conjunctivae normal.     Pupils: Pupils are equal, round, and reactive to light.  Neck:     Musculoskeletal: Normal range of motion and neck supple.  Cardiovascular:     Rate and Rhythm: Normal rate and regular rhythm.     Heart sounds: Murmur present.  Pulmonary:     Effort: Pulmonary effort is normal.     Breath sounds: Normal breath sounds. No wheezing, rhonchi or rales.  Musculoskeletal:     Comments: Seen in recliner.   Skin:    General: Skin is warm and dry.  Neurological:     General: No focal deficit present.     Mental Status: She is alert. Mental status is at baseline.     Cranial Nerves: No cranial nerve deficit.     Motor: No weakness.     Coordination: Coordination normal.     Gait: Gait abnormal.     Comments: Oriented to self.   Psychiatric:         Mood and Affect: Mood normal.     Labs reviewed: No results for input(s): NA, K, CL, CO2, GLUCOSE, BUN, CREATININE, CALCIUM, MG, PHOS in the last 8760 hours. No results for input(s): AST, ALT, ALKPHOS, BILITOT, PROT, ALBUMIN in the last 8760 hours. No results for input(s): WBC, NEUTROABS, HGB, HCT, MCV, PLT in the last 8760 hours. Lab Results  Component Value Date   TSH 3.55 01/27/2015   Lab Results  Component Value Date   HGBA1C 5.2 12/06/2016   Lab Results  Component Value Date   CHOL 140 12/20/2012   HDL 38 12/20/2012   LDLCALC 57 12/20/2012   TRIG 227 (A) 12/20/2012   CHOLHDL 5.0 12/22/2007    Significant Diagnostic Results in last 30 days:  No results found.  Assessment/Plan URI (upper respiratory infection) Claritin 10mg  qd, Robafen ac 10ml tid po x 1week, VS and Sat O2 q shift x 72hrs. Observe.   Alzheimer's dementia with behavioral disturbance Continue SNF [facility] for safety and care assistance.   Dysphagia Continue nectar thick liquid, risk for aspiration, observe.      Family/ staff Communication: plan of care reviewed with the patient and charge nurse.   Labs/tests ordered:  none  Time spend 25 minutes.

## 2018-08-23 DIAGNOSIS — Z029 Encounter for administrative examinations, unspecified: Secondary | ICD-10-CM

## 2018-09-01 DEATH — deceased
